# Patient Record
Sex: Female | Born: 1960 | Race: Black or African American | Hispanic: No | Marital: Single | Smoking: Current every day smoker
Health system: Southern US, Community
[De-identification: ages and names within clinical notes are randomized; demographics above are authoritative.]

## PROBLEM LIST (undated history)

## (undated) DIAGNOSIS — M069 Rheumatoid arthritis, unspecified: Secondary | ICD-10-CM

## (undated) DIAGNOSIS — M1990 Unspecified osteoarthritis, unspecified site: Secondary | ICD-10-CM

## (undated) DIAGNOSIS — K219 Gastro-esophageal reflux disease without esophagitis: Secondary | ICD-10-CM

## (undated) DIAGNOSIS — M549 Dorsalgia, unspecified: Secondary | ICD-10-CM

## (undated) DIAGNOSIS — J309 Allergic rhinitis, unspecified: Secondary | ICD-10-CM

## (undated) DIAGNOSIS — G8929 Other chronic pain: Secondary | ICD-10-CM

## (undated) HISTORY — PX: BACK SURGERY: SHX140

## (undated) HISTORY — PX: ABDOMINAL HYSTERECTOMY: SHX81

---

## 2016-02-02 ENCOUNTER — Encounter (HOSPITAL_BASED_OUTPATIENT_CLINIC_OR_DEPARTMENT_OTHER): Payer: Self-pay | Admitting: *Deleted

## 2016-02-02 ENCOUNTER — Emergency Department (HOSPITAL_BASED_OUTPATIENT_CLINIC_OR_DEPARTMENT_OTHER): Payer: Medicare Other

## 2016-02-02 ENCOUNTER — Emergency Department (HOSPITAL_BASED_OUTPATIENT_CLINIC_OR_DEPARTMENT_OTHER)
Admission: EM | Admit: 2016-02-02 | Discharge: 2016-02-02 | Disposition: A | Discharge status: Home / Self Care | Payer: Medicare Other | Attending: Emergency Medicine | Admitting: Emergency Medicine

## 2016-02-02 DIAGNOSIS — T402X5A Adverse effect of other opioids, initial encounter: Secondary | ICD-10-CM

## 2016-02-02 DIAGNOSIS — F17210 Nicotine dependence, cigarettes, uncomplicated: Secondary | ICD-10-CM | POA: Insufficient documentation

## 2016-02-02 DIAGNOSIS — K5909 Other constipation: Secondary | ICD-10-CM | POA: Insufficient documentation

## 2016-02-02 DIAGNOSIS — K5903 Drug induced constipation: Secondary | ICD-10-CM

## 2016-02-02 DIAGNOSIS — Z79899 Other long term (current) drug therapy: Secondary | ICD-10-CM | POA: Insufficient documentation

## 2016-02-02 HISTORY — DX: Dorsalgia, unspecified: M54.9

## 2016-02-02 HISTORY — DX: Unspecified osteoarthritis, unspecified site: M19.90

## 2016-02-02 HISTORY — DX: Other chronic pain: G89.29

## 2016-02-02 LAB — RAPID URINE DRUG SCREEN, HOSP PERFORMED
AMPHETAMINES: NOT DETECTED
Barbiturates: NOT DETECTED
Benzodiazepines: NOT DETECTED
Cocaine: NOT DETECTED
Opiates: POSITIVE — AB
TETRAHYDROCANNABINOL: NOT DETECTED

## 2016-02-02 LAB — COMPREHENSIVE METABOLIC PANEL
ALK PHOS: 107 U/L (ref 38–126)
ALT: 13 U/L — AB (ref 14–54)
AST: 20 U/L (ref 15–41)
Albumin: 4.2 g/dL (ref 3.5–5.0)
Anion gap: 8 (ref 5–15)
BUN: 9 mg/dL (ref 6–20)
CHLORIDE: 105 mmol/L (ref 101–111)
CO2: 24 mmol/L (ref 22–32)
CREATININE: 0.79 mg/dL (ref 0.44–1.00)
Calcium: 9.4 mg/dL (ref 8.9–10.3)
GFR calc Af Amer: >60 mL/min (ref >60)
Glucose, Bld: 136 mg/dL — ABNORMAL HIGH (ref 65–99)
Potassium: 3.8 mmol/L (ref 3.5–5.1)
Sodium: 137 mmol/L (ref 135–145)
Total Bilirubin: 0.4 mg/dL (ref 0.3–1.2)
Total Protein: 7.8 g/dL (ref 6.5–8.1)

## 2016-02-02 LAB — CBC WITH DIFFERENTIAL/PLATELET
BASOS ABS: 0 10*3/uL (ref 0.0–0.1)
Basophils Relative: 0 %
EOS PCT: 0 %
Eosinophils Absolute: 0 10*3/uL (ref 0.0–0.7)
HCT: 38.1 % (ref 36.0–46.0)
HEMOGLOBIN: 12.9 g/dL (ref 12.0–15.0)
LYMPHS PCT: 14 %
Lymphs Abs: 1.1 10*3/uL (ref 0.7–4.0)
MCH: 29.6 pg (ref 26.0–34.0)
MCHC: 33.9 g/dL (ref 30.0–36.0)
MCV: 87.4 fL (ref 78.0–100.0)
Monocytes Absolute: 0.3 10*3/uL (ref 0.1–1.0)
Monocytes Relative: 3 %
NEUTROS ABS: 6.8 10*3/uL (ref 1.7–7.7)
NEUTROS PCT: 83 %
PLATELETS: 244 10*3/uL (ref 150–400)
RBC: 4.36 MIL/uL (ref 3.87–5.11)
RDW: 13.3 % (ref 11.5–15.5)
WBC: 8.2 10*3/uL (ref 4.0–10.5)

## 2016-02-02 LAB — URINALYSIS, ROUTINE W REFLEX MICROSCOPIC
Bilirubin Urine: NEGATIVE
GLUCOSE, UA: NEGATIVE mg/dL
HGB URINE DIPSTICK: NEGATIVE
Ketones, ur: NEGATIVE mg/dL
Leukocytes, UA: NEGATIVE
Nitrite: NEGATIVE
Protein, ur: NEGATIVE mg/dL
SPECIFIC GRAVITY, URINE: 1.016 (ref 1.005–1.030)
pH: 6 (ref 5.0–8.0)

## 2016-02-02 LAB — LIPASE, BLOOD: LIPASE: 23 U/L (ref 11–51)

## 2016-02-02 MED ORDER — ONDANSETRON 8 MG PO TBDP: ORAL_TABLET | ORAL | 0 refills | Status: DC

## 2016-02-02 MED ORDER — DICYCLOMINE HCL 10 MG/ML IM SOLN
20.0000 mg | Freq: Once | INTRAMUSCULAR | Status: AC
  Administered 2016-02-02: 20 mg via INTRAMUSCULAR
  Filled 2016-02-02: qty 2

## 2016-02-02 MED ORDER — ONDANSETRON HCL 4 MG/2ML IJ SOLN
4.0000 mg | Freq: Once | INTRAMUSCULAR | Status: AC
  Administered 2016-02-02: 4 mg via INTRAVENOUS
  Filled 2016-02-02: qty 2

## 2016-02-02 MED ORDER — HALOPERIDOL LACTATE 5 MG/ML IJ SOLN
2.0000 mg | Freq: Once | INTRAMUSCULAR | Status: AC
  Administered 2016-02-02: 2 mg via INTRAVENOUS
  Filled 2016-02-02: qty 1

## 2016-02-02 NOTE — ED Triage Notes (Signed)
Pt not cooperating due to N/V in triage.  Reports abdominal pain x 8 hours.  Hx of constipation-states LBM today reports pain started after that.

## 2016-02-02 NOTE — ED Notes (Signed)
ED Provider at bedside. 

## 2016-02-02 NOTE — ED Notes (Addendum)
C/o abd pain x 8 hours,w n/v  States was constipated , daughter did disimpaction and that was when abd started hurting

## 2016-02-02 NOTE — ED Provider Notes (Signed)
[facility]   CSN: [id number] Arrival date & time: 02/02/16  0254     History   Chief Complaint Chief Complaint  Patient presents with  . Abdominal Pain    HPI Kristen Ramos is a 55 y.o. female.  The history is provided by a relative.  Abdominal Pain   This is a new problem. The current episode started 6 to 12 hours ago. The problem occurs constantly. The problem has not changed since onset.Associated with: constipation.  Daughter manually disempacted her. Associated symptoms include nausea, vomiting and constipation. Pertinent negatives include fever. Exacerbated by: post BM. Nothing relieves the symptoms. Past workup does not include surgery. Her past medical history does not include ulcerative colitis.    Past Medical History:  Diagnosis Date  . Arthritis   . Chronic back pain     There are no active problems to display for this patient.   Past Surgical History:  Procedure Laterality Date  . ABDOMINAL HYSTERECTOMY    . BACK SURGERY    . CESAREAN SECTION      OB History    No data available       Home Medications    Prior to Admission medications   Medication Sig Start Date End Date Taking? Authorizing Provider  esomeprazole (NEXIUM) 40 MG capsule Take 40 mg by mouth daily at 12 noon.   Yes Historical Provider, MD  oxyCODONE-acetaminophen (PERCOCET) 7.5-325 MG tablet Take 1 tablet by mouth every 6 (six) hours as needed for severe pain.   Yes Historical Provider, MD    Family History History reviewed. No pertinent family history.  Social History Social History  Substance Use Topics  . Smoking status: Current Every Day Smoker    Types: Cigarettes  . Smokeless tobacco: Never Used  . Alcohol use No     Allergies   Patient has no known allergies.   Review of Systems Review of Systems  Constitutional: Negative for fever.  Gastrointestinal: Positive for abdominal pain, constipation, nausea and vomiting. Negative for anal  bleeding.  All other systems reviewed and are negative.    Physical Exam Updated Vital Signs BP 142/69 (BP Location: Right Arm)   Pulse 74   Temp 97.6 F (36.4 C) (Oral)   Resp 20   Ht 5\' 6"  (1.676 m)   Wt 185 lb (83.9 kg)   SpO2 98%   BMI 29.86 kg/m   Physical Exam  Constitutional: She appears well-developed and well-nourished.  HENT:  Head: Normocephalic and atraumatic.  Eyes: Conjunctivae are normal.  Neck: Normal range of motion. Neck supple. No tracheal deviation present.  Cardiovascular: Normal rate, regular rhythm and intact distal pulses.   Pulmonary/Chest: Effort normal and breath sounds normal. She has no wheezes. She has no rales.  Abdominal: Soft. Bowel sounds are normal. She exhibits no mass. There is no hepatosplenomegaly. There is no tenderness. There is no rigidity, no rebound, no guarding, no tenderness at McBurney's point and negative Murphy's sign.  Musculoskeletal: Normal range of motion.  Neurological: She is alert. She displays normal reflexes.  Skin: Skin is warm and dry. Capillary refill takes less than 2 seconds.     ED Treatments / Results  Labs (all labs ordered are listed, but only abnormal results are displayed) Labs Reviewed  COMPREHENSIVE METABOLIC PANEL - Abnormal; Notable for the following:       Result Value   Glucose, Bld 136 (*)    ALT 13 (*)    All other components within  normal limits  RAPID URINE DRUG SCREEN, HOSP PERFORMED - Abnormal; Notable for the following:    Opiates POSITIVE (*)    All other components within normal limits  CBC WITH DIFFERENTIAL/PLATELET  LIPASE, BLOOD  URINALYSIS, ROUTINE W REFLEX MICROSCOPIC (NOT AT [facility])    EKG  EKG Interpretation None       Radiology Dg Abdomen Acute W/chest  Result Date: 02/02/2016 CLINICAL DATA:  Abdominal pain, nausea and vomiting. Pain for 8 hours. History of constipation. EXAM: DG ABDOMEN ACUTE W/ 1V CHEST COMPARISON:  None. FINDINGS: Normal heart size and pulmonary  vascularity. No focal airspace disease or consolidation in the lungs. No blunting of costophrenic angles. No pneumothorax. Mediastinal contours appear intact. Postoperative changes in the cervical spine. Gas and stool throughout the colon. No small or large bowel distention. No free intra-abdominal air. No abnormal air-fluid levels. Postoperative changes in the lumbar spine. No radiopaque stones. IMPRESSION: No evidence of active pulmonary disease. Nonobstructive bowel gas pattern. Electronically Signed   By: Burman Nieves M.D.   On: 02/02/2016 04:33    Procedures Procedures (including critical care time)  Medications Ordered in ED Medications  dicyclomine (BENTYL) injection 20 mg (not administered)  haloperidol lactate (HALDOL) injection 2 mg (not administered)  ondansetron (ZOFRAN) injection 4 mg (4 mg Intravenous Given 02/02/16 0330)     The pain started after the daughter manually dissempacted the long lying hard stool.  I suspect the majority of the pain is secondary to spasm in the rectum as the patient will not lay on her back.  She is now resting comfortably post medication. Final Clinical Impressions(s) / ED Diagnoses  Exam and xray are consistent with opioid induced constipation:  Miralax BID x 7 days then daily.  May use a glycerin suppository to lubricate bottom. Liquid diet only x 3 days.  Strict abdominal pain return precautions given.   All questions answered to patient's satisfaction. Based on history and exam patient has been appropriately medically screened and emergency conditions excluded. Patient is stable for discharge at this time. Follow up with your PMD for recheck in 2 days and strict return precautions given.     Cy Blamer, MD 02/02/16 [phone]

## 2016-11-22 ENCOUNTER — Emergency Department: Payer: Self-pay

## 2016-11-22 ENCOUNTER — Emergency Department
Admission: EM | Admit: 2016-11-22 | Discharge: 2016-11-23 | Disposition: A | Discharge status: Home / Self Care | Payer: Self-pay | Attending: Family Medicine | Admitting: Family Medicine

## 2016-11-22 DIAGNOSIS — R1084 Generalized abdominal pain: Secondary | ICD-10-CM | POA: Insufficient documentation

## 2016-11-22 DIAGNOSIS — R51 Headache: Secondary | ICD-10-CM | POA: Insufficient documentation

## 2016-11-22 HISTORY — DX: Unspecified osteoarthritis, unspecified site: M19.90

## 2016-11-22 LAB — CBC AND DIFFERENTIAL
Bands: 1 % (ref 0–10)
Basophils Absolute: 0 10*3/uL (ref 0.0–0.3)
Eosinophils Absolute: 0 10*3/uL (ref 0.0–0.8)
Hematocrit: 42.1 % (ref 36.0–48.0)
Hemoglobin: 14.2 gm/dL (ref 12.0–16.0)
Lymphocytes Absolute: 1.1 10*3/uL (ref 0.6–5.1)
Lymphocytes: 14 % — ABNORMAL LOW (ref 15.0–46.0)
MCH: 31 pg (ref 28–35)
MCHC: 34 gm/dL (ref 31–36)
MCV: 91 fL (ref 80–100)
MPV: 6.9 fL (ref 6.0–10.0)
Monocytes Absolute: 0.3 10*3/uL (ref 0.1–1.7)
Monocytes: 4 % (ref 3.0–15.0)
Neutrophils %: 81 % — ABNORMAL HIGH (ref 42.0–78.0)
Neutrophils Absolute: 6.7 10*3/uL (ref 1.7–8.6)
PLT CT: 304 10*3/uL (ref 130–440)
RBC: 4.64 10*6/uL (ref 3.80–5.00)
RDW: 12.1 % (ref 10.5–14.5)
WBC: 8.2 10*3/uL (ref 4.0–11.0)

## 2016-11-22 LAB — COMPREHENSIVE METABOLIC PANEL
ALT: 17 U/L (ref 0–55)
AST (SGOT): 14 U/L (ref 10–42)
Albumin/Globulin Ratio: 1.06 Ratio (ref 0.70–1.50)
Albumin: 4.2 gm/dL (ref 3.5–5.0)
Alkaline Phosphatase: 94 U/L (ref 40–145)
Anion Gap: 15.9 mMol/L (ref 7.0–18.0)
BUN / Creatinine Ratio: 11.3 Ratio (ref 10.0–30.0)
BUN: 9 mg/dL (ref 7–22)
Bilirubin, Total: 0.3 mg/dL (ref 0.1–1.2)
CO2: 23.9 mMol/L (ref 20.0–30.0)
Calcium: 9.6 mg/dL (ref 8.5–10.5)
Chloride: 104 mMol/L (ref 98–110)
Creatinine: 0.8 mg/dL (ref 0.60–1.20)
EGFR: 97 mL/min/{1.73_m2} (ref 60–150)
Globulin: 3.9 gm/dL (ref 2.0–4.0)
Glucose: 143 mg/dL — ABNORMAL HIGH (ref 71–99)
Osmolality Calc: 281 mOsm/kg (ref 275–300)
Potassium: 3.7 mMol/L (ref 3.5–5.3)
Protein, Total: 8.1 gm/dL (ref 6.0–8.3)
Sodium: 140 mMol/L (ref 136–147)

## 2016-11-22 LAB — TROPONIN I: Troponin I: <0.01 ng/mL (ref 0.00–0.02)

## 2016-11-22 LAB — LIPASE: Lipase: 19 U/L (ref 8–78)

## 2016-11-22 LAB — HCG, SERUM, QUALITATIVE: BHCG Qualitative: NEGATIVE

## 2016-11-22 MED ORDER — ONDANSETRON HCL 8 MG PO TABS
8.0000 mg | ORAL_TABLET | Freq: Four times a day (QID) | ORAL | 0 refills | Status: AC | PRN
Start: 2016-11-22 — End: ?

## 2016-11-22 MED ORDER — ONDANSETRON HCL 4 MG/2ML IJ SOLN
4.0000 mg | Freq: Once | INTRAMUSCULAR | Status: AC
Start: 2016-11-22 — End: 2016-11-22
  Administered 2016-11-22: 11:00:00 4 mg via INTRAVENOUS

## 2016-11-22 MED ORDER — ONDANSETRON HCL 4 MG/2ML IJ SOLN
4.0000 mg | Freq: Once | INTRAMUSCULAR | Status: AC
Start: 2016-11-22 — End: 2016-11-22
  Administered 2016-11-22: 13:00:00 4 mg via INTRAVENOUS

## 2016-11-22 MED ORDER — SODIUM CHLORIDE 0.9 % IV SOLN
INTRAVENOUS | Status: DC
Start: 2016-11-22 — End: 2016-11-23

## 2016-11-22 MED ORDER — IOHEXOL 350 MG/ML IV SOLN
100.0000 mL | Freq: Once | INTRAVENOUS | Status: DC | PRN
Start: 2016-11-22 — End: 2016-11-23

## 2016-11-22 MED ORDER — ONDANSETRON 4 MG PO TBDP
4.0000 mg | ORAL_TABLET | Freq: Once | ORAL | Status: AC
Start: 2016-11-22 — End: 2016-11-22
  Administered 2016-11-22: 14:00:00 4 mg via ORAL

## 2016-11-22 MED ORDER — ONDANSETRON 4 MG PO TBDP
ORAL_TABLET | ORAL | Status: AC
Start: 2016-11-22 — End: ?
  Filled 2016-11-22: qty 1

## 2016-11-22 MED ORDER — VH HYDROMORPHONE HCL 1 MG/ML (NARRATOR)
INTRAMUSCULAR | Status: AC
Start: 2016-11-22 — End: ?
  Filled 2016-11-22: qty 1

## 2016-11-22 MED ORDER — IBUPROFEN 600 MG PO TABS
600.0000 mg | ORAL_TABLET | Freq: Four times a day (QID) | ORAL | 0 refills | Status: AC | PRN
Start: 2016-11-22 — End: ?

## 2016-11-22 MED ORDER — ONDANSETRON HCL 4 MG/2ML IJ SOLN
INTRAMUSCULAR | Status: AC
Start: 2016-11-22 — End: ?
  Filled 2016-11-22: qty 2

## 2016-11-22 MED ORDER — VH HYDROMORPHONE HCL PF 1 MG/ML CARPUJECT
1.0000 mg | Freq: Once | INTRAMUSCULAR | Status: AC
Start: 2016-11-22 — End: 2016-11-22
  Administered 2016-11-22: 13:00:00 1 mg via INTRAVENOUS

## 2016-11-22 NOTE — ED Notes (Signed)
Unable to obtain CT of abdomen with contrast. IV not working/flushing. Dr. Allyson Sabal notified. Patient returned back to room. Attempting obtain new IV access. Will continue to monitor.

## 2016-11-22 NOTE — ED Notes (Signed)
Patient to CT scan at this time via bed and rad tech.

## 2016-11-22 NOTE — ED Notes (Signed)
Patient discharged and signed discharge paperwork. Allowing patient and granddaughter to stay in room until ride arrives, as rooms are available. Will continue to monitor.

## 2016-11-22 NOTE — ED Notes (Signed)
Patient does not provide details answers to questions, only yes or no at this time. Pt moving around bed, side to side, moving supine to prone on stretcher, sitting over end of stretcher at times. Instructed patient to stay on stretcher due to fall risk. Both side rails up at this time due to safety. Call bell within reach. Will continue to monitor.

## 2016-11-22 NOTE — ED Notes (Signed)
Bed: ED7-A  Expected date:   Expected time:   Means of arrival:   Comments:  EMS [facility]

## 2016-11-22 NOTE — ED Notes (Signed)
Warm blanket provided to patient and her granddaughter. Call bell within reach. Emesis bag at bedside. Will continue to monitor.
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
## 2016-11-22 NOTE — ED Notes (Signed)
Patient resting with eyes closed. Breathing even and unlabored. Will continue to monitor.
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
## 2016-11-22 NOTE — ED Notes (Signed)
Patient requesting one more dose of zofran before discharge. Orders placed. Will medicate.

## 2016-11-22 NOTE — ED Notes (Signed)
Patient to CT scan at this time via bed and rad tech.

## 2016-11-22 NOTE — ED Notes (Signed)
Patient's family to arrive in 2 hours.

## 2016-11-22 NOTE — ED Notes (Signed)
Patient refuses to dress in gown due to abdominal pain. Patient placed on cardiac monitor. Will continue to monitor.

## 2016-11-22 NOTE — ED Notes (Signed)
Patient and her granddaughter awaiting ride from [place] [employer]. Patient does not want to wait in waiting room, as she is still feeling nauseous. Will continue to monitor.

## 2016-11-22 NOTE — ED Notes (Signed)
IV attempted twice, by 2 different nurses, unsuccessful. PA at bedside at this time with ultrasound to obtain IV and labs.

## 2016-11-22 NOTE — ED Notes (Signed)
Patient's granddaughter at bedside. Awaiting her mother and aunt to come from [place] to retrieve her. Boxed lunch and blanket provided. Will continue to monitor.
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
## 2016-11-23 NOTE — ED Notes (Signed)
Family here for pt .

## 2016-11-24 LAB — ECG 12-LEAD
P Wave Axis: 59 deg
P-R Interval: 141 ms
Patient Age: 56 years
Q-T Interval(Corrected): 472 ms
Q-T Interval: 437 ms
QRS Axis: 44 deg
QRS Duration: 99 ms
T Axis: 37 years
Ventricular Rate: 70 //min

## 2018-02-11 ENCOUNTER — Emergency Department (HOSPITAL_COMMUNITY): Payer: Medicare Other

## 2018-02-11 ENCOUNTER — Emergency Department (HOSPITAL_COMMUNITY)
Admission: EM | Admit: 2018-02-11 | Discharge: 2018-02-12 | Disposition: A | Discharge status: Home / Self Care | Payer: Medicare Other | Attending: Internal Medicine | Admitting: Internal Medicine

## 2018-02-11 DIAGNOSIS — F17210 Nicotine dependence, cigarettes, uncomplicated: Secondary | ICD-10-CM | POA: Diagnosis not present

## 2018-02-11 DIAGNOSIS — M0520 Rheumatoid vasculitis with rheumatoid arthritis of unspecified site: Secondary | ICD-10-CM | POA: Diagnosis present

## 2018-02-11 DIAGNOSIS — R112 Nausea with vomiting, unspecified: Secondary | ICD-10-CM | POA: Diagnosis not present

## 2018-02-11 DIAGNOSIS — R1013 Epigastric pain: Secondary | ICD-10-CM | POA: Diagnosis not present

## 2018-02-11 DIAGNOSIS — Z79899 Other long term (current) drug therapy: Secondary | ICD-10-CM | POA: Diagnosis not present

## 2018-02-11 DIAGNOSIS — K2900 Acute gastritis without bleeding: Secondary | ICD-10-CM | POA: Diagnosis not present

## 2018-02-11 DIAGNOSIS — R079 Chest pain, unspecified: Secondary | ICD-10-CM | POA: Insufficient documentation

## 2018-02-11 DIAGNOSIS — M069 Rheumatoid arthritis, unspecified: Secondary | ICD-10-CM | POA: Diagnosis present

## 2018-02-11 DIAGNOSIS — K529 Noninfective gastroenteritis and colitis, unspecified: Secondary | ICD-10-CM | POA: Diagnosis present

## 2018-02-11 DIAGNOSIS — R109 Unspecified abdominal pain: Secondary | ICD-10-CM

## 2018-02-11 LAB — COMPREHENSIVE METABOLIC PANEL WITH GFR
ALT: 22 U/L (ref 0–44)
AST: 25 U/L (ref 15–41)
Albumin: 4.1 g/dL (ref 3.5–5.0)
Alkaline Phosphatase: 78 U/L (ref 38–126)
Anion gap: 11 (ref 5–15)
BUN: 9 mg/dL (ref 6–20)
CO2: 22 mmol/L (ref 22–32)
Calcium: 9.5 mg/dL (ref 8.9–10.3)
Chloride: 105 mmol/L (ref 98–111)
Creatinine, Ser: 0.82 mg/dL (ref 0.44–1.00)
GFR calc Af Amer: >60 mL/min
GFR calc non Af Amer: >60 mL/min
Glucose, Bld: 147 mg/dL — ABNORMAL HIGH (ref 70–99)
Potassium: 3.7 mmol/L (ref 3.5–5.1)
Sodium: 138 mmol/L (ref 135–145)
Total Bilirubin: 0.8 mg/dL (ref 0.3–1.2)
Total Protein: 7.8 g/dL (ref 6.5–8.1)

## 2018-02-11 LAB — I-STAT TROPONIN, ED: Troponin i, poc: 0 ng/mL (ref 0.00–0.08)

## 2018-02-11 LAB — CBC
HCT: 40.3 % (ref 36.0–46.0)
Hemoglobin: 13.4 g/dL (ref 12.0–15.0)
MCH: 29.6 pg (ref 26.0–34.0)
MCHC: 33.3 g/dL (ref 30.0–36.0)
MCV: 89 fL (ref 80.0–100.0)
Platelets: 283 10*3/uL (ref 150–400)
RBC: 4.53 MIL/uL (ref 3.87–5.11)
RDW: 12.6 % (ref 11.5–15.5)
WBC: 6.4 10*3/uL (ref 4.0–10.5)
nRBC: 0 % (ref 0.0–0.2)

## 2018-02-11 LAB — LIPASE, BLOOD: Lipase: 26 U/L (ref 11–51)

## 2018-02-11 LAB — I-STAT BETA HCG BLOOD, ED (MC, WL, AP ONLY)

## 2018-02-11 MED ORDER — ONDANSETRON HCL 4 MG PO TABS: 4.0000 mg | ORAL_TABLET | Freq: Four times a day (QID) | ORAL | 0 refills | Status: DC

## 2018-02-11 MED ORDER — FAMOTIDINE IN NACL 20-0.9 MG/50ML-% IV SOLN
20.0000 mg | Freq: Once | INTRAVENOUS | Status: AC
  Administered 2018-02-11: 20 mg via INTRAVENOUS
  Filled 2018-02-11: qty 50

## 2018-02-11 MED ORDER — SODIUM CHLORIDE 0.9 % IV SOLN
Freq: Once | INTRAVENOUS | Status: AC
  Administered 2018-02-12: 02:00:00 via INTRAVENOUS

## 2018-02-11 MED ORDER — SODIUM CHLORIDE 0.9 % IV BOLUS
1000.0000 mL | Freq: Once | INTRAVENOUS | Status: AC
  Administered 2018-02-11: 1000 mL via INTRAVENOUS

## 2018-02-11 MED ORDER — FENTANYL CITRATE (PF) 100 MCG/2ML IJ SOLN
50.0000 ug | Freq: Once | INTRAMUSCULAR | Status: AC
  Administered 2018-02-11: 50 ug via INTRAVENOUS
  Filled 2018-02-11: qty 2

## 2018-02-11 MED ORDER — ONDANSETRON HCL 4 MG/2ML IJ SOLN
4.0000 mg | Freq: Once | INTRAMUSCULAR | Status: AC
  Administered 2018-02-11: 4 mg via INTRAVENOUS
  Filled 2018-02-11: qty 2

## 2018-02-11 MED ORDER — FENTANYL CITRATE (PF) 100 MCG/2ML IJ SOLN
50.0000 ug | Freq: Once | INTRAMUSCULAR | Status: AC
  Administered 2018-02-12: 50 ug via INTRAVENOUS
  Filled 2018-02-11: qty 2

## 2018-02-11 MED ORDER — PROMETHAZINE HCL 25 MG/ML IJ SOLN
12.5000 mg | Freq: Once | INTRAMUSCULAR | Status: AC
  Administered 2018-02-12: 12.5 mg via INTRAVENOUS
  Filled 2018-02-11: qty 1

## 2018-02-11 MED ORDER — PANTOPRAZOLE SODIUM 40 MG PO TBEC: 40.0000 mg | DELAYED_RELEASE_TABLET | Freq: Every day | ORAL | 0 refills | Status: DC

## 2018-02-11 NOTE — ED Triage Notes (Addendum)
Per EMS, pt started having abdominal pain yesterday and centralized chest pain today.  Refused ASA and Nitro , took 4 zofran IV.  Pt not helpful w/ information in triage.

## 2018-02-11 NOTE — ED Notes (Signed)
Radiology will get films when patient sees an MD

## 2018-02-11 NOTE — Discharge Instructions (Addendum)

## 2018-02-11 NOTE — ED Notes (Addendum)
Per xray, not able to do tests d/t pt rolling around and would not stay still

## 2018-02-11 NOTE — ED Notes (Signed)
ED Provider at bedside. 

## 2018-02-12 ENCOUNTER — Emergency Department (HOSPITAL_COMMUNITY): Payer: Medicare Other

## 2018-02-12 DIAGNOSIS — M069 Rheumatoid arthritis, unspecified: Secondary | ICD-10-CM | POA: Diagnosis present

## 2018-02-12 DIAGNOSIS — R1013 Epigastric pain: Secondary | ICD-10-CM | POA: Diagnosis present

## 2018-02-12 DIAGNOSIS — M0520 Rheumatoid vasculitis with rheumatoid arthritis of unspecified site: Secondary | ICD-10-CM | POA: Diagnosis present

## 2018-02-12 DIAGNOSIS — K529 Noninfective gastroenteritis and colitis, unspecified: Secondary | ICD-10-CM | POA: Diagnosis present

## 2018-02-12 DIAGNOSIS — R112 Nausea with vomiting, unspecified: Secondary | ICD-10-CM | POA: Diagnosis present

## 2018-02-12 LAB — URINALYSIS, ROUTINE W REFLEX MICROSCOPIC
Bacteria, UA: NONE SEEN
Bilirubin Urine: NEGATIVE
GLUCOSE, UA: NEGATIVE mg/dL
KETONES UR: 20 mg/dL — AB
Nitrite: NEGATIVE
PH: 7 (ref 5.0–8.0)
Protein, ur: NEGATIVE mg/dL
Specific Gravity, Urine: 1.028 (ref 1.005–1.030)

## 2018-02-12 LAB — RAPID URINE DRUG SCREEN, HOSP PERFORMED
Amphetamines: NOT DETECTED
BARBITURATES: NOT DETECTED
Benzodiazepines: NOT DETECTED
Cocaine: NOT DETECTED
Opiates: NOT DETECTED
Tetrahydrocannabinol: NOT DETECTED

## 2018-02-12 MED ORDER — IOHEXOL 300 MG/ML  SOLN
100.0000 mL | Freq: Once | INTRAMUSCULAR | Status: AC | PRN
  Administered 2018-02-12: 100 mL via INTRAVENOUS

## 2018-02-12 MED ORDER — OXYCODONE HCL 5 MG PO TABS: 15.0000 mg | ORAL_TABLET | Freq: Four times a day (QID) | ORAL | Status: DC | PRN

## 2018-02-12 MED ORDER — ONDANSETRON 4 MG PO TBDP: ORAL_TABLET | ORAL | 0 refills | Status: DC

## 2018-02-12 MED ORDER — TOFACITINIB CITRATE ER 11 MG PO TB24: 11.0000 mg | ORAL_TABLET | Freq: Every day | ORAL | Status: DC

## 2018-02-12 MED ORDER — HALOPERIDOL LACTATE 5 MG/ML IJ SOLN
2.0000 mg | Freq: Once | INTRAMUSCULAR | Status: AC
  Administered 2018-02-12: 2 mg via INTRAVENOUS
  Filled 2018-02-12: qty 1

## 2018-02-12 MED ORDER — SODIUM CHLORIDE 0.9 % IV SOLN: INTRAVENOUS | Status: DC

## 2018-02-12 MED ORDER — ONDANSETRON HCL 4 MG/2ML IJ SOLN: 4.0000 mg | Freq: Four times a day (QID) | INTRAMUSCULAR | Status: DC | PRN

## 2018-02-12 MED ORDER — METOCLOPRAMIDE HCL 5 MG/ML IJ SOLN
5.0000 mg | Freq: Three times a day (TID) | INTRAMUSCULAR | Status: DC
  Administered 2018-02-12: 5 mg via INTRAVENOUS
  Filled 2018-02-12: qty 2

## 2018-02-12 NOTE — ED Notes (Signed)
Discharge instructions reviewed with pt. Pt called friend to come get her. Pt verbalized understanding of discharge paperwork. Pt rolled waiting room to wait for ride.

## 2018-02-12 NOTE — ED Notes (Signed)
Patient given PO fluids. Tolerating well at this time.

## 2018-02-12 NOTE — ED Notes (Signed)
Patient vomited clear fluid x 1.

## 2018-02-12 NOTE — ED Notes (Signed)
Patient transported to CT 

## 2018-02-12 NOTE — ED Notes (Signed)
Per Dr.Dhungel,if pt can tolerate soft diet then pt can be discharged.

## 2018-02-12 NOTE — Discharge Summary (Signed)
Physician Discharge Summary  Breawna Strohschein PYP:[id number] DOB: 04-06-1960 DOA: 02/11/2018  PCP: Patient, No Pcp Per  Admit date: 02/11/2018 Discharge date: 02/12/2018  Admitted From: Home Disposition: Home  Recommendations for Outpatient Follow-up:  1. Follow up with PCP and rheumatologist as outpatient.  Home Health: None Equipment/Devices: None  Discharge Condition: Fair CODE STATUS: Full code Diet recommendation: Regular    Discharge Diagnoses:  Principal Problem:   Acute viral gastroenteritis  Active Problems:   Rheumatoid arteritis (HCC)   Intractable nausea and vomiting   Epigastric abdominal pain  Brief narrative/HPI Please refer to admission H&P from earlier this morning for details, 57 year old female with rheumatoid arthritis, chronic back pain on narcotics presented with nausea, vomiting and periumbilical abdominal pain since 1 day duration.  Denies eating anything bad outside or any sick contact, recent travel.  Reports several episodes of nonbloody, nonbilious vomiting and unable to keep anything down.  She reported having subjective fevers and malaise to the admitting hospitalist but denied having any fevers to me this morning.  Denies any headache, chest pain, shortness of breath, diarrhea, dysuria, weakness or numbness of the extremities.  EMS gave her Zofran and brought to the ED.  The ED vitals were stable.  Blood work was unremarkable.  CT scan of the abdomen showed mild biliary ductal dilatation without choledocholithiasis.  Ultrasound of the abdomen again showed dilation of the CBD without any stone.  Received 3 L normal saline bolus, Phenergan, Zofran and fentanyl with minimal relief.  Patient placed on observation for persistent nausea and vomiting.  Hospital course Acute viral gastroenteritis Patient has been hydrated with IV fluids.  No further nausea or vomiting and abdominal pain has resolved.  Labs unremarkable.  Placed her on soft diet and tolerating  well. Stable to be discharged home.  Prescribed some Zofran for as needed use and instructed to follow-up with her PCP and rheumatologist.  Dilated CBD Chronic and seen on prior imaging from 2019.  No right upper quadrant tenderness or abnormal LFTs.  Rheumatoid arthritis Currently on tofacitenib and oxycodone.  Patient reports that she has not run out of her narcotics and has been taking them as prescribed at home.   Procedure: CT abdomen, ultrasound abdomen Family communication: None at bedside  Disposition: Home  Discharge Instructions   Allergies as of 02/12/2018      Reactions   Bee Venom Swelling      Medication List    TAKE these medications   ondansetron 4 MG disintegrating tablet Commonly known as:  ZOFRAN-ODT 8mg  ODT q8 hours prn nausea What changed:  medication strength   oxyCODONE 15 MG immediate release tablet Commonly known as:  ROXICODONE Take 15 mg by mouth 4 (four) times daily as needed.   Tofacitinib Citrate 11 MG Tb24 Take 11 mg by mouth daily.       Allergies  Allergen Reactions  . Bee Venom Swelling      Procedures/Studies: Ct Abdomen Pelvis W Contrast  Addendum Date: 02/12/2018   ADDENDUM REPORT: 02/12/2018 01:24 ADDENDUM: RIGHT upper quadrant ultrasound may be of added value. Electronically Signed   By: Awilda Metro M.D.   On: 02/12/2018 01:24   Result Date: 02/12/2018 CLINICAL DATA:  Upper abdominal pain and vomiting. History of hysterectomy and spine surgery. EXAM: CT ABDOMEN AND PELVIS WITH CONTRAST TECHNIQUE: Multidetector CT imaging of the abdomen and pelvis was performed using the standard protocol following bolus administration of intravenous contrast. CONTRAST:  OMNIPAQUE IOHEXOL 300 MG/ML  SOLN COMPARISON:  Abdominal series February 02, 2016 FINDINGS: LOWER CHEST: Lung bases are clear. Included heart size is normal. No pericardial effusion. HEPATOBILIARY: Mild intrahepatic biliary dilatation noted within the LEFT lobe of  the liver. Liver is otherwise unremarkable. Normal gallbladder. Dilated 14 mm common bile duct without choledocholithiasis, taper distally. PANCREAS: Normal. SPLEEN: Normal. ADRENALS/URINARY TRACT: Kidneys are orthotopic, demonstrating symmetric enhancement. No nephrolithiasis, hydronephrosis or solid renal masses. The unopacified ureters are normal in course and caliber. Delayed imaging through the kidneys demonstrates symmetric prompt contrast excretion within the proximal urinary collecting system. Distended urinary bladder to the level of the umbilicus, otherwise unremarkable. Thickened LEFT adrenal gland seen with hyperplasia. STOMACH/BOWEL: Small hiatal hernia. The small and large bowel are normal in course and caliber without inflammatory changes. Mild colonic diverticulosis. Normal appendix. VASCULAR/LYMPHATIC: Aortoiliac vessels are normal in course and caliber. Mild aortoiliac calcific atherosclerosis. No lymphadenopathy by CT size criteria. REPRODUCTIVE: Status post hysterectomy. OTHER: No intraperitoneal free fluid or free air. Small fat containing umbilical hernia. MUSCULOSKELETAL: Nonacute. L3-4 PLIF, L4-5 and L5-S1 disc prosthesis and posterior decompression. Mild L2-3 adjacent segment disease. IMPRESSION: 1. Mild biliary dilatation without choledocholithiasis. Obstructing ampullary mass less likely though, recommend non emergent MRCP and correlation with liver function tests. 2. Colonic diverticulosis without acute diverticulitis. 3. Distended urinary bladder, recommend correlation with voiding. Aortic Atherosclerosis (ICD10-I70.0). Electronically Signed: By: Awilda Metro M.D. On: 02/12/2018 01:18   Dg Chest Portable 1 View  Result Date: 02/11/2018 CLINICAL DATA:  57 year old female with chest pain. EXAM: PORTABLE CHEST 1 VIEW COMPARISON:  Chest radiograph dated 02/02/2016 FINDINGS: The lungs are clear. There is no pleural effusion or pneumothorax. Borderline cardiomegaly. No acute osseous  pathology. Partially visualized cervical fixation hardware. IMPRESSION: No active disease. Electronically Signed   By: Elgie Collard M.D.   On: 02/11/2018 22:38   US Abdomen Limited Ruq  Result Date: 02/12/2018 CLINICAL DATA:  57 year old female with abdominal pain. EXAM: ULTRASOUND ABDOMEN LIMITED RIGHT UPPER QUADRANT COMPARISON:  CT dated 02/12/2018 FINDINGS: Gallbladder: There is no gallstone, gallbladder wall thickening, or pericholecystic fluid. Negative sonographic Murphy's sign. There are areas of adenomyomatosis of the anterior gallbladder wall. Common bile duct: Diameter: 9 mm Liver: The liver is unremarkable as visualized. Portal vein is patent on color Doppler imaging with normal direction of blood flow towards the liver. IMPRESSION: 1. No gallstones or sonographic findings of acute cholecystitis. 2. Dilatation of the common bile duct as seen on the earlier CT. Electronically Signed   By: Elgie Collard M.D.   On: 02/12/2018 02:37       Subjective: Denies further nausea or vomiting.  Abdominal pain has resolved.  Wants to go home.  Discharge Exam: Vitals:   02/12/18 0357 02/12/18 0603  BP: 129/61 (!) 136/54  Pulse: 94 70  Resp: 15 15  Temp:    SpO2: 100% 100%   Vitals:   02/12/18 0130 02/12/18 0145 02/12/18 0357 02/12/18 0603  BP: 134/61 (!) 151/86 129/61 (!) 136/54  Pulse: 74 76 94 70  Resp: 16 18 15 15   Temp:      TempSrc:      SpO2: 99% 100% 100% 100%    General: Middle-aged female not in distress HEENT: Moist mucosa, supple neck Chest: Clear bilaterally CVs: Normal S1 and S2, no murmurs GI: Soft, nondistended, nontender, bowel sounds present Musculoskeletal: Warm, no edema    The results of significant diagnostics from this hospitalization (including imaging, microbiology, ancillary and laboratory) are listed below for reference.     Microbiology: No  results found for this or any previous visit (from the past 240 hour(s)).   Labs: BNP (last 3  results) No results for input(s): BNP in the last 8760 hours. Basic Metabolic Panel: Recent Labs  Lab 02/11/18 1936  NA 138  K 3.7  CL 105  CO2 22  GLUCOSE 147*  BUN 9  CREATININE 0.82  CALCIUM 9.5   Liver Function Tests: Recent Labs  Lab 02/11/18 1936  AST 25  ALT 22  ALKPHOS 78  BILITOT 0.8  PROT 7.8  ALBUMIN 4.1   Recent Labs  Lab 02/11/18 1936  LIPASE 26   No results for input(s): AMMONIA in the last 168 hours. CBC: Recent Labs  Lab 02/11/18 1936  WBC 6.4  HGB 13.4  HCT 40.3  MCV 89.0  PLT 283   Cardiac Enzymes: No results for input(s): CKTOTAL, CKMB, CKMBINDEX, TROPONINI in the last 168 hours. BNP: Invalid input(s): POCBNP CBG: No results for input(s): GLUCAP in the last 168 hours. D-Dimer No results for input(s): DDIMER in the last 72 hours. Hgb A1c No results for input(s): HGBA1C in the last 72 hours. Lipid Profile No results for input(s): CHOL, HDL, LDLCALC, TRIG, CHOLHDL, LDLDIRECT in the last 72 hours. Thyroid function studies No results for input(s): TSH, T4TOTAL, T3FREE, THYROIDAB in the last 72 hours.  Invalid input(s): FREET3 Anemia work up No results for input(s): VITAMINB12, FOLATE, FERRITIN, TIBC, IRON, RETICCTPCT in the last 72 hours. Urinalysis    Component Value Date/Time   COLORURINE YELLOW 02/12/2018 0538   APPEARANCEUR HAZY (A) 02/12/2018 0538   LABSPEC 1.028 02/12/2018 0538   PHURINE 7.0 02/12/2018 0538   GLUCOSEU NEGATIVE 02/12/2018 0538   HGBUR MODERATE (A) 02/12/2018 0538   BILIRUBINUR NEGATIVE 02/12/2018 0538   KETONESUR 20 (A) 02/12/2018 0538   PROTEINUR NEGATIVE 02/12/2018 0538   NITRITE NEGATIVE 02/12/2018 0538   LEUKOCYTESUR TRACE (A) 02/12/2018 0538   Sepsis Labs Invalid input(s): PROCALCITONIN,  WBC,  LACTICIDVEN Microbiology No results found for this or any previous visit (from the past 240 hour(s)).   Time coordinating discharge: <30 minutes  SIGNED:   Eddie North, MD  [facility] 02/12/2018, 8:19 AM Pager   If 7PM-7AM, please contact night-coverage [email or web address] Password [email or web address]

## 2018-02-12 NOTE — ED Provider Notes (Addendum)
[facility] EMERGENCY DEPARTMENT Provider Note   CSN: [id number] Arrival date & time: 02/11/18  1923     History   Chief Complaint Chief Complaint  Patient presents with  . Abdominal Pain  . Chest Pain    HPI Kristen Ramos is a 57 y.o. female.  Burning in esophagus, epigastric pain, supraumbilical abdominal pain since yesterday.  No history of cardiac disease.  Review of systems positive for copious vomiting.  No crushing substernal chest pain, diaphoresis, fever, sweats, chills, diarrhea.  Patient was given Zofran 4 mg IV via EMS.     Past Medical History:  Diagnosis Date  . Arthritis   . Chronic back pain     There are no active problems to display for this patient.   Past Surgical History:  Procedure Laterality Date  . ABDOMINAL HYSTERECTOMY    . BACK SURGERY    . CESAREAN SECTION       OB History   None      Home Medications    Prior to Admission medications   Medication Sig Start Date End Date Taking? Authorizing Provider  esomeprazole (NEXIUM) 40 MG capsule Take 40 mg by mouth daily at 12 noon.    [provider]  ondansetron (ZOFRAN ODT) 8 MG disintegrating tablet 8mg  ODT q8 hours prn nausea 02/02/16   Palumbo, April, MD  ondansetron (ZOFRAN) 4 MG tablet Take 1 tablet (4 mg total) by mouth every 6 (six) hours. 02/11/18   14/3/19, MD  oxyCODONE-acetaminophen (PERCOCET) 7.5-325 MG tablet Take 1 tablet by mouth every 6 (six) hours as needed for severe pain.    [provider]  pantoprazole (PROTONIX) 40 MG tablet Take 1 tablet (40 mg total) by mouth daily. 02/11/18   14/3/19, MD    Family History No family history on file.  Social History Social History   Tobacco Use  . Smoking status: Current Every Day Smoker    Types: Cigarettes  . Smokeless tobacco: Never Used  Substance Use Topics  . Alcohol use: No  . Drug use: No     Allergies   Patient has no known allergies.   Review of Systems Review  of Systems  All other systems reviewed and are negative.    Physical Exam Updated Vital Signs BP 131/78   Pulse 75   Temp 98.5 F (36.9 C) (Oral)   Resp 16   SpO2 99%   Physical Exam  Constitutional: She is oriented to person, place, and time.  Vomiting bilious material, restless  HENT:  Head: Normocephalic and atraumatic.  Eyes: Conjunctivae are normal.  Neck: Neck supple.  Cardiovascular: Normal rate and regular rhythm.  Pulmonary/Chest: Effort normal and breath sounds normal.  Abdominal: Soft. Bowel sounds are normal.  Musculoskeletal: Normal range of motion.  Neurological: She is alert and oriented to person, place, and time.  Skin: Skin is warm and dry.  Psychiatric: She has a normal mood and affect. Her behavior is normal.  Nursing note and vitals reviewed.    ED Treatments / Results  Labs (all labs ordered are listed, but only abnormal results are displayed) Labs Reviewed  COMPREHENSIVE METABOLIC PANEL - Abnormal; Notable for the following components:      Result Value   Glucose, Bld 147 (*)    All other components within normal limits  LIPASE, BLOOD  CBC  URINALYSIS, ROUTINE W REFLEX MICROSCOPIC  I-STAT BETA HCG BLOOD, ED ([facility], [facility], [facility] ONLY)  I-STAT TROPONIN, ED  I-STAT BETA  HCG BLOOD, ED ([facility], [facility], [facility] ONLY)    EKG EKG Interpretation  Date/Time:  Tuesday February 11 2018 19:44:45 EST Ventricular Rate:  68 PR Interval:  136 QRS Duration: 84 QT Interval:  430 QTC Calculation: 457 R Axis:   47 Text Interpretation:  Normal sinus rhythm Normal ECG Confirmed by Donnetta Hutching ([id number]) on 02/11/2018 9:48:56 PM   Radiology Dg Chest Portable 1 View  Result Date: 02/11/2018 CLINICAL DATA:  57 year old female with chest pain. EXAM: PORTABLE CHEST 1 VIEW COMPARISON:  Chest radiograph dated 02/02/2016 FINDINGS: The lungs are clear. There is no pleural effusion or pneumothorax. Borderline cardiomegaly. No acute osseous pathology. Partially visualized cervical fixation  hardware. IMPRESSION: No active disease. Electronically Signed   By: Elgie Collard M.D.   On: 02/11/2018 22:38    Procedures Procedures (including critical care time)  Medications Ordered in ED Medications  fentaNYL (SUBLIMAZE) injection 50 mcg (has no administration in time range)  promethazine (PHENERGAN) injection 12.5 mg (has no administration in time range)  0.9 %  sodium chloride infusion (has no administration in time range)  ondansetron (ZOFRAN) injection 4 mg (4 mg Intravenous Given 02/11/18 2213)  sodium chloride 0.9 % bolus 1,000 mL (1,000 mLs Intravenous New Bag/Given 02/11/18 2218)  famotidine (PEPCID) IVPB 20 mg premix (0 mg Intravenous Stopped 02/12/18 0005)  fentaNYL (SUBLIMAZE) injection 50 mcg (50 mcg Intravenous Given 02/11/18 2214)  sodium chloride 0.9 % bolus 1,000 mL (1,000 mLs Intravenous New Bag/Given 02/11/18 2212)  iohexol (OMNIPAQUE) 300 MG/ML solution 100 mL (100 mLs Intravenous Contrast Given 02/12/18 0007)     Initial Impression / Assessment and Plan / ED Course  I have reviewed the triage vital signs and the nursing notes.  Pertinent labs & imaging results that were available during my care of the patient were reviewed by me and considered in my medical decision making (see chart for details).     Patient presents with vomiting and upper abdominal pain.  Screening labs including lipase, troponin, liver functions all negative.  EKG normal.  Patient given IV fluids, IV Zofran, IV Pepcid, IV fentanyl IV Phenergan.  Her symptoms initially improved.   0000: Patient rechecked at midnight.  Supraumbilical pain is not improved.  Will order CT of abdomen and pelvis.  Discussed with Dr. Wilkie Aye. Final Clinical Impressions(s) / ED Diagnoses   Final diagnoses:  Intractable vomiting with nausea, unspecified vomiting type  Acute gastritis without hemorrhage, unspecified gastritis type    ED Discharge Orders         Ordered    ondansetron (ZOFRAN) 4 MG tablet   Every 6 hours     02/11/18 2350    pantoprazole (PROTONIX) 40 MG tablet  Daily     02/11/18 2350           Donnetta Hutching, MD 02/12/18 Lorin Picket    Donnetta Hutching, MD 02/14/18 [id number]

## 2018-02-12 NOTE — ED Notes (Signed)
Patient in fetal position c/o 10/10 abdominal pain.

## 2018-02-12 NOTE — ED Notes (Signed)
Patient transported to US 

## 2018-02-12 NOTE — H&P (Signed)
History and Physical    Kristen Ramos MVH:[id number] DOB: 10-Aug-1960 DOA: 02/11/2018  Referring MD/NP/PA: Ross Marcus, MD PCP: Patient, No Pcp Per  Patient coming from: Home via EMS  Chief Complaint: Nausea, vomiting, and abdominal pain  I have personally briefly reviewed patient's old medical records in [facility]   HPI: Kristen Ramos is a 57 y.o. female with medical history significant of rheumatoid arthritis and chronic back pain; who presents with nausea, vomiting, and periumbilical abdominal pain which started yesterday. Denies eating anything out of the norm or any recent sick contacts.  She reports having multiple episodes of nonbloody emesis with throbbing pain.  Unable to keep any food or liquids down.  Associated symptoms include subjective fever and malaise.  Denies currently having any diaphoresis, chest pain, shortness of breath, diarrhea, or previous symptoms like this in the past.  Review of records note that en route with EMS patient was given Zofran, but refused ASA and nitro for reported chest pain complaints..   ED Course: Upon admission into the emergency department patient was noted to be afebrile with vital signs relatively within normal limits.  Lab work was unremarkable including CBC CMP, lipase, and troponin.  CT scan of the abdomen showed mild biliary duct dilatation without choledocholithiasis.  Ultrasound of the abdomen was noted to show dilatation of common bile duct without signs of a stone present.  Patient was given a total of 3 L of IV fluids, Zofran, Phenergan, Haldol, and fentanyl for symptoms without relief.  TRH called to admit for persistent nausea and vomiting.  Review of Systems  Constitutional: Positive for fever.  HENT: Negative for congestion and sinus pain.   Eyes: Negative for photophobia and pain.  Respiratory: Negative for sputum production and shortness of breath.   Cardiovascular: Negative for chest pain and leg swelling.    Gastrointestinal: Positive for abdominal pain, nausea and vomiting.  Genitourinary: Negative for dysuria and hematuria.  Musculoskeletal: Negative for back pain and joint pain.  Skin: Negative for itching and rash.  Neurological: Negative for speech change and loss of consciousness.  Psychiatric/Behavioral: Negative for memory loss and substance abuse.    Past Medical History:  Diagnosis Date  . Arthritis   . Chronic back pain     Past Surgical History:  Procedure Laterality Date  . ABDOMINAL HYSTERECTOMY    . BACK SURGERY    . CESAREAN SECTION       reports that she has been smoking cigarettes. She has never used smokeless tobacco. She reports that she does not drink alcohol or use drugs.  Allergies  Allergen Reactions  . Bee Venom Swelling    No family history on file.  Prior to Admission medications   Medication Sig Start Date End Date Taking? Authorizing Provider  oxyCODONE (ROXICODONE) 15 MG immediate release tablet Take 15 mg by mouth 4 (four) times daily as needed. 02/03/18  Yes [provider]  Tofacitinib Citrate 11 MG TB24 Take 11 mg by mouth daily.   Yes [provider]  ondansetron (ZOFRAN ODT) 8 MG disintegrating tablet 8mg  ODT q8 hours prn nausea Patient not taking: Reported on 02/12/2018 02/02/16   Palumbo, April, MD    Physical Exam:  Constitutional: Middle-aged female who appears to be in some discomfort curled up in the fetal position on the hospital gurney. Vitals:   02/11/18 2330 02/12/18 0130 02/12/18 0145 02/12/18 0357  BP:  134/61 (!) 151/86 129/61  Pulse: 74 74 76 94  Resp: 17 16 18  15  Temp:      TempSrc:      SpO2: 99% 99% 100% 100%   Eyes: PERRL, lids and conjunctivae normal ENMT: Mucous membranes are moist. Posterior pharynx clear of any exudate or lesions.  Neck: normal, supple, no masses, no thyromegaly Respiratory: clear to auscultation bilaterally, no wheezing, no crackles. Normal respiratory effort. No accessory  muscle use.  Cardiovascular: Regular rate and rhythm, no murmurs / rubs / gallops. No extremity edema. 2+ pedal pulses. No carotid bruits.  Abdomen: Mild epigastric tenderness, no masses palpated. No hepatosplenomegaly. Bowel sounds positive.  No peritoneal signs appreciated. Musculoskeletal: no clubbing / cyanosis. No joint deformity upper and lower extremities. Good ROM, no contractures. Normal muscle tone.  Skin: no rashes, lesions, ulcers. No induration Neurologic: CN 2-12 grossly intact. Sensation intact, DTR normal. Strength 5/5 in all 4.  Psychiatric: Normal judgment and insight. Alert and oriented x 3. Normal mood.     Labs on Admission: I have personally reviewed following labs and imaging studies  CBC: Recent Labs  Lab 02/11/18 1936  WBC 6.4  HGB 13.4  HCT 40.3  MCV 89.0  PLT 283   Basic Metabolic Panel: Recent Labs  Lab 02/11/18 1936  NA 138  K 3.7  CL 105  CO2 22  GLUCOSE 147*  BUN 9  CREATININE 0.82  CALCIUM 9.5   GFR: CrCl cannot be calculated (Unknown ideal weight.). Liver Function Tests: Recent Labs  Lab 02/11/18 1936  AST 25  ALT 22  ALKPHOS 78  BILITOT 0.8  PROT 7.8  ALBUMIN 4.1   Recent Labs  Lab 02/11/18 1936  LIPASE 26   No results for input(s): AMMONIA in the last 168 hours. Coagulation Profile: No results for input(s): INR, PROTIME in the last 168 hours. Cardiac Enzymes: No results for input(s): CKTOTAL, CKMB, CKMBINDEX, TROPONINI in the last 168 hours. BNP (last 3 results) No results for input(s): PROBNP in the last 8760 hours. HbA1C: No results for input(s): HGBA1C in the last 72 hours. CBG: No results for input(s): GLUCAP in the last 168 hours. Lipid Profile: No results for input(s): CHOL, HDL, LDLCALC, TRIG, CHOLHDL, LDLDIRECT in the last 72 hours. Thyroid Function Tests: No results for input(s): TSH, T4TOTAL, FREET4, T3FREE, THYROIDAB in the last 72 hours. Anemia Panel: No results for input(s): VITAMINB12, FOLATE,  FERRITIN, TIBC, IRON, RETICCTPCT in the last 72 hours. Urine analysis:    Component Value Date/Time   COLORURINE YELLOW 02/02/2016 0445   APPEARANCEUR CLEAR 02/02/2016 0445   LABSPEC 1.016 02/02/2016 0445   PHURINE 6.0 02/02/2016 0445   GLUCOSEU NEGATIVE 02/02/2016 0445   HGBUR NEGATIVE 02/02/2016 0445   BILIRUBINUR NEGATIVE 02/02/2016 0445   KETONESUR NEGATIVE 02/02/2016 0445   PROTEINUR NEGATIVE 02/02/2016 0445   NITRITE NEGATIVE 02/02/2016 0445   LEUKOCYTESUR NEGATIVE 02/02/2016 0445   Sepsis Labs: No results found for this or any previous visit (from the past 240 hour(s)).   Radiological Exams on Admission: Ct Abdomen Pelvis W Contrast  Addendum Date: 02/12/2018   ADDENDUM REPORT: 02/12/2018 01:24 ADDENDUM: RIGHT upper quadrant ultrasound may be of added value. Electronically Signed   By: Awilda Metro M.D.   On: 02/12/2018 01:24   Result Date: 02/12/2018 CLINICAL DATA:  Upper abdominal pain and vomiting. History of hysterectomy and spine surgery. EXAM: CT ABDOMEN AND PELVIS WITH CONTRAST TECHNIQUE: Multidetector CT imaging of the abdomen and pelvis was performed using the standard protocol following bolus administration of intravenous contrast. CONTRAST:  OMNIPAQUE IOHEXOL 300 MG/ML  SOLN COMPARISON:  Abdominal series February 02, 2016 FINDINGS: LOWER CHEST: Lung bases are clear. Included heart size is normal. No pericardial effusion. HEPATOBILIARY: Mild intrahepatic biliary dilatation noted within the LEFT lobe of the liver. Liver is otherwise unremarkable. Normal gallbladder. Dilated 14 mm common bile duct without choledocholithiasis, taper distally. PANCREAS: Normal. SPLEEN: Normal. ADRENALS/URINARY TRACT: Kidneys are orthotopic, demonstrating symmetric enhancement. No nephrolithiasis, hydronephrosis or solid renal masses. The unopacified ureters are normal in course and caliber. Delayed imaging through the kidneys demonstrates symmetric prompt contrast excretion within the  proximal urinary collecting system. Distended urinary bladder to the level of the umbilicus, otherwise unremarkable. Thickened LEFT adrenal gland seen with hyperplasia. STOMACH/BOWEL: Small hiatal hernia. The small and large bowel are normal in course and caliber without inflammatory changes. Mild colonic diverticulosis. Normal appendix. VASCULAR/LYMPHATIC: Aortoiliac vessels are normal in course and caliber. Mild aortoiliac calcific atherosclerosis. No lymphadenopathy by CT size criteria. REPRODUCTIVE: Status post hysterectomy. OTHER: No intraperitoneal free fluid or free air. Small fat containing umbilical hernia. MUSCULOSKELETAL: Nonacute. L3-4 PLIF, L4-5 and L5-S1 disc prosthesis and posterior decompression. Mild L2-3 adjacent segment disease. IMPRESSION: 1. Mild biliary dilatation without choledocholithiasis. Obstructing ampullary mass less likely though, recommend non emergent MRCP and correlation with liver function tests. 2. Colonic diverticulosis without acute diverticulitis. 3. Distended urinary bladder, recommend correlation with voiding. Aortic Atherosclerosis (ICD10-I70.0). Electronically Signed: By: Awilda Metro M.D. On: 02/12/2018 01:18   Dg Chest Portable 1 View  Result Date: 02/11/2018 CLINICAL DATA:  57 year old female with chest pain. EXAM: PORTABLE CHEST 1 VIEW COMPARISON:  Chest radiograph dated 02/02/2016 FINDINGS: The lungs are clear. There is no pleural effusion or pneumothorax. Borderline cardiomegaly. No acute osseous pathology. Partially visualized cervical fixation hardware. IMPRESSION: No active disease. Electronically Signed   By: Elgie Collard M.D.   On: 02/11/2018 22:38   US Abdomen Limited Ruq  Result Date: 02/12/2018 CLINICAL DATA:  57 year old female with abdominal pain. EXAM: ULTRASOUND ABDOMEN LIMITED RIGHT UPPER QUADRANT COMPARISON:  CT dated 02/12/2018 FINDINGS: Gallbladder: There is no gallstone, gallbladder wall thickening, or pericholecystic fluid. Negative  sonographic Murphy's sign. There are areas of adenomyomatosis of the anterior gallbladder wall. Common bile duct: Diameter: 9 mm Liver: The liver is unremarkable as visualized. Portal vein is patent on color Doppler imaging with normal direction of blood flow towards the liver. IMPRESSION: 1. No gallstones or sonographic findings of acute cholecystitis. 2. Dilatation of the common bile duct as seen on the earlier CT. Electronically Signed   By: Elgie Collard M.D.   On: 02/12/2018 02:37    EKG: Independently reviewed.  Normal sinus rhythm at 68 bpm  Assessment/Plan Intractable nausea and vomiting, abdominal pain: Acute.  Patient presents with complaints of nausea, vomiting, and periumbilical abdominal pain.  CT scan showing some mild dilatation of common bile duct, but no other clear cause of symptoms.  Patient reports recently running out of her oxycodone medication.  Symptoms could be secondary to gastroenteritis versus opioid withdrawals versus other. - Admit to a MedSurg bed - Check urine drug screen. 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  Review of records shows that patient had Extrahepatic biliary tract dilatation with a common bile duct measuring 12 mm seen previously in 11/2016.  CT scan of the abdomen now showing 14 mm dilatation of the common bile duct without signs of stone. - May consider consulting GI for further  recommendations and/or obtaining MRCP  Rheumatoid arthritis: Patient diagnosed at the age of 35 currently on Tofacitnib and oxycodone.  Patient reports recently running out of her oxycodone pain medication. - Continue current home medications Tofacitnib and oxycodone.  DVT prophylaxis: Lovenox Code Status: Full Family Communication: Discussed plan of care with the patient family present at bedside Disposition Plan: Likely discharge home in 1 to 2 days. Consults  called: none  Admission status: Observation Clydie Braun MD [facility] Pager [phone]   If 7PM-7AM, please contact night-coverage [email or web address] Password [email or web address]  02/12/2018, 5:06 AM

## 2018-02-12 NOTE — ED Notes (Signed)
Pt tolerated PO intake without difficulty

## 2018-02-12 NOTE — ED Provider Notes (Signed)
Patient signed out pending CT scan.  T scan with biliary ductal dilation but no obvious stone.  Recommend right upper quadrant ultrasound.  Right upper quadrant ultrasound shows no evidence of cholecystitis.  There is dilation of the common biliary ducts.  Ultimate recommendation for nonemergent MRCP.  Liver function testing is negative.  2:52 AM Patient with persistent nausea and emesis following Zofran and Phenergan.  Discussed with her the results.  She was dosed Haldol.  Will reassess.  If she is unable to tolerate fluids, she may need admission.  4:47 AM Patient with reported continued nausea.  She has had an episode of emesis and on repeat evaluation is in fetal position on the stretcher.  Given that she is unable to tolerate fluids, will admit for hydration.  She may need GI evaluation for possible MRCP given dilation of the bile ducts.   Shon Baton, MD 02/12/18 [phone]

## 2018-04-15 ENCOUNTER — Emergency Department (HOSPITAL_COMMUNITY): Payer: Medicare Other

## 2018-04-15 ENCOUNTER — Encounter (HOSPITAL_COMMUNITY): Payer: Self-pay | Admitting: Emergency Medicine

## 2018-04-15 ENCOUNTER — Other Ambulatory Visit: Payer: Self-pay

## 2018-04-15 ENCOUNTER — Inpatient Hospital Stay (HOSPITAL_COMMUNITY)
Admission: EM | Admit: 2018-04-15 | Discharge: 2018-04-21 | DRG: 446 | Disposition: A | Discharge status: Home / Self Care | Payer: Medicare Other | Attending: Internal Medicine | Admitting: Internal Medicine

## 2018-04-15 DIAGNOSIS — M069 Rheumatoid arthritis, unspecified: Secondary | ICD-10-CM | POA: Diagnosis present

## 2018-04-15 DIAGNOSIS — R1084 Generalized abdominal pain: Secondary | ICD-10-CM

## 2018-04-15 DIAGNOSIS — K838 Other specified diseases of biliary tract: Secondary | ICD-10-CM | POA: Diagnosis not present

## 2018-04-15 DIAGNOSIS — Z79891 Long term (current) use of opiate analgesic: Secondary | ICD-10-CM

## 2018-04-15 DIAGNOSIS — M549 Dorsalgia, unspecified: Secondary | ICD-10-CM | POA: Diagnosis present

## 2018-04-15 DIAGNOSIS — M1990 Unspecified osteoarthritis, unspecified site: Secondary | ICD-10-CM | POA: Diagnosis present

## 2018-04-15 DIAGNOSIS — M0520 Rheumatoid vasculitis with rheumatoid arthritis of unspecified site: Secondary | ICD-10-CM | POA: Diagnosis not present

## 2018-04-15 DIAGNOSIS — R112 Nausea with vomiting, unspecified: Secondary | ICD-10-CM | POA: Diagnosis present

## 2018-04-15 DIAGNOSIS — F17210 Nicotine dependence, cigarettes, uncomplicated: Secondary | ICD-10-CM | POA: Diagnosis present

## 2018-04-15 DIAGNOSIS — G8929 Other chronic pain: Secondary | ICD-10-CM | POA: Diagnosis present

## 2018-04-15 DIAGNOSIS — Z91030 Bee allergy status: Secondary | ICD-10-CM

## 2018-04-15 DIAGNOSIS — Z79899 Other long term (current) drug therapy: Secondary | ICD-10-CM

## 2018-04-15 DIAGNOSIS — R109 Unspecified abdominal pain: Secondary | ICD-10-CM

## 2018-04-15 LAB — COMPREHENSIVE METABOLIC PANEL
ALT: 21 U/L (ref 0–44)
AST: 21 U/L (ref 15–41)
Albumin: 4.4 g/dL (ref 3.5–5.0)
Alkaline Phosphatase: 85 U/L (ref 38–126)
Anion gap: 10 (ref 5–15)
BILIRUBIN TOTAL: 0.5 mg/dL (ref 0.3–1.2)
BUN: 9 mg/dL (ref 6–20)
CO2: 21 mmol/L — ABNORMAL LOW (ref 22–32)
Calcium: 9.6 mg/dL (ref 8.9–10.3)
Chloride: 109 mmol/L (ref 98–111)
Creatinine, Ser: 0.7 mg/dL (ref 0.44–1.00)
GFR calc Af Amer: >60 mL/min (ref >60)
GFR calc non Af Amer: >60 mL/min (ref >60)
Glucose, Bld: 158 mg/dL — ABNORMAL HIGH (ref 70–99)
POTASSIUM: 3.6 mmol/L (ref 3.5–5.1)
Sodium: 140 mmol/L (ref 135–145)
Total Protein: 8.2 g/dL — ABNORMAL HIGH (ref 6.5–8.1)

## 2018-04-15 LAB — LIPASE, BLOOD: Lipase: 36 U/L (ref 11–51)

## 2018-04-15 LAB — CBC WITH DIFFERENTIAL/PLATELET
Abs Immature Granulocytes: 0.02 10*3/uL (ref 0.00–0.07)
Basophils Absolute: 0 10*3/uL (ref 0.0–0.1)
Basophils Relative: 1 %
EOS ABS: 0 10*3/uL (ref 0.0–0.5)
EOS PCT: 0 %
HCT: 42.6 % (ref 36.0–46.0)
Hemoglobin: 14.2 g/dL (ref 12.0–15.0)
Immature Granulocytes: 0 %
Lymphocytes Relative: 17 %
Lymphs Abs: 1 10*3/uL (ref 0.7–4.0)
MCH: 30 pg (ref 26.0–34.0)
MCHC: 33.3 g/dL (ref 30.0–36.0)
MCV: 89.9 fL (ref 80.0–100.0)
Monocytes Absolute: 0.1 10*3/uL (ref 0.1–1.0)
Monocytes Relative: 2 %
Neutro Abs: 4.9 10*3/uL (ref 1.7–7.7)
Neutrophils Relative %: 80 %
Platelets: 239 10*3/uL (ref 150–400)
RBC: 4.74 MIL/uL (ref 3.87–5.11)
RDW: 12.7 % (ref 11.5–15.5)
WBC: 6.1 10*3/uL (ref 4.0–10.5)
nRBC: 0 % (ref 0.0–0.2)

## 2018-04-15 MED ORDER — HALOPERIDOL LACTATE 5 MG/ML IJ SOLN
2.0000 mg | Freq: Once | INTRAMUSCULAR | Status: AC
  Administered 2018-04-15: 2 mg via INTRAVENOUS
  Filled 2018-04-15: qty 1

## 2018-04-15 MED ORDER — PROMETHAZINE HCL 25 MG/ML IJ SOLN
25.0000 mg | Freq: Once | INTRAMUSCULAR | Status: AC
  Administered 2018-04-15: 25 mg via INTRAVENOUS
  Filled 2018-04-15: qty 1

## 2018-04-15 MED ORDER — ONDANSETRON HCL 4 MG/2ML IJ SOLN
4.0000 mg | Freq: Once | INTRAMUSCULAR | Status: AC
  Administered 2018-04-15: 4 mg via INTRAVENOUS
  Filled 2018-04-15: qty 2

## 2018-04-15 MED ORDER — METOCLOPRAMIDE HCL 5 MG/ML IJ SOLN
10.0000 mg | Freq: Once | INTRAMUSCULAR | Status: AC
  Administered 2018-04-15: 10 mg via INTRAVENOUS
  Filled 2018-04-15: qty 2

## 2018-04-15 MED ORDER — SODIUM CHLORIDE 0.9 % IV BOLUS
1000.0000 mL | Freq: Once | INTRAVENOUS | Status: AC
  Administered 2018-04-15: 1000 mL via INTRAVENOUS

## 2018-04-15 MED ORDER — FENTANYL CITRATE (PF) 100 MCG/2ML IJ SOLN
50.0000 ug | Freq: Once | INTRAMUSCULAR | Status: AC
  Administered 2018-04-15: 50 ug via INTRAVENOUS
  Filled 2018-04-15: qty 2

## 2018-04-15 MED ORDER — IOPAMIDOL (ISOVUE-300) INJECTION 61%
100.0000 mL | Freq: Once | INTRAVENOUS | Status: AC | PRN
  Administered 2018-04-15: 100 mL via INTRAVENOUS

## 2018-04-15 NOTE — ED Provider Notes (Signed)
Emergency Department Provider Note   I have reviewed the triage vital signs and the nursing notes.   HISTORY  Chief Complaint Abdominal Pain   HPI Kristen Ramos is a 58 y.o. female with a history of hard to treat vomiting and gastroenteritis presents the emergency department today with bilateral lower abdomen abdominal pain is progressed to vomiting throughout the day today.  No fevers.  Patient is not very cooperative with history taking but sounds like no sick contacts.  Patient was resting quietly but when I initiated interview she had very exaggerated about her pain and writhing around and up on her hands and knees and it made her feel better.  On review of records she had a similar presentation last time and it of having a benign course. No other associated or modifying symptoms.    Past Medical History:  Diagnosis Date  . Arthritis   . Chronic back pain     Patient Active Problem List   Diagnosis Date Noted  . Abdominal pain 04/15/2018  . Nausea & vomiting 04/15/2018  . 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 Intractable nausea and vomiting 02/12/2018  . Epigastric abdominal pain 02/12/2018  . Acute gastroenteritis 02/12/2018    Past Surgical History:  Procedure Laterality Date  . ABDOMINAL HYSTERECTOMY    . BACK SURGERY    . CESAREAN SECTION      Current Outpatient Rx  . Order #: [id number] Class: Historical Med  . Order #: [id number] Class: Historical Med  . Order #: [id number] Class: Print    Allergies Bee venom  History reviewed. No pertinent family history.  Social History Social History   Tobacco Use  . Smoking status: Current Every Day Smoker    Types: Cigarettes  . Smokeless tobacco: Never Used  Substance Use Topics  . Alcohol use: No  . Drug use: No    Review of Systems  All other systems negative except as documented in the HPI. All pertinent positives and negatives as reviewed in the  HPI. ____________________________________________   PHYSICAL EXAM:  VITAL SIGNS: Vitals:   04/15/18 1715 04/15/18 2035 04/15/18 2334  BP: (!) 141/102 (!) 150/84 (!) 152/94  Pulse: 74 74 73  Resp: (!) 25 20 16   Temp: 97.9 F (36.6 C)    TempSrc: Oral    SpO2: 100% 100% 99%     Constitutional: Alert and oriented. Well appearing and in no acute distress. Eyes: Conjunctivae are normal. PERRL. EOMI. Head: Atraumatic. Nose: No congestion/rhinnorhea. Mouth/Throat: Mucous membranes are moist.  Oropharynx non-erythematous. Neck: No stridor.  No meningeal signs.   Cardiovascular: Normal rate, regular rhythm. Good peripheral circulation. Grossly normal heart sounds.   Respiratory: Normal respiratory effort.  No retractions. Lungs CTAB. Gastrointestinal: Soft and nontender. No distention.  Musculoskeletal: No lower extremity tenderness nor edema. No gross deformities of extremities. Neurologic:  Normal speech and language. No gross focal neurologic deficits are appreciated.  Skin:  Skin is warm, dry and intact. No rash noted.   ____________________________________________   LABS (all labs ordered are listed, but only abnormal results are displayed)  Labs Reviewed  COMPREHENSIVE METABOLIC PANEL - Abnormal; Notable for the following components:      Result Value   CO2 21 (*)    Glucose, Bld 158 (*)    Total Protein 8.2 (*)    All other components within normal limits  CBC WITH DIFFERENTIAL/PLATELET  LIPASE, BLOOD   ____________________________________________  EKG   EKG Interpretation  Date/Time:    Ventricular Rate:  PR Interval:    QRS Duration:   QT Interval:    QTC Calculation:   R Axis:     Text Interpretation:         ____________________________________________  RADIOLOGY  Ct Abdomen Pelvis W Contrast  Result Date: 04/15/2018 CLINICAL DATA:  58 year old female with bilious vomiting. Complaint of vertigo. EXAM: CT ABDOMEN AND PELVIS WITH CONTRAST  TECHNIQUE: Multidetector CT imaging of the abdomen and pelvis was performed using the standard protocol following bolus administration of intravenous contrast. CONTRAST:  ISOVUE-300 IOPAMIDOL (ISOVUE-300) INJECTION 61% COMPARISON:  CT Abdomen and Pelvis 02/12/2018. FINDINGS: Lower chest: Cardiac size at the upper limits of normal. Negative lung bases. No pericardial or pleural effusion. Hepatobiliary: Negative gallbladder, Phrygian cap (normal variant). Liver enhancement is within normal limits. There is prominence of the CBD measuring about 12 millimeters diameter (coronal image 35) which is unchanged from December. There is associated mild left intrahepatic biliary ductal dilatation (series 2, image 16). No obstructing etiology is identified. Pancreas: The pancreas appears normal, with no main pancreatic ductal dilatation. Spleen: Negative. Adrenals/Urinary Tract: Stable thickened left adrenal gland with relatively preserved adreniform shape. The right adrenal is normal. Bilateral renal enhancement and contrast excretion is symmetric and normal. Normal proximal ureters. Distended (685 milliliters) but otherwise unremarkable urinary bladder. Similar bladder distension in December. Stomach/Bowel: Negative rectum with mild retained stool. Negative sigmoid colon aside from redundancy. Decompressed descending and transverse colon. Largely decompressed right colon with moderate diverticulosis, but no active inflammation. Normal appendix (coronal image 33). Negative terminal ileum. No dilated or abnormal small bowel. There might be a small gastric hiatal hernia, but the stomach is fairly decompressed and otherwise normal. Negative duodenum. No mesenteric inflammation. No free air, free fluid. Vascular/Lymphatic: Aortoiliac calcified atherosclerosis. Major arterial structures in the abdomen and pelvis are patent. Portal venous system is patent. No lymphadenopathy. Reproductive: Negative. Other: No pelvic free fluid.  Musculoskeletal: Chronic lower lumbar decompression and fusion. Hardware appears stable. No acute osseous abnormality identified. IMPRESSION: 1. Persistent CBD enlargement, and left intrahepatic biliary ductal enlargement to a lesser extent. But this is unchanged since December and no obstructing etiology is identified. Query hyperbilirubinemia. ERCP or MRCP may be valuable. 2. No acute or inflammatory process identified in the abdomen or pelvis. 3. Distended (685 mL) but otherwise normal urinary bladder. 4. Aortic Atherosclerosis (ICD10-I70.0). Electronically Signed   By: Odessa Fleming M.D.   On: 04/15/2018 22:14    ____________________________________________   PROCEDURES  Procedure(s) performed:   Procedures   ____________________________________________   INITIAL IMPRESSION / ASSESSMENT AND PLAN / ED COURSE  .  Etiology for symptoms.  Will CT scan as she is either being very exaggerating with her symptoms or has an acute emergency in her abdomen.  We will treat her symptoms in the meantime.  Suspect patient can likely be discharged there is no evidence of severe dehydration and she is comfortable in between interviews.   Clinical Course as of Apr 16 120  Tue Apr 15, 2018  1906 No significant dehydration   Creatinine: 0.70 [JM]  1906 normal  WBC: 6.1 [JM]  1906 Unlikely pancreatitis  Lipase: 36 [JM]    Clinical Course User Index [JM] Evalyse Stroope, Barbara Cower, MD   CT ok. 4 doses of medications without improvement, will admit.   Pertinent labs & imaging results that were available during my care of the patient were reviewed by me and considered in my medical decision making (see chart for details).  ____________________________________________  FINAL CLINICAL IMPRESSION(S) /  ED DIAGNOSES  Final diagnoses:  Nausea and vomiting, intractability of vomiting not specified, unspecified vomiting type     MEDICATIONS GIVEN DURING THIS VISIT:  Medications  haloperidol lactate (HALDOL)  injection 2 mg (2 mg Intravenous Given 04/15/18 1737)  ondansetron (ZOFRAN) injection 4 mg (4 mg Intravenous Given 04/15/18 1737)  sodium chloride 0.9 % bolus 1,000 mL (0 mLs Intravenous Stopped 04/15/18 1853)  fentaNYL (SUBLIMAZE) injection 50 mcg (50 mcg Intravenous Given 04/15/18 1856)  promethazine (PHENERGAN) injection 25 mg (25 mg Intravenous Given 04/15/18 1855)  metoCLOPramide (REGLAN) injection 10 mg (10 mg Intravenous Given 04/15/18 2225)  iopamidol (ISOVUE-300) 61 % injection 100 mL (100 mLs Intravenous Contrast Given 04/15/18 2143)     NEW OUTPATIENT MEDICATIONS STARTED DURING THIS VISIT:  New Prescriptions   No medications on file    Note:  This note was prepared with assistance of Dragon voice recognition software. Occasional wrong-word or sound-a-like substitutions may have occurred due to the inherent limitations of voice recognition software.   Blayne Garlick, Barbara CowerJason, MD 04/16/18 [id number]

## 2018-04-15 NOTE — ED Triage Notes (Signed)
Per EMS-states history of vertigo-same symptoms happened about 5 months ago-spitting up bile-not responding to questions and simple tasks-BP 140/90, HR 90-patient exaggerating symptoms

## 2018-04-15 NOTE — ED Notes (Signed)
Pt moving around in the bed and will not sit still. Pt actively vomiting. Pt has emesis bag and vomited half in the bag and half on the floor. Pt unable to get a CT of her abdomen because she refused to lay on her back to obtain the image.

## 2018-04-15 NOTE — ED Notes (Signed)
Pt unwilling to allow staff in triage to get vital signs

## 2018-04-15 NOTE — ED Notes (Signed)
Pt is thrashing around in the bed and dry heaving. Bed in locked and lowest position. Pt in view of staff.

## 2018-04-15 NOTE — ED Notes (Signed)
Son, [place] [phone]

## 2018-04-15 NOTE — H&P (Signed)
H&P        History and Physical    Kristen Ramos WUX:[id number] DOB: 1960-07-07 DOA: 04/15/2018  PCP: Patient, No Pcp Per   Patient coming from: home  I have personally briefly reviewed patient's old medical records in [facility]  Chief Complaint: nausea, vomiting  HPI: Kristen Ramos is a 58 y.o. female with medical history significant of rheumatoid arthritis, chronic pain presents with nausea and vomiting.  Per documentation per EMS patient complained of nausea vomiting and some dizziness.  She had a similar episode about 5 months ago.  She did not respond to questions and was exaggerating her symptoms per EMS documentation.  Also concurred per ED physician.  Patient vitals were stable.  Patient received numerous doses of antiemetics in the ED before her symptoms were controlled.  Her blood work is unremarkable.  Due to sedating meds she received recently unable to get any further specifics from patient..  ED Course: Patient was unable to get her CT scan due to discomfort initially.  It has been reordered.  Patient has received Zofran, Reglan, ferritin, 1 L of IV fluids, 1 dose of fentanyl and 1 dose of IV Haldol and she is finally resting comfortably.  Patient's lab work unremarkable  Review of Systems: Positive for nausea vomiting abdominal pain otherwise unobtainable due to patient's current sedation   Past Medical History:  Diagnosis Date  . Arthritis, RA   . Chronic back pain     Past Surgical History:  Procedure Laterality Date  . ABDOMINAL HYSTERECTOMY    . BACK SURGERY    . CESAREAN SECTION       reports that she has been smoking cigarettes. She has never used smokeless tobacco. She reports that she does not drink alcohol or use drugs.  Allergies  Allergen Reactions  . Bee Venom Swelling    History reviewed. No pertinent family history.   Prior to Admission medications   Medication Sig Start Date End Date Taking? Authorizing Provider  oxyCODONE (ROXICODONE)  15 MG immediate release tablet Take 15 mg by mouth 4 (four) times daily as needed for pain.  02/03/18  Yes [provider]  tiZANidine (ZANAFLEX) 4 MG capsule Take 4 mg by mouth 2 (two) times daily as needed for muscle spasms. 03/25/18  Yes [provider]  ondansetron (ZOFRAN-ODT) 4 MG disintegrating tablet 8mg  ODT q8 hours prn nausea Patient not taking: Reported on 04/15/2018 02/12/18   Eddie North, MD    Physical Exam: Vitals:   04/15/18 1715 04/15/18 2035  BP: (!) 141/102 (!) 150/84  Pulse: 74 74  Resp: (!) 25 20  Temp: 97.9 F (36.6 C)   TempSrc: Oral   SpO2: 100% 100%    Constitutional: NAD, calm, comfortable, sleeping Vitals:   04/15/18 1715 04/15/18 2035  BP: (!) 141/102 (!) 150/84  Pulse: 74 74  Resp: (!) 25 20  Temp: 97.9 F (36.6 C)   TempSrc: Oral   SpO2: 100% 100%   Head : /AT  Respiratory: clear to auscultation bilaterally, no wheezing, no crackles. Normal respiratory effort. No accessory muscle use.  Cardiovascular: Regular rate and rhythm, no murmurs / rubs / gallops. No extremity edema. 2+ pedal pulses.  Abdomen: no tenderness, no masses palpated. Bowel sounds positive.  Skin: no rashes, lesions, ulcers. No induration Psychiatric: resting    Labs on Admission: I have personally reviewed following labs and imaging studies  CBC: Recent Labs  Lab 04/15/18 1719  WBC 6.1  NEUTROABS 4.9  HGB  14.2  HCT 42.6  MCV 89.9  PLT 239   Basic Metabolic Panel: Recent Labs  Lab 04/15/18 1719  NA 140  K 3.6  CL 109  CO2 21*  GLUCOSE 158*  BUN 9  CREATININE 0.70  CALCIUM 9.6   GFR: CrCl cannot be calculated (Unknown ideal weight.). Liver Function Tests: Recent Labs  Lab 04/15/18 1719  AST 21  ALT 21  ALKPHOS 85  BILITOT 0.5  PROT 8.2*  ALBUMIN 4.4   Recent Labs  Lab 04/15/18 1719  LIPASE 36   No results for input(s): AMMONIA in the last 168 hours. Coagulation Profile: No results for input(s): INR, PROTIME in the  last 168 hours. Cardiac Enzymes: No results for input(s): CKTOTAL, CKMB, CKMBINDEX, TROPONINI in the last 168 hours. BNP (last 3 results) No results for input(s): PROBNP in the last 8760 hours. HbA1C: No results for input(s): HGBA1C in the last 72 hours. CBG: No results for input(s): GLUCAP in the last 168 hours. Lipid Profile: No results for input(s): CHOL, HDL, LDLCALC, TRIG, CHOLHDL, LDLDIRECT in the last 72 hours. Thyroid Function Tests: No results for input(s): TSH, T4TOTAL, FREET4, T3FREE, THYROIDAB in the last 72 hours. Anemia Panel: No results for input(s): VITAMINB12, FOLATE, FERRITIN, TIBC, IRON, RETICCTPCT in the last 72 hours. Urine analysis:    Component Value Date/Time   COLORURINE YELLOW 02/12/2018 0538   APPEARANCEUR HAZY (A) 02/12/2018 0538   LABSPEC 1.028 02/12/2018 0538   PHURINE 7.0 02/12/2018 0538   GLUCOSEU NEGATIVE 02/12/2018 0538   HGBUR MODERATE (A) 02/12/2018 0538   BILIRUBINUR NEGATIVE 02/12/2018 0538   KETONESUR 20 (A) 02/12/2018 0538   PROTEINUR NEGATIVE 02/12/2018 0538   NITRITE NEGATIVE 02/12/2018 0538   LEUKOCYTESUR TRACE (A) 02/12/2018 0538    Radiological Exams on Admission: Ct Abdomen Pelvis W Contrast  Result Date: 04/15/2018 CLINICAL DATA:  58 year old female with bilious vomiting. Complaint of vertigo. EXAM: CT ABDOMEN AND PELVIS WITH CONTRAST TECHNIQUE: Multidetector CT imaging of the abdomen and pelvis was performed using the standard protocol following bolus administration of intravenous contrast. CONTRAST:  ISOVUE-300 IOPAMIDOL (ISOVUE-300) INJECTION 61% COMPARISON:  CT Abdomen and Pelvis 02/12/2018. FINDINGS: Lower chest: Cardiac size at the upper limits of normal. Negative lung bases. No pericardial or pleural effusion. Hepatobiliary: Negative gallbladder, Phrygian cap (normal variant). Liver enhancement is within normal limits. There is prominence of the CBD measuring about 12 millimeters diameter (coronal image 35) which is  unchanged from December. There is associated mild left intrahepatic biliary ductal dilatation (series 2, image 16). No obstructing etiology is identified. Pancreas: The pancreas appears normal, with no main pancreatic ductal dilatation. Spleen: Negative. Adrenals/Urinary Tract: Stable thickened left adrenal gland with relatively preserved adreniform shape. The right adrenal is normal. Bilateral renal enhancement and contrast excretion is symmetric and normal. Normal proximal ureters. Distended (685 milliliters) but otherwise unremarkable urinary bladder. Similar bladder distension in December. Stomach/Bowel: Negative rectum with mild retained stool. Negative sigmoid colon aside from redundancy. Decompressed descending and transverse colon. Largely decompressed right colon with moderate diverticulosis, but no active inflammation. Normal appendix (coronal image 33). Negative terminal ileum. No dilated or abnormal small bowel. There might be a small gastric hiatal hernia, but the stomach is fairly decompressed and otherwise normal. Negative duodenum. No mesenteric inflammation. No free air, free fluid. Vascular/Lymphatic: Aortoiliac calcified atherosclerosis. Major arterial structures in the abdomen and pelvis are patent. Portal venous system is patent. No lymphadenopathy. Reproductive: Negative. Other: No pelvic free fluid. Musculoskeletal: Chronic lower lumbar decompression and fusion.  Hardware appears stable. No acute osseous abnormality identified. IMPRESSION: 1. Persistent CBD enlargement, and left intrahepatic biliary ductal enlargement to a lesser extent. But this is unchanged since December and no obstructing etiology is identified. Query hyperbilirubinemia. ERCP or MRCP may be valuable. 2. No acute or inflammatory process identified in the abdomen or pelvis. 3. Distended (685 mL) but otherwise normal urinary bladder. 4. Aortic Atherosclerosis (ICD10-I70.0). Electronically Signed   By: Odessa FlemingH  Hall M.D.   On:  04/15/2018 22:14    EKG: Independently reviewed. pending  Assessment/Plan Principal Problem:   Intractable nausea and vomiting Active Problems:   Rheumatoid arteritis (HCC)   Abdominal pain    -Supportive care. Ct abd/pelvis pending.  Blood work unremarkable . Lipase neg. Afebrile, Not tachycardic -Continue home meds for chronic medical issues   DVT prophylaxis: SCDs Code Status: Full Disposition Plan: Home tomorrow  Admission status: Observation medical floor    Jyron Turman Johnson-Pitts MD [facility] Pager [phone]  If 7PM-7AM, please contact night-coverage [email or web address] Password [email or web address]  04/15/2018, 10:28 PM

## 2018-04-15 NOTE — ED Notes (Signed)
Pt not cooperating for CT scan. Would not follow commands and stayed balled up in fetal position; actively vomiting.

## 2018-04-16 ENCOUNTER — Observation Stay (HOSPITAL_COMMUNITY): Payer: Medicare Other

## 2018-04-16 ENCOUNTER — Observation Stay: Payer: Self-pay

## 2018-04-16 DIAGNOSIS — R112 Nausea with vomiting, unspecified: Secondary | ICD-10-CM | POA: Diagnosis not present

## 2018-04-16 LAB — HIV ANTIBODY (ROUTINE TESTING W REFLEX): HIV SCREEN 4TH GENERATION: NONREACTIVE

## 2018-04-16 MED ORDER — SODIUM CHLORIDE 0.9% FLUSH: 10.0000 mL | INTRAVENOUS | Status: DC | PRN

## 2018-04-16 MED ORDER — ONDANSETRON HCL 4 MG PO TABS
4.0000 mg | ORAL_TABLET | Freq: Four times a day (QID) | ORAL | Status: DC | PRN
  Administered 2018-04-18 – 2018-04-20 (×4): 4 mg via ORAL
  Filled 2018-04-16 (×5): qty 1

## 2018-04-16 MED ORDER — PANTOPRAZOLE SODIUM 40 MG IV SOLR
40.0000 mg | Freq: Two times a day (BID) | INTRAVENOUS | Status: AC
  Administered 2018-04-16 – 2018-04-17 (×3): 40 mg via INTRAVENOUS
  Filled 2018-04-16 (×3): qty 40

## 2018-04-16 MED ORDER — PANTOPRAZOLE SODIUM 40 MG IV SOLR
40.0000 mg | INTRAVENOUS | Status: DC
  Administered 2018-04-18 – 2018-04-21 (×4): 40 mg via INTRAVENOUS
  Filled 2018-04-16 (×4): qty 40

## 2018-04-16 MED ORDER — POLYETHYLENE GLYCOL 3350 17 G PO PACK: 17.0000 g | PACK | Freq: Every day | ORAL | Status: DC | PRN

## 2018-04-16 MED ORDER — POTASSIUM CHLORIDE IN NACL 20-0.9 MEQ/L-% IV SOLN
INTRAVENOUS | Status: AC
  Administered 2018-04-16 (×2): via INTRAVENOUS
  Filled 2018-04-16 (×2): qty 1000

## 2018-04-16 MED ORDER — OXYCODONE HCL 5 MG PO TABS
15.0000 mg | ORAL_TABLET | Freq: Four times a day (QID) | ORAL | Status: DC | PRN
  Administered 2018-04-16 – 2018-04-20 (×10): 15 mg via ORAL
  Filled 2018-04-16 (×10): qty 3

## 2018-04-16 MED ORDER — MORPHINE SULFATE (PF) 2 MG/ML IV SOLN
2.0000 mg | INTRAVENOUS | Status: DC | PRN
  Administered 2018-04-16 – 2018-04-19 (×7): 2 mg via INTRAVENOUS
  Filled 2018-04-16 (×7): qty 1

## 2018-04-16 MED ORDER — ACETAMINOPHEN 650 MG RE SUPP: 650.0000 mg | Freq: Four times a day (QID) | RECTAL | Status: DC | PRN

## 2018-04-16 MED ORDER — LORAZEPAM 2 MG/ML IJ SOLN
1.0000 mg | Freq: Once | INTRAMUSCULAR | Status: AC | PRN
  Administered 2018-04-16: 1 mg via INTRAVENOUS
  Filled 2018-04-16: qty 1

## 2018-04-16 MED ORDER — ONDANSETRON HCL 4 MG/2ML IJ SOLN
4.0000 mg | Freq: Four times a day (QID) | INTRAMUSCULAR | Status: DC | PRN
  Administered 2018-04-16 – 2018-04-19 (×10): 4 mg via INTRAVENOUS
  Filled 2018-04-16 (×11): qty 2

## 2018-04-16 MED ORDER — ACETAMINOPHEN 325 MG PO TABS: 650.0000 mg | ORAL_TABLET | Freq: Four times a day (QID) | ORAL | Status: DC | PRN

## 2018-04-16 MED ORDER — MORPHINE SULFATE (PF) 4 MG/ML IV SOLN
4.0000 mg | INTRAVENOUS | Status: DC | PRN
  Filled 2018-04-16: qty 1

## 2018-04-16 NOTE — ED Notes (Signed)
ED TO INPATIENT HANDOFF REPORT  Name/Age/Gender Kristen Ramos 58 y.o. female  Code Status   Home/SNF/Other Home  Chief Complaint Nausea/Emesis/Abd. Pain  Level of Care/Admitting Diagnosis ED Disposition    ED Disposition Condition Comment   Admit  [facility] [[id number]]  Level of Care: Med-Surg [16]  Diagnosis: Nausea & vomiting [277057]  Admitting Physician: Synetta FailJOHNSON-PITTS, ENDIA [[id number]]  Attending Physician: Synetta FailJOHNSON-PITTS, ENDIA [[id number]]  PT Class (Do Not Modify): Observation [104]  PT Acc Code (Do Not Modify): Observation [10022]       Medical History Past Medical History:  Diagnosis Date  . Arthritis   . Chronic back pain     Allergies Allergies  Allergen Reactions  . Bee Venom Swelling    IV Location/Drains/Wounds Patient Lines/Drains/Airways Status   Active Line/Drains/Airways    Name:   Placement date:   Placement time:   Site:   Days:   Peripheral IV 04/15/18 Right Hand   04/15/18    1708    Hand   1          Labs/Imaging Results for orders placed or performed during the hospital encounter of 04/15/18 (from the past 48 hour(s))  CBC with Differential     Status: None   Collection Time: 04/15/18  5:19 PM  Result Value Ref Range   WBC 6.1 4.0 - 10.5 K/uL   RBC 4.74 3.87 - 5.11 MIL/uL   Hemoglobin 14.2 12.0 - 15.0 g/dL   HCT 19.142.6 47.836.0 - 29.546.0 %   MCV 89.9 80.0 - 100.0 fL   MCH 30.0 26.0 - 34.0 pg   MCHC 33.3 30.0 - 36.0 g/dL   RDW 62.112.7 30.811.5 - 65.715.5 %   Platelets 239 150 - 400 K/uL   nRBC 0.0 0.0 - 0.2 %   Neutrophils Relative % 80 %   Neutro Abs 4.9 1.7 - 7.7 K/uL   Lymphocytes Relative 17 %   Lymphs Abs 1.0 0.7 - 4.0 K/uL   Monocytes Relative 2 %   Monocytes Absolute 0.1 0.1 - 1.0 K/uL   Eosinophils Relative 0 %   Eosinophils Absolute 0.0 0.0 - 0.5 K/uL   Basophils Relative 1 %   Basophils Absolute 0.0 0.0 - 0.1 K/uL   Immature Granulocytes 0 %   Abs Immature Granulocytes 0.02 0.00 - 0.07 K/uL   Comment: Performed at [facility], [address] [place], [place] [id number]  Comprehensive metabolic panel     Status: Abnormal   Collection Time: 04/15/18  5:19 PM  Result Value Ref Range   Sodium 140 135 - 145 mmol/L   Potassium 3.6 3.5 - 5.1 mmol/L   Chloride 109 98 - 111 mmol/L   CO2 21 (L) 22 - 32 mmol/L   Glucose, Bld 158 (H) 70 - 99 mg/dL   BUN 9 6 - 20 mg/dL   Creatinine, Ser 2.950.70 0.44 - 1.00 mg/dL   Calcium 9.6 8.9 - 28.410.3 mg/dL   Total Protein 8.2 (H) 6.5 - 8.1 g/dL   Albumin 4.4 3.5 - 5.0 g/dL   AST 21 15 - 41 U/L   ALT 21 0 - 44 U/L   Alkaline Phosphatase 85 38 - 126 U/L   Total Bilirubin 0.5 0.3 - 1.2 mg/dL   GFR calc non Af Amer >60 >60 mL/min   GFR calc Af Amer >60 >60 mL/min   Anion gap 10 5 - 15    Comment: Performed at [facility], [address]  [place], [place] [address]  Lipase, blood     Status: None   Collection Time: 04/15/18  5:19 PM  Result Value Ref Range   Lipase 36 11 - 51 U/L    Comment: Performed at [facility], [address] [place], [place] [address]   Ct Abdomen Pelvis W Contrast  Result Date: 04/15/2018 CLINICAL DATA:  58 year old female with bilious vomiting. Complaint of vertigo. EXAM: CT ABDOMEN AND PELVIS WITH CONTRAST TECHNIQUE: Multidetector CT imaging of the abdomen and pelvis was performed using the standard protocol following bolus administration of intravenous contrast. CONTRAST:  ISOVUE-300 IOPAMIDOL (ISOVUE-300) INJECTION 61% COMPARISON:  CT Abdomen and Pelvis 02/12/2018. FINDINGS: Lower chest: Cardiac size at the upper limits of normal. Negative lung bases. No pericardial or pleural effusion. Hepatobiliary: Negative gallbladder, Phrygian cap (normal variant). Liver enhancement is within normal limits. There is prominence of the CBD measuring about 12 millimeters diameter (coronal image 35) which is unchanged from December. There is associated mild left intrahepatic  biliary ductal dilatation (series 2, image 16). No obstructing etiology is identified. Pancreas: The pancreas appears normal, with no main pancreatic ductal dilatation. Spleen: Negative. Adrenals/Urinary Tract: Stable thickened left adrenal gland with relatively preserved adreniform shape. The right adrenal is normal. Bilateral renal enhancement and contrast excretion is symmetric and normal. Normal proximal ureters. Distended (685 milliliters) but otherwise unremarkable urinary bladder. Similar bladder distension in December. Stomach/Bowel: Negative rectum with mild retained stool. Negative sigmoid colon aside from redundancy. Decompressed descending and transverse colon. Largely decompressed right colon with moderate diverticulosis, but no active inflammation. Normal appendix (coronal image 33). Negative terminal ileum. No dilated or abnormal small bowel. There might be a small gastric hiatal hernia, but the stomach is fairly decompressed and otherwise normal. Negative duodenum. No mesenteric inflammation. No free air, free fluid. Vascular/Lymphatic: Aortoiliac calcified atherosclerosis. Major arterial structures in the abdomen and pelvis are patent. Portal venous system is patent. No lymphadenopathy. Reproductive: Negative. Other: No pelvic free fluid. Musculoskeletal: Chronic lower lumbar decompression and fusion. Hardware appears stable. No acute osseous abnormality identified. IMPRESSION: 1. Persistent CBD enlargement, and left intrahepatic biliary ductal enlargement to a lesser extent. But this is unchanged since December and no obstructing etiology is identified. Query hyperbilirubinemia. ERCP or MRCP may be valuable. 2. No acute or inflammatory process identified in the abdomen or pelvis. 3. Distended (685 mL) but otherwise normal urinary bladder. 4. Aortic Atherosclerosis (ICD10-I70.0). Electronically Signed   By: Odessa Fleming M.D.   On: 04/15/2018 22:14   None  Pending Labs Unresulted Labs (From  admission, onward)    Start     Ordered   Signed and Held  HIV antibody (Routine Testing)  Once,   R     Signed and Held          Vitals/Pain Today's Vitals   04/15/18 1715 04/15/18 2015 04/15/18 2035 04/15/18 2334  BP: (!) 141/102  (!) 150/84 (!) 152/94  Pulse: 74  74 73  Resp: (!) Temp: 97.9 F (36.6 C)     TempSrc: Oral     SpO2: 100%  100% 99%  PainSc: 10-Worst pain ever Asleep      Isolation Precautions No active isolations  Medications Medications  haloperidol lactate (HALDOL) injection 2 mg (2 mg Intravenous Given 04/15/18 1737)  ondansetron (ZOFRAN) injection 4 mg (4 mg Intravenous Given 04/15/18 1737)  sodium chloride 0.9 % bolus 1,000 mL (0 mLs Intravenous Stopped 04/15/18 1853)  fentaNYL (SUBLIMAZE) injection 50 mcg (  50 mcg Intravenous Given 04/15/18 1856)  promethazine (PHENERGAN) injection 25 mg (25 mg Intravenous Given 04/15/18 1855)  metoCLOPramide (REGLAN) injection 10 mg (10 mg Intravenous Given 04/15/18 2225)  iopamidol (ISOVUE-300) 61 % injection 100 mL (100 mLs Intravenous Contrast Given 04/15/18 2143)    Mobility walks

## 2018-04-16 NOTE — Progress Notes (Signed)
MRI took pt down at 2020. MRI reported that they attempted to get the imaging, but pt would not lay on her back and kept falling asleep, so she was unable to follow breathing instructions. They called and asked if she had been given any sedating medications in the last hour and I reported that morphine was given at 1811 and zofran at 1535.  MRI has returned pt to her room.

## 2018-04-16 NOTE — Care Management Obs Status (Signed)
MEDICARE OBSERVATION STATUS NOTIFICATION   Patient Details  Name: Antwanique Juncker MRN: [id number] Date of Birth: 25-Jun-1960   Medicare Observation Status Notification Given:  Yes    MahabirOlegario Messier, RN 04/16/2018, 12:28 PM

## 2018-04-16 NOTE — Progress Notes (Signed)
Attempted to place a PIV x 3,unsuccessful. Patient unable to relax, tensed up and somewhat fidgety. Bilateral arms assessed with an [place], no suitable vein to stick. RN present at bedside and will call MD for an alternative line.  Hulda Marin RN IV VAST TEAM

## 2018-04-16 NOTE — Progress Notes (Signed)
Unable to complete admission questions because pt is unwilling to answer questions.

## 2018-04-16 NOTE — Progress Notes (Signed)
IV nurse could not gain IV access and is recommending for pt to have a PICC placed.MD Notified.

## 2018-04-16 NOTE — Progress Notes (Signed)
[facility] PROGRESS NOTE  Silvana NewnessCharlene Kluver GNF:[id number] DOB: 10-22-1960 DOA: 04/15/2018  PCP: Patient, No Pcp Per  Brief History/Interval Summary: 58 y.o. female with medical history significant of rheumatoid arthritis, chronic pain presented with nausea and vomiting.  Per documentation by EMS patient complained of nausea vomiting and some dizziness.  She had a similar episode about 5 months ago.  Patient received numerous doses of antiemetics in the ED before her symptoms were controlled.  Her blood work is unremarkable.   Reason for Visit: Intractable nausea and vomiting  Consultants: None  Procedures: None  Antibiotics: None  Subjective/Interval History: Patient states that she vomited this morning.  Confirmed by the nurse.  This was apparently bilious.  No blood in the emesis.  Continues to have upper abdominal pain.  ROS: Denies any chest pain or shortness of breath.  Objective:  Vital Signs  Vitals:   04/15/18 2334 04/16/18 0226 04/16/18 0234 04/16/18 0540  BP: (!) 152/94  (!) 141/64 (!) 144/71  Pulse: 73  75 79  Resp: 16   18  Temp:   97.8 F (36.6 C) 99.6 F (37.6 C)  TempSrc:   Oral Oral  SpO2: 99%  100% 99%  Weight:  86.5 kg    Height:  5\' 5"  (1.651 m)      Intake/Output Summary (Last 24 hours) at 04/16/2018 1314 Last data filed at 04/16/2018 1000 Gross per 24 hour  Intake 735.97 ml  Output 600 ml  Net 135.97 ml   Filed Weights   04/16/18 0226  Weight: 86.5 kg    General appearance: alert, appears stated age, no distress and Uncooperative at times. Head: Normocephalic, without obvious abnormality, atraumatic Resp: clear to auscultation bilaterally Cardio: regular rate and rhythm, S1, S2 normal, no murmur, click, rub or gallop GI: Abdomen is soft.  Tenderness in the upper abdomen more so in the epigastric area without any rebound rigidity or guarding.  No masses or organomegaly.  Bowel sounds present. Extremities: extremities normal, atraumatic,  no cyanosis or edema Pulses: 2+ and symmetric Neurologic: No focal neurological deficits.  Lab Results:  Data Reviewed: I have personally reviewed following labs and imaging studies  CBC: Recent Labs  Lab 04/15/18 1719  WBC 6.1  NEUTROABS 4.9  HGB 14.2  HCT 42.6  MCV 89.9  PLT 239    Basic Metabolic Panel: Recent Labs  Lab 04/15/18 1719  NA 140  K 3.6  CL 109  CO2 21*  GLUCOSE 158*  BUN 9  CREATININE 0.70  CALCIUM 9.6    GFR: Estimated Creatinine Clearance: 85.3 mL/min (by C-G formula based on SCr of 0.7 mg/dL).  Liver Function Tests: Recent Labs  Lab 04/15/18 1719  AST 21  ALT 21  ALKPHOS 85  BILITOT 0.5  PROT 8.2*  ALBUMIN 4.4    Recent Labs  Lab 04/15/18 1719  LIPASE 36      Radiology Studies: Ct Abdomen Pelvis W Contrast  Result Date: 04/15/2018 CLINICAL DATA:  58 year old female with bilious vomiting. Complaint of vertigo. EXAM: CT ABDOMEN AND PELVIS WITH CONTRAST TECHNIQUE: Multidetector CT imaging of the abdomen and pelvis was performed using the standard protocol following bolus administration of intravenous contrast. CONTRAST:  100mL ISOVUE-300 IOPAMIDOL (ISOVUE-300) INJECTION 61% COMPARISON:  CT Abdomen and Pelvis 02/12/2018. FINDINGS: Lower chest: Cardiac size at the upper limits of normal. Negative lung bases. No pericardial or pleural effusion. Hepatobiliary: Negative gallbladder, Phrygian cap (normal variant). Liver enhancement is within normal limits. There is prominence of the  CBD measuring about 12 millimeters diameter (coronal image 35) which is unchanged from December. There is associated mild left intrahepatic biliary ductal dilatation (series 2, image 16). No obstructing etiology is identified. Pancreas: The pancreas appears normal, with no main pancreatic ductal dilatation. Spleen: Negative. Adrenals/Urinary Tract: Stable thickened left adrenal gland with relatively preserved adreniform shape. The right adrenal is normal. Bilateral  renal enhancement and contrast excretion is symmetric and normal. Normal proximal ureters. Distended (685 milliliters) but otherwise unremarkable urinary bladder. Similar bladder distension in December. Stomach/Bowel: Negative rectum with mild retained stool. Negative sigmoid colon aside from redundancy. Decompressed descending and transverse colon. Largely decompressed right colon with moderate diverticulosis, but no active inflammation. Normal appendix (coronal image 33). Negative terminal ileum. No dilated or abnormal small bowel. There might be a small gastric hiatal hernia, but the stomach is fairly decompressed and otherwise normal. Negative duodenum. No mesenteric inflammation. No free air, free fluid. Vascular/Lymphatic: Aortoiliac calcified atherosclerosis. Major arterial structures in the abdomen and pelvis are patent. Portal venous system is patent. No lymphadenopathy. Reproductive: Negative. Other: No pelvic free fluid. Musculoskeletal: Chronic lower lumbar decompression and fusion. Hardware appears stable. No acute osseous abnormality identified. IMPRESSION: 1. Persistent CBD enlargement, and left intrahepatic biliary ductal enlargement to a lesser extent. But this is unchanged since December and no obstructing etiology is identified. Query hyperbilirubinemia. ERCP or MRCP may be valuable. 2. No acute or inflammatory process identified in the abdomen or pelvis. 3. Distended (685 mL) but otherwise normal urinary bladder. 4. Aortic Atherosclerosis (ICD10-I70.0). Electronically Signed   By: Odessa FlemingH  Hall M.D.   On: 04/15/2018 22:14   [place] Ekg Site Rite  Result Date: 04/16/2018 If Site Rite image not attached, placement could not be confirmed due to current cardiac rhythm.    Medications:  Scheduled:  Continuous: . 0.9 % NaCl with KCl 20 mEq / L Stopped (04/16/18 0555)   ZOX:[id number]:acetaminophen **OR** acetaminophen, morphine injection, ondansetron **OR** ondansetron (ZOFRAN) IV, oxyCODONE, polyethylene  glycol    Assessment/Plan:    Intractable nausea and vomiting Etiology for her symptoms is not entirely clear.  She was hospitalized in December for similar complaints.  CT scan of the abdomen pelvis does show evidence for CBD enlargement.  This is similar to the findings noted on CT scan done in December.  Gallbladder was not noted to be abnormal in appearance.  However AST ALT were normal.  Alkaline phosphatase normal.  Bilirubin was also normal.  Differential diagnosis for her symptoms include acute gastritis.  Unlikely to be gastroenteritis as there is no evidence for diarrhea.  Patient denies drinking alcohol.  No use of NSAIDs.  We will place her on PPI for now.  Due to these abnormalities noted on CT scan she may benefit from undergoing MRCP which was recommended during the last hospitalization as well.  We will proceed with the same for now.  Lipase was normal.  Recheck labs tomorrow.  IV fluids.  Symptomatic treatment to continue as she continues to have nausea and vomiting and unable to take anything by mouth.  History of rheumatoid arthritis Not noted to be on any disease modifying agents at home.  Continue to monitor.    DVT Prophylaxis: SCD    Code Status: Full code Family Communication: Discussed with the patient Disposition Plan: Management as outlined above.    LOS: 0 days   Gerik Coberly [employer]Krishnan  [facility] Pager on [email or web address]  04/16/2018, 1:14 PM

## 2018-04-17 ENCOUNTER — Inpatient Hospital Stay (HOSPITAL_COMMUNITY): Payer: Medicare Other

## 2018-04-17 DIAGNOSIS — R112 Nausea with vomiting, unspecified: Secondary | ICD-10-CM | POA: Diagnosis present

## 2018-04-17 DIAGNOSIS — M1990 Unspecified osteoarthritis, unspecified site: Secondary | ICD-10-CM | POA: Diagnosis present

## 2018-04-17 DIAGNOSIS — G8929 Other chronic pain: Secondary | ICD-10-CM | POA: Diagnosis present

## 2018-04-17 DIAGNOSIS — M069 Rheumatoid arthritis, unspecified: Secondary | ICD-10-CM | POA: Diagnosis present

## 2018-04-17 DIAGNOSIS — R1013 Epigastric pain: Secondary | ICD-10-CM

## 2018-04-17 DIAGNOSIS — F17210 Nicotine dependence, cigarettes, uncomplicated: Secondary | ICD-10-CM | POA: Diagnosis present

## 2018-04-17 DIAGNOSIS — Z91030 Bee allergy status: Secondary | ICD-10-CM | POA: Diagnosis not present

## 2018-04-17 DIAGNOSIS — Z79899 Other long term (current) drug therapy: Secondary | ICD-10-CM | POA: Diagnosis not present

## 2018-04-17 DIAGNOSIS — K839 Disease of biliary tract, unspecified: Secondary | ICD-10-CM | POA: Diagnosis not present

## 2018-04-17 DIAGNOSIS — M0520 Rheumatoid vasculitis with rheumatoid arthritis of unspecified site: Secondary | ICD-10-CM | POA: Diagnosis not present

## 2018-04-17 DIAGNOSIS — K838 Other specified diseases of biliary tract: Secondary | ICD-10-CM | POA: Diagnosis present

## 2018-04-17 DIAGNOSIS — M549 Dorsalgia, unspecified: Secondary | ICD-10-CM | POA: Diagnosis present

## 2018-04-17 DIAGNOSIS — Z79891 Long term (current) use of opiate analgesic: Secondary | ICD-10-CM | POA: Diagnosis not present

## 2018-04-17 LAB — COMPREHENSIVE METABOLIC PANEL
ALT: 27 U/L (ref 0–44)
AST: 27 U/L (ref 15–41)
Albumin: 4.2 g/dL (ref 3.5–5.0)
Alkaline Phosphatase: 76 U/L (ref 38–126)
Anion gap: 9 (ref 5–15)
BUN: 14 mg/dL (ref 6–20)
CO2: 23 mmol/L (ref 22–32)
Calcium: 9.1 mg/dL (ref 8.9–10.3)
Chloride: 106 mmol/L (ref 98–111)
Creatinine, Ser: 0.69 mg/dL (ref 0.44–1.00)
GFR calc Af Amer: >60 mL/min (ref >60)
GFR calc non Af Amer: >60 mL/min (ref >60)
Glucose, Bld: 126 mg/dL — ABNORMAL HIGH (ref 70–99)
Potassium: 3.9 mmol/L (ref 3.5–5.1)
Sodium: 138 mmol/L (ref 135–145)
Total Bilirubin: 1.3 mg/dL — ABNORMAL HIGH (ref 0.3–1.2)
Total Protein: 8.1 g/dL (ref 6.5–8.1)

## 2018-04-17 LAB — CBC
HCT: 44.7 % (ref 36.0–46.0)
HEMOGLOBIN: 14.8 g/dL (ref 12.0–15.0)
MCH: 30.5 pg (ref 26.0–34.0)
MCHC: 33.1 g/dL (ref 30.0–36.0)
MCV: 92 fL (ref 80.0–100.0)
Platelets: 235 10*3/uL (ref 150–400)
RBC: 4.86 MIL/uL (ref 3.87–5.11)
RDW: 13.2 % (ref 11.5–15.5)
WBC: 8.6 10*3/uL (ref 4.0–10.5)
nRBC: 0 % (ref 0.0–0.2)

## 2018-04-17 MED ORDER — SODIUM CHLORIDE 0.45 % IV SOLN
INTRAVENOUS | Status: DC
  Administered 2018-04-17 – 2018-04-18 (×2): via INTRAVENOUS
  Administered 2018-04-19: 1000 mL via INTRAVENOUS
  Administered 2018-04-19: 18:00:00 via INTRAVENOUS

## 2018-04-17 MED ORDER — LORAZEPAM 2 MG/ML IJ SOLN
1.0000 mg | Freq: Once | INTRAMUSCULAR | Status: AC | PRN
  Administered 2018-04-17: 1 mg via INTRAVENOUS
  Filled 2018-04-17: qty 1

## 2018-04-17 NOTE — Progress Notes (Signed)
[facility] PROGRESS NOTE  Kristen Ramos UXN:[id number] DOB: 05/08/1960 DOA: 04/15/2018  PCP: Patient, No Pcp Per  Brief History/Interval Summary: 58 y.o. female with medical history significant of rheumatoid arthritis, chronic pain presented with nausea and vomiting.  Per documentation by EMS patient complained of nausea vomiting and some dizziness.  She had a similar episode about 5 months ago.  Patient received numerous doses of antiemetics in the ED before her symptoms were controlled.  Her blood work was unremarkable.   Reason for Visit: Intractable nausea and vomiting  Consultants: None  Procedures: None  Antibiotics: None  Subjective/Interval History: Patient continues to have nausea and vomiting.  It looks like she vomited at least 3 times in the last 24 hours.  This has been bilious.  Continues to have upper abdominal pain.    ROS: Denies any chest pain or shortness of breath.  Objective:  Vital Signs  Vitals:   04/16/18 0540 04/16/18 1353 04/16/18 2015 04/17/18 0622  BP: (!) 144/71 138/66 139/81 (!) 154/85  Pulse: 79 67 82 79  Resp: 18 16 18 18   Temp: 99.6 F (37.6 C) 98.9 F (37.2 C) 99.6 F (37.6 C) 98.6 F (37 C)  TempSrc: Oral Oral Oral Oral  SpO2: 99% 100% 97% 95%  Weight:      Height:        Intake/Output Summary (Last 24 hours) at 04/17/2018 1205 Last data filed at 04/17/2018 1100 Gross per 24 hour  Intake 764.48 ml  Output 1000 ml  Net -235.52 ml   Filed Weights   04/16/18 0226  Weight: 86.5 kg   General appearance: Awake alert.  In no distress Resp: Clear to auscultation bilaterally.  Normal effort Cardio: S1-S2 is normal regular.  No S3-S4.  No rubs murmurs or bruit GI: Abdomen soft.  Mild tenderness appreciated in the upper abdomen more so in the epigastric area.  No rebound rigidity or guarding.  No masses organomegaly.  Bowel sounds present normal.   Extremities: No edema.  Full range of motion of lower extremities. Neurologic:  Alert and oriented x3.  No focal neurological deficits.    Lab Results:  Data Reviewed: I have personally reviewed following labs and imaging studies  CBC: Recent Labs  Lab 04/15/18 1719 04/17/18 0451  WBC 6.1 8.6  NEUTROABS 4.9  --   HGB 14.2 14.8  HCT 42.6 44.7  MCV 89.9 92.0  PLT 239 235    Basic Metabolic Panel: Recent Labs  Lab 04/15/18 1719 04/17/18 0451  NA 140 138  K 3.6 3.9  CL 109 106  CO2 21* 23  GLUCOSE 158* 126*  BUN 9 14  CREATININE 0.70 0.69  CALCIUM 9.6 9.1    GFR: Estimated Creatinine Clearance: 85.3 mL/min (by C-G formula based on SCr of 0.69 mg/dL).  Liver Function Tests: Recent Labs  Lab 04/15/18 1719 04/17/18 0451  AST 21 27  ALT 21 27  ALKPHOS 85 76  BILITOT 0.5 1.3*  PROT 8.2* 8.1  ALBUMIN 4.4 4.2    Recent Labs  Lab 04/15/18 1719  LIPASE 36      Radiology Studies: Ct Abdomen Pelvis W Contrast  Result Date: 04/15/2018 CLINICAL DATA:  58 year old female with bilious vomiting. Complaint of vertigo. EXAM: CT ABDOMEN AND PELVIS WITH CONTRAST TECHNIQUE: Multidetector CT imaging of the abdomen and pelvis was performed using the standard protocol following bolus administration of intravenous contrast. CONTRAST:  ISOVUE-300 IOPAMIDOL (ISOVUE-300) INJECTION 61% COMPARISON:  CT Abdomen and Pelvis 02/12/2018. FINDINGS:  Lower chest: Cardiac size at the upper limits of normal. Negative lung bases. No pericardial or pleural effusion. Hepatobiliary: Negative gallbladder, Phrygian cap (normal variant). Liver enhancement is within normal limits. There is prominence of the CBD measuring about 12 millimeters diameter (coronal image 35) which is unchanged from December. There is associated mild left intrahepatic biliary ductal dilatation (series 2, image 16). No obstructing etiology is identified. Pancreas: The pancreas appears normal, with no main pancreatic ductal dilatation. Spleen: Negative. Adrenals/Urinary Tract: Stable thickened left  adrenal gland with relatively preserved adreniform shape. The right adrenal is normal. Bilateral renal enhancement and contrast excretion is symmetric and normal. Normal proximal ureters. Distended (685 milliliters) but otherwise unremarkable urinary bladder. Similar bladder distension in December. Stomach/Bowel: Negative rectum with mild retained stool. Negative sigmoid colon aside from redundancy. Decompressed descending and transverse colon. Largely decompressed right colon with moderate diverticulosis, but no active inflammation. Normal appendix (coronal image 33). Negative terminal ileum. No dilated or abnormal small bowel. There might be a small gastric hiatal hernia, but the stomach is fairly decompressed and otherwise normal. Negative duodenum. No mesenteric inflammation. No free air, free fluid. Vascular/Lymphatic: Aortoiliac calcified atherosclerosis. Major arterial structures in the abdomen and pelvis are patent. Portal venous system is patent. No lymphadenopathy. Reproductive: Negative. Other: No pelvic free fluid. Musculoskeletal: Chronic lower lumbar decompression and fusion. Hardware appears stable. No acute osseous abnormality identified. IMPRESSION: 1. Persistent CBD enlargement, and left intrahepatic biliary ductal enlargement to a lesser extent. But this is unchanged since December and no obstructing etiology is identified. Query hyperbilirubinemia. ERCP or MRCP may be valuable. 2. No acute or inflammatory process identified in the abdomen or pelvis. 3. Distended (685 mL) but otherwise normal urinary bladder. 4. Aortic Atherosclerosis (ICD10-I70.0). Electronically Signed   By: Odessa Fleming M.D.   On: 04/15/2018 22:14   [place] Ekg Site Rite  Result Date: 04/16/2018 If Site Rite image not attached, placement could not be confirmed due to current cardiac rhythm.    Medications:  Scheduled: . pantoprazole (PROTONIX) IV  40 mg Intravenous Q12H   Followed by  . [START ON 04/18/2018] pantoprazole  (PROTONIX) IV  40 mg Intravenous Q24H   Continuous:  SRP:[id number] **OR** acetaminophen, LORazepam, morphine injection, ondansetron **OR** ondansetron (ZOFRAN) IV, oxyCODONE, polyethylene glycol, sodium chloride flush     Assessment/Plan:  Intractable nausea and vomiting Etiology for her symptoms is not entirely clear.  She was hospitalized in December for similar complaints.  CT scan of the abdomen pelvis does show evidence for CBD enlargement.  This is similar to the findings noted on CT scan done in December.  Gallbladder was not noted to be abnormal in appearance.  However AST ALT were normal.  Alkaline phosphatase normal.  Bilirubin was normal.  Noted to be mildly elevated this morning.  EKG did not show any ischemic changes.  Differential diagnosis for her symptoms include acute gastritis, biliary process.  Unlikely to be gastroenteritis as there is no evidence for diarrhea.  Patient denies drinking alcohol.  No use of NSAIDs.  Continue with PPI.  MRCP was ordered however it appears that patient was experiencing nausea and could not lay still for the test last night.  Will need to be reattempted today.  Patient told that she will need to cooperate with this examination.    History of rheumatoid arthritis Not noted to be on any disease modifying agents at home.  Continue to monitor.  HIV nonreactive.    DVT Prophylaxis: SCD    Code Status:  Full code Family Communication: Discussed with the patient Disposition Plan: Await MRCP.    LOS: 0 days   Renelda Kilian [employer]  [facility] Pager on [email or web address]  04/17/2018, 12:05 PM

## 2018-04-18 DIAGNOSIS — K839 Disease of biliary tract, unspecified: Secondary | ICD-10-CM

## 2018-04-18 LAB — COMPREHENSIVE METABOLIC PANEL
ALK PHOS: 72 U/L (ref 38–126)
ALT: 51 U/L — ABNORMAL HIGH (ref 0–44)
AST: 45 U/L — ABNORMAL HIGH (ref 15–41)
Albumin: 3.8 g/dL (ref 3.5–5.0)
Anion gap: 8 (ref 5–15)
BUN: 13 mg/dL (ref 6–20)
CO2: 22 mmol/L (ref 22–32)
Calcium: 8.7 mg/dL — ABNORMAL LOW (ref 8.9–10.3)
Chloride: 103 mmol/L (ref 98–111)
Creatinine, Ser: 0.65 mg/dL (ref 0.44–1.00)
GFR calc Af Amer: >60 mL/min (ref >60)
GFR calc non Af Amer: >60 mL/min (ref >60)
Glucose, Bld: 109 mg/dL — ABNORMAL HIGH (ref 70–99)
Potassium: 3.7 mmol/L (ref 3.5–5.1)
SODIUM: 133 mmol/L — AB (ref 135–145)
Total Bilirubin: 2 mg/dL — ABNORMAL HIGH (ref 0.3–1.2)
Total Protein: 7.2 g/dL (ref 6.5–8.1)

## 2018-04-18 LAB — CBC
HCT: 42.7 % (ref 36.0–46.0)
Hemoglobin: 14.1 g/dL (ref 12.0–15.0)
MCH: 29.6 pg (ref 26.0–34.0)
MCHC: 33 g/dL (ref 30.0–36.0)
MCV: 89.5 fL (ref 80.0–100.0)
Platelets: 236 10*3/uL (ref 150–400)
RBC: 4.77 MIL/uL (ref 3.87–5.11)
RDW: 12.4 % (ref 11.5–15.5)
WBC: 6.6 10*3/uL (ref 4.0–10.5)
nRBC: 0 % (ref 0.0–0.2)

## 2018-04-18 NOTE — H&P (View-Only) (Signed)
Referring Provider: Dr. Rito EhrlichKrishnan Primary Care Physician:  Patient, No Pcp Per Primary Gastroenterologist:  Gentry FitzUnassigned  Reason for Consultation:  Nausea and vomiting; Abnormal CT of biliary system  HPI: Kristen NewnessCharlene Ramos is a 58 y.o. female with recurrent bouts of intractable N/V since December that will last 3-4 days at a time. Reports intermittent abdominal pain as well. Several times she turned away and retched and vomited into the trash can during my evaluation. On a CT (12/19 and 2/20) and MRCP have shown a dilated common bile duct up to 1.2 cm without a stricture or stone visualized. LFTs wnl on admit and TB 2.0, ALP 72, AST 45, ALT 51 today. Hgb 14.1. No history of melena or hematochezia.  Past Medical History:  Diagnosis Date  . Arthritis   . Chronic back pain     Past Surgical History:  Procedure Laterality Date  . ABDOMINAL HYSTERECTOMY    . BACK SURGERY    . CESAREAN SECTION      Prior to Admission medications   Medication Sig Start Date End Date Taking? Authorizing Provider  oxyCODONE (ROXICODONE) 15 MG immediate release tablet Take 15 mg by mouth 4 (four) times daily as needed for pain.  02/03/18  Yes [provider]  tiZANidine (ZANAFLEX) 4 MG capsule Take 4 mg by mouth 2 (two) times daily as needed for muscle spasms. 03/25/18  Yes [provider]  ondansetron (ZOFRAN-ODT) 4 MG disintegrating tablet 8mg  ODT q8 hours prn nausea Patient not taking: Reported on 04/15/2018 02/12/18   Dhungel, Theda BelfastNishant, MD    Scheduled Meds: . pantoprazole (PROTONIX) IV  40 mg Intravenous Q24H   Continuous Infusions: . sodium chloride 75 mL/hr at 04/18/18 0920   PRN Meds:.acetaminophen **OR** acetaminophen, morphine injection, ondansetron **OR** ondansetron (ZOFRAN) IV, oxyCODONE, polyethylene glycol, sodium chloride flush  Allergies as of 04/15/2018 - Review Complete 04/15/2018  Allergen Reaction Noted  . Bee venom Swelling 12/13/2017    History reviewed. No pertinent  family history.  Social History   Socioeconomic History  . Marital status: Single    Spouse name: Not on file  . Number of children: Not on file  . Years of education: Not on file  . Highest education level: Not on file  Occupational History  . Not on file  Social Needs  . Financial resource strain: Not on file  . Food insecurity:    Worry: Not on file    Inability: Not on file  . Transportation needs:    Medical: Not on file    Non-medical: Not on file  Tobacco Use  . Smoking status: Current Every Day Smoker    Types: Cigarettes  . Smokeless tobacco: Never Used  Substance and Sexual Activity  . Alcohol use: No  . Drug use: No  . Sexual activity: Not on file  Lifestyle  . Physical activity:    Days per week: Not on file    Minutes per session: Not on file  . Stress: Not on file  Relationships  . Social connections:    Talks on phone: Not on file    Gets together: Not on file    Attends religious service: Not on file    Active member of club or organization: Not on file    Attends meetings of clubs or organizations: Not on file    Relationship status: Not on file  . Intimate partner violence:    Fear of current or ex partner: Not on file    Emotionally abused: Not on  file    Physically abused: Not on file    Forced sexual activity: Not on file  Other Topics Concern  . Not on file  Social History Narrative  . Not on file    Review of Systems: All negative except as stated above in HPI.  Physical Exam: Vital signs: Vitals:   04/17/18 2127 04/18/18 0503  BP: (!) 158/84 (!) 158/95  Pulse: 74 82  Resp: 16 18  Temp: 99.3 F (37.4 C) 99.4 F (37.4 C)  SpO2: 99% 99%   Last BM Date: 04/15/18 General:   Uncomfortable, lethargic, obese Head: normocephalic, atraumatic Eyes: anicteric sclera ENT: oropharynx clear Neck: supple, nontender Lungs:  Clear throughout to auscultation.   No wheezes, crackles, or rhonchi. No acute distress. Heart:  Regular rate and  rhythm; no murmurs, clicks, rubs,  or gallops. Abdomen: epigastric tenderness with guarding, soft, nondistended, +BS  Rectal:  Deferred Ext: no edema  GI:  Lab Results: Recent Labs    04/15/18 1719 04/17/18 0451 04/18/18 0457  WBC 6.1 8.6 6.6  HGB 14.2 14.8 14.1  HCT 42.6 44.7 42.7  PLT 239 235 236   BMET Recent Labs    04/15/18 1719 04/17/18 0451 04/18/18 0457  NA 140 138 133*  K 3.6 3.9 3.7  CL 109 106 103  CO2 21* 23 22  GLUCOSE 158* 126* 109*  BUN 9 14 13   CREATININE 0.70 0.69 0.65  CALCIUM 9.6 9.1 8.7*   LFT Recent Labs    04/18/18 0457  PROT 7.2  ALBUMIN 3.8  AST 45*  ALT 51*  ALKPHOS 72  BILITOT 2.0*   PT/INR No results for input(s): LABPROT, INR in the last 72 hours.   Studies/Results: Mr Abdomen Mrcp Wo Contrast  Result Date: 04/17/2018 CLINICAL DATA:  Dilated common bile duct. EXAM: MRI ABDOMEN WITHOUT CONTRAST  (INCLUDING MRCP) TECHNIQUE: Multiplanar multisequence MR imaging of the abdomen was performed. Heavily T2-weighted images of the biliary and pancreatic ducts were obtained, and three-dimensional MRCP images were rendered by post processing. Patient reported to be uncooperative. Patient refused contrast material. COMPARISON:  04/15/2018 FINDINGS: Lower chest: No acute findings. Hepatobiliary: Diminished exam detail due to lack of IV contrast material. There is motion artifact identified on multiple sequences which diminishes exam detail. No focal liver abnormality the gallbladder appears unremarkable. Within the limitations described above there is no gallstone, gallbladder wall thickening or pericholecystic fluid identified. Mild intrahepatic bile duct dilatation. There is fusiform dilatation of the common bile duct which measures 1.6 cm in maximum diameter. On CT from 02/12/2018 this measured 1.3 cm. Beak like narrowing at the distal CBD is identified, image 23/6. No choledocholithiasis identified. Pancreas: Within the limitations of unenhanced  technique no focal mass lesion identified within the head of pancreas. No main duct dilatation or inflammation identified. Spleen:  Within normal limits in size and appearance. Adrenals/Urinary Tract: Normal right adrenal gland. Nodule in the left adrenal gland is again identified there is loss of signal within this nodule on the out of phase sequences compatible with benign adenoma. No hydronephrosis identified bilaterally. Stomach/Bowel: Visualized portions within the abdomen are unremarkable. Vascular/Lymphatic: Aortic atherosclerosis. No aneurysm. No abdominal adenopathy. Other:  No ascites or fluid collections identified. Musculoskeletal: No suspicious bone lesions identified. IMPRESSION: 1. Again seen is persistent common bile duct dilatation and mild intrahepatic duct dilatation. No obstructing stone or discrete mass identified. Note: Exam detail diminished by motion artifact and lack of IV contrast material. 2.  Aortic Atherosclerosis (ICD10-I70.0). Electronically Signed  By: Signa Kellaylor  Stroud M.D.   On: 04/17/2018 17:38    Impression/Plan: 58 yo with recurrent nausea/vomiting of unclear etiology who also has a dilated CBD with minimally elevated LFTs. EGD tomorrow by Dr. Marca AnconaKarki to further evaluate the nausea/vomiting. May need an outpt EUS to further evaluate the dilated CBD if the EGD is unrevealing. Ice chips. NPO p MN.     LOS: 1 day   Shirley FriarVincent C Ramell Wacha  04/18/2018, 1:04 PM  Questions please call [phone]

## 2018-04-18 NOTE — Anesthesia Preprocedure Evaluation (Signed)
Anesthesia Evaluation  Patient identified by MRN, date of birth, ID band Patient awake    Reviewed: Allergy & Precautions, H&P , NPO status , Patient's Chart, lab work & pertinent test results, reviewed documented beta blocker date and time   Airway Mallampati: II  TM Distance: >3 FB Neck ROM: full    Dental no notable dental hx.    Pulmonary neg pulmonary ROS, Current Smoker,    Pulmonary exam normal breath sounds clear to auscultation       Cardiovascular Exercise Tolerance: Good negative cardio ROS   Rhythm:regular Rate:Normal     Neuro/Psych negative neurological ROS  negative psych ROS   GI/Hepatic negative GI ROS, Neg liver ROS,   Endo/Other  negative endocrine ROS  Renal/GU negative Renal ROS  negative genitourinary   Musculoskeletal  (+) Arthritis , Rheumatoid disorders,    Abdominal   Peds negative pediatric ROS (+)  Hematology negative hematology ROS (+)   Anesthesia Other Findings   Reproductive/Obstetrics negative OB ROS                             Anesthesia Physical Anesthesia Plan  ASA: II  Anesthesia Plan: MAC   Post-op Pain Management:    Induction: Intravenous  PONV Risk Score and Plan: 2 and Treatment may vary due to age or medical condition  Airway Management Planned: Mask, Natural Airway and Nasal Cannula  Additional Equipment:   Intra-op Plan:   Post-operative Plan:   Informed Consent: I have reviewed the patients History and Physical, chart, labs and discussed the procedure including the risks, benefits and alternatives for the proposed anesthesia with the patient or authorized representative who has indicated his/her understanding and acceptance.     Dental Advisory Given  Plan Discussed with: CRNA, Anesthesiologist and Surgeon  Anesthesia Plan Comments:         Anesthesia Quick Evaluation

## 2018-04-18 NOTE — Consult Note (Signed)
Referring Provider: Dr. Krishnan Primary Care Physician:  Patient, No Pcp Per Primary Gastroenterologist:  Unassigned  Reason for Consultation:  Nausea and vomiting; Abnormal CT of biliary system  HPI: Kristen Ramos is a 58 y.o. female with recurrent bouts of intractable N/V since December that will last 3-4 days at a time. Reports intermittent abdominal pain as well. Several times she turned away and retched and vomited into the trash can during my evaluation. On a CT (12/19 and 2/20) and MRCP have shown a dilated common bile duct up to 1.2 cm without a stricture or stone visualized. LFTs wnl on admit and TB 2.0, ALP 72, AST 45, ALT 51 today. Hgb 14.1. No history of melena or hematochezia.  Past Medical History:  Diagnosis Date  . Arthritis   . Chronic back pain     Past Surgical History:  Procedure Laterality Date  . ABDOMINAL HYSTERECTOMY    . BACK SURGERY    . CESAREAN SECTION      Prior to Admission medications   Medication Sig Start Date End Date Taking? Authorizing Provider  oxyCODONE (ROXICODONE) 15 MG immediate release tablet Take 15 mg by mouth 4 (four) times daily as needed for pain.  02/03/18  Yes [provider]  tiZANidine (ZANAFLEX) 4 MG capsule Take 4 mg by mouth 2 (two) times daily as needed for muscle spasms. 03/25/18  Yes [provider]  ondansetron (ZOFRAN-ODT) 4 MG disintegrating tablet 8mg ODT q8 hours prn nausea Patient not taking: Reported on 04/15/2018 02/12/18   Dhungel, Nishant, MD    Scheduled Meds: . pantoprazole (PROTONIX) IV  40 mg Intravenous Q24H   Continuous Infusions: . sodium chloride 75 mL/hr at 04/18/18 0920   PRN Meds:.acetaminophen **OR** acetaminophen, morphine injection, ondansetron **OR** ondansetron (ZOFRAN) IV, oxyCODONE, polyethylene glycol, sodium chloride flush  Allergies as of 04/15/2018 - Review Complete 04/15/2018  Allergen Reaction Noted  . Bee venom Swelling 12/13/2017    History reviewed. No pertinent  family history.  Social History   Socioeconomic History  . Marital status: Single    Spouse name: Not on file  . Number of children: Not on file  . Years of education: Not on file  . Highest education level: Not on file  Occupational History  . Not on file  Social Needs  . Financial resource strain: Not on file  . Food insecurity:    Worry: Not on file    Inability: Not on file  . Transportation needs:    Medical: Not on file    Non-medical: Not on file  Tobacco Use  . Smoking status: Current Every Day Smoker    Types: Cigarettes  . Smokeless tobacco: Never Used  Substance and Sexual Activity  . Alcohol use: No  . Drug use: No  . Sexual activity: Not on file  Lifestyle  . Physical activity:    Days per week: Not on file    Minutes per session: Not on file  . Stress: Not on file  Relationships  . Social connections:    Talks on phone: Not on file    Gets together: Not on file    Attends religious service: Not on file    Active member of club or organization: Not on file    Attends meetings of clubs or organizations: Not on file    Relationship status: Not on file  . Intimate partner violence:    Fear of current or ex partner: Not on file    Emotionally abused: Not on   file    Physically abused: Not on file    Forced sexual activity: Not on file  Other Topics Concern  . Not on file  Social History Narrative  . Not on file    Review of Systems: All negative except as stated above in HPI.  Physical Exam: Vital signs: Vitals:   04/17/18 2127 04/18/18 0503  BP: (!) 158/84 (!) 158/95  Pulse: 74 82  Resp: 16 18  Temp: 99.3 F (37.4 C) 99.4 F (37.4 C)  SpO2: 99% 99%   Last BM Date: 04/15/18 General:   Uncomfortable, lethargic, obese Head: normocephalic, atraumatic Eyes: anicteric sclera ENT: oropharynx clear Neck: supple, nontender Lungs:  Clear throughout to auscultation.   No wheezes, crackles, or rhonchi. No acute distress. Heart:  Regular rate and  rhythm; no murmurs, clicks, rubs,  or gallops. Abdomen: epigastric tenderness with guarding, soft, nondistended, +BS  Rectal:  Deferred Ext: no edema  GI:  Lab Results: Recent Labs    04/15/18 1719 04/17/18 0451 04/18/18 0457  WBC 6.1 8.6 6.6  HGB 14.2 14.8 14.1  HCT 42.6 44.7 42.7  PLT 239 235 236   BMET Recent Labs    04/15/18 1719 04/17/18 0451 04/18/18 0457  NA 140 138 133*  K 3.6 3.9 3.7  CL 109 106 103  CO2 21* 23 22  GLUCOSE 158* 126* 109*  BUN 9 14 13   CREATININE 0.70 0.69 0.65  CALCIUM 9.6 9.1 8.7*   LFT Recent Labs    04/18/18 0457  PROT 7.2  ALBUMIN 3.8  AST 45*  ALT 51*  ALKPHOS 72  BILITOT 2.0*   PT/INR No results for input(s): LABPROT, INR in the last 72 hours.   Studies/Results: Mr Abdomen Mrcp Wo Contrast  Result Date: 04/17/2018 CLINICAL DATA:  Dilated common bile duct. EXAM: MRI ABDOMEN WITHOUT CONTRAST  (INCLUDING MRCP) TECHNIQUE: Multiplanar multisequence MR imaging of the abdomen was performed. Heavily T2-weighted images of the biliary and pancreatic ducts were obtained, and three-dimensional MRCP images were rendered by post processing. Patient reported to be uncooperative. Patient refused contrast material. COMPARISON:  04/15/2018 FINDINGS: Lower chest: No acute findings. Hepatobiliary: Diminished exam detail due to lack of IV contrast material. There is motion artifact identified on multiple sequences which diminishes exam detail. No focal liver abnormality the gallbladder appears unremarkable. Within the limitations described above there is no gallstone, gallbladder wall thickening or pericholecystic fluid identified. Mild intrahepatic bile duct dilatation. There is fusiform dilatation of the common bile duct which measures 1.6 cm in maximum diameter. On CT from 02/12/2018 this measured 1.3 cm. Beak like narrowing at the distal CBD is identified, image 23/6. No choledocholithiasis identified. Pancreas: Within the limitations of unenhanced  technique no focal mass lesion identified within the head of pancreas. No main duct dilatation or inflammation identified. Spleen:  Within normal limits in size and appearance. Adrenals/Urinary Tract: Normal right adrenal gland. Nodule in the left adrenal gland is again identified there is loss of signal within this nodule on the out of phase sequences compatible with benign adenoma. No hydronephrosis identified bilaterally. Stomach/Bowel: Visualized portions within the abdomen are unremarkable. Vascular/Lymphatic: Aortic atherosclerosis. No aneurysm. No abdominal adenopathy. Other:  No ascites or fluid collections identified. Musculoskeletal: No suspicious bone lesions identified. IMPRESSION: 1. Again seen is persistent common bile duct dilatation and mild intrahepatic duct dilatation. No obstructing stone or discrete mass identified. Note: Exam detail diminished by motion artifact and lack of IV contrast material. 2.  Aortic Atherosclerosis (ICD10-I70.0). Electronically Signed  By: Signa Kellaylor  Stroud M.D.   On: 04/17/2018 17:38    Impression/Plan: 58 yo with recurrent nausea/vomiting of unclear etiology who also has a dilated CBD with minimally elevated LFTs. EGD tomorrow by Dr. Marca AnconaKarki to further evaluate the nausea/vomiting. May need an outpt EUS to further evaluate the dilated CBD if the EGD is unrevealing. Ice chips. NPO p MN.     LOS: 1 day   Kristen Ramos  04/18/2018, 1:04 PM  Questions please call [phone]

## 2018-04-18 NOTE — Progress Notes (Signed)
[facility] PROGRESS NOTE  Kristen Ramos KXF:[id number] DOB: 1960-11-09 DOA: 04/15/2018  PCP: Patient, No Pcp Per  Brief History/Interval Summary: 58 y.o. female with medical history significant of rheumatoid arthritis, chronic pain presented with nausea and vomiting.  Per documentation by EMS patient complained of nausea vomiting and some dizziness.  She had a similar episode about 5 months ago.  Patient received numerous doses of antiemetics in the ED before her symptoms were controlled.  Her blood work was unremarkable.   Reason for Visit: Intractable nausea and vomiting  Consultants: Gastroenterology  Procedures: None  Antibiotics: None  Subjective/Interval History: Patient reports persistent symptoms of nausea and vomiting.  She vomited about 4 times yesterday.  This was green in color.  No blood in the emesis.  Continues to have upper abdominal discomfort.     ROS: Denies any shortness of breath  Objective:  Vital Signs  Vitals:   04/17/18 0622 04/17/18 1342 04/17/18 2127 04/18/18 0503  BP: (!) 154/85 (!) 158/81 (!) 158/84 (!) 158/95  Pulse: 79 72 74 82  Resp: 18 18 16 18   Temp: 98.6 F (37 C) 98.3 F (36.8 C) 99.3 F (37.4 C) 99.4 F (37.4 C)  TempSrc: Oral Oral Oral Oral  SpO2: 95% 99% 99% 99%  Weight:      Height:        Intake/Output Summary (Last 24 hours) at 04/18/2018 1112 Last data filed at 04/18/2018 0953 Gross per 24 hour  Intake 787.42 ml  Output 500 ml  Net 287.42 ml   Filed Weights   04/16/18 0226  Weight: 86.5 kg   General appearance: Awake alert.  In no distress Resp: Clear to auscultation bilaterally.  Normal effort Cardio: S1-S2 is normal regular.  No S3-S4.  No rubs murmurs or bruit GI: Abdomen is soft.  However epigastric tenderness is appreciated.  No rebound rigidity or guarding.  No masses organomegaly.  Bowel sounds present normal.   Extremities: No edema.  Full range of motion of lower extremities. Neurologic: Alert and  oriented x3.  No focal neurological deficits.    Lab Results:  Data Reviewed: I have personally reviewed following labs and imaging studies  CBC: Recent Labs  Lab 04/15/18 1719 04/17/18 0451 04/18/18 0457  WBC 6.1 8.6 6.6  NEUTROABS 4.9  --   --   HGB 14.2 14.8 14.1  HCT 42.6 44.7 42.7  MCV 89.9 92.0 89.5  PLT 239 235 236    Basic Metabolic Panel: Recent Labs  Lab 04/15/18 1719 04/17/18 0451 04/18/18 0457  NA 140 138 133*  K 3.6 3.9 3.7  CL 109 106 103  CO2 21* 23 22  GLUCOSE 158* 126* 109*  BUN 9 14 13   CREATININE 0.70 0.69 0.65  CALCIUM 9.6 9.1 8.7*    GFR: Estimated Creatinine Clearance: 85.3 mL/min (by C-G formula based on SCr of 0.65 mg/dL).  Liver Function Tests: Recent Labs  Lab 04/15/18 1719 04/17/18 0451 04/18/18 0457  AST 21 27 45*  ALT 21 27 51*  ALKPHOS 85 76 72  BILITOT 0.5 1.3* 2.0*  PROT 8.2* 8.1 7.2  ALBUMIN 4.4 4.2 3.8    Recent Labs  Lab 04/15/18 1719  LIPASE 36      Radiology Studies: Mr Abdomen Mrcp Wo Contrast  Result Date: 04/17/2018 CLINICAL DATA:  Dilated common bile duct. EXAM: MRI ABDOMEN WITHOUT CONTRAST  (INCLUDING MRCP) TECHNIQUE: Multiplanar multisequence MR imaging of the abdomen was performed. Heavily T2-weighted images of the biliary and pancreatic ducts  were obtained, and three-dimensional MRCP images were rendered by post processing. Patient reported to be uncooperative. Patient refused contrast material. COMPARISON:  04/15/2018 FINDINGS: Lower chest: No acute findings. Hepatobiliary: Diminished exam detail due to lack of IV contrast material. There is motion artifact identified on multiple sequences which diminishes exam detail. No focal liver abnormality the gallbladder appears unremarkable. Within the limitations described above there is no gallstone, gallbladder wall thickening or pericholecystic fluid identified. Mild intrahepatic bile duct dilatation. There is fusiform dilatation of the common bile duct which  measures 1.6 cm in maximum diameter. On CT from 02/12/2018 this measured 1.3 cm. Beak like narrowing at the distal CBD is identified, image 23/6. No choledocholithiasis identified. Pancreas: Within the limitations of unenhanced technique no focal mass lesion identified within the head of pancreas. No main duct dilatation or inflammation identified. Spleen:  Within normal limits in size and appearance. Adrenals/Urinary Tract: Normal right adrenal gland. Nodule in the left adrenal gland is again identified there is loss of signal within this nodule on the out of phase sequences compatible with benign adenoma. No hydronephrosis identified bilaterally. Stomach/Bowel: Visualized portions within the abdomen are unremarkable. Vascular/Lymphatic: Aortic atherosclerosis. No aneurysm. No abdominal adenopathy. Other:  No ascites or fluid collections identified. Musculoskeletal: No suspicious bone lesions identified. IMPRESSION: 1. Again seen is persistent common bile duct dilatation and mild intrahepatic duct dilatation. No obstructing stone or discrete mass identified. Note: Exam detail diminished by motion artifact and lack of IV contrast material. 2.  Aortic Atherosclerosis (ICD10-I70.0). Electronically Signed   By: Signa Kell M.D.   On: 04/17/2018 17:38     Medications:  Scheduled: . pantoprazole (PROTONIX) IV  40 mg Intravenous Q24H   Continuous: . sodium chloride 75 mL/hr at 04/18/18 0920   [id number] **OR** acetaminophen, morphine injection, ondansetron **OR** ondansetron (ZOFRAN) IV, oxyCODONE, polyethylene glycol, sodium chloride flush     Assessment/Plan:  Intractable nausea and vomiting/biliary ductal dilatation/mildly abnormal LFTs Etiology for her symptoms is not entirely clear.  She was hospitalized in December for similar complaints.  CT scan of the abdomen pelvis does show evidence for CBD enlargement.  This is similar to the findings noted on CT scan done in December.  Gallbladder  was not noted to be abnormal in appearance.  However AST ALT were normal.  Alkaline phosphatase normal.  Bilirubin was normal.  Patient did not improve with symptomatic treatment.  MRCP was done which shows persistent dilatation in the bile ducts.  No masses noted.  Due to persistent symptoms case was discussed with gastroenterology who will evaluate patient.  She may need to undergo at the very least an EGD.  May need EUS as well.  Continue PPI for now.    History of rheumatoid arthritis Not noted to be on any disease modifying agents at home.  Continue to monitor.  HIV nonreactive.    DVT Prophylaxis: SCD    Code Status: Full code Family Communication: Discussed with the patient Disposition Plan: Management as outlined above.  Await GI input.  EGD tentatively being planned for tomorrow.    LOS: 1 day   Tisha Cline [employer] on [email or web address]  04/18/2018, 11:12 AM

## 2018-04-19 ENCOUNTER — Inpatient Hospital Stay (HOSPITAL_COMMUNITY): Payer: Medicare Other | Admitting: Anesthesiology

## 2018-04-19 ENCOUNTER — Encounter (HOSPITAL_COMMUNITY): Payer: Self-pay

## 2018-04-19 ENCOUNTER — Encounter (HOSPITAL_COMMUNITY)
Admission: EM | Disposition: A | Discharge status: Home / Self Care | Payer: Self-pay | Source: Home / Self Care | Attending: Internal Medicine

## 2018-04-19 HISTORY — PX: ESOPHAGOGASTRODUODENOSCOPY (EGD) WITH PROPOFOL: SHX5813

## 2018-04-19 HISTORY — PX: BIOPSY: SHX5522

## 2018-04-19 LAB — CBC
HCT: 39.5 % (ref 36.0–46.0)
Hemoglobin: 13.1 g/dL (ref 12.0–15.0)
MCH: 30.5 pg (ref 26.0–34.0)
MCHC: 33.2 g/dL (ref 30.0–36.0)
MCV: 91.9 fL (ref 80.0–100.0)
Platelets: 203 10*3/uL (ref 150–400)
RBC: 4.3 MIL/uL (ref 3.87–5.11)
RDW: 12.3 % (ref 11.5–15.5)
WBC: 5.5 10*3/uL (ref 4.0–10.5)
nRBC: 0 % (ref 0.0–0.2)

## 2018-04-19 LAB — COMPREHENSIVE METABOLIC PANEL
ALT: 56 U/L — ABNORMAL HIGH (ref 0–44)
AST: 47 U/L — ABNORMAL HIGH (ref 15–41)
Albumin: 3.8 g/dL (ref 3.5–5.0)
Alkaline Phosphatase: 71 U/L (ref 38–126)
Anion gap: 8 (ref 5–15)
BILIRUBIN TOTAL: 2.2 mg/dL — AB (ref 0.3–1.2)
BUN: 15 mg/dL (ref 6–20)
CO2: 23 mmol/L (ref 22–32)
Calcium: 8.6 mg/dL — ABNORMAL LOW (ref 8.9–10.3)
Chloride: 104 mmol/L (ref 98–111)
Creatinine, Ser: 0.69 mg/dL (ref 0.44–1.00)
GFR calc non Af Amer: >60 mL/min (ref >60)
Glucose, Bld: 86 mg/dL (ref 70–99)
Potassium: 3.5 mmol/L (ref 3.5–5.1)
Sodium: 135 mmol/L (ref 135–145)
Total Protein: 7 g/dL (ref 6.5–8.1)

## 2018-04-19 SURGERY — ESOPHAGOGASTRODUODENOSCOPY (EGD) WITH PROPOFOL
Anesthesia: Monitor Anesthesia Care

## 2018-04-19 MED ORDER — PROPOFOL 10 MG/ML IV BOLUS
INTRAVENOUS | Status: AC
  Filled 2018-04-19: qty 40

## 2018-04-19 MED ORDER — PROPOFOL 500 MG/50ML IV EMUL
INTRAVENOUS | Status: DC | PRN
  Administered 2018-04-19: 125 ug/kg/min via INTRAVENOUS

## 2018-04-19 MED ORDER — LACTATED RINGERS IV SOLN
INTRAVENOUS | Status: DC | PRN
  Administered 2018-04-19: 10:00:00 via INTRAVENOUS

## 2018-04-19 MED ORDER — PROPOFOL 10 MG/ML IV BOLUS
INTRAVENOUS | Status: DC | PRN
  Administered 2018-04-19 (×5): 20 mg via INTRAVENOUS

## 2018-04-19 MED ORDER — SODIUM CHLORIDE 0.9 % IV SOLN: INTRAVENOUS | Status: DC

## 2018-04-19 SURGICAL SUPPLY — 15 items

## 2018-04-19 NOTE — Progress Notes (Signed)
Transported to ENDO via bed accompanied by ENDO RN and OR tech.

## 2018-04-19 NOTE — Transfer of Care (Signed)
Immediate Anesthesia Transfer of Care Note  Patient: Kristen Ramos  Procedure(s) Performed: ESOPHAGOGASTRODUODENOSCOPY (EGD) WITH PROPOFOL (N/A ) BIOPSY  Patient Location: PACU  Anesthesia Type:MAC  Level of Consciousness: awake, alert  and oriented  Airway & Oxygen Therapy: Patient Spontanous Breathing and Patient connected to nasal cannula oxygen  Post-op Assessment: Report given to RN and Post -op Vital signs reviewed and stable  Post vital signs: Reviewed and stable  Last Vitals:  Vitals Value Taken Time  BP    Temp    Pulse    Resp    SpO2      Last Pain:  Vitals:   04/19/18 1012  TempSrc:   PainSc: 7       Patients Stated Pain Goal: 3 (17/49/44 9675)  Complications: No apparent anesthesia complications

## 2018-04-19 NOTE — Op Note (Signed)
EGD was performed for nausea and vomiting.  Findings: Normal-appearing esophagus, regular Z line at 38 cm from incisors. Mild antral erythema, biopsies taken for H. Pylori. Normal-appearing cardia and fundus on retroflexion. Normal-appearing duodenum, biopsies taken for celiac disease. Normal-appearing ampulla.   Recommendations: Clear liquid diet today, advance to full liquid diet tomorrow if tolerated. Antiemetics as needed. Possibility of nausea and vomiting due to or worsened with use of narcotics. CBD dilatation without obvious stricture or stone could also be related to chronic narcotic use.  May need outpatient EUS.  Kerin Salen, MD

## 2018-04-19 NOTE — Brief Op Note (Signed)
04/15/2018 - 04/19/2018  10:42 AM  PATIENT:  Cleon Gustin  58 y.o. female  PRE-OPERATIVE DIAGNOSIS:  Nausea and vomiting  POST-OPERATIVE DIAGNOSIS:  gastritis; gastric tissue biopsy r/o h. pylori. duodenal biopsy r/o celiac disease  PROCEDURE:  Procedure(s): ESOPHAGOGASTRODUODENOSCOPY (EGD) WITH PROPOFOL (N/A) BIOPSY  SURGEON:  Surgeon(s) and Role:    Ronnette Juniper, MD - Primary  PHYSICIAN ASSISTANT:   ASSISTANTS: Zenon Mayo, Tech, Charolette Child, Tech  ANESTHESIA:   MAC  EBL:  Minimal  BLOOD ADMINISTERED:none  DRAINS: none   LOCAL MEDICATIONS USED:  NONE  SPECIMEN:  Biopsy / Limited Resection  DISPOSITION OF SPECIMEN:  PATHOLOGY  COUNTS:  YES  TOURNIQUET:  * No tourniquets in log *  DICTATION: .Dragon Dictation  PLAN OF CARE: Admit to inpatient   PATIENT DISPOSITION:  PACU - hemodynamically stable.   Delay start of Pharmacological VTE agent (>24hrs) due to surgical blood loss or risk of bleeding: no

## 2018-04-19 NOTE — Anesthesia Postprocedure Evaluation (Signed)
Anesthesia Post Note  Patient: Kristen Ramos  Procedure(s) Performed: ESOPHAGOGASTRODUODENOSCOPY (EGD) WITH PROPOFOL (N/A ) BIOPSY     Anesthesia Type: MAC    Last Vitals:  Vitals:   04/19/18 1136 04/19/18 1256  BP: (!) 113/56 116/75  Pulse: 64 70  Resp: 18 16  Temp: 36.8 C 36.5 C  SpO2: 100% 100%    Last Pain:  Vitals:   04/19/18 1256  TempSrc: Oral  PainSc:                  [name]

## 2018-04-19 NOTE — Op Note (Signed)
[facility] Patient Name: Kristen Ramos Procedure Date: 04/19/2018 MRN: [id number] Attending MD: Kerin Salen , MD Date of Birth: [birth year] CSN: [id number] Age: 58 Admit Type: Inpatient Procedure:                Upper GI endoscopy Indications:              Nausea with vomiting Providers:                Kerin Salen, MD, Jacquiline Doe, RN, Verita Schneiders,                            Technician, Albertina Senegal. Alday CRNA, CRNA Referring MD:              Medicines:                Monitored Anesthesia Care Complications:            No immediate complications. Estimated blood loss:                            None. Estimated Blood Loss:     Estimated blood loss: none. Procedure:                Pre-Anesthesia Assessment:                           - Prior to the procedure, a History and Physical                            was performed, and patient medications and                            allergies were reviewed. The patient's tolerance of                            previous anesthesia was also reviewed. The risks                            and benefits of the procedure and the sedation                            options and risks were discussed with the patient.                            All questions were answered, and informed consent                            was obtained. Prior Anticoagulants: The patient has                            taken no previous anticoagulant or antiplatelet                            agents. ASA Grade Assessment: II - A patient with  mild systemic disease. After reviewing the risks                            and benefits, the patient was deemed in                            satisfactory condition to undergo the procedure.                           After obtaining informed consent, the endoscope was                            passed under direct vision. Throughout the                            procedure, the patient's blood  pressure, pulse, and                            oxygen saturations were monitored continuously. The                            GIF-H190 ([id number] Olympus gastroscope was                            introduced through the mouth, and advanced to the                            second part of duodenum. The upper GI endoscopy was                            accomplished without difficulty. The patient                            tolerated the procedure well. Scope In: Scope Out: Findings:      The examined esophagus was normal.      The Z-line was regular and was found 38 cm from the incisors.      Localized mildly erythematous mucosa without bleeding was found in the       gastric antrum. Biopsies were taken with a cold forceps for Helicobacter       pylori testing.      The cardia and gastric fundus were normal on retroflexion.      The examined duodenum was normal. Ampulla appeared normal. Biopsies for       histology were taken with a cold forceps for evaluation of celiac       disease. Impression:               - Normal esophagus.                           - Z-line regular, 38 cm from the incisors.                           - Erythematous mucosa in the antrum. Biopsied.                           -  Normal examined duodenum.Normal appearing                            ampulla. Biopsied. Moderate Sedation:      Patient did not receive moderate sedation for this procedure, but       instead received monitored anesthesia care. Recommendation:           - Clear liquid diet.                           - Continue present medications.                           - Await pathology results.                           - Advance to full liquid diet in am if tolerated. Procedure Code(s):        --- Professional ---                           [phone], Esophagogastroduodenoscopy, flexible,                            transoral; with biopsy, single or multiple Diagnosis Code(s):        --- Professional ---                            K31.89, Other diseases of stomach and duodenum                           R11.2, Nausea with vomiting, unspecified CPT copyright 2018 American Medical Association. All rights reserved. The codes documented in this report are preliminary and upon coder review may  be revised to meet current compliance requirements. Kerin Salen, MD 04/19/2018 10:42:18 AM This report has been signed electronically. Number of Addenda: 0

## 2018-04-19 NOTE — Progress Notes (Signed)
[facility] PROGRESS NOTE  Robyne Frasca [id number]:[id number] DOB: 04/10/1960 DOA: 04/15/2018  PCP: Patient, No Pcp Per  Brief History/Interval Summary: 58 y.o. female with medical history significant of rheumatoid arthritis, chronic pain presented with nausea and vomiting.  Per documentation by EMS patient complained of nausea vomiting and some dizziness.  She had a similar episode about 5 months ago.  Patient received numerous doses of antiemetics in the ED before her symptoms were controlled.  Her blood work was unremarkable.   Reason for Visit: Intractable nausea and vomiting  Consultants: Gastroenterology  Procedures:   EGD Impression:                - Normal esophagus. - Z-line regular, 38 cm from the incisors. - Erythematous mucosa in the antrum. Biopsied. - Normal examined duodenum. Normal appearing ampulla.   Antibiotics: None  Subjective/Interval History: Patient had 3 episodes of nausea vomiting yesterday.  Denies any blood in the emesis.  Continues to have upper abdominal discomfort.  Denies any shortness of breath.     ROS: Denies any chest pain  Objective:  Vital Signs  Vitals:   04/19/18 1012 04/19/18 1043 04/19/18 1050 04/19/18 1136  BP:  (!) 115/48 (!) 105/51 (!) 113/56  Pulse:  69 63 64  Resp:  (!) 21 18 18   Temp:  97.8 F (36.6 C)  98.3 F (36.8 C)  TempSrc:  Oral  Oral  SpO2:  100% 100% 100%  Weight: 86.5 kg     Height: 5\' 5"  (1.651 m)       Intake/Output Summary (Last 24 hours) at 04/19/2018 1252 Last data filed at 04/19/2018 1100 Gross per 24 hour  Intake 1863.47 ml  Output 400 ml  Net 1463.47 ml   Filed Weights   04/16/18 0226 04/19/18 1012  Weight: 86.5 kg 86.5 kg   General appearance: Awake alert.  In no distress Resp: Clear to auscultation bilaterally.  Normal effort Cardio: S1-S2 is normal regular.  No S3-S4.  No rubs murmurs or bruit GI: Abdomen is soft.  Mildly tender in the epigastric area without any rebound or guarding.  No  masses organomegaly.  Bowel sounds are present normal.   Extremities: No edema.  Full range of motion of lower extremities. Neurologic: Alert and oriented x3.  No focal neurological deficits.   Lab Results:  Data Reviewed: I have personally reviewed following labs and imaging studies  CBC: Recent Labs  Lab 04/15/18 1719 04/17/18 0451 04/18/18 0457 04/19/18 0426  WBC 6.1 8.6 6.6 5.5  NEUTROABS 4.9  --   --   --   HGB 14.2 14.8 14.1 13.1  HCT 42.6 44.7 42.7 39.5  MCV 89.9 92.0 89.5 91.9  PLT 239 235 236 203    Basic Metabolic Panel: Recent Labs  Lab 04/15/18 1719 04/17/18 0451 04/18/18 0457 04/19/18 0426  NA 140 138 133* 135  K 3.6 3.9 3.7 3.5  CL 109 106 103 104  CO2 21* 23 22 23   GLUCOSE 158* 126* 109* 86  BUN 9 14 13 15   CREATININE 0.70 0.69 0.65 0.69  CALCIUM 9.6 9.1 8.7* 8.6*    GFR: Estimated Creatinine Clearance: 85.3 mL/min (by C-G formula based on SCr of 0.69 mg/dL).  Liver Function Tests: Recent Labs  Lab 04/15/18 1719 04/17/18 0451 04/18/18 0457 04/19/18 0426  AST 21 27 45* 47*  ALT 21 27 51* 56*  ALKPHOS 85 76 72 71  BILITOT 0.5 1.3* 2.0* 2.2*  PROT 8.2* 8.1 7.2 7.0  ALBUMIN 4.4 4.2 3.8 3.8    Recent Labs  Lab 04/15/18 1719  LIPASE 36      Radiology Studies: No results found.   Medications:  Scheduled: . pantoprazole (PROTONIX) IV  40 mg Intravenous Q24H   Continuous: . sodium chloride Stopped (04/19/18 0958)   DUK:GURKYHCWCBJSE **OR** acetaminophen, morphine injection, ondansetron **OR** ondansetron (ZOFRAN) IV, oxyCODONE, polyethylene glycol, sodium chloride flush     Assessment/Plan:  Intractable nausea and vomiting/biliary ductal dilatation/mildly abnormal LFTs Etiology for her symptoms remains unclear.  She was hospitalized in December for similar complaints but did get better after treatment in the hospital.  CT scan of the abdomen pelvis does show evidence for CBD enlargement.  This is similar to the findings noted  on CT scan done in December.  Gallbladder was not noted to be abnormal in appearance.  However AST ALT were normal.  Alkaline phosphatase normal.  Bilirubin was normal.  Patient did not improve with symptomatic treatment.  MRCP was done which shows persistent dilatation in the bile ducts.  No masses noted.  Due to her persistent symptoms gastroenterology was consulted.  Patient underwent EGD this morning which did not reveal any concerning findings.  Outpatient EUS may be necessary.  Opioids thought to be the culprit.  Patient will need to be mobilized.  Continue PPI for now.   LFTs stable.  History of rheumatoid arthritis Not noted to be on any disease modifying agents at home.  Continue to monitor.  HIV nonreactive.  DVT Prophylaxis: SCD    Code Status: Full code Family Communication: Discussed with the patient Disposition Plan: Management as outlined above.  Mobilize.      LOS: 2 days   Bienvenido Proehl Rito Ehrlich  [facility] Pager on [email or web address]  04/19/2018, 12:52 PM

## 2018-04-19 NOTE — Interval H&P Note (Signed)
History and Physical Interval Note: 58/female with nausea, vomiting for an EGD today.  04/19/2018 10:23 AM  Kristen Ramos  has presented today for EGD, with the diagnosis of Nausea and vomiting  The various methods of treatment have been discussed with the patient and family. After consideration of risks, benefits and other options for treatment, the patient has consented to  Procedure(s): ESOPHAGOGASTRODUODENOSCOPY (EGD) WITH PROPOFOL (N/A) as a surgical intervention .  The patient's history has been reviewed, patient examined, no change in status, stable for surgery.  I have reviewed the patient's chart and labs.  Questions were answered to the patient's satisfaction.     Kerin Salen

## 2018-04-19 NOTE — Progress Notes (Signed)
Patient has been NPO since midnight for EGD today.

## 2018-04-20 LAB — COMPREHENSIVE METABOLIC PANEL
ALT: 63 U/L — ABNORMAL HIGH (ref 0–44)
AST: 34 U/L (ref 15–41)
Albumin: 3.4 g/dL — ABNORMAL LOW (ref 3.5–5.0)
Alkaline Phosphatase: 66 U/L (ref 38–126)
Anion gap: 5 (ref 5–15)
BUN: 10 mg/dL (ref 6–20)
CO2: 24 mmol/L (ref 22–32)
Calcium: 8.5 mg/dL — ABNORMAL LOW (ref 8.9–10.3)
Chloride: 107 mmol/L (ref 98–111)
Creatinine, Ser: 0.71 mg/dL (ref 0.44–1.00)
GFR calc Af Amer: >60 mL/min (ref >60)
Glucose, Bld: 88 mg/dL (ref 70–99)
Potassium: 3.7 mmol/L (ref 3.5–5.1)
Sodium: 136 mmol/L (ref 135–145)
Total Bilirubin: 1.2 mg/dL (ref 0.3–1.2)
Total Protein: 6.1 g/dL — ABNORMAL LOW (ref 6.5–8.1)

## 2018-04-20 LAB — CBC
HCT: 36.5 % (ref 36.0–46.0)
Hemoglobin: 11.9 g/dL — ABNORMAL LOW (ref 12.0–15.0)
MCH: 30.3 pg (ref 26.0–34.0)
MCHC: 32.6 g/dL (ref 30.0–36.0)
MCV: 92.9 fL (ref 80.0–100.0)
Platelets: 179 10*3/uL (ref 150–400)
RBC: 3.93 MIL/uL (ref 3.87–5.11)
RDW: 12.2 % (ref 11.5–15.5)
WBC: 5.9 10*3/uL (ref 4.0–10.5)
nRBC: 0 % (ref 0.0–0.2)

## 2018-04-20 NOTE — Progress Notes (Signed)
Pt recently left unit briefly with friend. Stated "We just went to the stairwell for a minute." Pt instructed of hospital policy to not leave the unit. Explained to the pt it was for safety purposes with verbalized understanding. Pt stated "i'll stay right here." Reassured. AC and MD made aware.

## 2018-04-20 NOTE — Progress Notes (Signed)
[facility] PROGRESS NOTE  Quanetta Schlau LGX:[id number] DOB: 03/13/1960 DOA: 04/15/2018  PCP: Patient, No Pcp Per  Brief History/Interval Summary: 58 y.o. female with medical history significant of rheumatoid arthritis, chronic pain presented with nausea and vomiting.  Per documentation by EMS patient complained of nausea vomiting and some dizziness.  She had a similar episode about 5 months ago.  Patient received numerous doses of antiemetics in the ED before her symptoms were controlled.  Her blood work was unremarkable.   Reason for Visit: Intractable nausea and vomiting  Consultants: Gastroenterology  Procedures:   EGD Impression:                - Normal esophagus. - Z-line regular, 38 cm from the incisors. - Erythematous mucosa in the antrum. Biopsied. - Normal examined duodenum. Normal appearing ampulla.   Antibiotics: None  Subjective/Interval History: Patient states that she did not have any episodes of vomiting yesterday.  Wants to have some food to eat this morning.  Denies any abdominal discomfort.     ROS: Denies any headaches  Objective:  Vital Signs  Vitals:   04/19/18 1256 04/19/18 2045 04/19/18 2111 04/20/18 0615  BP: 116/75  107/61 105/66  Pulse: 70 68 66 69  Resp: 16 20 18 18   Temp: 97.7 F (36.5 C)  99.1 F (37.3 C) 98 F (36.7 C)  TempSrc: Oral  Oral Oral  SpO2: 100%  100% 99%  Weight:      Height:        Intake/Output Summary (Last 24 hours) at 04/20/2018 0953 Last data filed at 04/20/2018 0929 Gross per 24 hour  Intake 4354.71 ml  Output 2100 ml  Net 2254.71 ml   Filed Weights   04/16/18 0226 04/19/18 1012  Weight: 86.5 kg 86.5 kg   General appearance: Awake alert.  In no distress Resp: Clear to auscultation bilaterally.  Normal effort Cardio: S1-S2 is normal regular.  No S3-S4.  No rubs murmurs or bruit GI: Abdomen is soft.  Nontender nondistended.  Bowel sounds are present normal.  No masses organomegaly Extremities: No  edema.  Full range of motion of lower extremities. Neurologic: Alert and oriented x3.  No focal neurological deficits.    Lab Results:  Data Reviewed: I have personally reviewed following labs and imaging studies  CBC: Recent Labs  Lab 04/15/18 1719 04/17/18 0451 04/18/18 0457 04/19/18 0426 04/20/18 0503  WBC 6.1 8.6 6.6 5.5 5.9  NEUTROABS 4.9  --   --   --   --   HGB 14.2 14.8 14.1 13.1 11.9*  HCT 42.6 44.7 42.7 39.5 36.5  MCV 89.9 92.0 89.5 91.9 92.9  PLT 239 235 236 203 179    Basic Metabolic Panel: Recent Labs  Lab 04/15/18 1719 04/17/18 0451 04/18/18 0457 04/19/18 0426 04/20/18 0503  NA 140 138 133* 135 136  K 3.6 3.9 3.7 3.5 3.7  CL 109 106 103 104 107  CO2 21* 23 22 23 24   GLUCOSE 158* 126* 109* 86 88  BUN 9 14 13 15 10   CREATININE 0.70 0.69 0.65 0.69 0.71  CALCIUM 9.6 9.1 8.7* 8.6* 8.5*    GFR: Estimated Creatinine Clearance: 85.3 mL/min (by C-G formula based on SCr of 0.71 mg/dL).  Liver Function Tests: Recent Labs  Lab 04/15/18 1719 04/17/18 0451 04/18/18 0457 04/19/18 0426 04/20/18 0503  AST 21 27 45* 47* 34  ALT 21 27 51* 56* 63*  ALKPHOS 85 76 72 71 66  BILITOT 0.5 1.3* 2.0*  2.2* 1.2  PROT 8.2* 8.1 7.2 7.0 6.1*  ALBUMIN 4.4 4.2 3.8 3.8 3.4*    Recent Labs  Lab 04/15/18 1719  LIPASE 36      Radiology Studies: No results found.   Medications:  Scheduled: . pantoprazole (PROTONIX) IV  40 mg Intravenous Q24H   Continuous: . sodium chloride 75 mL/hr at 04/19/18 1808   NGE:[id number]:acetaminophen **OR** acetaminophen, morphine injection, ondansetron **OR** ondansetron (ZOFRAN) IV, oxyCODONE, polyethylene glycol, sodium chloride flush     Assessment/Plan:  Intractable nausea and vomiting/biliary ductal dilatation/mildly abnormal LFTs Etiology for her symptoms remains unclear.  She was hospitalized in December for similar complaints but did get better after treatment in the hospital.  CT scan of the abdomen pelvis does show evidence  for CBD enlargement.  This is similar to the findings noted on CT scan done in December.  Gallbladder was not noted to be abnormal in appearance.  However AST ALT were normal.  Alkaline phosphatase normal.  Bilirubin was normal.  Patient did not improve with symptomatic treatment.  MRCP was done which shows persistent dilatation in the bile ducts.  No masses noted.  Due to her persistent symptoms gastroenterology was consulted.  Patient underwent EGD which did not reveal any concerning findings.  Outpatient EUS may be necessary.  Opioids thought to be the culprit.  Patient starting to feel better.  We will advance her to soft diet today.  Continue PPI.  LFTs stable to improved.  Mobilize.    History of rheumatoid arthritis Not noted to be on any disease modifying agents at home.  Continue to monitor.  HIV nonreactive.  DVT Prophylaxis: SCD    Code Status: Full code Family Communication: Discussed with the patient Disposition Plan: Management as outlined above.  Mobilize.  Advance diet today.  If she continues to do well she could go home tomorrow.    LOS: 3 days   Nadina Fomby Foot LockerKrishnan  [facility] Pager on [email or web address]  04/20/2018, 9:53 AM

## 2018-04-20 NOTE — Progress Notes (Signed)
Pt found outside by security, and brought back to her room. Pt states she felt claustrophobic and needed to get some air. I removed the recliner from the room to make more open space in her room. AC notified and came to talk with pt. On-call MD notified. Pt verbalized understanding that she must stay on the unit for her safety. Will continue to monitor.  Pt was walking in the hall at the beginning of my shift and I reminded her to stay on the unit, after receiving report that she had left the unit twice already. Pt verbalized understanding and said she would not leave the unit at that time, too.

## 2018-04-20 NOTE — Plan of Care (Signed)
Plan of care reviewed and discussed with the patient. 

## 2018-04-21 ENCOUNTER — Encounter (HOSPITAL_COMMUNITY): Payer: Self-pay | Admitting: Gastroenterology

## 2018-04-21 LAB — CBC
HCT: 35.9 % — ABNORMAL LOW (ref 36.0–46.0)
Hemoglobin: 12.2 g/dL (ref 12.0–15.0)
MCH: 30.2 pg (ref 26.0–34.0)
MCHC: 34 g/dL (ref 30.0–36.0)
MCV: 88.9 fL (ref 80.0–100.0)
Platelets: 174 10*3/uL (ref 150–400)
RBC: 4.04 MIL/uL (ref 3.87–5.11)
RDW: 12.1 % (ref 11.5–15.5)
WBC: 5.9 10*3/uL (ref 4.0–10.5)
nRBC: 0 % (ref 0.0–0.2)

## 2018-04-21 LAB — COMPREHENSIVE METABOLIC PANEL
ALBUMIN: 3.3 g/dL — AB (ref 3.5–5.0)
ALT: 46 U/L — ABNORMAL HIGH (ref 0–44)
AST: 19 U/L (ref 15–41)
Alkaline Phosphatase: 65 U/L (ref 38–126)
Anion gap: 7 (ref 5–15)
BILIRUBIN TOTAL: 0.6 mg/dL (ref 0.3–1.2)
BUN: 6 mg/dL (ref 6–20)
CO2: 24 mmol/L (ref 22–32)
Calcium: 8.4 mg/dL — ABNORMAL LOW (ref 8.9–10.3)
Chloride: 107 mmol/L (ref 98–111)
Creatinine, Ser: 0.68 mg/dL (ref 0.44–1.00)
GFR calc Af Amer: >60 mL/min (ref >60)
GFR calc non Af Amer: >60 mL/min (ref >60)
Glucose, Bld: 91 mg/dL (ref 70–99)
Potassium: 3.7 mmol/L (ref 3.5–5.1)
Sodium: 138 mmol/L (ref 135–145)
TOTAL PROTEIN: 6.4 g/dL — AB (ref 6.5–8.1)

## 2018-04-21 MED ORDER — PANTOPRAZOLE SODIUM 40 MG PO TBEC: 40.0000 mg | DELAYED_RELEASE_TABLET | Freq: Every day | ORAL | 0 refills | Status: DC

## 2018-04-21 MED ORDER — POLYETHYLENE GLYCOL 3350 17 G PO PACK: 17.0000 g | PACK | Freq: Every day | ORAL | 0 refills | Status: DC | PRN

## 2018-04-21 MED ORDER — ONDANSETRON 4 MG PO TBDP: 4.0000 mg | ORAL_TABLET | Freq: Three times a day (TID) | ORAL | 0 refills | Status: DC | PRN

## 2018-04-21 NOTE — Discharge Instructions (Signed)
Nausea and Vomiting, Adult  Nausea is feeling sick to your stomach or feeling that you are about to throw up (vomit). Vomiting is when food in your stomach is thrown up and out of the mouth. Throwing up can make you feel weak. It can also make you lose too much water in your body (get dehydrated). If you lose too much water in your body, you may:  · Feel tired.  · Feel thirsty.  · Have a dry mouth.  · Have cracked lips.  · Go pee (urinate) less often.  Older adults and people with other diseases or a weak body defense system (immune system) are at higher risk for losing too much water in the body. If you feel sick to your stomach and you throw up, it is important to follow instructions from your doctor about how to take care of yourself.  Follow these instructions at home:  Watch your symptoms for any changes. Tell your doctor about them. Follow these instructions to care for yourself at home.  Eating and drinking         · Take an ORS (oral rehydration solution). This is a drink that is sold at pharmacies and stores.  · Drink clear fluids in small amounts as you are able, such as:  ? Water.  ? Ice chips.  ? Fruit juice that has water added (diluted fruit juice).  ? Low-calorie sports drinks.  · Eat bland, easy-to-digest foods in small amounts as you are able, such as:  ? Bananas.  ? Applesauce.  ? Rice.  ? Low-fat (lean) meats.  ? Toast.  ? Crackers.  · Avoid drinking fluids that have a lot of sugar or caffeine in them. This includes energy drinks, sports drinks, and soda.  · Avoid alcohol.  · Avoid spicy or fatty foods.  General instructions  · Take over-the-counter and prescription medicines only as told by your doctor.  · Drink enough fluid to keep your pee (urine) pale yellow.  · Wash your hands often with soap and water. If you cannot use soap and water, use hand sanitizer.  · Make sure that all people in your home wash their hands well and often.  · Rest at home while you get better.  · Watch your condition  for any changes.  · Take slow and deep breaths when you feel sick to your stomach.  · Keep all follow-up visits as told by your doctor. This is important.  Contact a doctor if:  · Your symptoms get worse.  · You have new symptoms.  · You have a fever.  · You cannot drink fluids without throwing up.  · You feel sick to your stomach for more than 2 days.  · You feel light-headed or dizzy.  · You have a headache.  · You have muscle cramps.  · You have a rash.  · You have pain while peeing.  Get help right away if:  · You have pain in your chest, neck, arm, or jaw.  · You feel very weak or you pass out (faint).  · You throw up again and again.  · You have throw up that is bright red or looks like black coffee grounds.  · You have bloody or black poop (stools) or poop that looks like tar.  · You have a very bad headache, a stiff neck, or both.  · You have very bad pain, cramping, or bloating in your belly (abdomen).  · You have trouble   breathing.  · You are breathing very quickly.  · Your heart is beating very quickly.  · Your skin feels cold and clammy.  · You feel confused.  · You have signs of losing too much water in your body, such as:  ? Dark pee, very little pee, or no pee.  ? Cracked lips.  ? Dry mouth.  ? Sunken eyes.  ? Sleepiness.  ? Weakness.  These symptoms may be an emergency. Do not wait to see if the symptoms will go away. Get medical help right away. Call your local emergency services (911 in the [place] Do not drive yourself to the hospital.  Summary  · Nausea is feeling sick to your stomach or feeling that you are about to throw up (vomit). Vomiting is when food in your stomach is thrown up and out of the mouth.  · Follow instructions from your doctor about eating and drinking to keep from losing too much water in your body.  · Take over-the-counter and prescription medicines only as told by your doctor.  · Contact your doctor if your symptoms get worse or you have new symptoms.  · Keep all follow-up  visits as told by your doctor. This is important.  This information is not intended to replace advice given to you by your health care provider. Make sure you discuss any questions you have with your health care provider.  Document Released: 08/15/2007 Document Revised: 08/06/2017 Document Reviewed: 08/06/2017  Elsevier Interactive Patient Education © 2019 [employer]

## 2018-04-21 NOTE — Care Management Important Message (Signed)
Important Message  Patient Details  Name: Kristen Ramos MRN: [id number] Date of Birth: [birth year]   Medicare Important Message Given:  Yes    [name], [name] 04/21/2018, 10:40 AMImportant Message  Patient Details  Name: Kristen Ramos MRN: [id number] Date of Birth: [birth year]   Medicare Important Message Given:  Yes    [name], [name] 04/21/2018, 10:40 AM

## 2018-04-21 NOTE — Discharge Summary (Signed)
[facility]  Physician Discharge Summary   Patient ID: Kristen NewnessCharlene Ramos MRN: [id number] DOB/AGE: [birth year] 58 y.o.  Admit date: 04/15/2018 Discharge date: 04/21/2018  PCP: patiently recently changed to a new primary care provider.  DISCHARGE DIAGNOSES:  Intractable nausea and vomiting, resolved Biliary ductal dilatation, unclear etiology History of rheumatoid arthritis  RECOMMENDATIONS FOR OUTPATIENT FOLLOW UP: 1. Patient to follow-up with gastroenterology in a few weeks for further evaluation of biliary ductal dilatation.  Home Health: None Equipment/Devices: None  CODE STATUS: Full code  DISCHARGE CONDITION: fair  Diet recommendation: As before  INITIAL HISTORY: 58 y.o.femalewith medical history significant ofrheumatoid arthritis, chronic pain presented with nausea and vomiting. Per documentation by EMS patient complained of nausea vomiting and some dizziness. She had a similar episode about 5 months ago. Patient received numerous doses of antiemetics in the ED before her symptoms were controlled. Her blood work was unremarkable.   [facility]  Procedures:   EGD Impression:  - Normal esophagus. - Z-line regular, 38 cm from the incisors. - Erythematous mucosa in the antrum. Biopsied. - Normal examined duodenum. Normal appearing ampulla.    HOSPITAL COURSE:   Intractable nausea and vomiting/biliary ductal dilatation/mildly abnormal LFTs Patient was admitted to the hospital.  Given symptomatic treatment.  Reason for her symptoms was not entirely clear.  She was subsequently seen by gastroenterology and underwent EGD which was unremarkable.  It was felt that perhaps opioids could be contributing.  CT scan had shown biliary ductal dilatation.  She underwent MRCP which showed the same findings without any clear etiology.  In the meantime patient symptoms improved.  She is tolerating solid foods.  She will be discharged  on PPI.  Biliary ductal dilatation CT scan of the abdomen pelvis does show evidence for CBD enlargement.  This is similar to the findings noted on CT scan done in December.  Gallbladder was not noted to be abnormal in appearance.  Patient did not improve with symptomatic treatment.  MRCP was done which shows persistent dilatation in the bile ducts.  No masses noted.  She did have a mild bump in her AST ALT and bilirubin which are now normal.   Gastroenterology feels that an outpatient EUS may be necessary to further evaluate the biliary abnormalities.  Outpatient follow-up recommended.    History of rheumatoid arthritis Not noted to be on any disease modifying agents at home.  HIV nonreactive.  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  Overall stable.  Patient feels better.  She has been ambulating without any difficulties.  Okay for discharge home today.     PERTINENT LABS:  The results of significant diagnostics from this hospitalization (including imaging, microbiology, ancillary and laboratory) are listed below for reference.     Labs: Basic Metabolic Panel: Recent Labs  Lab 04/17/18 0451 04/18/18 0457 04/19/18 0426 04/20/18 0503 04/21/18 0439  NA 138 133* 135 136 138  K 3.9 3.7 3.5 3.7 3.7  CL 106 103 104 107 107  CO2 23 22 23 24 24   GLUCOSE 126* 109* 86 88 91  BUN 14 13 15 10 6   CREATININE 0.69 0.65 0.69 0.71 0.68  CALCIUM 9.1 8.7* 8.6* 8.5* 8.4*   Liver Function Tests: Recent Labs  Lab 04/17/18 0451 04/18/18 0457 04/19/18 0426 04/20/18 0503 04/21/18 0439  AST 27 45* 47* 34 19  ALT 27 51* 56* 63* 46*  ALKPHOS 76 72 71 66 65  BILITOT 1.3* 2.0* 2.2* 1.2 0.6  PROT 8.1 7.2 7.0 6.1* 6.4*  ALBUMIN 4.2 3.8 3.8 3.4* 3.3*   Recent Labs  Lab 04/15/18 1719  LIPASE 36   CBC: Recent Labs  Lab 04/15/18 1719 04/17/18 0451 04/18/18 0457 04/19/18 0426 04/20/18 0503 04/21/18 0439  WBC 6.1 8.6 6.6 5.5 5.9 5.9  NEUTROABS 4.9  --   --   --   --   --   HGB 14.2 14.8  14.1 13.1 11.9* 12.2  HCT 42.6 44.7 42.7 39.5 36.5 35.9*  MCV 89.9 92.0 89.5 91.9 92.9 88.9  PLT 239 235 236 203 179 174    IMAGING STUDIES Ct Abdomen Pelvis W Contrast  Result Date: 04/15/2018 CLINICAL DATA:  58 year old female with bilious vomiting. Complaint of vertigo. EXAM: CT ABDOMEN AND PELVIS WITH CONTRAST TECHNIQUE: Multidetector CT imaging of the abdomen and pelvis was performed using the standard protocol following bolus administration of intravenous contrast. CONTRAST:  ISOVUE-300 IOPAMIDOL (ISOVUE-300) INJECTION 61% COMPARISON:  CT Abdomen and Pelvis 02/12/2018. FINDINGS: Lower chest: Cardiac size at the upper limits of normal. Negative lung bases. No pericardial or pleural effusion. Hepatobiliary: Negative gallbladder, Phrygian cap (normal variant). Liver enhancement is within normal limits. There is prominence of the CBD measuring about 12 millimeters diameter (coronal image 35) which is unchanged from December. There is associated mild left intrahepatic biliary ductal dilatation (series 2, image 16). No obstructing etiology is identified. Pancreas: The pancreas appears normal, with no main pancreatic ductal dilatation. Spleen: Negative. Adrenals/Urinary Tract: Stable thickened left adrenal gland with relatively preserved adreniform shape. The right adrenal is normal. Bilateral renal enhancement and contrast excretion is symmetric and normal. Normal proximal ureters. Distended (685 milliliters) but otherwise unremarkable urinary bladder. Similar bladder distension in December. Stomach/Bowel: Negative rectum with mild retained stool. Negative sigmoid colon aside from redundancy. Decompressed descending and transverse colon. Largely decompressed right colon with moderate diverticulosis, but no active inflammation. Normal appendix (coronal image 33). Negative terminal ileum. No dilated or abnormal small bowel. There might be a small gastric hiatal hernia, but the stomach is fairly  decompressed and otherwise normal. Negative duodenum. No mesenteric inflammation. No free air, free fluid. Vascular/Lymphatic: Aortoiliac calcified atherosclerosis. Major arterial structures in the abdomen and pelvis are patent. Portal venous system is patent. No lymphadenopathy. Reproductive: Negative. Other: No pelvic free fluid. Musculoskeletal: Chronic lower lumbar decompression and fusion. Hardware appears stable. No acute osseous abnormality identified. IMPRESSION: 1. Persistent CBD enlargement, and left intrahepatic biliary ductal enlargement to a lesser extent. But this is unchanged since December and no obstructing etiology is identified. Query hyperbilirubinemia. ERCP or MRCP may be valuable. 2. No acute or inflammatory process identified in the abdomen or pelvis. 3. Distended (685 mL) but otherwise normal urinary bladder. 4. Aortic Atherosclerosis (ICD10-I70.0). Electronically Signed   By: Odessa Fleming M.D.   On: 04/15/2018 22:14   Mr Abdomen Mrcp Wo Contrast  Result Date: 04/17/2018 CLINICAL DATA:  Dilated common bile duct. EXAM: MRI ABDOMEN WITHOUT CONTRAST  (INCLUDING MRCP) TECHNIQUE: Multiplanar multisequence MR imaging of the abdomen was performed. Heavily T2-weighted images of the biliary and pancreatic ducts were obtained, and three-dimensional MRCP images were rendered by post processing. Patient reported to be uncooperative. Patient refused contrast material. COMPARISON:  04/15/2018 FINDINGS: Lower chest: No acute findings. Hepatobiliary: Diminished exam detail due to lack of IV contrast material. There is motion artifact identified on multiple sequences which diminishes exam detail. No focal liver abnormality the gallbladder appears unremarkable. Within the limitations described above there is no gallstone, gallbladder wall thickening or pericholecystic fluid identified. Mild intrahepatic bile  duct dilatation. There is fusiform dilatation of the common bile duct which measures 1.6 cm in maximum  diameter. On CT from 02/12/2018 this measured 1.3 cm. Beak like narrowing at the distal CBD is identified, image 23/6. No choledocholithiasis identified. Pancreas: Within the limitations of unenhanced technique no focal mass lesion identified within the head of pancreas. No main duct dilatation or inflammation identified. Spleen:  Within normal limits in size and appearance. Adrenals/Urinary Tract: Normal right adrenal gland. Nodule in the left adrenal gland is again identified there is loss of signal within this nodule on the out of phase sequences compatible with benign adenoma. No hydronephrosis identified bilaterally. Stomach/Bowel: Visualized portions within the abdomen are unremarkable. Vascular/Lymphatic: Aortic atherosclerosis. No aneurysm. No abdominal adenopathy. Other:  No ascites or fluid collections identified. Musculoskeletal: No suspicious bone lesions identified. IMPRESSION: 1. Again seen is persistent common bile duct dilatation and mild intrahepatic duct dilatation. No obstructing stone or discrete mass identified. Note: Exam detail diminished by motion artifact and lack of IV contrast material. 2.  Aortic Atherosclerosis (ICD10-I70.0). Electronically Signed   By: Signa Kellaylor  Stroud M.D.   On: 04/17/2018 17:38   [place] Ekg Site Rite  Result Date: 04/16/2018 If Site Rite image not attached, placement could not be confirmed due to current cardiac rhythm.   DISCHARGE EXAMINATION: Vitals:   04/20/18 0615 04/20/18 1359 04/20/18 2108 04/21/18 0657  BP: 105/66 97/62 (!) 101/56 117/71  Pulse: 69 65 64 68  Resp: 18 18 18 18   Temp: 98 F (36.7 C) 98.1 F (36.7 C) 98.2 F (36.8 C) 98.6 F (37 C)  TempSrc: Oral Oral Oral Oral  SpO2: 99%  100% 95%  Weight:      Height:       General appearance: alert, cooperative, appears stated age and no distress Resp: clear to auscultation bilaterally Cardio: regular rate and rhythm, S1, S2 normal, no murmur, click, rub or gallop GI: soft, non-tender; bowel  sounds normal; no masses,  no organomegaly  DISPOSITION: Home  Discharge Instructions    Call MD for:  difficulty breathing, headache or visual disturbances   Complete by:  As directed    Call MD for:  extreme fatigue   Complete by:  As directed    Call MD for:  persistant dizziness or light-headedness   Complete by:  As directed    Call MD for:  persistant nausea and vomiting   Complete by:  As directed    Call MD for:  severe uncontrolled pain   Complete by:  As directed    Call MD for:  temperature >100.4   Complete by:  As directed    Discharge instructions   Complete by:  As directed    Please follow-up with your primary care provider within 7 to 10 days.  Please also call the gastroenterologist office for follow-up and for further evaluation of the abnormal bile ducts.  Eat a soft bland diet for the next 3 to 4 days.  You were cared for by a hospitalist during your hospital stay. If you have any questions about your discharge medications or the care you received while you were in the hospital after you are discharged, you can call the unit and asked to speak with the hospitalist on call if the hospitalist that took care of you is not available. Once you are discharged, your primary care physician will handle any further medical issues. Please note that NO REFILLS for any discharge medications will be authorized once you are discharged,  as it is imperative that you return to your primary care physician (or establish a relationship with a primary care physician if you do not have one) for your aftercare needs so that they can reassess your need for medications and monitor your lab values. If you do not have a primary care physician, you can call [phone] for a physician referral.   Increase activity slowly   Complete by:  As directed         Allergies as of 04/21/2018      Reactions   Bee Venom Swelling      Medication List    TAKE these medications   ondansetron 4 MG  disintegrating tablet Commonly known as:  ZOFRAN-ODT Take 1 tablet (4 mg total) by mouth every 8 (eight) hours as needed for nausea or vomiting. What changed:    how much to take  how to take this  when to take this  reasons to take this  additional instructions   oxyCODONE 15 MG immediate release tablet Commonly known as:  ROXICODONE Take 15 mg by mouth 4 (four) times daily as needed for pain.   pantoprazole 40 MG tablet Commonly known as:  PROTONIX Take 1 tablet (40 mg total) by mouth daily.   polyethylene glycol packet Commonly known as:  MIRALAX / GLYCOLAX Take 17 g by mouth daily as needed for mild constipation.   tiZANidine 4 MG capsule Commonly known as:  ZANAFLEX Take 4 mg by mouth 2 (two) times daily as needed for muscle spasms.        Follow-up Information    Charlott Rakes, MD. Schedule an appointment as soon as possible for a visit in 2 week(s).   Specialty:  Gastroenterology Why:  for abnormal bile ducts Contact information: [address] [place] [address] [phone]           TOTAL DISCHARGE TIME: 35 minutes  Leiani Enright Rito Ehrlich  [facility] Pager on [email or web address]  04/21/2018, 9:56 AM

## 2018-04-21 NOTE — Progress Notes (Signed)
Discharge instructions given to pt and all questions were answered.  

## 2018-07-15 ENCOUNTER — Emergency Department (HOSPITAL_COMMUNITY): Payer: Medicare Other

## 2018-07-15 ENCOUNTER — Encounter (HOSPITAL_COMMUNITY): Payer: Self-pay | Admitting: Student

## 2018-07-15 ENCOUNTER — Other Ambulatory Visit: Payer: Self-pay

## 2018-07-15 ENCOUNTER — Inpatient Hospital Stay (HOSPITAL_COMMUNITY)
Admission: EM | Admit: 2018-07-15 | Discharge: 2018-07-21 | DRG: 394 | Disposition: A | Discharge status: Home / Self Care | Payer: Medicare Other | Attending: Internal Medicine | Admitting: Internal Medicine

## 2018-07-15 DIAGNOSIS — E872 Acidosis: Secondary | ICD-10-CM | POA: Diagnosis present

## 2018-07-15 DIAGNOSIS — K2950 Unspecified chronic gastritis without bleeding: Secondary | ICD-10-CM | POA: Diagnosis present

## 2018-07-15 DIAGNOSIS — Z90710 Acquired absence of both cervix and uterus: Secondary | ICD-10-CM

## 2018-07-15 DIAGNOSIS — Z1159 Encounter for screening for other viral diseases: Secondary | ICD-10-CM

## 2018-07-15 DIAGNOSIS — M069 Rheumatoid arthritis, unspecified: Secondary | ICD-10-CM | POA: Diagnosis present

## 2018-07-15 DIAGNOSIS — R197 Diarrhea, unspecified: Secondary | ICD-10-CM | POA: Diagnosis present

## 2018-07-15 DIAGNOSIS — R112 Nausea with vomiting, unspecified: Secondary | ICD-10-CM | POA: Diagnosis present

## 2018-07-15 DIAGNOSIS — I10 Essential (primary) hypertension: Secondary | ICD-10-CM | POA: Diagnosis present

## 2018-07-15 DIAGNOSIS — R040 Epistaxis: Secondary | ICD-10-CM | POA: Diagnosis not present

## 2018-07-15 DIAGNOSIS — E876 Hypokalemia: Secondary | ICD-10-CM | POA: Diagnosis not present

## 2018-07-15 DIAGNOSIS — G8929 Other chronic pain: Secondary | ICD-10-CM | POA: Diagnosis present

## 2018-07-15 DIAGNOSIS — Z79891 Long term (current) use of opiate analgesic: Secondary | ICD-10-CM

## 2018-07-15 DIAGNOSIS — F17210 Nicotine dependence, cigarettes, uncomplicated: Secondary | ICD-10-CM | POA: Diagnosis present

## 2018-07-15 DIAGNOSIS — Z79899 Other long term (current) drug therapy: Secondary | ICD-10-CM

## 2018-07-15 DIAGNOSIS — Z91030 Bee allergy status: Secondary | ICD-10-CM

## 2018-07-15 DIAGNOSIS — K219 Gastro-esophageal reflux disease without esophagitis: Secondary | ICD-10-CM | POA: Diagnosis present

## 2018-07-15 DIAGNOSIS — E785 Hyperlipidemia, unspecified: Secondary | ICD-10-CM | POA: Diagnosis present

## 2018-07-15 DIAGNOSIS — R1115 Cyclical vomiting syndrome unrelated to migraine: Principal | ICD-10-CM | POA: Diagnosis present

## 2018-07-15 DIAGNOSIS — R9431 Abnormal electrocardiogram [ECG] [EKG]: Secondary | ICD-10-CM | POA: Diagnosis present

## 2018-07-15 DIAGNOSIS — M545 Low back pain: Secondary | ICD-10-CM | POA: Diagnosis present

## 2018-07-15 DIAGNOSIS — I700 Atherosclerosis of aorta: Secondary | ICD-10-CM | POA: Diagnosis present

## 2018-07-15 LAB — COMPREHENSIVE METABOLIC PANEL
ALT: 15 U/L (ref 0–44)
AST: 20 U/L (ref 15–41)
Albumin: 4.1 g/dL (ref 3.5–5.0)
Alkaline Phosphatase: 90 U/L (ref 38–126)
Anion gap: 14 (ref 5–15)
BUN: 8 mg/dL (ref 6–20)
CO2: 17 mmol/L — ABNORMAL LOW (ref 22–32)
Calcium: 9.6 mg/dL (ref 8.9–10.3)
Chloride: 105 mmol/L (ref 98–111)
Creatinine, Ser: 0.91 mg/dL (ref 0.44–1.00)
GFR calc Af Amer: >60 mL/min (ref >60)
GFR calc non Af Amer: >60 mL/min (ref >60)
Glucose, Bld: 128 mg/dL — ABNORMAL HIGH (ref 70–99)
Potassium: 3.7 mmol/L (ref 3.5–5.1)
Sodium: 136 mmol/L (ref 135–145)
Total Bilirubin: 0.5 mg/dL (ref 0.3–1.2)
Total Protein: 7.7 g/dL (ref 6.5–8.1)

## 2018-07-15 LAB — CBC WITH DIFFERENTIAL/PLATELET
Abs Immature Granulocytes: 0.01 10*3/uL (ref 0.00–0.07)
Basophils Absolute: 0.1 10*3/uL (ref 0.0–0.1)
Basophils Relative: 1 %
Eosinophils Absolute: 0.3 10*3/uL (ref 0.0–0.5)
Eosinophils Relative: 4 %
HCT: 41.4 % (ref 36.0–46.0)
Hemoglobin: 14.4 g/dL (ref 12.0–15.0)
Immature Granulocytes: 0 %
Lymphocytes Relative: 19 %
Lymphs Abs: 1.2 10*3/uL (ref 0.7–4.0)
MCH: 30.1 pg (ref 26.0–34.0)
MCHC: 34.8 g/dL (ref 30.0–36.0)
MCV: 86.6 fL (ref 80.0–100.0)
Monocytes Absolute: 0.2 10*3/uL (ref 0.1–1.0)
Monocytes Relative: 4 %
Neutro Abs: 4.8 10*3/uL (ref 1.7–7.7)
Neutrophils Relative %: 72 %
Platelets: 325 10*3/uL (ref 150–400)
RBC: 4.78 MIL/uL (ref 3.87–5.11)
RDW: 12.7 % (ref 11.5–15.5)
WBC: 6.6 10*3/uL (ref 4.0–10.5)
nRBC: 0 % (ref 0.0–0.2)

## 2018-07-15 LAB — URINALYSIS, ROUTINE W REFLEX MICROSCOPIC
Bilirubin Urine: NEGATIVE
Glucose, UA: NEGATIVE mg/dL
Hgb urine dipstick: NEGATIVE
Ketones, ur: NEGATIVE mg/dL
Leukocytes,Ua: NEGATIVE
Nitrite: NEGATIVE
Protein, ur: NEGATIVE mg/dL
Specific Gravity, Urine: 1.016 (ref 1.005–1.030)
pH: 8 (ref 5.0–8.0)

## 2018-07-15 LAB — SARS CORONAVIRUS 2 BY RT PCR (HOSPITAL ORDER, PERFORMED IN ~~LOC~~ HOSPITAL LAB): SARS Coronavirus 2: NEGATIVE

## 2018-07-15 LAB — POC URINE PREG, ED: Preg Test, Ur: NEGATIVE

## 2018-07-15 LAB — LIPASE, BLOOD: Lipase: 31 U/L (ref 11–51)

## 2018-07-15 MED ORDER — MORPHINE SULFATE (PF) 4 MG/ML IV SOLN
4.0000 mg | Freq: Once | INTRAVENOUS | Status: AC
  Administered 2018-07-15: 16:00:00 4 mg via INTRAVENOUS
  Filled 2018-07-15: qty 1

## 2018-07-15 MED ORDER — PROMETHAZINE HCL 25 MG PO TABS
12.5000 mg | ORAL_TABLET | Freq: Four times a day (QID) | ORAL | Status: DC | PRN
  Filled 2018-07-15: qty 1

## 2018-07-15 MED ORDER — ENOXAPARIN SODIUM 40 MG/0.4ML ~~LOC~~ SOLN
40.0000 mg | SUBCUTANEOUS | Status: DC
  Administered 2018-07-16 – 2018-07-20 (×6): 40 mg via SUBCUTANEOUS
  Filled 2018-07-15 (×6): qty 0.4

## 2018-07-15 MED ORDER — ACETAMINOPHEN 650 MG RE SUPP: 650.0000 mg | Freq: Four times a day (QID) | RECTAL | Status: DC | PRN

## 2018-07-15 MED ORDER — METOCLOPRAMIDE HCL 5 MG/ML IJ SOLN
10.0000 mg | Freq: Once | INTRAMUSCULAR | Status: AC
  Administered 2018-07-15: 10 mg via INTRAVENOUS
  Filled 2018-07-15: qty 2

## 2018-07-15 MED ORDER — LACTATED RINGERS IV SOLN
INTRAVENOUS | Status: DC
  Administered 2018-07-16: 01:00:00 via INTRAVENOUS

## 2018-07-15 MED ORDER — HALOPERIDOL LACTATE 5 MG/ML IJ SOLN
5.0000 mg | Freq: Once | INTRAMUSCULAR | Status: AC
  Administered 2018-07-15: 5 mg via INTRAVENOUS
  Filled 2018-07-15: qty 1

## 2018-07-15 MED ORDER — SODIUM CHLORIDE 0.9 % IV BOLUS
1000.0000 mL | Freq: Once | INTRAVENOUS | Status: AC
  Administered 2018-07-15: 1000 mL via INTRAVENOUS

## 2018-07-15 MED ORDER — DICYCLOMINE HCL 10 MG/ML IM SOLN
20.0000 mg | Freq: Once | INTRAMUSCULAR | Status: AC
  Administered 2018-07-15: 20 mg via INTRAMUSCULAR
  Filled 2018-07-15: qty 2

## 2018-07-15 MED ORDER — FENTANYL CITRATE (PF) 100 MCG/2ML IJ SOLN
25.0000 ug | Freq: Once | INTRAMUSCULAR | Status: AC
  Administered 2018-07-15: 20:00:00 25 ug via INTRAVENOUS
  Filled 2018-07-15: qty 2

## 2018-07-15 MED ORDER — DIPHENHYDRAMINE HCL 50 MG/ML IJ SOLN
12.5000 mg | Freq: Once | INTRAMUSCULAR | Status: AC
  Administered 2018-07-15: 16:00:00 12.5 mg via INTRAVENOUS
  Filled 2018-07-15: qty 1

## 2018-07-15 MED ORDER — SENNOSIDES-DOCUSATE SODIUM 8.6-50 MG PO TABS: 1.0000 | ORAL_TABLET | Freq: Every evening | ORAL | Status: DC | PRN

## 2018-07-15 MED ORDER — FAMOTIDINE IN NACL 20-0.9 MG/50ML-% IV SOLN
20.0000 mg | Freq: Once | INTRAVENOUS | Status: AC
  Administered 2018-07-15: 20 mg via INTRAVENOUS
  Filled 2018-07-15: qty 50

## 2018-07-15 MED ORDER — PANTOPRAZOLE SODIUM 40 MG PO TBEC
40.0000 mg | DELAYED_RELEASE_TABLET | Freq: Every day | ORAL | Status: DC
  Administered 2018-07-16: 40 mg via ORAL
  Filled 2018-07-15: qty 1

## 2018-07-15 MED ORDER — ACETAMINOPHEN 325 MG PO TABS
650.0000 mg | ORAL_TABLET | Freq: Four times a day (QID) | ORAL | Status: DC | PRN
  Administered 2018-07-19 – 2018-07-21 (×4): 650 mg via ORAL
  Filled 2018-07-15 (×4): qty 2

## 2018-07-15 NOTE — ED Provider Notes (Signed)
1900: Pt under initial care of ED PA Petrucelli now in progressive bed pending labs and control of n/v/abdominal pain. See note for full details.  Briefly, pt here for n/v/epigastric abdominal pain not tolerating PO with home zofran/oxycodone.  Recent admission Feb 2020 for similar with reassuring work up including CTAP, EGD, MRCP showing chronic unchanged CBD dilation 12 mm.    1945: re-evaluated patient. She was asleep. I discussed labs and KUB, recommended PO challenge with anticipation to discharge home. She began dry heaving but no frank emesis.   Physical Exam  BP (!) 148/73 (BP Location: Left Arm)   Pulse 66   Temp 98.7 F (37.1 C)   Resp 14   Ht 5\' 6"  (1.676 m)   Wt 79.4 kg   SpO2 98%   BMI 28.25 kg/m   Physical Exam Vitals signs and nursing note reviewed.  Constitutional:      General: She is not in acute distress.    Appearance: She is well-developed.     Comments: NAD.  HENT:     Head: Normocephalic and atraumatic.     Right Ear: External ear normal.     Left Ear: External ear normal.     Nose: Nose normal.  Eyes:     General: No scleral icterus.    Conjunctiva/sclera: Conjunctivae normal.  Neck:     Musculoskeletal: Normal range of motion and neck supple.  Cardiovascular:     Rate and Rhythm: Normal rate.     Heart sounds: Normal heart sounds.  Pulmonary:     Effort: Pulmonary effort is normal.  Abdominal:     Tenderness: There is abdominal tenderness.     Comments: Mild tenderness to epigastrium.  Negative Murphy's. Negative McBurney's. Soft. No G/R/R.   Musculoskeletal: Normal range of motion.        General: No deformity.  Skin:    General: Skin is warm and dry.     Capillary Refill: Capillary refill takes less than 2 seconds.  Neurological:     Mental Status: She is alert and oriented to person, place, and time.  Psychiatric:        Behavior: Behavior normal.        Thought Content: Thought content normal.        Judgment: Judgment normal.     ED  Course/Procedures     .Critical Care Performed by: Liberty Handy, PA-C Authorized by: Liberty Handy, PA-C   Critical care provider statement:    Critical care time (minutes):  45   Critical care was necessary to treat or prevent imminent or life-threatening deterioration of the following conditions: multiple reassessments and antiemetics, intractable n/v requiring admission    Critical care was time spent personally by me on the following activities:  Discussions with consultants, evaluation of patient's response to treatment, examination of patient, ordering and performing treatments and interventions, ordering and review of laboratory studies, ordering and review of radiographic studies, pulse oximetry, re-evaluation of patient's condition, obtaining history from patient or surrogate and review of old charts   I assumed direction of critical care for this patient from another provider in my specialty: no      MDM   2015: No fever, changes in BM, melena, hematochezia, urinary symptoms.  No distal paresthesias, Cp, SOB, pulse deficits. Labs unremarkable. Doubt acute intraabdominal etiology such as cholecystitis, appendicitis, SBO, perforated viscus, diverticulitis or dissection based on symptoms and exam, chronicity and hospitalization Feb 2020 with normal work up.  Apparently at that  time it was felt opioid use may be contributing to n/v/abd pain. QTC 488, will give a last dose of antiemetics and attempt PO challenge.   2236: refractory nausea/vomiting despite pepcid, reglan x 2, benadryl, haldol, bentyl, fentanyl.  QTC close to 500.  Repeat abdomen exam without peritonitis, mostly epigastric tenderness.  KUB w/o obstructive pattern calculi, free air.  UA pending.  Will request admission for intractable n/v.   2115: Admitted by IM Dr Delma Officer requesing GI consult, pending.     Liberty Handy, PA-C 07/15/18 2114    Gwyneth Sprout, MD 07/21/18 0800

## 2018-07-15 NOTE — ED Notes (Signed)
Pt was vomiting on arrival to room from Xray, appears uncomfortable in bed and is rolling from side to side.

## 2018-07-15 NOTE — ED Notes (Signed)
Pt was advised of need for urine sample, stated she could not go at this time.

## 2018-07-15 NOTE — ED Provider Notes (Addendum)
[facility] EMERGENCY DEPARTMENT Provider Note   CSN: [id number] Arrival date & time: 07/15/18  1519  History   Chief Complaint Chief Complaint  Patient presents with  . Abdominal Pain    HPI Kristen Ramos is a 58 y.o. female with a hx of tobacco abuse, rheumatoid arthritis, abdominal hysterectomy, & prior intractable nausea & vomiting who arrives to the ED via EMS with complaints of N/V & abdominal pain that began at 0200 this AM. Patient states that yesterday was a normal day for her, she went to bed feeling at baseline, and then woke up at 0200 AM with nausea, vomiting, and subsequent abdominal pain. She states she has had approximately 12 episodes of non bloody emesis, a lot of dry heaving, and has started to have generalized abdominal pain which she rates a 10/10 in severity. She has tried taking her prescribed oxycodone & zofran and has subsequently vomited each of these. Unable to keep anything down. Last BM this AM after onset of sxs and normal. Denies fever, chills, hematemesis, diarrhea, constipation, melena, hematochezia, dysuria, vaginal bleeding, or vaginal discharge. Denies recent travel, abx, or suspicious food intake. as hx of similar requiring hospitalization, she does not remember what made it better.   HPI  Past Medical History:  Diagnosis Date  . Arthritis   . Chronic back pain     Patient Active Problem List   Diagnosis Date Noted  . Abdominal pain 04/15/2018  . Nausea & vomiting 04/15/2018  . 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 Intractable nausea and vomiting 02/12/2018  . Epigastric abdominal pain 02/12/2018  . Acute gastroenteritis 02/12/2018    Past Surgical History:  Procedure Laterality Date  . ABDOMINAL HYSTERECTOMY    . BACK SURGERY    . BIOPSY  04/19/2018   Procedure: BIOPSY;  Surgeon: Kerin Salen, MD;  Location: [facility];  Service: Gastroenterology;;  . CESAREAN SECTION    . ESOPHAGOGASTRODUODENOSCOPY (EGD) WITH PROPOFOL  N/A 04/19/2018   Procedure: ESOPHAGOGASTRODUODENOSCOPY (EGD) WITH PROPOFOL;  Surgeon: Kerin Salen, MD;  Location: [facility];  Service: Gastroenterology;  Laterality: N/A;     OB History   No obstetric history on file.      Home Medications    Prior to Admission medications   Medication Sig Start Date End Date Taking? Authorizing Provider  ondansetron (ZOFRAN-ODT) 4 MG disintegrating tablet Take 1 tablet (4 mg total) by mouth every 8 (eight) hours as needed for nausea or vomiting. 04/21/18   Osvaldo Shipper, MD  oxyCODONE (ROXICODONE) 15 MG immediate release tablet Take 15 mg by mouth 4 (four) times daily as needed for pain.  02/03/18   [provider]  pantoprazole (PROTONIX) 40 MG tablet Take 1 tablet (40 mg total) by mouth daily. 04/21/18   Osvaldo Shipper, MD  polyethylene glycol [facility] / Ethelene Hal) packet Take 17 g by mouth daily as needed for mild constipation. 04/21/18   Osvaldo Shipper, MD  tiZANidine (ZANAFLEX) 4 MG capsule Take 4 mg by mouth 2 (two) times daily as needed for muscle spasms. 03/25/18   [provider]    Family History No family history on file.  Social History Social History   Tobacco Use  . Smoking status: Current Every Day Smoker    Types: Cigarettes  . Smokeless tobacco: Never Used  Substance Use Topics  . Alcohol use: No  . Drug use: No     Allergies   Bee venom   Review of Systems Review of Systems  Constitutional: Negative for  chills and fever.  Respiratory: Negative for shortness of breath.   Cardiovascular: Negative for chest pain.  Gastrointestinal: Positive for abdominal pain, nausea and vomiting. Negative for blood in stool, constipation and diarrhea.  Genitourinary: Negative for dysuria, vaginal bleeding and vaginal discharge.  Neurological: Negative for syncope.  All other systems reviewed and are negative.    Physical Exam Updated Vital Signs BP (!) 141/82   Pulse 79   Temp 98.7 F (37.1 C)   Resp 16    Ht 5\' 6"  (1.676 m)   Wt 79.4 kg   SpO2 100%   BMI 28.25 kg/m   Physical Exam Vitals signs and nursing note reviewed.  Constitutional:      Appearance: She is well-developed. She is not toxic-appearing.     Comments: Patient is moving her extremities and rocking back and forth on the stretcher.   HENT:     Head: Normocephalic and atraumatic.  Eyes:     General:        Right eye: No discharge.        Left eye: No discharge.     Conjunctiva/sclera: Conjunctivae normal.  Neck:     Musculoskeletal: Neck supple.  Cardiovascular:     Rate and Rhythm: Normal rate and regular rhythm.  Pulmonary:     Effort: Pulmonary effort is normal. No respiratory distress.     Breath sounds: Normal breath sounds. No wheezing, rhonchi or rales.  Abdominal:     General: There is no distension.     Palpations: Abdomen is soft.     Tenderness: There is abdominal tenderness (mild) in the epigastric area. There is no guarding or rebound.  Skin:    General: Skin is warm and dry.     Findings: No rash.  Neurological:     Mental Status: She is alert.     Comments: Clear speech.   Psychiatric:        Behavior: Behavior normal.    ED Treatments / Results  Labs (all labs ordered are listed, but only abnormal results are displayed) Labs Reviewed - No data to display  EKG EKG Interpretation  Date/Time:  Tuesday Jul 15 2018 15:28:28 EDT Ventricular Rate:  77 PR Interval:    QRS Duration: 115 QT Interval:  431 QTC Calculation: 488 R Axis:   48 Text Interpretation:  Normal sinus rhythm Nonspecific intraventricular conduction delay Minimal ST elevation, anterolateral leads Baseline wander in lead(s) II Artifact No significant change since last tracing Confirmed by Gwyneth SproutPlunkett, Whitney ([id number]) on 07/15/2018 4:20:28 PM   Radiology No results found.  Procedures Procedures (including critical care time)  Medications Ordered in ED Medications - No data to display   Initial Impression / Assessment and  Plan / ED Course  I have reviewed the triage vital signs and the nursing notes.  Pertinent labs & imaging results that were available during my care of the patient were reviewed by me and considered in my medical decision making (see chart for details).    Patient presents to the ED via EMS for N/V w/ abdominal pain. Patient rocking on stretcher, however nontoxic. Vitals WNL with the exception of her elevated BP, low suspicion for HTN emergency. Exam with mild generalized abdominal tenderness, more so in the epigastric area, generalized abdominal tenderness is however distractible. No peritoneal signs. DDX: gastritis, GERD, cholelithiasis, cholecystitis, appendicitis, perf/obstruction, diverticulitis, medication/substance related, chronic pain.   Chart reviewed: Patient has recent hospitalization 04/15/18-04/21/18 for intractable nausea & vomiting, she was seen by GI, underwent  EGD which was unremarkable, CT scan showed biliary ductal dilatation, underwent MRCP which showed same findings without clear cause. Overall unclear etiology to sxs, it was queried that perhaps opioids could be contributing.   Plan: Labs, fluids, analgesics, anti-emetics. Will also obtain EKG to check QTc as foresee possibility of multiple anti-emetics being necessary.   CBC: no leukocytosis/anemia.  Remaining labs: Pending  17:00: Patient sleeping per nursing staff 17:30: Patient awake, now w/ return of nausea & vomiting. Pain is an 8/10 in severity.  Haldol & pepcid ordered.  Shortly after re-eval informed by nursing staff that patient was complaining of a nose bleed, no active epistaxis on re-assessment.   18:00: Patient care transitioned to Sharen Heck PA-C pending remains labs, re-eval, and disposition.   Findings and plan of care discussed with supervising physician Dr. Anitra Lauth who is in agreement.   21:20: Patient ultimately required admission to hospitalist by IM teaching service who has requested  consultation to GI. Change of shift- I have placed consult to GI  21:43: CONSULT: Discussed w/ gastroenterologist Dr. Thompson Grayer - agrees to consult.   Final Clinical Impressions(s) / ED Diagnoses   Final diagnoses:  None    ED Discharge Orders    None       Cherly Anderson, PA-C 07/15/18 2151    Gwyneth Sprout, MD 07/21/18 0759  Addendum for EKG interpretation.    Cherly Anderson, PA-C 07/29/18 0018    Gwyneth Sprout, MD 07/29/18 2001

## 2018-07-15 NOTE — ED Notes (Signed)
Attempted to call report x 1  

## 2018-07-15 NOTE — H&P (Addendum)
Date: 07/15/2018               Patient Name:  Kristen Ramos MRN: [id number]  DOB: 07-08-1960 Age / Sex: 58 y.o., female   PCP: Patient, No Pcp Per         Medical Service: Internal Medicine Teaching Service         Attending Physician: Dr. Rogelia BogaButcher, Austin MilesElizabeth A, MD    First Contact: Dr. Cleaster CorinSeawell Pager: [phone]  Second Contact: Dr. Evelene CroonSantos Pager: [phone]       After Hours (After 5p/  First Contact Pager: [phone]  weekends / holidays): Second Contact Pager: [phone]   Chief Complaint: Nausea, vomiting, abdominal pain  History of Present Illness: Ms. Kristen Ramos is a 58 year old female with rheumatoid arthritis, chronic back pain on opioids, with prior admissions for intractable nausea and vomiting who presents with nausea, vomiting, and abdominal pain.  She states that she was in her usual state of health till around 2 AM when she began to have nausea, vomiting, and diffuse abdominal pain.  She reports up to 14 nonbloody nonbilious vomiting episodes.  She states that she took her opioids and an unknown nausea medication but threw both of these up.  She also reports associated dizziness in route to the hospital but denies lightheadedness and weakness.  She denies fevers, night sweats, chills, chest pain, palpitations, changes in bowel movements, and urinary symptoms.  No sick contacts or recent travel.  She has been admitted several times in the past year most recently in February 2020 for intractable nausea and vomiting. She was seen by gastroenterology and underwent an EGD which was unremarkable.  CT scan showed biliary duct dilatation without obstructing stone.  She then underwent an MRCP which showed the same findings without any clear etiology.  It was felt that perhaps opioids could be contributing to her nausea/vomiting.  She takes oxycodone 4 times daily for back pain. Symptoms improved and she was discharged on a PPI.  She has been out of her PPI for the last month.  She was recommended to  follow-up with GI for potential EUS but this did not occur because of the COVID pandemic.    In the ED she was found to be slightly hypertensive with systolics into the 140s with otherwise unremarkable vital signs.  UA, CBC, and CMP unremarkable.  She was given Reglan x2, Benadryl, Pepcid, Haldol, Bentyl, and fentanyl.  Unfortunately her nausea and vomiting did not improve and IMTS was consulted for admission.  Meds:  No current facility-administered medications on file prior to encounter.    Current Outpatient Medications on File Prior to Encounter  Medication Sig  . ondansetron (ZOFRAN-ODT) 4 MG disintegrating tablet Take 1 tablet (4 mg total) by mouth every 8 (eight) hours as needed for nausea or vomiting.  Marland Kitchen. oxyCODONE (ROXICODONE) 15 MG immediate release tablet Take 15 mg by mouth 4 (four) times daily as needed for pain.   . pantoprazole (PROTONIX) 40 MG tablet Take 1 tablet (40 mg total) by mouth daily.  . polyethylene glycol (MIRALAX / GLYCOLAX) packet Take 17 g by mouth daily as needed for mild constipation.  Marland Kitchen. tiZANidine (ZANAFLEX) 4 MG capsule Take 4 mg by mouth 2 (two) times daily as needed for muscle spasms.    Allergies: Allergies as of 07/15/2018 - Review Complete 07/15/2018  Allergen Reaction Noted  . Bee venom Swelling 12/13/2017   Past Medical History:  Diagnosis Date  . Arthritis   . Chronic back pain  Family History:  No significant family history.   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  She smokes 6 cigarettes a day and has been smoking tobacco for 20 years.  She denies alcohol and illicit drug use.  Review of Systems: A complete ROS was negative except as per HPI.   Physical Exam: Blood pressure (!) 148/73, pulse 66, temperature 98.7 F (37.1 C), resp. rate 14, height 5\' 6"  (1.676 m), weight 79.4 kg, SpO2 98 %. General: Lying in bed with eyes closed.  She begins to dry heave and have vomiting at the end of our exam.  HEENT: Scleral icterus, EOMI, PERRLA Cardiovascular: Normal rate, regular rhythm Respiratory: Clear to auscultation bilaterally, normal work of breathing, normal oxygen saturation on room air Abdominal: Tenderness to palpation of the left lower quadrant >>> right upper quadrant.  Soft.  No masses appreciated. MSK: No lower extremity edema, erythema, or warmth noted bilaterally. Skin: Warm and dry Psych: Normal mood, behavior, affect  EKG: personally reviewed my interpretation is sinus rhythm with prolonged QTC of 488.  Abdominal/chest x-ray: personally reviewed my interpretation is clear lungs, normal heart size, no bony abnormalities.  No evidence of dilated bowel loops or free intraperitoneal air.  Assessment & Plan by Problem: Active Problems:   Intractable nausea and vomiting  Kristen Ramos is a 58 year old female with rheumatoid arthritis, chronic back pain on opioids, with prior admissions for intractable nausea and vomiting who presents with intractable nausea, vomiting, and abdominal pain.  Differential includes viral gastroenteritis, cyclic vomiting syndrome, opioid induced, gastroparesis secondary to chronic opioids.  She does not take any other chronic medications nor uses drugs such as marijuana which could induce nausea vomiting.  She was found to have a biliary duct dilatation without stones during her last admission which was thought to be secondary to chronic opioid use.  I do not believe that this is related to her intermittent nausea and vomiting episodes.  Intractable nausea and vomiting: - IV fluids - Phenergan 12.5 mg every 6 hours PRN - Pantoprazole 40 mg daily - GI consult for possible EUS as suggested during last admission. May need to repeat MRCP as prior was motion degraded and w/o enough IV contrast.  - N.p.o.--advance diet as tolerated.  Prolonged QTc: 488 on EKG - Avoid QT prolonging medications - Continuous cardiac monitoring  Chronic back pain on opioids: -  Hold home oxycodone 15 mg daily   Rheumatoid arthritis: Previously on Xeljanz.  Stopped taking in March 2019 because she could not afford the medication.  She had a follow-up with her rheumatologist today.  Aortic atherosclerosis: Noted on previous imaging.  She was supposed to get a lipid panel and likely start a statin but this did not happen. - Lipid panel  Dispo: Admit patient to Observation with expected length of stay less than 2 midnights.  Signed: Synetta Shadow, MD 07/15/2018, 8:48 PM  Pager: [phone]

## 2018-07-15 NOTE — ED Notes (Signed)
ED TO INPATIENT HANDOFF REPORT  ED Nurse Name and Phone #: Darrelyn HillockShawnette [phone]  S Name/Age/Gender Kristen Ramos 58 y.o. female Room/Bed: 036C/036C  Code Status   Code Status: Full Code  Home/SNF/Other Home Patient oriented to: self, place, time and situation Is this baseline? Yes   Triage Complete: Triage complete  Chief Complaint vomiting  Triage Note Pt BIB [facility] for abdominal pain. Per patient the pain started at 0200 this morning and has gotten worse since then. Per patient this happened a few months ago. EMS reports VSS. Pt unable to sit still in bed upon arrival to ED complaining of 10/10 generalized abdominal pain. Pt reports 12 emesis episodes since 0200.    Allergies Allergies  Allergen Reactions  . Bee Venom Swelling    Level of Care/Admitting Diagnosis ED Disposition    ED Disposition Condition Comment   Admit  [facility] [[id number]]  Level of Care: Telemetry Medical [104]  Covid Evaluation: N/A  Diagnosis: Intractable nausea and vomiting [720114]  Admitting Physician: Erenest BlankBUTCHER, ELIZABETH A [[id number]]  Attending Physician: Blanch MediaBUTCHER, ELIZABETH A [[id number]]  PT Class (Do Not Modify): Observation [104]  PT Acc Code (Do Not Modify): Observation [10022]       B Medical/Surgery History Past Medical History:  Diagnosis Date  . Arthritis   . Chronic back pain    Past Surgical History:  Procedure Laterality Date  . ABDOMINAL HYSTERECTOMY    . BACK SURGERY    . BIOPSY  04/19/2018   Procedure: BIOPSY;  Surgeon: Kerin SalenKarki, Arya, MD;  Location: [facility];  Service: Gastroenterology;;  . CESAREAN SECTION    . ESOPHAGOGASTRODUODENOSCOPY (EGD) WITH PROPOFOL N/A 04/19/2018   Procedure: ESOPHAGOGASTRODUODENOSCOPY (EGD) WITH PROPOFOL;  Surgeon: Kerin SalenKarki, Arya, MD;  Location: [facility];  Service: Gastroenterology;  Laterality: N/A;     A IV Location/Drains/Wounds Patient Lines/Drains/Airways Status   Active Line/Drains/Airways    Name:    Placement date:   Placement time:   Site:   Days:   Peripheral IV 07/15/18 Left Hand   07/15/18    1550    Hand   less than 1          Intake/Output Last 24 hours  Intake/Output Summary (Last 24 hours) at 07/15/2018 2207 Last data filed at 07/15/2018 1922 Gross per 24 hour  Intake 1050 ml  Output -  Net 1050 ml    Labs/Imaging Results for orders placed or performed during the hospital encounter of 07/15/18 (from the past 48 hour(s))  CBC with Differential     Status: None   Collection Time: 07/15/18  3:43 PM  Result Value Ref Range   WBC 6.6 4.0 - 10.5 K/uL   RBC 4.78 3.87 - 5.11 MIL/uL   Hemoglobin 14.4 12.0 - 15.0 g/dL   HCT 95.641.4 21.336.0 - 08.646.0 %   MCV 86.6 80.0 - 100.0 fL   MCH 30.1 26.0 - 34.0 pg   MCHC 34.8 30.0 - 36.0 g/dL   RDW 57.812.7 46.911.5 - 62.915.5 %   Platelets 325 150 - 400 K/uL   nRBC 0.0 0.0 - 0.2 %   Neutrophils Relative % 72 %   Neutro Abs 4.8 1.7 - 7.7 K/uL   Lymphocytes Relative 19 %   Lymphs Abs 1.2 0.7 - 4.0 K/uL   Monocytes Relative 4 %   Monocytes Absolute 0.2 0.1 - 1.0 K/uL   Eosinophils Relative 4 %   Eosinophils Absolute 0.3 0.0 - 0.5 K/uL   Basophils Relative 1 %  Basophils Absolute 0.1 0.0 - 0.1 K/uL   Immature Granulocytes 0 %   Abs Immature Granulocytes 0.01 0.00 - 0.07 K/uL    Comment: Performed at [facility], [address] [place], [place] [address]  Comprehensive metabolic panel     Status: Abnormal   Collection Time: 07/15/18  3:43 PM  Result Value Ref Range   Sodium 136 135 - 145 mmol/L   Potassium 3.7 3.5 - 5.1 mmol/L   Chloride 105 98 - 111 mmol/L   CO2 17 (L) 22 - 32 mmol/L   Glucose, Bld 128 (H) 70 - 99 mg/dL   BUN 8 6 - 20 mg/dL   Creatinine, Ser 6.80 0.44 - 1.00 mg/dL   Calcium 9.6 8.9 - 88.1 mg/dL   Total Protein 7.7 6.5 - 8.1 g/dL   Albumin 4.1 3.5 - 5.0 g/dL   AST 20 15 - 41 U/L   ALT 15 0 - 44 U/L   Alkaline Phosphatase 90 38 - 126 U/L   Total Bilirubin 0.5 0.3 - 1.2 mg/dL   GFR calc non Af Amer >60 >60 mL/min    GFR calc Af Amer >60 >60 mL/min   Anion gap 14 5 - 15    Comment: Performed at [facility], [address], [place], [place] [address]  Lipase, blood     Status: None   Collection Time: 07/15/18  3:43 PM  Result Value Ref Range   Lipase 31 11 - 51 U/L    Comment: Performed at [facility], [address] [place], [place] [address]  Urinalysis, Routine w reflex microscopic     Status: None   Collection Time: 07/15/18  8:20 PM  Result Value Ref Range   Color, Urine YELLOW YELLOW   APPearance CLEAR CLEAR   Specific Gravity, Urine 1.016 1.005 - 1.030   pH 8.0 5.0 - 8.0   Glucose, UA NEGATIVE NEGATIVE mg/dL   Hgb urine dipstick NEGATIVE NEGATIVE   Bilirubin Urine NEGATIVE NEGATIVE   Ketones, ur NEGATIVE NEGATIVE mg/dL   Protein, ur NEGATIVE NEGATIVE mg/dL   Nitrite NEGATIVE NEGATIVE   Leukocytes,Ua NEGATIVE NEGATIVE    Comment: Performed at [facility], [address] [place], [place] [address]  POC Urine Pregnancy, ED (not at [facility])     Status: None   Collection Time: 07/15/18  8:41 PM  Result Value Ref Range   Preg Test, Ur NEGATIVE NEGATIVE    Comment:        THE SENSITIVITY OF THIS METHODOLOGY IS >24 mIU/mL    Dg Abd Acute W/chest  Result Date: 07/15/2018 CLINICAL DATA:  Abdominal pain. EXAM: DG ABDOMEN ACUTE W/ 1V CHEST COMPARISON:  None. FINDINGS: There is no evidence of dilated bowel loops or free intraperitoneal air. No radiopaque calculi or other significant radiographic abnormality is seen. Heart size and mediastinal contours are within normal limits. Both lungs are clear. Orthopedic hardware is noted in the lower lumbar segments. IMPRESSION: Negative abdominal radiographs.  No acute cardiopulmonary disease. Electronically Signed   By: Katherine Mantle M.D.   On: 07/15/2018 18:24    Pending Labs Unresulted Labs (From admission, onward)    Start     Ordered   07/16/18 0500  Hepatic function panel  Tomorrow morning,   R     07/15/18 2206   07/16/18  0500  CBC  Tomorrow morning,   R     07/15/18 2206   07/15/18 2113  SARS Coronavirus 2 (  CEPHEID - Performed in [facility] hospital lab), Hosp Order  (Asymptomatic Patients Labs)  Once,   R    Question:  Rule Out  Answer:  Yes   07/15/18 2112   07/15/18 2059  Rapid urine drug screen (hospital performed)  Add-on,   R     07/15/18 2058          Vitals/Pain Today's Vitals   07/15/18 1900 07/15/18 2000 07/15/18 2045 07/15/18 2115  BP: (!) 145/63 (!) 155/71 (!) 160/78 (!) 142/66  Pulse: 70 78 73 70  Resp:      Temp:      SpO2: 99% 97% 99% 100%  Weight:      Height:      PainSc:        Isolation Precautions No active isolations  Medications Medications  enoxaparin (LOVENOX) injection 40 mg (has no administration in time range)  lactated ringers infusion (has no administration in time range)  acetaminophen (TYLENOL) tablet 650 mg (has no administration in time range)    Or  acetaminophen (TYLENOL) suppository 650 mg (has no administration in time range)  senna-docusate (Senokot-S) tablet 1 tablet (has no administration in time range)  promethazine (PHENERGAN) tablet 12.5 mg (has no administration in time range)  pantoprazole (PROTONIX) EC tablet 40 mg (has no administration in time range)  morphine 4 MG/ML injection 4 mg (4 mg Intravenous Given 07/15/18 1552)  metoCLOPramide (REGLAN) injection 10 mg (10 mg Intravenous Given 07/15/18 1552)  diphenhydrAMINE (BENADRYL) injection 12.5 mg (12.5 mg Intravenous Given 07/15/18 1556)  sodium chloride 0.9 % bolus 1,000 mL (0 mLs Intravenous Stopped 07/15/18 1714)  famotidine (PEPCID) IVPB 20 mg premix (0 mg Intravenous Stopped 07/15/18 1922)  haloperidol lactate (HALDOL) injection 5 mg (5 mg Intravenous Given 07/15/18 1845)  dicyclomine (BENTYL) injection 20 mg (20 mg Intramuscular Given 07/15/18 1956)  fentaNYL (SUBLIMAZE) injection 25 mcg (25 mcg Intravenous Given 07/15/18 2004)  metoCLOPramide (REGLAN) injection 10 mg (10 mg Intravenous Given 07/15/18  2001)    Mobility walks Low fall risk   Focused Assessments Nausea/Vomiting   R Recommendations: See Admitting Provider Note  Report given to:   Additional Notes:

## 2018-07-15 NOTE — ED Triage Notes (Signed)
Pt BIB [facility] for abdominal pain. Per patient the pain started at 0200 this morning and has gotten worse since then. Per patient this happened a few months ago. EMS reports VSS. Pt unable to sit still in bed upon arrival to ED complaining of 10/10 generalized abdominal pain. Pt reports 12 emesis episodes since 0200.

## 2018-07-15 NOTE — ED Notes (Signed)
Pt called and stated that her nose is bleeding and she is spiting up blood I gave her two wet and two dry rags

## 2018-07-16 DIAGNOSIS — I700 Atherosclerosis of aorta: Secondary | ICD-10-CM | POA: Diagnosis present

## 2018-07-16 DIAGNOSIS — R197 Diarrhea, unspecified: Secondary | ICD-10-CM | POA: Diagnosis present

## 2018-07-16 DIAGNOSIS — G8929 Other chronic pain: Secondary | ICD-10-CM

## 2018-07-16 DIAGNOSIS — Z90710 Acquired absence of both cervix and uterus: Secondary | ICD-10-CM | POA: Diagnosis not present

## 2018-07-16 DIAGNOSIS — R112 Nausea with vomiting, unspecified: Secondary | ICD-10-CM

## 2018-07-16 DIAGNOSIS — R9431 Abnormal electrocardiogram [ECG] [EKG]: Secondary | ICD-10-CM

## 2018-07-16 DIAGNOSIS — Z91030 Bee allergy status: Secondary | ICD-10-CM | POA: Diagnosis not present

## 2018-07-16 DIAGNOSIS — Z79891 Long term (current) use of opiate analgesic: Secondary | ICD-10-CM | POA: Diagnosis not present

## 2018-07-16 DIAGNOSIS — Z1159 Encounter for screening for other viral diseases: Secondary | ICD-10-CM | POA: Diagnosis not present

## 2018-07-16 DIAGNOSIS — R1115 Cyclical vomiting syndrome unrelated to migraine: Secondary | ICD-10-CM | POA: Diagnosis present

## 2018-07-16 DIAGNOSIS — Z981 Arthrodesis status: Secondary | ICD-10-CM

## 2018-07-16 DIAGNOSIS — R040 Epistaxis: Secondary | ICD-10-CM | POA: Diagnosis not present

## 2018-07-16 DIAGNOSIS — E785 Hyperlipidemia, unspecified: Secondary | ICD-10-CM | POA: Diagnosis present

## 2018-07-16 DIAGNOSIS — Z79899 Other long term (current) drug therapy: Secondary | ICD-10-CM

## 2018-07-16 DIAGNOSIS — M549 Dorsalgia, unspecified: Secondary | ICD-10-CM

## 2018-07-16 DIAGNOSIS — F17210 Nicotine dependence, cigarettes, uncomplicated: Secondary | ICD-10-CM | POA: Diagnosis present

## 2018-07-16 DIAGNOSIS — K2950 Unspecified chronic gastritis without bleeding: Secondary | ICD-10-CM | POA: Diagnosis present

## 2018-07-16 DIAGNOSIS — K219 Gastro-esophageal reflux disease without esophagitis: Secondary | ICD-10-CM | POA: Diagnosis present

## 2018-07-16 DIAGNOSIS — I10 Essential (primary) hypertension: Secondary | ICD-10-CM | POA: Diagnosis present

## 2018-07-16 DIAGNOSIS — E872 Acidosis: Secondary | ICD-10-CM | POA: Diagnosis present

## 2018-07-16 DIAGNOSIS — M069 Rheumatoid arthritis, unspecified: Secondary | ICD-10-CM | POA: Diagnosis present

## 2018-07-16 DIAGNOSIS — Z720 Tobacco use: Secondary | ICD-10-CM

## 2018-07-16 DIAGNOSIS — E876 Hypokalemia: Secondary | ICD-10-CM | POA: Diagnosis not present

## 2018-07-16 DIAGNOSIS — M545 Low back pain: Secondary | ICD-10-CM | POA: Diagnosis present

## 2018-07-16 LAB — LIPID PANEL
Cholesterol: 259 mg/dL — ABNORMAL HIGH (ref 0–200)
HDL: 60 mg/dL (ref >40)
LDL Cholesterol: 185 mg/dL — ABNORMAL HIGH (ref 0–99)
Total CHOL/HDL Ratio: 4.3 RATIO
Triglycerides: 70 mg/dL (ref <150)
VLDL: 14 mg/dL (ref 0–40)

## 2018-07-16 LAB — CBC
HCT: 42.9 % (ref 36.0–46.0)
Hemoglobin: 15.1 g/dL — ABNORMAL HIGH (ref 12.0–15.0)
MCH: 30.4 pg (ref 26.0–34.0)
MCHC: 35.2 g/dL (ref 30.0–36.0)
MCV: 86.5 fL (ref 80.0–100.0)
Platelets: 236 10*3/uL (ref 150–400)
RBC: 4.96 MIL/uL (ref 3.87–5.11)
RDW: 13.1 % (ref 11.5–15.5)
WBC: 7.8 10*3/uL (ref 4.0–10.5)
nRBC: 0 % (ref 0.0–0.2)

## 2018-07-16 LAB — HEPATIC FUNCTION PANEL
ALT: 15 U/L (ref 0–44)
AST: 18 U/L (ref 15–41)
Albumin: 4.1 g/dL (ref 3.5–5.0)
Alkaline Phosphatase: 89 U/L (ref 38–126)
Bilirubin, Direct: 0.2 mg/dL (ref 0.0–0.2)
Indirect Bilirubin: 0.3 mg/dL (ref 0.3–0.9)
Total Bilirubin: 0.5 mg/dL (ref 0.3–1.2)
Total Protein: 8.6 g/dL — ABNORMAL HIGH (ref 6.5–8.1)

## 2018-07-16 LAB — RAPID URINE DRUG SCREEN, HOSP PERFORMED
Amphetamines: NOT DETECTED
Barbiturates: NOT DETECTED
Benzodiazepines: NOT DETECTED
Cocaine: NOT DETECTED
Opiates: POSITIVE — AB
Tetrahydrocannabinol: NOT DETECTED

## 2018-07-16 LAB — TROPONIN I: Troponin I: <0.03 ng/mL (ref <0.03)

## 2018-07-16 MED ORDER — METOCLOPRAMIDE HCL 5 MG/ML IJ SOLN
10.0000 mg | Freq: Three times a day (TID) | INTRAMUSCULAR | Status: DC | PRN
  Administered 2018-07-16: 10 mg via INTRAVENOUS
  Filled 2018-07-16: qty 2

## 2018-07-16 MED ORDER — SODIUM CHLORIDE 0.9% FLUSH
10.0000 mL | Freq: Two times a day (BID) | INTRAVENOUS | Status: DC
  Administered 2018-07-16 – 2018-07-21 (×9): 10 mL

## 2018-07-16 MED ORDER — LACTATED RINGERS IV SOLN
INTRAVENOUS | Status: AC
  Administered 2018-07-16 (×2): via INTRAVENOUS

## 2018-07-16 MED ORDER — PANTOPRAZOLE SODIUM 40 MG IV SOLR
40.0000 mg | Freq: Every day | INTRAVENOUS | Status: DC
  Administered 2018-07-16 – 2018-07-17 (×2): 40 mg via INTRAVENOUS
  Filled 2018-07-16 (×2): qty 40

## 2018-07-16 MED ORDER — ATORVASTATIN CALCIUM 40 MG PO TABS
40.0000 mg | ORAL_TABLET | Freq: Every day | ORAL | Status: DC
  Filled 2018-07-16 (×2): qty 1

## 2018-07-16 MED ORDER — PROMETHAZINE HCL 25 MG/ML IJ SOLN
12.5000 mg | Freq: Four times a day (QID) | INTRAMUSCULAR | Status: DC | PRN
  Administered 2018-07-16 – 2018-07-17 (×4): 12.5 mg via INTRAVENOUS
  Filled 2018-07-16 (×4): qty 1

## 2018-07-16 MED ORDER — SODIUM CHLORIDE 0.9% FLUSH: 10.0000 mL | INTRAVENOUS | Status: DC | PRN

## 2018-07-16 NOTE — Progress Notes (Signed)
Ms Kristen Ramos was admitted to 5W rm 06. Pt was oriented to unit and fall education completed. Pt verbalized understanding associated with falls. Pt with old surgical scar to lower back otherwise  skin intact.

## 2018-07-16 NOTE — Progress Notes (Signed)
   Subjective: Not feeling good this AM. Continues to feel nauseous. Denies abdominal pain, only nausea. Last emesis episode last night. Unable to tolerate PO intake. Denies chest pain and shortness of breath.   States she gets flares of nausea/emesis every couple of months. They occur spontaneously and resolve after she is admitted to the hospital and treated with anti-emetics for about 5 days. She reports no nausea in between episodes.   Objective:  Vital signs in last 24 hours: Vitals:   07/15/18 2115 07/15/18 2211 07/15/18 2309 07/16/18 0500  BP: (!) 142/66 (!) 142/66 (!) 154/77 125/69  Pulse: 70 70 74 78  Resp:  14 16 16   Temp:   99.8 F (37.7 C) 98.1 F (36.7 C)  TempSrc:   Oral Oral  SpO2: 100% 100% 99%   Weight:    88.2 kg  Height:       Constitution: acute disterss, supine in bed Cardio: RRR, no m/r/g  Abdominal: normal BS, soft, TTP epigastric region and LLQ, non-distended MSK: moving all extremities, no LE edema Neuro: oriented, drowsy during exam  Skin: c/d/i   Assessment/Plan:  Active Problems:   Intractable nausea and vomiting   Intractable Nausea and Vomiting 2/20 EGD showed 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 She continues to have nausea this morning. Did not vomit since 12am but appears to have had bilious vomiting when seen by GI this am. Not yet tolerating po fluids and thus far medications have not helped much. Appears likely to be cyclical vomiting syndrome as workup this admission and in the past has been benign and she is asymptomatic in between bouts every few months. Was supposed to follow-up with GI in the outpt setting but was unable due to hospital admission.   - NPO - GI consulted, appreciate recommendations - gastric emptying scan when nausea resolves - reglan 10 mg tid prn - protonix 40 mg IV qhs started - previously on this but she had run out - LR 100 cc/hr 24 hours - am CMP, Mg  Prolonged QTc Resolved  on am EKG. Will continue to monitor QT prolonging medications   Chronic Back Pain on Opioids RA Takes 15mg  oxycodone TID for back pain. Has RA symptoms in hands and back.   - currently holding opioid medications    VTE: lovenox  IVF: LR Diet: NPO Code: full   Dispo: Anticipated discharge pending clinical improvement and toleration of po medications   Seawell, Jaimie A, DO 07/16/2018, 7:32 AM Pager: [phone]

## 2018-07-16 NOTE — Progress Notes (Signed)
Nutrition Brief Note  Patient identified on the Malnutrition Screening Tool (MST) Report  Wt Readings from Last 15 Encounters:  07/16/18 88.2 kg  04/19/18 86.5 kg  02/02/16 83.9 kg   Ms. Kristen Ramos is a 58 year old female with rheumatoid arthritis, chronic back pain on opioids, with prior admissions for intractable nausea and vomiting who presents with nausea, vomiting, and abdominal pain.  Pt admitted with intractable nausea and vomiting. She is awaiting GI consult.   Pt in with MD at time of visit. Unable to obtain further nutrition-related history or completion nutrition-focused physical exam at this time.   Noted wt has been stable over the past 2.5 years.  Per MST report, py has not been eating poorly due to a decreased appetite and is unsure if she has lost weight.   Body mass index is 31.38 kg/m. Patient meets criteria for obesity, class I based on current BMI.   Current diet order is NPO, patient is consuming approximately n/a% of meals at this time. Labs and medications reviewed.   No nutrition interventions warranted at this time. If nutrition issues arise, please consult RD.   Kristen Vidaurri A. Mayford Ramos, RD, LDN, CDCES Registered Dietitian II Certified Diabetes Care and Education Specialist Pager: [phone] After hours Pager: [phone]

## 2018-07-16 NOTE — Consult Note (Signed)
[facility] Gastroenterology Consult  Referring Provider: Internal Medicine Teaching Service Primary Care Physician:  Patient, No Pcp Per Primary Gastroenterologist: Unassigned/was previously seen by [facility] in 04/2018 when hospitalized, has not been seen in the office  Reason for Consultation: Nausea and vomiting  HPI: Kristen Ramos is a 58 y.o. female was in her usual state of health until yesterday morning when she woke up at 2 AM with nausea and intractable vomiting described as at least 14 nonbloody, nonbilious vomiting.  This was associated with abdominal pain around the umbilicus. Patient had an EGD in 04/2018 for intractable nausea and vomiting and had an unremarkable biopsy from antrum for H. pylori and small bowel for celiac disease.    She was scheduled for an office visit today with me. Patient has had ultrasounds, CAT scan and MRCP which have shown nonspecific biliary dilatation without obstructing lesion or choledocholithiasis, unremarkable LFTs and was being considered for an outpatient EUS for biliary dilatation.  Patient denies use of marijuana, takes oxycodone 15 mg 4 times a day for chronic back pain. She denies unintentional weight loss, early satiety, chnage in bowel habits, blood in stool or black stool.  For the last several years, patient gets cyclical episodes of intractable nausea and vomiting which improves after a few days of IV fluid resuscitation and antiemetics.  She takes Zofran as needed every 8 hours at home.  Patient denies fevers, sick contacts or recent travel. Patient received Pepcid, Reglan, Benadryl, Haldol, Bentyl and fentanyl in the ER. U tox was positive for opioids, urine pregnancy test was negative. Abdominal x-ray showed no evidence of dilated bowel loops or free intraperitoneal air.   Past Medical History:  Diagnosis Date  . Arthritis   . Chronic back pain     Past Surgical History:  Procedure Laterality Date  . ABDOMINAL HYSTERECTOMY    .  BACK SURGERY    . BIOPSY  04/19/2018   Procedure: BIOPSY;  Surgeon: Kerin Salen, MD;  Location: [facility];  Service: Gastroenterology;;  . CESAREAN SECTION    . ESOPHAGOGASTRODUODENOSCOPY (EGD) WITH PROPOFOL N/A 04/19/2018   Procedure: ESOPHAGOGASTRODUODENOSCOPY (EGD) WITH PROPOFOL;  Surgeon: Kerin Salen, MD;  Location: [facility];  Service: Gastroenterology;  Laterality: N/A;    Prior to Admission medications   Medication Sig Start Date End Date Taking? Authorizing Provider  ondansetron (ZOFRAN-ODT) 4 MG disintegrating tablet Take 1 tablet (4 mg total) by mouth every 8 (eight) hours as needed for nausea or vomiting. 04/21/18   Osvaldo Shipper, MD  oxyCODONE (ROXICODONE) 15 MG immediate release tablet Take 15 mg by mouth 4 (four) times daily as needed for pain.  02/03/18   [provider]  pantoprazole (PROTONIX) 40 MG tablet Take 1 tablet (40 mg total) by mouth daily. 04/21/18   Osvaldo Shipper, MD  polyethylene glycol [facility] Ethelene Hal) packet Take 17 g by mouth daily as needed for mild constipation. 04/21/18   Osvaldo Shipper, MD  tiZANidine (ZANAFLEX) 4 MG capsule Take 4 mg by mouth 2 (two) times daily as needed for muscle spasms. 03/25/18   [provider]    Current Facility-Administered Medications  Medication Dose Route Frequency Provider Last Rate Last Dose  . acetaminophen (TYLENOL) tablet 650 mg  650 mg Oral Q6H PRN Chundi, Vahini, MD       Or  . acetaminophen (TYLENOL) suppository 650 mg  650 mg Rectal Q6H PRN Chundi, Vahini, MD      . atorvastatin (LIPITOR) tablet 40 mg  40 mg Oral q1800  Chundi, Vahini, MD      . enoxaparin (LOVENOX) injection 40 mg  40 mg Subcutaneous Q24H Chundi, Vahini, MD   40 mg at 07/16/18 0035  . lactated ringers infusion   Intravenous Continuous Santos-Sanchez, Idalys, MD      . pantoprazole (PROTONIX) EC tablet 40 mg  40 mg Oral Daily Chundi, Vahini, MD   40 mg at 07/16/18 0035  . promethazine (PHENERGAN) injection 12.5 mg  12.5 mg  Intravenous Q6H PRN Synetta Shadow, MD   12.5 mg at 07/16/18 0645  . senna-docusate (Senokot-S) tablet 1 tablet  1 tablet Oral QHS PRN Chundi, Vahini, MD      . sodium chloride flush (NS) 0.9 % injection 10-40 mL  10-40 mL Intracatheter Q12H Burns Spain, MD      . sodium chloride flush (NS) 0.9 % injection 10-40 mL  10-40 mL Intracatheter PRN Burns Spain, MD        Allergies as of 07/15/2018 - Review Complete 07/15/2018  Allergen Reaction Noted  . Bee venom Swelling 12/13/2017    History reviewed. No pertinent family history.  Social History   Socioeconomic History  . Marital status: Single    Spouse name: Not on file  . Number of children: Not on file  . Years of education: Not on file  . Highest education level: Not on file  Occupational History  . Not on file  Social Needs  . Financial resource strain: Not on file  . Food insecurity:    Worry: Not on file    Inability: Not on file  . Transportation needs:    Medical: Not on file    Non-medical: Not on file  Tobacco Use  . Smoking status: Current Every Day Smoker    Types: Cigarettes  . Smokeless tobacco: Never Used  Substance and Sexual Activity  . Alcohol use: No  . Drug use: No  . Sexual activity: Not on file  Lifestyle  . Physical activity:    Days per week: Not on file    Minutes per session: Not on file  . Stress: Not on file  Relationships  . Social connections:    Talks on phone: Not on file    Gets together: Not on file    Attends religious service: Not on file    Active member of club or organization: Not on file    Attends meetings of clubs or organizations: Not on file    Relationship status: Not on file  . Intimate partner violence:    Fear of current or ex partner: Not on file    Emotionally abused: Not on file    Physically abused: Not on file    Forced sexual activity: Not on file  Other Topics Concern  . Not on file  Social History Narrative  . Not on file    Review  of Systems:  GI: Described in detail in HPI.    Gen: Denies any fever, chills, rigors, night sweats, anorexia, fatigue, weakness, malaise, involuntary weight loss, and sleep disorder CV: Denies chest pain, angina, palpitations, syncope, orthopnea, PND, peripheral edema, and claudication. Resp: Denies dyspnea, cough, sputum, wheezing, coughing up blood. GU : Denies urinary burning, blood in urine, urinary frequency, urinary hesitancy, nocturnal urination, and urinary incontinence. MS: Back pain, denies joint pain or swelling.  Denies muscle weakness, cramps, atrophy.  Derm: Denies rash, itching, oral ulcerations, hives, unhealing ulcers.  Psych: Denies depression, anxiety, memory loss, suicidal ideation, hallucinations,  and confusion. Heme:  Denies bruising, bleeding, and enlarged lymph nodes. Neuro:  Denies any headaches, dizziness, paresthesias. Endo:  Denies any problems with DM, thyroid, adrenal function.  Physical Exam: Vital signs in last 24 hours: Temp:  [98.1 F (36.7 C)-99.8 F (37.7 C)] 98.1 F (36.7 C) (05/06 0500) Pulse Rate:  [66-79] 78 (05/06 0500) Resp:  [14-16] 16 (05/06 0500) BP: (125-160)/(63-84) 125/69 (05/06 0500) SpO2:  [97 %-100 %] 99 % (05/05 2309) Weight:  [79.4 kg-88.2 kg] 88.2 kg (05/06 0500)    General:   Alert,  Well-developed, well-nourished, pleasant and cooperative in NAD  Had an episode of gagging and bilious vomiting during interview/physical exam  Head:  Normocephalic and atraumatic. Eyes:  Sclera clear, no icterus.   Conjunctiva pink. Ears:  Normal auditory acuity. Nose:  No deformity, discharge,  or lesions. Mouth:  No deformity or lesions.  Oropharynx pink & moist. Neck:  Supple; no masses or thyromegaly. Lungs:  Clear throughout to auscultation.   No wheezes, crackles, or rhonchi. No acute distress. Heart:  Regular rate and rhythm; no murmurs, clicks, rubs,  or gallops. Extremities:  Without clubbing or edema. Neurologic:  Alert and   oriented x4;  grossly normal neurologically. Skin:  Intact without significant lesions or rashes. Psych:  Alert and cooperative. Normal mood and affect. Abdomen:  Soft, nontender and nondistended. No masses, hepatosplenomegaly or hernias noted.  Hyperactive bowel sounds, without guarding, and without rebound.         Lab Results: Recent Labs    07/15/18 1543 07/16/18 0234  WBC 6.6 7.8  HGB 14.4 15.1*  HCT 41.4 42.9  PLT 325 236   BMET Recent Labs    07/15/18 1543  NA 136  K 3.7  CL 105  CO2 17*  GLUCOSE 128*  BUN 8  CREATININE 0.91  CALCIUM 9.6   LFT Recent Labs    07/16/18 0234  PROT 8.6*  ALBUMIN 4.1  AST 18  ALT 15  ALKPHOS 89  BILITOT 0.5  BILIDIR 0.2  IBILI 0.3   PT/INR No results for input(s): LABPROT, INR in the last 72 hours.  Studies/Results: Dg Abd Acute W/chest  Result Date: 07/15/2018 CLINICAL DATA:  Abdominal pain. EXAM: DG ABDOMEN ACUTE W/ 1V CHEST COMPARISON:  None. FINDINGS: There is no evidence of dilated bowel loops or free intraperitoneal air. No radiopaque calculi or other significant radiographic abnormality is seen. Heart size and mediastinal contours are within normal limits. Both lungs are clear. Orthopedic hardware is noted in the lower lumbar segments. IMPRESSION: Negative abdominal radiographs.  No acute cardiopulmonary disease. Electronically Signed   By: Katherine Mantle M.D.   On: 07/15/2018 18:24    Impression: Intractable nausea and vomiting which appears to be cyclical, usually once every several months, last of such episode was in 04/2018. Unclear etiology, possibly related to opioid use, possibility of gastroparesis worsened by opioid use.  Nonspecific biliary dilatation noted on prior imaging with unremarkable LFTs, T bili 0.5/AST 20/ALT 15/ALP 90  Mild acidosis without renal impairment  Plan: Keep patient n.p.o. for now Continue IV fluids, currently receiving lactated Ringer's at 100 cc/h, monitor electrolytes and  supplement potassium and magnesium as needed. We will keep patient on Reglan 10 mg IV every 8 hours as needed, while continuing Phenergan 12.5 mg every 6 hours as needed. Patient will benefit from avoidance of narcotics, has received fentanyl 25 mcg IV once and Reglan 4 mg IV once. Patient will benefit from a gastric emptying scan, can only be done when nausea  vomiting is better controlled. Will continue pantoprazole which will need to be switched to IV from p.o.     LOS: 0 days   Kerin SalenArya Drake Wuertz, MD  07/16/2018, 10:58 AM  Pager [phone] If no answer or after 5 PM call [phone]

## 2018-07-17 LAB — COMPREHENSIVE METABOLIC PANEL
ALT: 26 U/L (ref 0–44)
AST: 30 U/L (ref 15–41)
Albumin: 3.6 g/dL (ref 3.5–5.0)
Alkaline Phosphatase: 82 U/L (ref 38–126)
Anion gap: 14 (ref 5–15)
BUN: 11 mg/dL (ref 6–20)
CO2: 22 mmol/L (ref 22–32)
Calcium: 8.8 mg/dL — ABNORMAL LOW (ref 8.9–10.3)
Chloride: 102 mmol/L (ref 98–111)
Creatinine, Ser: 0.69 mg/dL (ref 0.44–1.00)
GFR calc Af Amer: >60 mL/min (ref >60)
GFR calc non Af Amer: >60 mL/min (ref >60)
Glucose, Bld: 114 mg/dL — ABNORMAL HIGH (ref 70–99)
Potassium: 3.6 mmol/L (ref 3.5–5.1)
Sodium: 138 mmol/L (ref 135–145)
Total Bilirubin: 1.2 mg/dL (ref 0.3–1.2)
Total Protein: 7.3 g/dL (ref 6.5–8.1)

## 2018-07-17 LAB — MAGNESIUM: Magnesium: 2.2 mg/dL (ref 1.7–2.4)

## 2018-07-17 MED ORDER — LACTATED RINGERS IV SOLN
INTRAVENOUS | Status: DC
  Administered 2018-07-17: 15:00:00 via INTRAVENOUS

## 2018-07-17 MED ORDER — PROMETHAZINE HCL 25 MG/ML IJ SOLN
12.5000 mg | Freq: Four times a day (QID) | INTRAMUSCULAR | Status: DC | PRN
  Administered 2018-07-17 – 2018-07-19 (×3): 12.5 mg via INTRAVENOUS
  Filled 2018-07-17 (×3): qty 1

## 2018-07-17 NOTE — TOC Initial Note (Addendum)
Transition of Care [facility]) - Initial/Assessment Note    Patient Details  Name: Kristen Ramos MRN: [id number] Date of Birth: Nov 02, 1960  Transition of [facility]) CM/SW Contact:    Epifanio Lesches, RN Phone Number: 07/17/2018, 10:24 AM  Clinical Narrative:        Presents with intractable N/V, hx of rheumatoid arthritis and chronic back pain on chronic opioid therapy. Pt states uses rheumatologist as PCP, Dr. Deanne Coffer.  From home with son. PTA independent with ADL's, no DME usage. Pt states without transportation or medication needs.  Alphonsus Sias (Daughter)     [phone]      NCM will continue to monitor for TOC needs....  Expected Discharge Plan: Home/Self Care Barries to discharge: Continued Medical Work up   Patient Goals and CMS Choice Patient states their goals for this hospitalization and ongoing recovery are:: to feel better CMS Medicare.gov Compare Post Acute Care list provided to:: Patient Choice offered to / list presented to : NA  Expected Discharge Plan and Services Expected Discharge Plan: Home/Self Care In-house Referral: NA Discharge Planning Services: CM Consult Post Acute Care Choice: NA Living arrangements for the past 2 months: Single Family Home                 DME Arranged: N/A DME Agency: NA                  Prior Living Arrangements/Services Living arrangements for the past 2 months: Single Family Home Lives with:: Adult Children(Resides with son) Patient language and need for interpreter reviewed:: Yes Do you feel safe going back to the place where you live?: Yes      Need for Family Participation in Patient Care: No (Comment) Care giver support system in place?: No (comment)   Criminal Activity/Legal Involvement Pertinent to Current Situation/Hospitalization: No - Comment as needed  Activities of Daily Living Home Assistive Devices/Equipment: None ADL Screening (condition at time of admission) Patient's cognitive ability adequate  to safely complete daily activities?: Yes Is the patient deaf or have difficulty hearing?: No Does the patient have difficulty seeing, even when wearing glasses/contacts?: No Does the patient have difficulty concentrating, remembering, or making decisions?: No Patient able to express need for assistance with ADLs?: Yes Does the patient have difficulty dressing or bathing?: Yes( minimal assistance 2/2 acute illness  otherwise independent) Independently performs ADLs?: Yes (appropriate for developmental age) Does the patient have difficulty walking or climbing stairs?: No Weakness of Legs: None Weakness of Arms/Hands: None  Permission Sought/Granted Permission sought to share information with : Case Manager, Family Supports Permission granted to share information with : Yes, Verbal Permission Granted  Share Information with NAME: Sports administrator (Daughter)           Emotional Assessment Appearance:: Appears stated age Attitude/Demeanor/Rapport: Engaged Affect (typically observed): Accepting Orientation: : Oriented to Self, Oriented to  Time, Oriented to Place, Oriented to Situation Alcohol / Substance Use: Tobacco Use Psych Involvement: No (comment)  Admission diagnosis:  Intractable vomiting with nausea, unspecified vomiting type [R11.2] Patient Active Problem List   Diagnosis Date Noted  . Abdominal pain 04/15/2018  . Nausea & vomiting 04/15/2018  . 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 Intractable nausea and vomiting 02/12/2018  . Epigastric abdominal pain 02/12/2018  . Acute gastroenteritis 02/12/2018   PCP:  Casimer Lanius, MD Pharmacy:   [facility] [phone] - [place], [place] - [address] AT [facility] [address] [place]  [address] Phone: [phone] Fax: [phone]     Social Determinants of Health (SDOH) Interventions    Readmission Risk Interventions No flowsheet data found.

## 2018-07-17 NOTE — Progress Notes (Signed)
   Subjective:  States her nausea is somewhat improved today although she later had retching prior to leaving room. Prior to this she states she vomited once overnight. She denies abdominal pain. She had a bowel movement yesterday and states she has normal bowel movements at home.  She states she does not have nausea in between episodes that are occurring every few months but at night she has phlegm that comes up. She does not think it is from coughing but more like reflux.   Objective:  Vital signs in last 24 hours: Vitals:   07/16/18 0500 07/16/18 1351 07/16/18 2135 07/17/18 0546  BP: 125/69 (!) 156/82 121/80 (!) 143/71  Pulse: 78 72 71 71  Resp: 16 20 16 16   Temp: 98.1 F (36.7 C) 99 F (37.2 C) 98.2 F (36.8 C) 98.8 F (37.1 C)  TempSrc: Oral Oral Oral Oral  SpO2:  100% 100% 100%  Weight: 88.2 kg     Height:       Constitution: supine in bed, mild distress HENT: [place]/AT Eyes: eom intact Cardio: rrr/ no m/r/g  Abdominal: diffusely TTP, decreased from yesterday, soft, non-distended, normal bowel sounds  MSK: no edmea, moving all extremties  Skin: c/d/i    Assessment/Plan:  Active Problems:   Intractable nausea and vomiting  58yo female with PMH RA, chronic back pain on opioid therapy, and intermittent intractable nausea and vomiting occurring every several months, asymptomatic between episodes. Admitted for intractable n/v, continuing to have symptoms. GI consulted.    Intractable Nausea and Vomiting Appears likely cyclical vomiting syndrome as episodes occur every few months and she is asymptomatic between. EGD on previous admission showed mild gastritis. She has normal daily bowel movements, no history of marijuana use. Prev. MRCP did show biliary ductal dilation although pain is diffuse, labs have been normal and no specific etiology was found during MRCP. Continues to have symptoms, albeit somewhat improved today, despite medications. She was able to tolerate small amount  fluid po but had more vomiting afterward. Medications somewhat limited by previous QTc on admission although this has resolved. GI consulted, appreciate recommendations.   - consider intranasal sumatriptan as potential abortive therapy at discharge - GI plans for gastric emptying study once symptoms improve - ordered for tomorrow - continue phenergan 12.5 mg q6h prn, reglan 10 mg tid prn  - continue protonix 40 mg IV qd - continue LR 100 cc/hr while NPO, ordered for another 24 hours - am CBC, CMP, Mg - d/c telemetry  RA Chronic Back Pain Currently holding opioid medications. She has not endorsed back pain. Will monitor.   VTE: lovenox IVF: LR 100 cc/hr Diet: NPO  Code: full   Dispo: Anticipated discharge pending improvement in symptoms and po intake.   Versie Starks, DO 07/17/2018, 6:49 AM Pager: [phone]

## 2018-07-17 NOTE — Progress Notes (Signed)
Subjective: The patient continues to have bilious, nonbloody vomiting, the frequency has decreased to about 3-4 in the last 24 hours.  She continues to have mid abdominal soreness, likely from intermittent retching.  Objective: Vital signs in last 24 hours: Temp:  [98.2 F (36.8 C)-99 F (37.2 C)] 98.8 F (37.1 C) (05/07 0546) Pulse Rate:  [71-72] 71 (05/07 0546) Resp:  [16-20] 16 (05/07 0546) BP: (121-156)/(71-82) 143/71 (05/07 0546) SpO2:  [100 %] 100 % (05/07 0546) Weight:  [87.7 kg] 87.7 kg (05/07 0546) Weight change: 8.32 kg    PE: Lying on bed GENERAL: Emesis bag at bedside with small amount of bilious vomiting, not in distress ABDOMEN: Soft, mild mid abdominal tenderness EXTREMITIES: No deformity  Lab Results: Results for orders placed or performed during the hospital encounter of 07/15/18 (from the past 48 hour(s))  CBC with Differential     Status: None   Collection Time: 07/15/18  3:43 PM  Result Value Ref Range   WBC 6.6 4.0 - 10.5 K/uL   RBC 4.78 3.87 - 5.11 MIL/uL   Hemoglobin 14.4 12.0 - 15.0 g/dL   HCT 16.141.4 09.636.0 - 04.546.0 %   MCV 86.6 80.0 - 100.0 fL   MCH 30.1 26.0 - 34.0 pg   MCHC 34.8 30.0 - 36.0 g/dL   RDW 40.912.7 81.111.5 - 91.415.5 %   Platelets 325 150 - 400 K/uL   nRBC 0.0 0.0 - 0.2 %   Neutrophils Relative % 72 %   Neutro Abs 4.8 1.7 - 7.7 K/uL   Lymphocytes Relative 19 %   Lymphs Abs 1.2 0.7 - 4.0 K/uL   Monocytes Relative 4 %   Monocytes Absolute 0.2 0.1 - 1.0 K/uL   Eosinophils Relative 4 %   Eosinophils Absolute 0.3 0.0 - 0.5 K/uL   Basophils Relative 1 %   Basophils Absolute 0.1 0.0 - 0.1 K/uL   Immature Granulocytes 0 %   Abs Immature Granulocytes 0.01 0.00 - 0.07 K/uL    Comment: Performed at [facility], [address] [place], [place] [address]  Comprehensive metabolic panel     Status: Abnormal   Collection Time: 07/15/18  3:43 PM  Result Value Ref Range   Sodium 136 135 - 145 mmol/L   Potassium 3.7 3.5 - 5.1 mmol/L   Chloride 105 98  - 111 mmol/L   CO2 17 (L) 22 - 32 mmol/L   Glucose, Bld 128 (H) 70 - 99 mg/dL   BUN 8 6 - 20 mg/dL   Creatinine, Ser 6.210.91 0.44 - 1.00 mg/dL   Calcium 9.6 8.9 - 30.810.3 mg/dL   Total Protein 7.7 6.5 - 8.1 g/dL   Albumin 4.1 3.5 - 5.0 g/dL   AST 20 15 - 41 U/L   ALT 15 0 - 44 U/L   Alkaline Phosphatase 90 38 - 126 U/L   Total Bilirubin 0.5 0.3 - 1.2 mg/dL   GFR calc non Af Amer >60 >60 mL/min   GFR calc Af Amer >60 >60 mL/min   Anion gap 14 5 - 15    Comment: Performed at [facility], [address] [place], [place] [address]  Lipase, blood     Status: None   Collection Time: 07/15/18  3:43 PM  Result Value Ref Range   Lipase 31 11 - 51 U/L    Comment: Performed at [facility], [address] [place], [place] [address]  Urinalysis, Routine w reflex microscopic  Status: None   Collection Time: 07/15/18  8:20 PM  Result Value Ref Range   Color, Urine YELLOW YELLOW   APPearance CLEAR CLEAR   Specific Gravity, Urine 1.016 1.005 - 1.030   pH 8.0 5.0 - 8.0   Glucose, UA NEGATIVE NEGATIVE mg/dL   Hgb urine dipstick NEGATIVE NEGATIVE   Bilirubin Urine NEGATIVE NEGATIVE   Ketones, ur NEGATIVE NEGATIVE mg/dL   Protein, ur NEGATIVE NEGATIVE mg/dL   Nitrite NEGATIVE NEGATIVE   Leukocytes,Ua NEGATIVE NEGATIVE    Comment: Performed at [facility], [address], [place], [place] [address]  POC Urine Pregnancy, ED (not at [facility])     Status: None   Collection Time: 07/15/18  8:41 PM  Result Value Ref Range   Preg Test, Ur NEGATIVE NEGATIVE    Comment:        THE SENSITIVITY OF THIS METHODOLOGY IS >24 mIU/mL   Rapid urine drug screen (hospital performed)     Status: Abnormal   Collection Time: 07/15/18  8:59 PM  Result Value Ref Range   Opiates POSITIVE (A) NONE DETECTED   Cocaine NONE DETECTED NONE DETECTED   Benzodiazepines NONE DETECTED NONE DETECTED   Amphetamines NONE DETECTED NONE DETECTED   Tetrahydrocannabinol NONE DETECTED NONE DETECTED   Barbiturates  NONE DETECTED NONE DETECTED    Comment: (NOTE) DRUG SCREEN FOR MEDICAL PURPOSES ONLY.  IF CONFIRMATION IS NEEDED FOR ANY PURPOSE, NOTIFY LAB WITHIN 5 DAYS. LOWEST DETECTABLE LIMITS FOR URINE DRUG SCREEN Drug Class                     Cutoff (ng/mL) Amphetamine and metabolites    1000 Barbiturate and metabolites    200 Benzodiazepine                 200 Tricyclics and metabolites     300 Opiates and metabolites        300 Cocaine and metabolites        300 THC                            50 Performed at [facility], [address], [place], [place] [address]   SARS Coronavirus 2 (CEPHEID - Performed in [facility] hospital lab), Hosp Order     Status: None   Collection Time: 07/15/18  9:53 PM  Result Value Ref Range   SARS Coronavirus 2 NEGATIVE NEGATIVE    Comment: (NOTE) If result is NEGATIVE SARS-CoV-2 target nucleic acids are NOT DETECTED. The SARS-CoV-2 RNA is generally detectable in upper and lower  respiratory specimens during the acute phase of infection. The lowest  concentration of SARS-CoV-2 viral copies this assay can detect is 250  copies / mL. A negative result does not preclude SARS-CoV-2 infection  and should not be used as the sole basis for treatment or other  patient management decisions.  A negative result may occur with  improper specimen collection / handling, submission of specimen other  than nasopharyngeal swab, presence of viral mutation(s) within the  areas targeted by this assay, and inadequate number of viral copies  (<250 copies / mL). A negative result must be combined with clinical  observations, patient history, and epidemiological information. If result is POSITIVE SARS-CoV-2 target nucleic acids are DETECTED. The SARS-CoV-2 RNA is generally detectable in upper and lower  respiratory specimens dur ing the acute phase of infection.  Positive  results are indicative  of active infection with SARS-CoV-2.  Clinical  correlation with patient  history and other diagnostic information is  necessary to determine patient infection status.  Positive results do  not rule out bacterial infection or co-infection with other viruses. If result is PRESUMPTIVE POSTIVE SARS-CoV-2 nucleic acids MAY BE PRESENT.   A presumptive positive result was obtained on the submitted specimen  and confirmed on repeat testing.  While 2019 novel coronavirus  (SARS-CoV-2) nucleic acids may be present in the submitted sample  additional confirmatory testing may be necessary for epidemiological  and / or clinical management purposes  to differentiate between  SARS-CoV-2 and other Sarbecovirus currently known to infect humans.  If clinically indicated additional testing with an alternate test  methodology [phone]) is advised. The SARS-CoV-2 RNA is generally  detectable in upper and lower respiratory sp ecimens during the acute  phase of infection. The expected result is Negative. Fact Sheet for Patients:  BoilerBrush.com.cy Fact Sheet for Healthcare Providers: https://pope.com/ This test is not yet approved or cleared by the [place] FDA and has been authorized for detection and/or diagnosis of SARS-CoV-2 by FDA under an Emergency Use Authorization (EUA).  This EUA will remain in effect (meaning this test can be used) for the duration of the COVID-19 declaration under Section 564(b)(1) of the Act, 21 U.S.C. section 360bbb-3(b)(1), unless the authorization is terminated or revoked sooner. Performed at [facility], [address], [place], [place] [address]   Hepatic function panel     Status: Abnormal   Collection Time: 07/16/18  2:34 AM  Result Value Ref Range   Total Protein 8.6 (H) 6.5 - 8.1 g/dL   Albumin 4.1 3.5 - 5.0 g/dL   AST 18 15 - 41 U/L   ALT 15 0 - 44 U/L   Alkaline Phosphatase 89 38 - 126 U/L   Total Bilirubin 0.5 0.3 - 1.2 mg/dL   Bilirubin, Direct 0.2 0.0 - 0.2 mg/dL    Indirect Bilirubin 0.3 0.3 - 0.9 mg/dL    Comment: Performed at [facility], [address], [place], [place] [address]  CBC     Status: Abnormal   Collection Time: 07/16/18  2:34 AM  Result Value Ref Range   WBC 7.8 4.0 - 10.5 K/uL   RBC 4.96 3.87 - 5.11 MIL/uL   Hemoglobin 15.1 (H) 12.0 - 15.0 g/dL   HCT 47.8 29.5 - 62.1 %   MCV 86.5 80.0 - 100.0 fL   MCH 30.4 26.0 - 34.0 pg   MCHC 35.2 30.0 - 36.0 g/dL   RDW 30.8 65.7 - 84.6 %   Platelets 236 150 - 400 K/uL   nRBC 0.0 0.0 - 0.2 %    Comment: Performed at [facility], [address], [place], [place] [address]  Lipid panel     Status: Abnormal   Collection Time: 07/16/18  2:34 AM  Result Value Ref Range   Cholesterol 259 (H) 0 - 200 mg/dL   Triglycerides 70 <284 mg/dL   HDL 60 >13 mg/dL   Total CHOL/HDL Ratio 4.3 RATIO   VLDL 14 0 - 40 mg/dL   LDL Cholesterol 244 (H) 0 - 99 mg/dL    Comment:        Total Cholesterol/HDL:CHD Risk Coronary Heart Disease Risk Table                     Men   Women  1/2 Average Risk   3.4  3.3  Average Risk       5.0   4.4  2 X Average Risk   9.6   7.1  3 X Average Risk  23.4   11.0        Use the calculated Patient Ratio above and the CHD Risk Table to determine the patient's CHD Risk.        ATP III CLASSIFICATION (LDL):  <100     mg/dL   Optimal  [id number]  mg/dL   Near or Above                    Optimal  130-159  mg/dL   Borderline  [id number]  mg/dL   High  >811>190     mg/dL   Very High Performed at [facility], [address] [place], [place] [address]   Troponin I - Once     Status: None   Collection Time: 07/16/18 10:27 AM  Result Value Ref Range   Troponin I <0.03 <0.03 ng/mL    Comment: Performed at [facility], [address] [place], [place] [address]  Comprehensive metabolic panel     Status: Abnormal   Collection Time: 07/17/18  4:07 AM  Result Value Ref Range   Sodium 138 135 - 145 mmol/L   Potassium 3.6 3.5 - 5.1 mmol/L   Chloride 102 98 -  111 mmol/L   CO2 22 22 - 32 mmol/L   Glucose, Bld 114 (H) 70 - 99 mg/dL   BUN 11 6 - 20 mg/dL   Creatinine, Ser 1.300.69 0.44 - 1.00 mg/dL   Calcium 8.8 (L) 8.9 - 10.3 mg/dL   Total Protein 7.3 6.5 - 8.1 g/dL   Albumin 3.6 3.5 - 5.0 g/dL   AST 30 15 - 41 U/L   ALT 26 0 - 44 U/L   Alkaline Phosphatase 82 38 - 126 U/L   Total Bilirubin 1.2 0.3 - 1.2 mg/dL   GFR calc non Af Amer >60 >60 mL/min   GFR calc Af Amer >60 >60 mL/min   Anion gap 14 5 - 15    Comment: Performed at [facility], [address] [place], [place] [address]  Magnesium     Status: None   Collection Time: 07/17/18  4:07 AM  Result Value Ref Range   Magnesium 2.2 1.7 - 2.4 mg/dL    Comment: Performed at [facility], [address] [place], [place] [address]    Studies/Results: Dg Abd Acute W/chest  Result Date: 07/15/2018 CLINICAL DATA:  Abdominal pain. EXAM: DG ABDOMEN ACUTE W/ 1V CHEST COMPARISON:  None. FINDINGS: There is no evidence of dilated bowel loops or free intraperitoneal air. No radiopaque calculi or other significant radiographic abnormality is seen. Heart size and mediastinal contours are within normal limits. Both lungs are clear. Orthopedic hardware is noted in the lower lumbar segments. IMPRESSION: Negative abdominal radiographs.  No acute cardiopulmonary disease. Electronically Signed   By: Katherine Mantlehristopher  Green M.D.   On: 07/15/2018 18:24    Medications: I have reviewed the patient's current medications.  Assessment: Intractable nausea and vomiting, cyclical vomiting syndrome Appears to be improving but has not resolved  Plan: Patient unable to tolerate fluids such as water, keep patient n.p.o. for now, continue IV fluids with monitoring of electrolytes and supplementation as needed. Has not been on narcotics since yesterday. Continue Reglan 10 mg 3 times a day as needed along with Phenergan 12.5 mg every  6 hours as needed for now. If there are no further episodes of vomiting, plan  gastric emptying scan tomorrow.    Kerin Salen 07/17/2018, 10:08 AM   Pager [phone] If no answer or after 5 PM call [phone]

## 2018-07-17 NOTE — Progress Notes (Signed)
  Date: 07/17/2018  Patient name: Kristen Ramos  Medical record number: [id number]  Date of birth: 15-Sep-1960   I have seen and evaluated this patient and I have discussed the plan of care with the house staff. Please see their note for complete details. I concur with their findings with the following additions/corrections: [name] was seen this morning on team rounds.  She appears to be in less distress today than she was yesterday but continues to have vomiting of liquids.  We again clarified that she is asymptomatic between hospitalizations.  The only symptom she endorsed between hospitalizations is nocturnal phlegm.  We will clarify with GI timing of gastric emptying study as she is not able to tolerate water today.  Additionally, Reglan was started which would have to be held prior to the gastric emptying study.  Burns Spain, MD 07/17/2018, 11:30 AM

## 2018-07-18 LAB — CBC
HCT: 40.6 % (ref 36.0–46.0)
Hemoglobin: 14.2 g/dL (ref 12.0–15.0)
MCH: 30.3 pg (ref 26.0–34.0)
MCHC: 35 g/dL (ref 30.0–36.0)
MCV: 86.6 fL (ref 80.0–100.0)
Platelets: 277 10*3/uL (ref 150–400)
RBC: 4.69 MIL/uL (ref 3.87–5.11)
RDW: 12.8 % (ref 11.5–15.5)
WBC: 7.4 10*3/uL (ref 4.0–10.5)
nRBC: 0 % (ref 0.0–0.2)

## 2018-07-18 LAB — COMPREHENSIVE METABOLIC PANEL
ALT: 45 U/L — ABNORMAL HIGH (ref 0–44)
AST: 38 U/L (ref 15–41)
Albumin: 3.5 g/dL (ref 3.5–5.0)
Alkaline Phosphatase: 74 U/L (ref 38–126)
Anion gap: 11 (ref 5–15)
BUN: 11 mg/dL (ref 6–20)
CO2: 21 mmol/L — ABNORMAL LOW (ref 22–32)
Calcium: 8.6 mg/dL — ABNORMAL LOW (ref 8.9–10.3)
Chloride: 104 mmol/L (ref 98–111)
Creatinine, Ser: 0.7 mg/dL (ref 0.44–1.00)
GFR calc Af Amer: >60 mL/min (ref >60)
GFR calc non Af Amer: >60 mL/min (ref >60)
Glucose, Bld: 113 mg/dL — ABNORMAL HIGH (ref 70–99)
Potassium: 3.3 mmol/L — ABNORMAL LOW (ref 3.5–5.1)
Sodium: 136 mmol/L (ref 135–145)
Total Bilirubin: 2 mg/dL — ABNORMAL HIGH (ref 0.3–1.2)
Total Protein: 6.8 g/dL (ref 6.5–8.1)

## 2018-07-18 LAB — MAGNESIUM: Magnesium: 2.1 mg/dL (ref 1.7–2.4)

## 2018-07-18 LAB — PHOSPHORUS: Phosphorus: 3 mg/dL (ref 2.5–4.6)

## 2018-07-18 MED ORDER — DEXTROSE-NACL 5-0.45 % IV SOLN
INTRAVENOUS | Status: AC
  Administered 2018-07-18 (×2): via INTRAVENOUS

## 2018-07-18 MED ORDER — SODIUM CHLORIDE 0.9 % IV SOLN
8.0000 mg | Freq: Four times a day (QID) | INTRAVENOUS | Status: DC | PRN
  Administered 2018-07-19 – 2018-07-20 (×3): 8 mg via INTRAVENOUS
  Filled 2018-07-18 (×4): qty 4

## 2018-07-18 MED ORDER — PANTOPRAZOLE SODIUM 40 MG IV SOLR
40.0000 mg | Freq: Two times a day (BID) | INTRAVENOUS | Status: DC
  Administered 2018-07-18 – 2018-07-21 (×5): 40 mg via INTRAVENOUS
  Filled 2018-07-18 (×5): qty 40

## 2018-07-18 MED ORDER — POTASSIUM CHLORIDE 10 MEQ/100ML IV SOLN
10.0000 meq | INTRAVENOUS | Status: AC
  Administered 2018-07-18 (×4): 10 meq via INTRAVENOUS
  Filled 2018-07-18 (×4): qty 100

## 2018-07-18 NOTE — Progress Notes (Signed)
   Subjective:  Having significant nausea this morning. States she vomited last night around 2am. She has been drinking more water overnight. Denies abdominal pain, constipation, or changes in urination.   Objective:  Vital signs in last 24 hours: Vitals:   07/17/18 1344 07/17/18 2227 07/18/18 0513 07/18/18 0516  BP: (!) 145/71 (!) 160/87  (!) 141/79  Pulse: 74 71  73  Resp:      Temp: 98.6 F (37 C) 99 F (37.2 C)  98.9 F (37.2 C)  TempSrc: Oral Oral  Oral  SpO2: 100% 99%  100%  Weight:   87.6 kg   Height:       Constitution: appears uncomfortable, supine in bed Respiratory: non-labored breathing, on room air Abdominal: voluntary guarding, soft, NTTP, non-distended MSK: no edema, moving all extremities Skin: c/d/i   Assessment/Plan:  Active Problems:   Intractable nausea and vomiting  58yo female with PMH RA, chronic back pain on opioid therapy, and intermittent intractable nausea and vomiting occurring every several months, asymptomatic between episodes. Admitted for intractable n/v, continuing to have symptoms. GI consulted.   Intractable Nausea and Vomiting Continues to have nausea and vomiting. Differential includes CVS, gastroparesis, chronic opioid therapy. Previous workup benign including [place], CT scans, and MRCP with nonspecific biliary dilatation without clear etiology. Nausea is significantly worsened by even gentle abdominal exam. I do not think she will be able to complete gastric study this am. Will continue to work on helping her tolerate po with prn meds.   - wiill cancel gastric emptying study as she is still quite ill and not tolerating po fluids - cont. Phenergan prn, restart reglan - continue protonix 40 mg IV qd - will switch to po when able to tolerate - consider intranasal sumatriptan as abortive therapy at discharge  - will switch to d5 1/2 ns 100cc/hr - mildly hypokalemic- replete electrolytes  VTE: lovenox IVF: 100cc/hr d51/2ns Diet: NPO Code:  full   Dispo: Anticipated discharge when able to tolerate po.   Versie Starks, DO 07/18/2018, 6:34 AM Pager: [phone]

## 2018-07-18 NOTE — Progress Notes (Signed)
Subjective: The patient was seen and examined at bedside. Complains of nosebleeds, continued nausea, which wakes her up from sleep, 2 episodes of bilious vomiting with small streaks of blood today morning. Loose yellow stools reported today morning. Continues to have generalized mild abdominal pain.  Objective: Vital signs in last 24 hours: Temp:  [98.6 F (37 C)-99 F (37.2 C)] 98.9 F (37.2 C) (05/08 0516) Pulse Rate:  [71-74] 73 (05/08 0516) BP: (141-160)/(71-87) 141/79 (05/08 0516) SpO2:  [99 %-100 %] 100 % (05/08 0516) Weight:  [87.6 kg] 87.6 kg (05/08 0513) Weight change: -0.1 kg    PE: In mild distress from continued nausea GENERAL: No pallor ABDOMEN: Soft, mild generalized tenderness, more noted in left lower quadrant today, no active bowel sounds, nondistended EXTREMITIES: No deformity  Lab Results: Results for orders placed or performed during the hospital encounter of 07/15/18 (from the past 48 hour(s))  Comprehensive metabolic panel     Status: Abnormal   Collection Time: 07/17/18  4:07 AM  Result Value Ref Range   Sodium 138 135 - 145 mmol/L   Potassium 3.6 3.5 - 5.1 mmol/L   Chloride 102 98 - 111 mmol/L   CO2 22 22 - 32 mmol/L   Glucose, Bld 114 (H) 70 - 99 mg/dL   BUN 11 6 - 20 mg/dL   Creatinine, Ser 1.610.69 0.44 - 1.00 mg/dL   Calcium 8.8 (L) 8.9 - 10.3 mg/dL   Total Protein 7.3 6.5 - 8.1 g/dL   Albumin 3.6 3.5 - 5.0 g/dL   AST 30 15 - 41 U/L   ALT 26 0 - 44 U/L   Alkaline Phosphatase 82 38 - 126 U/L   Total Bilirubin 1.2 0.3 - 1.2 mg/dL   GFR calc non Af Amer >60 >60 mL/min   GFR calc Af Amer >60 >60 mL/min   Anion gap 14 5 - 15    Comment: Performed at [facility], [address] [place], [place] [address]  Magnesium     Status: None   Collection Time: 07/17/18  4:07 AM  Result Value Ref Range   Magnesium 2.2 1.7 - 2.4 mg/dL    Comment: Performed at [facility], [address] [place], [place] [address]  Comprehensive metabolic panel      Status: Abnormal   Collection Time: 07/18/18  4:36 AM  Result Value Ref Range   Sodium 136 135 - 145 mmol/L   Potassium 3.3 (L) 3.5 - 5.1 mmol/L   Chloride 104 98 - 111 mmol/L   CO2 21 (L) 22 - 32 mmol/L   Glucose, Bld 113 (H) 70 - 99 mg/dL   BUN 11 6 - 20 mg/dL   Creatinine, Ser 1.190.70 0.44 - 1.00 mg/dL   Calcium 8.6 (L) 8.9 - 10.3 mg/dL   Total Protein 6.8 6.5 - 8.1 g/dL   Albumin 3.5 3.5 - 5.0 g/dL   AST 38 15 - 41 U/L   ALT 45 (H) 0 - 44 U/L   Alkaline Phosphatase 74 38 - 126 U/L   Total Bilirubin 2.0 (H) 0.3 - 1.2 mg/dL   GFR calc non Af Amer >60 >60 mL/min   GFR calc Af Amer >60 >60 mL/min   Anion gap 11 5 - 15    Comment: Performed at [facility], [address] [place], [place] [address]  Magnesium     Status: None   Collection Time: 07/18/18  4:36 AM  Result Value Ref Range  Magnesium 2.1 1.7 - 2.4 mg/dL    Comment: Performed at [facility], [address], [place], [place] [address]  Phosphorus     Status: None   Collection Time: 07/18/18  4:36 AM  Result Value Ref Range   Phosphorus 3.0 2.5 - 4.6 mg/dL    Comment: Performed at [facility], [address], [place], [place] [address]  CBC     Status: None   Collection Time: 07/18/18  4:36 AM  Result Value Ref Range   WBC 7.4 4.0 - 10.5 K/uL   RBC 4.69 3.87 - 5.11 MIL/uL   Hemoglobin 14.2 12.0 - 15.0 g/dL   HCT 46.2 70.3 - 50.0 %   MCV 86.6 80.0 - 100.0 fL   MCH 30.3 26.0 - 34.0 pg   MCHC 35.0 30.0 - 36.0 g/dL   RDW 93.8 18.2 - 99.3 %   Platelets 277 150 - 400 K/uL   nRBC 0.0 0.0 - 0.2 %    Comment: Performed at [facility], [address], [place], [place] [address]    Studies/Results: No results found.  Medications: I have reviewed the patient's current medications.  Assessment: Intractable nausea, improvement in frequency of vomiting, unable to undergo gastric emptying scan today Hypokalemia Epistaxis-could be related to intermittent but frequent  retching  Plan: Patient wants to try clear liquid diet such as ginger ale Continue IV fluids, has been started on D5 half-normal saline at 100 cc an hour Replace potassium and magnesium as needed Has been taken off Reglan, continued on Phenergan 12.5 mg every 6 hours as needed, will start Zofran 8 mg IV every 8 hours as needed. Gastric emptying scan to be performed when patient clinically able to take eggs/pudding with radionucleotide needed for the study.   Kerin Salen 07/18/2018, 11:06 AM   Pager [phone] If no answer or after 5 PM call [phone]

## 2018-07-18 NOTE — Progress Notes (Signed)
  Date: 07/18/2018  Patient name: Kristen Ramos  Medical record number: [id number]  Date of birth: 05/14/1960   I have seen and evaluated this patient and I have discussed the plan of care with the house staff. Please see their note for complete details. I concur with their findings with the following additions/corrections: Ms. Goelz was seen this morning on team rounds.  Her last episode of vomiting was around 2 AM.  She has only been able to drink water.  Dr. Cleaster Corin discussed the gastric emptying study with the patient and we decided to cancel the test as we are concerned eating eggs will set back the progress she has made in her nausea and vomiting severity and frequency.  Continue symptomatic treatment with potassium supplementation.  Burns Spain, MD 07/18/2018, 9:22 AM

## 2018-07-19 LAB — BASIC METABOLIC PANEL
Anion gap: 12 (ref 5–15)
BUN: 10 mg/dL (ref 6–20)
CO2: 23 mmol/L (ref 22–32)
Calcium: 8.6 mg/dL — ABNORMAL LOW (ref 8.9–10.3)
Chloride: 104 mmol/L (ref 98–111)
Creatinine, Ser: 0.66 mg/dL (ref 0.44–1.00)
GFR calc Af Amer: >60 mL/min (ref >60)
GFR calc non Af Amer: >60 mL/min (ref >60)
Glucose, Bld: 110 mg/dL — ABNORMAL HIGH (ref 70–99)
Potassium: 3.3 mmol/L — ABNORMAL LOW (ref 3.5–5.1)
Sodium: 139 mmol/L (ref 135–145)

## 2018-07-19 MED ORDER — KETOROLAC TROMETHAMINE 15 MG/ML IJ SOLN
15.0000 mg | Freq: Once | INTRAMUSCULAR | Status: AC
  Administered 2018-07-19: 15 mg via INTRAVENOUS
  Filled 2018-07-19: qty 1

## 2018-07-19 MED ORDER — POTASSIUM CHLORIDE 10 MEQ/100ML IV SOLN: 10.0000 meq | INTRAVENOUS | Status: DC

## 2018-07-19 MED ORDER — POTASSIUM CHLORIDE 10 MEQ/100ML IV SOLN
10.0000 meq | INTRAVENOUS | Status: AC
  Administered 2018-07-19 (×2): 10 meq via INTRAVENOUS
  Filled 2018-07-19 (×2): qty 100

## 2018-07-19 MED ORDER — KCL IN DEXTROSE-NACL 40-5-0.45 MEQ/L-%-% IV SOLN
INTRAVENOUS | Status: DC
  Administered 2018-07-19 – 2018-07-20 (×2): via INTRAVENOUS
  Filled 2018-07-19 (×4): qty 1000

## 2018-07-19 NOTE — Plan of Care (Signed)
  Problem: Education: Goal: Knowledge of General Education information will improve Description: Including pain rating scale, medication(s)/side effects and non-pharmacologic comfort measures Outcome: Progressing   Problem: Health Behavior/Discharge Planning: Goal: Ability to manage health-related needs will improve Outcome: Progressing   Problem: Clinical Measurements: Goal: Ability to maintain clinical measurements within normal limits will improve Outcome: Progressing Goal: Will remain free from infection Outcome: Progressing Goal: Diagnostic test results will improve Outcome: Progressing Goal: Respiratory complications will improve Outcome: Progressing Goal: Cardiovascular complication will be avoided Outcome: Progressing   Problem: Activity: Goal: Risk for activity intolerance will decrease Outcome: Progressing   Problem: Nutrition: Goal: Adequate nutrition will be maintained Outcome: Progressing   Problem: Pain Managment: Goal: General experience of comfort will improve Outcome: Progressing   Problem: Safety: Goal: Ability to remain free from injury will improve Outcome: Progressing   

## 2018-07-19 NOTE — Progress Notes (Signed)
   Subjective:  She continues to feel nauseous, last vomited around 4am. She continues to have some abdominal pain. She is not hungry but would like to try and eat. She states she had three bowel movements in the last 24 hours that have been loose.   Objective:  Vital signs in last 24 hours: Vitals:   07/18/18 0513 07/18/18 0516 07/18/18 1429 07/18/18 2316  BP:  (!) 141/79 (!) 176/89 132/76  Pulse:  73 70 70  Resp:    17  Temp:  98.9 F (37.2 C) 99.1 F (37.3 C) 99.6 F (37.6 C)  TempSrc:  Oral Oral Oral  SpO2:  100% 99% 98%  Weight: 87.6 kg     Height:       Constitution: appears uncomfortable, supine in bed Cardio: RRR, no m/r/g  Respiratory: non-labored breathing, on RA Abdominal: +BS, diffusely TTP, voluntary guarding MSK: no le edema, moving all extremities    Assessment/Plan:  Active Problems:   Intractable nausea and vomiting  58yo female with PMH RA, chronic back pain on opioid therapy, and intermittent intractable nausea and vomiting occurring every several months, asymptomatic between episodes. Admitted for intractable n/v, continuing to have symptoms. GI consulted.  Intractable Nausea and Vomiting Continues to have symptoms of nausea although she is wanting to try CLD to see if she can tolerate this.   - cont. CLD - ordered yesterday but she has not yet tried anything besides water, discussed with nursing - advance diet as tolerated  - cont. Phenergan prn, zofran prn  - continue protonix 40 mg IV qd - BMP pending today, will f/u  - continue D5 1/2 NS 100 cc/hr - replete electrolytes as needed  - GES when able or in the outpatient setting pending patient improvement  VTE: lovenox  IVF: D5 1/2 NS 100 cc/hr  Diet: CLD  Code: full   Dispo: Anticipated discharge pending improvement in n/v and able to tolerate po.   Djeneba Barsch A, DO 07/19/2018, 6:00 AM Pager: [phone]

## 2018-07-19 NOTE — Progress Notes (Signed)
Subjective: Patient states that her last episode of vomiting was yesterday morning, is only spitting up some sputum and mucus reports being less nauseous. She has had 3 loose yellow stools in the last 24 hours. She reports improvement in abdominal pain and is requesting for ginger ale or liquids.  Objective: Vital signs in last 24 hours: Temp:  [98.9 F (37.2 C)-99.6 F (37.6 C)] 98.9 F (37.2 C) (05/09 0643) Pulse Rate:  [69-70] 69 (05/09 0643) Resp:  [17] 17 (05/09 0643) BP: (99-176)/(50-89) 99/50 (05/09 0643) SpO2:  [98 %-99 %] 98 % (05/09 0643) Weight:  [89.5 kg] 89.5 kg (05/09 0643) Weight change: 1.9 kg    PE: No pallor, no icterus GENERAL: Moist oral mucosa ABDOMEN: Soft, nondistended EXTREMITIES: No deformity  Lab Results: Results for orders placed or performed during the hospital encounter of 07/15/18 (from the past 48 hour(s))  Comprehensive metabolic panel     Status: Abnormal   Collection Time: 07/18/18  4:36 AM  Result Value Ref Range   Sodium 136 135 - 145 mmol/L   Potassium 3.3 (L) 3.5 - 5.1 mmol/L   Chloride 104 98 - 111 mmol/L   CO2 21 (L) 22 - 32 mmol/L   Glucose, Bld 113 (H) 70 - 99 mg/dL   BUN 11 6 - 20 mg/dL   Creatinine, Ser 4.68 0.44 - 1.00 mg/dL   Calcium 8.6 (L) 8.9 - 10.3 mg/dL   Total Protein 6.8 6.5 - 8.1 g/dL   Albumin 3.5 3.5 - 5.0 g/dL   AST 38 15 - 41 U/L   ALT 45 (H) 0 - 44 U/L   Alkaline Phosphatase 74 38 - 126 U/L   Total Bilirubin 2.0 (H) 0.3 - 1.2 mg/dL   GFR calc non Af Amer >60 >60 mL/min   GFR calc Af Amer >60 >60 mL/min   Anion gap 11 5 - 15    Comment: Performed at [facility], [address] [place], [place] [address]  Magnesium     Status: None   Collection Time: 07/18/18  4:36 AM  Result Value Ref Range   Magnesium 2.1 1.7 - 2.4 mg/dL    Comment: Performed at [facility], [address], [place], [place] [address]  Phosphorus     Status: None   Collection Time: 07/18/18  4:36 AM  Result Value Ref Range    Phosphorus 3.0 2.5 - 4.6 mg/dL    Comment: Performed at [facility], [address] [place], [place] [address]  CBC     Status: None   Collection Time: 07/18/18  4:36 AM  Result Value Ref Range   WBC 7.4 4.0 - 10.5 K/uL   RBC 4.69 3.87 - 5.11 MIL/uL   Hemoglobin 14.2 12.0 - 15.0 g/dL   HCT 48.8 89.1 - 69.4 %   MCV 86.6 80.0 - 100.0 fL   MCH 30.3 26.0 - 34.0 pg   MCHC 35.0 30.0 - 36.0 g/dL   RDW 50.3 88.8 - 28.0 %   Platelets 277 150 - 400 K/uL   nRBC 0.0 0.0 - 0.2 %    Comment: Performed at [facility], [address] [place], [place] [address]    Studies/Results: No results found.  Medications: I have reviewed the patient's current medications.  Assessment: Cyclical episodes of intractable nausea and vomiting of unclear etiology, possible gastroparesis, possibly worsened by chronic opioid use Seems to be improving Mild hypokalemia  Plan: Clear liquid diet, patient requesting  for ginger ale Patient on Zofran 8 mg every 6 hours as needed nausea/vomiting and Phenergan 12.5 mg IV every 6 hours as needed nausea vomiting, seems to be doing better. If she is able to advance clear liquid diet without further episodes of vomiting, plan to perform a gastric emptying scan thereafter.   Kerin Salen 07/19/2018, 10:21 AM   Pager [phone] If no answer or after 5 PM call [phone]

## 2018-07-20 LAB — BASIC METABOLIC PANEL
Anion gap: 12 (ref 5–15)
BUN: 7 mg/dL (ref 6–20)
CO2: 22 mmol/L (ref 22–32)
Calcium: 8.6 mg/dL — ABNORMAL LOW (ref 8.9–10.3)
Chloride: 105 mmol/L (ref 98–111)
Creatinine, Ser: 0.81 mg/dL (ref 0.44–1.00)
GFR calc Af Amer: >60 mL/min (ref >60)
GFR calc non Af Amer: >60 mL/min (ref >60)
Glucose, Bld: 103 mg/dL — ABNORMAL HIGH (ref 70–99)
Potassium: 3.7 mmol/L (ref 3.5–5.1)
Sodium: 139 mmol/L (ref 135–145)

## 2018-07-20 LAB — C DIFFICILE QUICK SCREEN W PCR REFLEX
C Diff antigen: NEGATIVE
C Diff interpretation: NOT DETECTED
C Diff toxin: NEGATIVE

## 2018-07-20 MED ORDER — HYDROCORTISONE (PERIANAL) 2.5 % EX CREA
TOPICAL_CREAM | Freq: Two times a day (BID) | CUTANEOUS | Status: DC
  Filled 2018-07-20: qty 28.35

## 2018-07-20 MED ORDER — ZINC OXIDE 40 % EX OINT
TOPICAL_OINTMENT | Freq: Four times a day (QID) | CUTANEOUS | Status: DC
  Administered 2018-07-20 – 2018-07-21 (×4): via TOPICAL
  Filled 2018-07-20: qty 57

## 2018-07-20 MED ORDER — LOPERAMIDE HCL 2 MG PO CAPS
4.0000 mg | ORAL_CAPSULE | Freq: Once | ORAL | Status: AC
  Administered 2018-07-20: 4 mg via ORAL
  Filled 2018-07-20: qty 2

## 2018-07-20 MED ORDER — ONDANSETRON HCL 4 MG/2ML IJ SOLN: 4.0000 mg | Freq: Three times a day (TID) | INTRAMUSCULAR | Status: DC | PRN

## 2018-07-20 MED ORDER — RAMELTEON 8 MG PO TABS
8.0000 mg | ORAL_TABLET | Freq: Every day | ORAL | Status: DC
  Administered 2018-07-20 (×2): 8 mg via ORAL
  Filled 2018-07-20 (×3): qty 1

## 2018-07-20 MED ORDER — KETOROLAC TROMETHAMINE 15 MG/ML IJ SOLN
15.0000 mg | Freq: Once | INTRAMUSCULAR | Status: AC
  Administered 2018-07-20: 15 mg via INTRAVENOUS
  Filled 2018-07-20: qty 1

## 2018-07-20 NOTE — Progress Notes (Signed)
Subjective: Patient was seen and examined at bedside. Complains of multiple episodes of loose, watery, yellow stools, almost every hour since yesterday evening, 2 episodes overnight and at least 5 of such episodes today morning. She complains of pain and skin tear in perianal area from multiple bowel movements and excessive wiping. Her nausea seems to be better controlled, was able to tolerate clear fluids such as ginger ale Jell-O and water.  Objective: Vital signs in last 24 hours: Temp:  [98.3 F (36.8 C)-99 F (37.2 C)] 98.3 F (36.8 C) (05/10 0639) Pulse Rate:  [64-91] 91 (05/10 0639) Resp:  [16] 16 (05/10 0639) BP: (122-132)/(73-78) 122/76 (05/10 0639) SpO2:  [97 %-100 %] 100 % (05/10 0639) Weight:  [85 kg] 85 kg (05/10 8850) Weight change: -4.5 kg Last BM Date: 07/18/18  PE: Moist oral mucosa GENERAL: Pallor, no icterus ABDOMEN: Nondistended EXTREMITIES:no  Deformity  Lab Results: Results for orders placed or performed during the hospital encounter of 07/15/18 (from the past 48 hour(s))  Basic metabolic panel     Status: Abnormal   Collection Time: 07/19/18  9:43 AM  Result Value Ref Range   Sodium 139 135 - 145 mmol/L   Potassium 3.3 (L) 3.5 - 5.1 mmol/L   Chloride 104 98 - 111 mmol/L   CO2 23 22 - 32 mmol/L   Glucose, Bld 110 (H) 70 - 99 mg/dL   BUN 10 6 - 20 mg/dL   Creatinine, Ser 2.77 0.44 - 1.00 mg/dL   Calcium 8.6 (L) 8.9 - 10.3 mg/dL   GFR calc non Af Amer >60 >60 mL/min   GFR calc Af Amer >60 >60 mL/min   Anion gap 12 5 - 15    Comment: Performed at [facility], [address], [place], [place] [address]  Basic metabolic panel     Status: Abnormal   Collection Time: 07/20/18  6:41 AM  Result Value Ref Range   Sodium 139 135 - 145 mmol/L   Potassium 3.7 3.5 - 5.1 mmol/L   Chloride 105 98 - 111 mmol/L   CO2 22 22 - 32 mmol/L   Glucose, Bld 103 (H) 70 - 99 mg/dL   BUN 7 6 - 20 mg/dL   Creatinine, Ser 8.67 0.44 - 1.00 mg/dL   Calcium 8.6 (L) 8.9  - 10.3 mg/dL   GFR calc non Af Amer >60 >60 mL/min   GFR calc Af Amer >60 >60 mL/min   Anion gap 12 5 - 15    Comment: Performed at [facility], [address] [place], [place] [address]    Studies/Results: No results found.  Medications: I have reviewed the patient's current medications.  Assessment: Cyclical episodes of intractable nausea and vomiting-seems to be resolving New onset of diarrhea, r/o infectious etiology, particularly C diff Perianal tenderness excessive wiping and diarrhea  Plan: Advance diet to regular, change Zofran to 4 mg IV every 8 hours as needed. We will send stool study for C diff and GI pathogen panel.Enteric precautions to be maintained meanwhile. DC hydrocortisone cream Recommended zinc oxide 40% ointment to be applied to perianal area up to 4 times a day Gastric emptying scan ordered for tomorrow a.m.   Kerin Salen 07/20/2018, 11:23 AM   Pager [phone] If no answer or after 5 PM call [phone]

## 2018-07-20 NOTE — Plan of Care (Signed)
  Problem: Education: Goal: Knowledge of General Education information will improve Description Including pain rating scale, medication(s)/side effects and non-pharmacologic comfort measures Outcome: Not Met (add Reason)   Problem: Health Behavior/Discharge Planning: Goal: Ability to manage health-related needs will improve Outcome: Not Met (add Reason)   Problem: Clinical Measurements: Goal: Ability to maintain clinical measurements within normal limits will improve Outcome: Not Met (add Reason) Goal: Will remain free from infection Outcome: Not Met (add Reason) Goal: Diagnostic test results will improve Outcome: Not Met (add Reason) Goal: Respiratory complications will improve Outcome: Not Met (add Reason) Goal: Cardiovascular complication will be avoided Outcome: Not Met (add Reason)   Problem: Activity: Goal: Risk for activity intolerance will decrease Outcome: Not Met (add Reason)   Problem: Nutrition: Goal: Adequate nutrition will be maintained Outcome: Not Met (add Reason)   Problem: Pain Managment: Goal: General experience of comfort will improve Outcome: Not Met (add Reason)   Problem: Safety: Goal: Ability to remain free from injury will improve Outcome: Not Met (add Reason)

## 2018-07-20 NOTE — Progress Notes (Signed)
   Subjective: No acute events overnight. Remains nauseous and had one episode of NBNB emesis overnight. Able to tolerate CL diet though unable to finish whole meal. Continues to report diarrhea, had 3 episodes yesterday.   Objective:  Vital signs in last 24 hours: Vitals:   07/19/18 1319 07/19/18 2232 07/20/18 0632 07/20/18 0639  BP: 132/73 124/78  122/76  Pulse: 64 73  91  Resp: 16 16  16   Temp: 98.4 F (36.9 C) 99 F (37.2 C)  98.3 F (36.8 C)  TempSrc: Oral Oral  Oral  SpO2: 98% 97%  100%  Weight:   85 kg   Height:       General: well-developed, well-nourished female, in bed in no acute distress CV: RRR, nl S1/S2, no mrg  Pulm: comfortable on RA Abd: soft, LLQ tenderness normoactive bowel sounds Ext: warm and well perfues w/o edema    Assessment/Plan:  Active Problems:   Intractable nausea and vomiting  # Intractable nausea and vomiting: secondary to cyclical vomiting syndrome vs opioid-induced vs gastroparesis. Slowly improving. Now tolerating some PO intake.  - GI following, appreciate recommendations - Plan for GES once able to tolerate PO intake, hopefully tomorrow  - Holding home opiates  - Continue anti-emetics: Phenergan and Zofran - Continue PPI  - D5 1/2 NS @ 75cc/hr  - Monitoring lytes   # Diarrhea: Having several episodes per day. No blood. C. Diff risk factors include current admission and PPI use. No recent antibiotic use.  - Immodium 4mg  x1 - Low suspicion for C diff ,consider testing if no improvement   # HLD:  - D/c home atorvastatin. Patient declined last night and stated she does not take it at home    Dispo: Anticipated discharge in approximately 1-2 day(s) after resolution of N/V and completion of GES.   Burna Cash, MD 07/20/2018, 7:09 AM Pager: [phone]

## 2018-07-21 ENCOUNTER — Inpatient Hospital Stay (HOSPITAL_COMMUNITY): Payer: Medicare Other

## 2018-07-21 LAB — GASTROINTESTINAL PANEL BY PCR, STOOL (REPLACES STOOL CULTURE)

## 2018-07-21 LAB — BASIC METABOLIC PANEL
Anion gap: 9 (ref 5–15)
BUN: <5 mg/dL — ABNORMAL LOW (ref 6–20)
CO2: 25 mmol/L (ref 22–32)
Calcium: 8.4 mg/dL — ABNORMAL LOW (ref 8.9–10.3)
Chloride: 107 mmol/L (ref 98–111)
Creatinine, Ser: 0.69 mg/dL (ref 0.44–1.00)
GFR calc Af Amer: >60 mL/min (ref >60)
GFR calc non Af Amer: >60 mL/min (ref >60)
Glucose, Bld: 90 mg/dL (ref 70–99)
Potassium: 3.9 mmol/L (ref 3.5–5.1)
Sodium: 141 mmol/L (ref 135–145)

## 2018-07-21 LAB — MAGNESIUM: Magnesium: 2 mg/dL (ref 1.7–2.4)

## 2018-07-21 MED ORDER — ONDANSETRON 4 MG PO TBDP: 4.0000 mg | ORAL_TABLET | Freq: Three times a day (TID) | ORAL | 0 refills | Status: DC | PRN

## 2018-07-21 MED ORDER — TECHNETIUM TC 99M SULFUR COLLOID
2.0000 | Freq: Once | INTRAVENOUS | Status: AC | PRN
  Administered 2018-07-21: 2 via ORAL

## 2018-07-21 MED ORDER — PANTOPRAZOLE SODIUM 40 MG PO TBEC: 40.0000 mg | DELAYED_RELEASE_TABLET | Freq: Every day | ORAL | 2 refills | Status: DC

## 2018-07-21 NOTE — TOC Transition Note (Addendum)
Transition of Care [facility]) - CM/SW Discharge Note   Patient Details  Name: Kristen Ramos MRN: [id number] Date of Birth: [birth year]  Transition [facility]) CM/SW Contact:  Epifanio Lesches, RN Phone Number: 07/21/2018, 1:21 PM   Clinical Narrative:    Pt to transition to home with self care today. Pt states will f/u with PCP once d/c and obtain f/u appointment.  Pt states has transportation to home and can afford Rx meds.  Final next level of care: Home/Self Care Barriers to Discharge: Barriers Resolved   Patient Goals and CMS Choice Patient states their goals for this hospitalization and ongoing recovery are:: to feel better CMS Medicare.gov Compare Post Acute Care list provided to:: Patient Choice offered to / list presented to : NA  Discharge Placement   Discharge Plan and Services In-house Referral: NA Discharge Planning Services: CM Consult Post Acute Care Choice: NA          DME Arranged: N/A DME Agency: NA                  Social Determinants of Health (SDOH) Interventions     Readmission Risk Interventions No flowsheet data found.

## 2018-07-21 NOTE — Progress Notes (Signed)
  Date: 07/21/2018  Patient name: Kristen Ramos  Medical record number: [id number]  Date of birth: [birth year]   I have seen and evaluated this patient and I have discussed the plan of care with the house staff. Please see their note for complete details. I concur with their findings with the following additions/corrections: [name] was seen this morning on team rounds.  She has completed her gastric emptying study which showed near complete emptying from the stomach after 1 hour.  She has no more vomiting, nausea, or diarrhea.  She is stable to be discharged home and follow-up with her PCP.  Burns Spain, MD 07/21/2018, 12:19 PM

## 2018-07-21 NOTE — Discharge Summary (Signed)
Name: Kristen Ramos MRN: [id number] DOB: [birth year] 58 y.o. PCP: Casimer Lanius, MD  Date of Admission: 07/15/2018  3:19 PM Date of Discharge: 07/21/2018 Attending Physician: Burns Spain, MD  Discharge Diagnosis: 1. Intractable Nausea and Vomiting  2. Rheumatoid Arthritis   Discharge Medications: Allergies as of 07/21/2018      Reactions   Bee Venom Swelling      Medication List    STOP taking these medications   polyethylene glycol 17 g packet Commonly known as:  MIRALAX / GLYCOLAX     TAKE these medications   ondansetron 4 MG disintegrating tablet Commonly known as:  ZOFRAN-ODT Take 1 tablet (4 mg total) by mouth every 8 (eight) hours as needed for nausea or vomiting.   oxyCODONE 15 MG immediate release tablet Commonly known as:  ROXICODONE Take 15 mg by mouth 4 (four) times daily as needed for pain.   pantoprazole 40 MG tablet Commonly known as:  PROTONIX Take 1 tablet (40 mg total) by mouth daily.   tiZANidine 4 MG capsule Commonly known as:  ZANAFLEX Take 4 mg by mouth 2 (two) times daily as needed for muscle spasms.       Disposition and follow-up:   [name] was discharged from [facility] in Good condition.  At the hospital follow up visit please address:  1.  Intractable Nausea and Vomiting: resolved with symptomatic treatment over several days, similar to previous admissions. Previous workup unremarkable and recurrence of symptoms are consistent with cyclical vomiting syndrome as she is asymptomatic for several months with sudden onset of symptoms and resolution after symptomatic treatment. Had been off PPI. Missed GI appointment due to admission but seen by GI inpatient. Gastric emptying study unremarkable. Protonix 40 mg qd continued at discharge and f/u scheduled with GI.  2. Rheumatoid Arthritis: currently untreated as she was unable to afford biologics. Currently on opioid medication for her chronic RA pain and back  pain with previous lumbar surgery. Discharged with recommended follow-up with her PCP.   2.  Labs / imaging needed at time of follow-up: none  3.  Pending labs/ test needing follow-up: Gastrointestinal panel   Follow-up Appointments: Follow-up Information    [facility].   Specialty:  Rheumatology Contact information: [address] [place] [address] [phone]           Hospital Course by problem list: 1. Intractable Nausea and Vomiting Kristen Ramos is a 58yo female with PMH rheumatoid arthritis, chronic back pain on opioids, with prior admissions for intractable nausea and vomiting who presented with intractable nausea, vomiting, and abdominal pain. Abdominal radiographs were done which showed no acute findings, labs and vitals were within normal limits. Symptoms did not improve in the ED with symptomatic treatment, and she was admitted to the IMTS service. She is following in the outpatient setting with gastroenterology for recurrence of vomiting episodes every few months, asymptomatic in between episodes. GI was consulted in the ED. She additionally had prolonged QT which resolved on day 2 after decreasing nausea medications.  She was treated symptomatically with zofran, reglan, and metoclopramide and was fluid resuscitated with D5 1/2NS. GI recommended gastric emptying study once nausea and vomiting resolved. She progressed slowly to a clear liquid diet over several days and then a regular diet. Gastric emptying study was done and was unremarkable. She has had multiple episodes over the past several years with acute onset of nausea and vomiting with resolution of symptoms with symptomatic treatment.  Previous MRCP showed dilatation of the common bile duct and intrahepatic duct dilatation without identifiable etiology. Overall her symptoms are consistent with cyclical vomiting syndrome. She had previously been prescribed protonix 40 mg qd which she  had run out of prior to admission. This was continued at discharge along with zofran prn. Symptoms not associated with migraine. She has scheduled follow-up with GI in the outpatient setting for further work-up and recommended follow-up with PCP and may benefit from abortive therapy with sumatriptan.   Discharge Vitals:   BP (!) 102/56 (BP Location: Right Arm)   Pulse 66   Temp 97.6 F (36.4 C) (Oral)   Resp 18   Ht 5\' 6"  (1.676 m)   Wt 89.6 kg   SpO2 98%   BMI 31.88 kg/m   Pertinent Labs, Studies, and Procedures:   Gastric Emptying Study 07/21/2018 IMPRESSION: Near complete emptying from the stomach after 1 hour. There is no evidence suggesting gastric paresis. The clinical significance of this rather rapid emptying of food material from the stomach is uncertain. Electronically Signed   By: Bretta Bang III M.D.   On: 07/21/2018 10:53  07/15/2018 IMPRESSION: Negative abdominal radiographs.  No acute cardiopulmonary disease. Electronically Signed   By: Katherine Mantle M.D.   On: 07/15/2018 18:24  Discharge Instructions: Discharge Instructions    Diet - low sodium heart healthy   Complete by:  As directed    Discharge instructions   Complete by:  As directed    You were hospitalized for nausea and vomiting. Thank you for allowing [place] to be part of your care.   Please call to schedule a follow-up appointment with your gastroenterologist:  Dr. Kerin Salen, MD [facility] [phone] [address]  [place], [place] [address]  Please follow-up with your rheumatologist at your earlier convenience.   Please note these changes made to your medications:   Please continue taking pantoprazole (PROTONIX) 40 MG tablet once daily in the morning  Please take ondansetron (ZOFRAN) 4 MG disintegrating tablet every 8 hours as needed for nausea and vomiting.   Increase activity slowly   Complete by:  As directed       Signed: Guinevere Scarlet A, DO  07/21/2018, 12:19 PM   Pager: [phone]

## 2018-07-21 NOTE — Progress Notes (Signed)
   Subjective:  Kristen Ramos was seen after completed her gastric emptying study. She states she is feeling much better. She denies nausea, vomiting. She does have some mild left lower abdominal pain but states this is only when she pushes. Her diarrhea has also resolved now that she has had a regular diet.   Objective:  Vital signs in last 24 hours: Vitals:   07/20/18 1601 07/20/18 2205 07/21/18 0601 07/21/18 0602  BP: 102/69 (!) 109/57  (!) 102/56  Pulse: 79 78  66  Resp: 14 17  18   Temp: 98.6 F (37 C) 98.7 F (37.1 C)  97.6 F (36.4 C)  TempSrc: Oral Oral  Oral  SpO2: 99% 97%  98%  Weight:   89.6 kg   Height:       Constitution: NAD, supine in bed Cardio: rrr, no m/r/g  Respiratory: non-labored breathing, on RA Abdominal: mildly TTP LLQ, non-distended, soft, +BS MSK: no edema, moving all extremities   Assessment/Plan:  Active Problems:   Intractable nausea and vomiting  58yo female with PMH RA, chronic back pain on opioid therapy, and intermittent intractable nausea and vomiting occurring every several months, asymptomatic between episodes. Admitted for intractable n/v, continuing to have symptoms. GI consulted.  Intractable Nausea and Vomiting Advanced to regular diet yesterday and is tolerating this well. Completed gastric emptying study this morning and is stable for discharge with close follow-up with gastroenterology.   - GI following, appreciate recommendations - Gastric emptying study today, f/u results  - continue PPI  - Zofran prn  - fluids stopped   Diarrhea Diarrhea has resolved with immodium and resumption of regular diet. C. Diff negative. Gastrointestinal panel is pending.   - f/u GI panel   VTE: lovenox  IVF: none Diet: regular  Code: full   Dispo: Anticipated discharge today.   Versie Starks, DO 07/21/2018, 6:56 AM Pager: [phone]

## 2018-07-21 NOTE — Progress Notes (Signed)
Patient discharge instruction given and time provided for questions. Patient demonstrated understanding using teach back. Patient skin intact and IV site clean and intact. Catheter intact. Patient have all belongings.

## 2018-07-21 NOTE — Progress Notes (Signed)
Patient's hospital chart reviewed and case discussed with my partner Dr. Marca Ancona and case discussed with her nurse because she is currently in nuclear medicine having her gastric emptying study and patient has had no diarrhea or vomiting or nausea or pain today and will await gastric emptying study results but if okay no more diarrhea and probably go home and follow-up with Dr. Marca Ancona in the office in a few weeks

## 2018-07-21 NOTE — Discharge Instructions (Signed)
Nausea, Adult  Nausea is the feeling that you have an upset stomach or that you are about to vomit. Nausea on its own is not usually a serious concern, but it may be an early sign of a more serious medical problem. As nausea gets worse, it can lead to vomiting. If vomiting develops, or if you are not able to drink enough fluids, you are at risk of becoming dehydrated. Dehydration can make you tired and thirsty, cause you to have a dry mouth, and decrease how often you urinate. Older adults and people with other diseases or a weak disease-fighting system (immune system) are at higher risk for dehydration. The main goals of treating your nausea are:   To relieve your nausea.   To limit repeated nausea episodes.   To prevent vomiting and dehydration.  Follow these instructions at home:  Watch your symptoms for any changes. Tell your health care provider about them. Follow these instructions as told by your health care provider.  Eating and drinking          Take an oral rehydration solution (ORS). This is a drink that is sold at pharmacies and retail stores.   Drink clear fluids slowly and in small amounts as you are able. Clear fluids include water, ice chips, low-calorie sports drinks, and fruit juice that has water added (diluted fruit juice).   Eat bland, easy-to-digest foods in small amounts as you are able. These foods include bananas, applesauce, rice, lean meats, toast, and crackers.   Avoid drinking fluids that contain a lot of sugar or caffeine, such as energy drinks, sports drinks, and soda.   Avoid alcohol.   Avoid spicy or fatty foods.  General instructions   Take over-the-counter and prescription medicines only as told by your health care provider.   Rest at home while you recover.   Drink enough fluid to keep your urine pale yellow.   Breathe slowly and deeply when you feel nauseous.   Avoid smelling things that have strong odors.   Wash your hands often using soap and water. If soap and  water are not available, use hand sanitizer.   Make sure that all people in your household wash their hands well and often.   Keep all follow-up visits as told by your health care provider. This is important.  Contact a health care provider if:   Your nausea gets worse.   Your nausea does not go away after two days.   You vomit.   You cannot drink fluids without vomiting.   You have any of the following:  ? New symptoms.  ? A fever.  ? A headache.  ? Muscle cramps.  ? A rash.  ? Pain while urinating.   You feel light-headed or dizzy.  Get help right away if:   You have pain in your chest, neck, arm, or jaw.   You feel extremely weak or you faint.   You have vomit that is bright red or looks like coffee grounds.   You have bloody or black stools or stools that look like tar.   You have a severe headache, a stiff neck, or both.   You have severe pain, cramping, or bloating in your abdomen.   You have difficulty breathing or are breathing very quickly.   Your heart is beating very quickly.   Your skin feels cold and clammy.   You feel confused.   You have signs of dehydration, such as:  ? Dark urine, very   little urine, or no urine.  ? Cracked lips.  ? Dry mouth.  ? Sunken eyes.  ? Sleepiness.  ? Weakness.  These symptoms may represent a serious problem that is an emergency. Do not wait to see if the symptoms will go away. Get medical help right away. Call your local emergency services (911 in the [place] Do not drive yourself to the hospital.  Summary   Nausea is the feeling that you have an upset stomach or that you are about to vomit. Nausea on its own is not usually a serious concern, but it may be an early sign of a more serious medical problem.   If vomiting develops, or if you are not able to drink enough fluids, you are at risk of becoming dehydrated.   Follow recommendations for eating and drinking and take over-the-counter and prescription medicines only as told by your health care  provider.   Contact a health care provider right away if your symptoms worsen or you have new symptoms.   Keep all follow-up visits as told by your health care provider. This is important.  This information is not intended to replace advice given to you by your health care provider. Make sure you discuss any questions you have with your health care provider.  Document Released: 04/05/2004 Document Revised: 08/06/2017 Document Reviewed: 08/06/2017  Elsevier Interactive Patient Education  2019 [employer]

## 2018-10-03 ENCOUNTER — Emergency Department (HOSPITAL_COMMUNITY): Payer: Medicare Other

## 2018-10-03 ENCOUNTER — Encounter (HOSPITAL_COMMUNITY): Payer: Self-pay | Admitting: *Deleted

## 2018-10-03 ENCOUNTER — Inpatient Hospital Stay (HOSPITAL_COMMUNITY)
Admission: EM | Admit: 2018-10-03 | Discharge: 2018-10-07 | DRG: 392 | Disposition: A | Discharge status: Home / Self Care | Payer: Medicare Other | Attending: Nephrology | Admitting: Nephrology

## 2018-10-03 DIAGNOSIS — F17210 Nicotine dependence, cigarettes, uncomplicated: Secondary | ICD-10-CM | POA: Diagnosis present

## 2018-10-03 DIAGNOSIS — G8929 Other chronic pain: Secondary | ICD-10-CM | POA: Diagnosis present

## 2018-10-03 DIAGNOSIS — E876 Hypokalemia: Secondary | ICD-10-CM | POA: Diagnosis present

## 2018-10-03 DIAGNOSIS — M549 Dorsalgia, unspecified: Secondary | ICD-10-CM | POA: Diagnosis present

## 2018-10-03 DIAGNOSIS — M1990 Unspecified osteoarthritis, unspecified site: Secondary | ICD-10-CM | POA: Diagnosis present

## 2018-10-03 DIAGNOSIS — R112 Nausea with vomiting, unspecified: Secondary | ICD-10-CM | POA: Diagnosis not present

## 2018-10-03 DIAGNOSIS — Z79899 Other long term (current) drug therapy: Secondary | ICD-10-CM

## 2018-10-03 DIAGNOSIS — K838 Other specified diseases of biliary tract: Secondary | ICD-10-CM | POA: Diagnosis present

## 2018-10-03 DIAGNOSIS — R7301 Impaired fasting glucose: Secondary | ICD-10-CM | POA: Diagnosis present

## 2018-10-03 DIAGNOSIS — Z20828 Contact with and (suspected) exposure to other viral communicable diseases: Secondary | ICD-10-CM | POA: Diagnosis present

## 2018-10-03 DIAGNOSIS — R109 Unspecified abdominal pain: Secondary | ICD-10-CM | POA: Diagnosis present

## 2018-10-03 DIAGNOSIS — M069 Rheumatoid arthritis, unspecified: Secondary | ICD-10-CM | POA: Diagnosis present

## 2018-10-03 LAB — COMPREHENSIVE METABOLIC PANEL
ALT: 16 U/L (ref 0–44)
AST: 23 U/L (ref 15–41)
Albumin: 4 g/dL (ref 3.5–5.0)
Alkaline Phosphatase: 90 U/L (ref 38–126)
Anion gap: 16 — ABNORMAL HIGH (ref 5–15)
BUN: 10 mg/dL (ref 6–20)
CO2: 16 mmol/L — ABNORMAL LOW (ref 22–32)
Calcium: 9.3 mg/dL (ref 8.9–10.3)
Chloride: 107 mmol/L (ref 98–111)
Creatinine, Ser: 0.76 mg/dL (ref 0.44–1.00)
GFR calc Af Amer: >60 mL/min (ref >60)
GFR calc non Af Amer: >60 mL/min (ref >60)
Glucose, Bld: 132 mg/dL — ABNORMAL HIGH (ref 70–99)
Potassium: 3.6 mmol/L (ref 3.5–5.1)
Sodium: 139 mmol/L (ref 135–145)
Total Bilirubin: 0.6 mg/dL (ref 0.3–1.2)
Total Protein: 8.2 g/dL — ABNORMAL HIGH (ref 6.5–8.1)

## 2018-10-03 LAB — LIPASE, BLOOD: Lipase: 32 U/L (ref 11–51)

## 2018-10-03 LAB — CBC
HCT: 40.1 % (ref 36.0–46.0)
Hemoglobin: 13.8 g/dL (ref 12.0–15.0)
MCH: 30.1 pg (ref 26.0–34.0)
MCHC: 34.4 g/dL (ref 30.0–36.0)
MCV: 87.4 fL (ref 80.0–100.0)
Platelets: 354 10*3/uL (ref 150–400)
RBC: 4.59 MIL/uL (ref 3.87–5.11)
RDW: 13.2 % (ref 11.5–15.5)
WBC: 7.6 10*3/uL (ref 4.0–10.5)
nRBC: 0 % (ref 0.0–0.2)

## 2018-10-03 LAB — RAPID URINE DRUG SCREEN, HOSP PERFORMED
Amphetamines: NOT DETECTED
Barbiturates: NOT DETECTED
Benzodiazepines: NOT DETECTED
Cocaine: NOT DETECTED
Opiates: POSITIVE — AB
Tetrahydrocannabinol: NOT DETECTED

## 2018-10-03 MED ORDER — METOCLOPRAMIDE HCL 10 MG PO TABS
10.0000 mg | ORAL_TABLET | Freq: Once | ORAL | Status: DC
  Filled 2018-10-03: qty 1

## 2018-10-03 MED ORDER — SODIUM CHLORIDE (PF) 0.9 % IJ SOLN
INTRAMUSCULAR | Status: AC
  Filled 2018-10-03: qty 50

## 2018-10-03 MED ORDER — METOCLOPRAMIDE HCL 5 MG/ML IJ SOLN
10.0000 mg | Freq: Once | INTRAMUSCULAR | Status: DC
  Filled 2018-10-03: qty 2

## 2018-10-03 MED ORDER — ACETAMINOPHEN 325 MG PO TABS: 650.0000 mg | ORAL_TABLET | Freq: Once | ORAL | Status: DC

## 2018-10-03 MED ORDER — ALUM & MAG HYDROXIDE-SIMETH 200-200-20 MG/5ML PO SUSP
30.0000 mL | Freq: Once | ORAL | Status: DC
  Filled 2018-10-03: qty 30

## 2018-10-03 MED ORDER — MORPHINE SULFATE (PF) 4 MG/ML IV SOLN
4.0000 mg | Freq: Once | INTRAVENOUS | Status: AC
  Administered 2018-10-03: 4 mg via INTRAMUSCULAR
  Filled 2018-10-03: qty 1

## 2018-10-03 MED ORDER — IOHEXOL 300 MG/ML  SOLN
100.0000 mL | Freq: Once | INTRAMUSCULAR | Status: AC | PRN
  Administered 2018-10-03: 100 mL via INTRAVENOUS

## 2018-10-03 MED ORDER — ONDANSETRON 4 MG PO TBDP
4.0000 mg | ORAL_TABLET | Freq: Once | ORAL | Status: AC
  Administered 2018-10-03: 4 mg via ORAL
  Filled 2018-10-03: qty 1

## 2018-10-03 MED ORDER — SODIUM CHLORIDE 0.9% FLUSH
3.0000 mL | Freq: Once | INTRAVENOUS | Status: AC
  Administered 2018-10-03: 3 mL via INTRAVENOUS

## 2018-10-03 MED ORDER — FAMOTIDINE IN NACL 20-0.9 MG/50ML-% IV SOLN
20.0000 mg | Freq: Once | INTRAVENOUS | Status: DC
  Filled 2018-10-03: qty 50

## 2018-10-03 MED ORDER — SODIUM CHLORIDE 0.9 % IV BOLUS
1000.0000 mL | Freq: Once | INTRAVENOUS | Status: AC
  Administered 2018-10-03: 1000 mL via INTRAVENOUS

## 2018-10-03 MED ORDER — SUCRALFATE 1 G PO TABS
1.0000 g | ORAL_TABLET | Freq: Once | ORAL | Status: DC
  Filled 2018-10-03: qty 1

## 2018-10-03 MED ORDER — DICYCLOMINE HCL 10 MG/ML IM SOLN
20.0000 mg | Freq: Once | INTRAMUSCULAR | Status: AC
  Administered 2018-10-03: 20 mg via INTRAMUSCULAR
  Filled 2018-10-03: qty 2

## 2018-10-03 MED ORDER — HALOPERIDOL LACTATE 5 MG/ML IJ SOLN
2.0000 mg | Freq: Once | INTRAMUSCULAR | Status: AC
  Administered 2018-10-03: 2 mg via INTRAVENOUS
  Filled 2018-10-03: qty 1

## 2018-10-03 MED ORDER — PROMETHAZINE HCL 25 MG RE SUPP
25.0000 mg | Freq: Four times a day (QID) | RECTAL | Status: DC | PRN
  Administered 2018-10-03: 25 mg via RECTAL
  Filled 2018-10-03 (×3): qty 1

## 2018-10-03 MED ORDER — FAMOTIDINE 20 MG PO TABS
20.0000 mg | ORAL_TABLET | Freq: Once | ORAL | Status: DC
  Filled 2018-10-03: qty 1

## 2018-10-03 MED ORDER — CAPSAICIN 0.025 % EX CREA
TOPICAL_CREAM | Freq: Once | CUTANEOUS | Status: AC
  Administered 2018-10-03: 20:00:00 via TOPICAL
  Filled 2018-10-03: qty 60

## 2018-10-03 NOTE — ED Triage Notes (Signed)
Per EMS, pt from home complains of n/v/abdominal pain. Pt has chronic abdominal pain that flares up a couple of times a month. 4mg  zofran and 500cc bolus given en route.  BP 144/108 HR 86 RR 26 O2 100% CBG 158 Temp 97.9

## 2018-10-03 NOTE — ED Notes (Signed)
Patient removed IV

## 2018-10-03 NOTE — ED Notes (Signed)
Patient would not remain still enough to give care. When RN asked the patient multiple times to put her mask on she did not. RN told the patient she would be back to give care when patient put her mask back on.

## 2018-10-03 NOTE — ED Notes (Signed)
Pt unable to urinate at this time will try again later

## 2018-10-03 NOTE — ED Provider Notes (Signed)
[facility]-EMERGENCY DEPT Provider Note   CSN: [id number] Arrival date & time: 10/03/18  1509    History   Chief Complaint Chief Complaint  Patient presents with   Abdominal Pain    HPI Kristen Ramos is a 58 y.o. female.     HPI   Patient is a 58 year old female with a history of arthritis, chronic back pain, who presents to the emergency department today for evaluation of nausea, vomiting and abdominal pain.  Patient states she began to have diffuse abdominal pain starting around 6 AM this morning.  It is constant.  She is had associated nausea and vomiting.  Has had no diarrhea.  Has had no fevers.  No chest pain or shortness of breath.  No other symptoms.  She is tried some nausea medication at home.  Denies drug or EtOH use.  Reviewed records, patient has been seen many times for chronic abdominal pain and intractable nausea and vomiting.  She has had thorough work-up from GI including CT scan of the abdomen/pelvis, MRCP and a gastric emptying study.  Findings showed dilated common bile duct with no obstructing stone.  Gastric emptying study was normal.  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   Intractable nausea and vomiting 02/12/2018   Epigastric abdominal pain 02/12/2018   Acute gastroenteritis 02/12/2018    Past Surgical History:  Procedure Laterality Date   ABDOMINAL HYSTERECTOMY     BACK SURGERY     BIOPSY  04/19/2018   Procedure: BIOPSY;  Surgeon: Kerin Salen, MD;  Location: [facility];  Service: Gastroenterology;;   CESAREAN SECTION     ESOPHAGOGASTRODUODENOSCOPY (EGD) WITH PROPOFOL N/A 04/19/2018   Procedure: ESOPHAGOGASTRODUODENOSCOPY (EGD) WITH PROPOFOL;  Surgeon: Kerin Salen, MD;  Location: [facility];  Service: Gastroenterology;   Laterality: N/A;     OB History   No obstetric history on file.      Home Medications    Prior to Admission medications   Medication Sig Start Date End Date Taking? Authorizing Provider  ondansetron (ZOFRAN-ODT) 4 MG disintegrating tablet Take 1 tablet (4 mg total) by mouth every 8 (eight) hours as needed for nausea or vomiting. 07/21/18  Yes Seawell, Jaimie A, DO  oxyCODONE (ROXICODONE) 15 MG immediate release tablet Take 15 mg by mouth 4 (four) times daily as needed for pain.  02/03/18  Yes [provider]  pantoprazole (PROTONIX) 40 MG tablet Take 1 tablet (40 mg total) by mouth daily. 07/21/18  Yes Seawell, Jaimie A, DO  tiZANidine (ZANAFLEX) 4 MG tablet Take 4 mg by mouth 2 (two) times daily as needed for muscle spasms. 09/29/18  Yes [provider]    Family History No family history on file.  Social History Social History   Tobacco Use   Smoking status: Current Every Day Smoker    Types: Cigarettes   Smokeless tobacco: Never Used  Substance Use Topics   Alcohol use: No   Drug use: No     Allergies   Bee venom   Review of Systems Review of Systems  Constitutional: Negative for fever.  HENT: Negative for ear pain and sore throat.   Eyes: Negative for visual disturbance.  Respiratory: Negative for cough and shortness of breath.   Cardiovascular: Negative for chest pain.  Gastrointestinal:  Positive for abdominal pain, nausea and vomiting. Negative for constipation and diarrhea.  Genitourinary: Negative for dysuria and hematuria.  Musculoskeletal: Negative for back pain.  Skin: Negative for rash.  Neurological: Negative for headaches.  All other systems reviewed and are negative.    Physical Exam Updated Vital Signs BP (!) 147/86    Pulse 78    Temp (!) 100.5 F (38.1 C) (Rectal)    Resp 12    SpO2 100%   Physical Exam Vitals signs and nursing note reviewed.  Constitutional:      General: She is not in acute distress.    Appearance:  She is well-developed.     Comments: Patient laying comfortably in bed initially, however when I enter the room she immediately starts moving around on the bed and on her hands and knees stating that she cannot get comfortable and hyperventilating.  She makes several trips to the trash can to spit up mucus but does not have any vomiting or dry heaving.  She is somewhat uncooperative with the exam.  HENT:     Head: Normocephalic and atraumatic.  Eyes:     Conjunctiva/sclera: Conjunctivae normal.  Neck:     Musculoskeletal: Neck supple.  Cardiovascular:     Rate and Rhythm: Normal rate and regular rhythm.     Heart sounds: Normal heart sounds. No murmur.  Pulmonary:     Effort: Pulmonary effort is normal. No respiratory distress.     Breath sounds: Normal breath sounds. No wheezing, rhonchi or rales.  Abdominal:     General: Bowel sounds are normal.     Palpations: Abdomen is soft.     Tenderness: There is no abdominal tenderness. There is no right CVA tenderness, left CVA tenderness, guarding or rebound.  Skin:    General: Skin is warm and dry.  Neurological:     Mental Status: She is alert.  Psychiatric:     Comments: Anxious      ED Treatments / Results  Labs (all labs ordered are listed, but only abnormal results are displayed) Labs Reviewed  COMPREHENSIVE METABOLIC PANEL - Abnormal; Notable for the following components:      Result Value   CO2 16 (*)    Glucose, Bld 132 (*)    Total Protein 8.2 (*)    Anion gap 16 (*)    All other components within normal limits  RAPID URINE DRUG SCREEN, HOSP PERFORMED - Abnormal; Notable for the following components:   Opiates POSITIVE (*)    All other components within normal limits  SARS CORONAVIRUS 2 (HOSPITAL ORDER, PERFORMED IN [facility])  LIPASE, BLOOD  CBC  URINALYSIS, ROUTINE W REFLEX MICROSCOPIC  LACTIC ACID, PLASMA  LACTIC ACID, PLASMA    EKG EKG Interpretation  Date/Time:  Friday October 03 2018 23:07:55  EDT Ventricular Rate:  80 PR Interval:    QRS Duration: 89 QT Interval:  406 QTC Calculation: 469 R Axis:   48 Text Interpretation:  Sinus rhythm Probable left atrial enlargement Left ventricular hypertrophy Baseline wander in lead(s) II III aVR aVF No significant change since last tracing Confirmed by Drema Pryardama, Pedro [phone]([id number]) on 10/04/2018 12:01:17 AM   Radiology Ct Abdomen Pelvis W Contrast  Result Date: 10/03/2018 CLINICAL DATA:  Diffuse abdominal pain and fever. EXAM: CT ABDOMEN AND PELVIS WITH CONTRAST TECHNIQUE: Multidetector CT imaging of the abdomen and pelvis was performed using the standard protocol following bolus administration of intravenous contrast. CONTRAST:  100mL OMNIPAQUE IOHEXOL 300 MG/ML  SOLN  COMPARISON:  April 15, 2018 FINDINGS: Lower chest: The lung bases are clear. The heart size is normal. Hepatobiliary: The liver is normal. Normal gallbladder.Again identified is common bile duct dilatation, similar to prior studies. Pancreas: Normal contours without ductal dilatation. No peripancreatic fluid collection. Spleen: No splenic laceration or hematoma. Adrenals/Urinary Tract: --Adrenal glands: The right adrenal gland is unremarkable. There is stable nodularity of the left adrenal gland. --Right kidney/ureter: No hydronephrosis or perinephric hematoma. --Left kidney/ureter: No hydronephrosis or perinephric hematoma. --Urinary bladder: Unremarkable. Stomach/Bowel: --Stomach/Duodenum: No hiatal hernia or other gastric abnormality. Normal duodenal course and caliber. --Small bowel: No dilatation or inflammation. --Colon: There are scattered colonic diverticula without CT evidence of diverticulitis. --Appendix: Normal. Vascular/Lymphatic: Atherosclerotic calcification is present within the non-aneurysmal abdominal aorta, without hemodynamically significant stenosis. --No retroperitoneal lymphadenopathy. --No mesenteric lymphadenopathy. --No pelvic or inguinal lymphadenopathy. Reproductive:  Unremarkable Other: No ascites or free air. The abdominal wall is normal. Musculoskeletal. There are stable postsurgical changes of the lumbar spine. There is no acute displaced fracture. IMPRESSION: 1. No acute intra-abdominal abnormality detected. 2. Chronic changes as above, not significantly changed from 04/15/2018 CT and pelvis. Electronically Signed   By: Constance Holster M.D.   On: 10/03/2018 23:43    Procedures Procedures (including critical care time)  Medications Ordered in ED Medications  sucralfate (CARAFATE) tablet 1 g (0 g Oral Hold 10/03/18 1949)  alum & mag hydroxide-simeth (MAALOX/MYLANTA) 200-200-20 MG/5ML suspension 30 mL (0 mLs Oral Hold 10/03/18 1949)  famotidine (PEPCID) tablet 20 mg (0 mg Oral Hold 10/03/18 1948)  metoCLOPramide (REGLAN) tablet 10 mg (0 mg Oral Hold 10/03/18 1948)  promethazine (PHENERGAN) suppository 25 mg (25 mg Rectal Given 10/03/18 2056)  acetaminophen (TYLENOL) tablet 650 mg (650 mg Oral Not Given 10/03/18 2304)  sodium chloride (PF) 0.9 % injection (has no administration in time range)  sodium chloride flush (NS) 0.9 % injection 3 mL (3 mLs Intravenous Given by Other 10/03/18 2319)  sodium chloride 0.9 % bolus 1,000 mL (1,000 mLs Intravenous New Bag/Given 10/03/18 2203)  capsaicin (ZOSTRIX) 0.025 % cream ( Topical Given 10/03/18 1947)  dicyclomine (BENTYL) injection 20 mg (20 mg Intramuscular Given 10/03/18 1951)  ondansetron (ZOFRAN-ODT) disintegrating tablet 4 mg (4 mg Oral Given 10/03/18 1947)  morphine 4 MG/ML injection 4 mg (4 mg Intramuscular Given 10/03/18 2053)  iohexol (OMNIPAQUE) 300 MG/ML solution 100 mL (100 mLs Intravenous Contrast Given 10/03/18 2320)  haloperidol lactate (HALDOL) injection 2 mg (2 mg Intravenous Given 10/03/18 2336)     Initial Impression / Assessment and Plan / ED Course  I have reviewed the triage vital signs and the nursing notes.  Pertinent labs & imaging results that were available during my care of the patient were  reviewed by me and considered in my medical decision making (see chart for details).    Final Clinical Impressions(s) / ED Diagnoses   Final diagnoses:  Intractable vomiting with nausea, unspecified vomiting type   58 year old female presenting for evaluation of diffuse abdominal pain, nausea and vomiting starting at 6 AM this morning.  Has had many similar presentations in the past for similar but work-up has thus far been unrevealing.  Initial vital signs are reassuring.  Patient nontoxic, nonseptic appearing.  CBC is without leukocytosis.  Also without anemia. CMP with low bicarb, suspect secondary to hyperventilation.  Also with elevated anion gap at 16.  Her glucose is normal. Lipase is normal. UA pending at time of admission UDS pending at the time of admission  CT abd/pelvis  with no acute intra-abdominal abnormality detected. 2. Chronic changes as above, not significantly changed from 04/15/2018 CT and pelvis.  Informed by RN that pt pulled her IV out. Will reorder meds orally.   8:18 PM Pt still vomiting. Will order additional antiemetics and pain meds. IV team has been consulted.   Pt still vomiting will give haldol.   Patient still complaining of pain and vomiting.  Will plan for admission to hospitalist service for intractable nausea and vomiting.  12:07 PM CONSULT with Dr. Allena Katz with hospitalist service who accepts the patient for admission.   ED Discharge Orders    None       Rayne Du 10/04/18 0008    Pricilla Loveless, MD 10/04/18 1736

## 2018-10-04 ENCOUNTER — Encounter (HOSPITAL_COMMUNITY): Payer: Self-pay

## 2018-10-04 ENCOUNTER — Other Ambulatory Visit: Payer: Self-pay

## 2018-10-04 DIAGNOSIS — R7301 Impaired fasting glucose: Secondary | ICD-10-CM | POA: Diagnosis present

## 2018-10-04 DIAGNOSIS — G8929 Other chronic pain: Secondary | ICD-10-CM | POA: Diagnosis present

## 2018-10-04 DIAGNOSIS — M549 Dorsalgia, unspecified: Secondary | ICD-10-CM | POA: Diagnosis present

## 2018-10-04 DIAGNOSIS — R109 Unspecified abdominal pain: Secondary | ICD-10-CM | POA: Diagnosis present

## 2018-10-04 DIAGNOSIS — F17210 Nicotine dependence, cigarettes, uncomplicated: Secondary | ICD-10-CM | POA: Diagnosis present

## 2018-10-04 DIAGNOSIS — Z79899 Other long term (current) drug therapy: Secondary | ICD-10-CM | POA: Diagnosis not present

## 2018-10-04 DIAGNOSIS — M1990 Unspecified osteoarthritis, unspecified site: Secondary | ICD-10-CM | POA: Diagnosis present

## 2018-10-04 DIAGNOSIS — Z20828 Contact with and (suspected) exposure to other viral communicable diseases: Secondary | ICD-10-CM | POA: Diagnosis present

## 2018-10-04 DIAGNOSIS — M069 Rheumatoid arthritis, unspecified: Secondary | ICD-10-CM | POA: Diagnosis present

## 2018-10-04 DIAGNOSIS — R112 Nausea with vomiting, unspecified: Secondary | ICD-10-CM | POA: Diagnosis present

## 2018-10-04 DIAGNOSIS — E876 Hypokalemia: Secondary | ICD-10-CM | POA: Diagnosis present

## 2018-10-04 DIAGNOSIS — R1013 Epigastric pain: Secondary | ICD-10-CM | POA: Diagnosis not present

## 2018-10-04 DIAGNOSIS — K838 Other specified diseases of biliary tract: Secondary | ICD-10-CM | POA: Diagnosis present

## 2018-10-04 LAB — URINALYSIS, ROUTINE W REFLEX MICROSCOPIC
Bilirubin Urine: NEGATIVE
Glucose, UA: NEGATIVE mg/dL
Hgb urine dipstick: NEGATIVE
Ketones, ur: NEGATIVE mg/dL
Leukocytes,Ua: NEGATIVE
Nitrite: NEGATIVE
Protein, ur: NEGATIVE mg/dL
Specific Gravity, Urine: 1.015 (ref 1.005–1.030)
pH: 8 (ref 5.0–8.0)

## 2018-10-04 LAB — BASIC METABOLIC PANEL
Anion gap: 11 (ref 5–15)
BUN: 9 mg/dL (ref 6–20)
CO2: 21 mmol/L — ABNORMAL LOW (ref 22–32)
Calcium: 8.9 mg/dL (ref 8.9–10.3)
Chloride: 104 mmol/L (ref 98–111)
Creatinine, Ser: 0.59 mg/dL (ref 0.44–1.00)
GFR calc Af Amer: >60 mL/min (ref >60)
GFR calc non Af Amer: >60 mL/min (ref >60)
Glucose, Bld: 137 mg/dL — ABNORMAL HIGH (ref 70–99)
Potassium: 3.3 mmol/L — ABNORMAL LOW (ref 3.5–5.1)
Sodium: 136 mmol/L (ref 135–145)

## 2018-10-04 LAB — CBC
HCT: 40.3 % (ref 36.0–46.0)
Hemoglobin: 13.1 g/dL (ref 12.0–15.0)
MCH: 28.9 pg (ref 26.0–34.0)
MCHC: 32.5 g/dL (ref 30.0–36.0)
MCV: 89 fL (ref 80.0–100.0)
Platelets: 333 10*3/uL (ref 150–400)
RBC: 4.53 MIL/uL (ref 3.87–5.11)
RDW: 13.3 % (ref 11.5–15.5)
WBC: 9.7 10*3/uL (ref 4.0–10.5)
nRBC: 0 % (ref 0.0–0.2)

## 2018-10-04 LAB — LACTIC ACID, PLASMA
Lactic Acid, Venous: 1 mmol/L (ref 0.5–1.9)
Lactic Acid, Venous: 1.2 mmol/L (ref 0.5–1.9)

## 2018-10-04 LAB — SARS CORONAVIRUS 2 BY RT PCR (HOSPITAL ORDER, PERFORMED IN ~~LOC~~ HOSPITAL LAB): SARS Coronavirus 2: NEGATIVE

## 2018-10-04 LAB — ETHANOL: Alcohol, Ethyl (B): <10 mg/dL (ref <10)

## 2018-10-04 MED ORDER — OXYCODONE HCL 5 MG PO TABS
15.0000 mg | ORAL_TABLET | Freq: Four times a day (QID) | ORAL | Status: DC | PRN
  Administered 2018-10-05 – 2018-10-07 (×9): 15 mg via ORAL
  Filled 2018-10-04 (×9): qty 3

## 2018-10-04 MED ORDER — ENOXAPARIN SODIUM 40 MG/0.4ML ~~LOC~~ SOLN
40.0000 mg | Freq: Every day | SUBCUTANEOUS | Status: DC
  Administered 2018-10-04 – 2018-10-07 (×4): 40 mg via SUBCUTANEOUS
  Filled 2018-10-04 (×5): qty 0.4

## 2018-10-04 MED ORDER — LACTATED RINGERS IV SOLN
INTRAVENOUS | Status: DC
  Administered 2018-10-04 – 2018-10-07 (×5): via INTRAVENOUS

## 2018-10-04 MED ORDER — PANTOPRAZOLE SODIUM 40 MG IV SOLR
40.0000 mg | Freq: Two times a day (BID) | INTRAVENOUS | Status: DC
  Administered 2018-10-04 – 2018-10-07 (×6): 40 mg via INTRAVENOUS
  Filled 2018-10-04 (×6): qty 40

## 2018-10-04 MED ORDER — PANTOPRAZOLE SODIUM 40 MG PO TBEC
40.0000 mg | DELAYED_RELEASE_TABLET | Freq: Every day | ORAL | Status: DC
  Administered 2018-10-04: 40 mg via ORAL
  Filled 2018-10-04: qty 1

## 2018-10-04 MED ORDER — ONDANSETRON HCL 4 MG/2ML IJ SOLN
4.0000 mg | Freq: Four times a day (QID) | INTRAMUSCULAR | Status: DC | PRN
  Administered 2018-10-04: 4 mg via INTRAVENOUS
  Filled 2018-10-04: qty 2

## 2018-10-04 MED ORDER — ACETAMINOPHEN 650 MG RE SUPP: 650.0000 mg | Freq: Four times a day (QID) | RECTAL | Status: DC | PRN

## 2018-10-04 MED ORDER — METOCLOPRAMIDE HCL 5 MG/ML IJ SOLN
5.0000 mg | Freq: Three times a day (TID) | INTRAMUSCULAR | Status: DC
  Administered 2018-10-04 – 2018-10-07 (×10): 5 mg via INTRAVENOUS
  Filled 2018-10-04 (×10): qty 2

## 2018-10-04 MED ORDER — POTASSIUM CHLORIDE 10 MEQ/100ML IV SOLN
10.0000 meq | INTRAVENOUS | Status: AC
  Administered 2018-10-04 (×4): 10 meq via INTRAVENOUS
  Filled 2018-10-04 (×4): qty 100

## 2018-10-04 MED ORDER — SODIUM CHLORIDE 0.9 % IV SOLN
INTRAVENOUS | Status: AC
  Administered 2018-10-04 (×2): via INTRAVENOUS

## 2018-10-04 MED ORDER — PROMETHAZINE HCL 25 MG/ML IJ SOLN
12.5000 mg | Freq: Four times a day (QID) | INTRAMUSCULAR | Status: DC | PRN
  Administered 2018-10-04: 12.5 mg via INTRAVENOUS
  Filled 2018-10-04: qty 1

## 2018-10-04 MED ORDER — ENOXAPARIN SODIUM 40 MG/0.4ML ~~LOC~~ SOLN
40.0000 mg | SUBCUTANEOUS | Status: DC
  Filled 2018-10-04: qty 0.4

## 2018-10-04 MED ORDER — ACETAMINOPHEN 325 MG PO TABS
650.0000 mg | ORAL_TABLET | Freq: Four times a day (QID) | ORAL | Status: DC | PRN
  Administered 2018-10-07: 650 mg via ORAL
  Filled 2018-10-04: qty 2

## 2018-10-04 NOTE — ED Notes (Signed)
ED TO INPATIENT HANDOFF REPORT  Name/Age/Gender Kristen Ramos 57 y.o. female  Code Status    Code Status Orders  (From admission, onward)         Start     Ordered   10/04/18 0043  Full code  Continuous     10/04/18 0044        Code Status History    Date Active Date Inactive Code Status Order ID Comments User Context   07/15/2018 2117 07/21/2018 2015 Full Code [id number]  Lorenso Courier, MD ED   04/16/2018 0209 04/21/2018 1400 Full Code [id number]  Johnson-Pitts, Leavy Cella, MD Inpatient   Advance Care Planning Activity      Home/SNF/Other Home  Chief Complaint Nausea; Vomiting  Level of Care/Admitting Diagnosis ED Disposition    ED Disposition Condition Comment   Admit  Hospital Area: [facility] [[id number]]  Level of Care: Med-Surg [16]  Covid Evaluation: Asymptomatic Screening Protocol (No Symptoms)  Diagnosis: Intractable nausea and vomiting [720114]  Admitting Physician: Charlsie Quest [[id number]]  Attending Physician: Charlsie Quest [[id number]]  PT Class (Do Not Modify): Observation [104]  PT Acc Code (Do Not Modify): Observation [10022]       Medical History Past Medical History:  Diagnosis Date  . Arthritis   . Chronic back pain     Allergies Allergies  Allergen Reactions  . Bee Venom Swelling    IV Location/Drains/Wounds Patient Lines/Drains/Airways Status   Active Line/Drains/Airways    Name:   Placement date:   Placement time:   Site:   Days:   Peripheral IV 10/03/18 Left Hand   10/03/18    2158    Hand   1          Labs/Imaging Results for orders placed or performed during the hospital encounter of 10/03/18 (from the past 48 hour(s))  Lipase, blood     Status: None   Collection Time: 10/03/18  3:42 PM  Result Value Ref Range   Lipase 32 11 - 51 U/L    Comment: Performed at [facility], [address], [place], [place] [address]  Comprehensive metabolic panel     Status: Abnormal   Collection Time:  10/03/18  3:42 PM  Result Value Ref Range   Sodium 139 135 - 145 mmol/L   Potassium 3.6 3.5 - 5.1 mmol/L   Chloride 107 98 - 111 mmol/L   CO2 16 (L) 22 - 32 mmol/L   Glucose, Bld 132 (H) 70 - 99 mg/dL   BUN 10 6 - 20 mg/dL   Creatinine, Ser 7.41 0.44 - 1.00 mg/dL   Calcium 9.3 8.9 - 28.7 mg/dL   Total Protein 8.2 (H) 6.5 - 8.1 g/dL   Albumin 4.0 3.5 - 5.0 g/dL   AST 23 15 - 41 U/L   ALT 16 0 - 44 U/L   Alkaline Phosphatase 90 38 - 126 U/L   Total Bilirubin 0.6 0.3 - 1.2 mg/dL   GFR calc non Af Amer >60 >60 mL/min   GFR calc Af Amer >60 >60 mL/min   Anion gap 16 (H) 5 - 15    Comment: Performed at [facility], [address] [place], [place] [address]  CBC     Status: None   Collection Time: 10/03/18  3:42 PM  Result Value Ref Range   WBC 7.6 4.0 - 10.5 K/uL   RBC 4.59 3.87 - 5.11 MIL/uL   Hemoglobin 13.8 12.0 - 15.0 g/dL   HCT  40.1 36.0 - 46.0 %   MCV 87.4 80.0 - 100.0 fL   MCH 30.1 26.0 - 34.0 pg   MCHC 34.4 30.0 - 36.0 g/dL   RDW 10.6 26.9 - 48.5 %   Platelets 354 150 - 400 K/uL   nRBC 0.0 0.0 - 0.2 %    Comment: Performed at [facility], [address], [place], [place] [address]  Urinalysis, Routine w reflex microscopic     Status: None   Collection Time: 10/03/18 11:06 PM  Result Value Ref Range   Color, Urine YELLOW YELLOW   APPearance CLEAR CLEAR   Specific Gravity, Urine 1.015 1.005 - 1.030   pH 8.0 5.0 - 8.0   Glucose, UA NEGATIVE NEGATIVE mg/dL   Hgb urine dipstick NEGATIVE NEGATIVE   Bilirubin Urine NEGATIVE NEGATIVE   Ketones, ur NEGATIVE NEGATIVE mg/dL   Protein, ur NEGATIVE NEGATIVE mg/dL   Nitrite NEGATIVE NEGATIVE   Leukocytes,Ua NEGATIVE NEGATIVE    Comment: Performed at [facility], [address] [place], [place] [address]  Rapid urine drug screen (hospital performed)     Status: Abnormal   Collection Time: 10/03/18 11:06 PM  Result Value Ref Range   Opiates POSITIVE (A) NONE DETECTED    Cocaine NONE DETECTED NONE DETECTED   Benzodiazepines NONE DETECTED NONE DETECTED   Amphetamines NONE DETECTED NONE DETECTED   Tetrahydrocannabinol NONE DETECTED NONE DETECTED   Barbiturates NONE DETECTED NONE DETECTED    Comment: (NOTE) DRUG SCREEN FOR MEDICAL PURPOSES ONLY.  IF CONFIRMATION IS NEEDED FOR ANY PURPOSE, NOTIFY LAB WITHIN 5 DAYS. LOWEST DETECTABLE LIMITS FOR URINE DRUG SCREEN Drug Class                     Cutoff (ng/mL) Amphetamine and metabolites    1000 Barbiturate and metabolites    200 Benzodiazepine                 200 Tricyclics and metabolites     300 Opiates and metabolites        300 Cocaine and metabolites        300 THC                            50 Performed at [facility], [address], [place], [place] [address]    Ct Abdomen Pelvis W Contrast  Result Date: 10/03/2018 CLINICAL DATA:  Diffuse abdominal pain and fever. EXAM: CT ABDOMEN AND PELVIS WITH CONTRAST TECHNIQUE: Multidetector CT imaging of the abdomen and pelvis was performed using the standard protocol following bolus administration of intravenous contrast. CONTRAST:  OMNIPAQUE IOHEXOL 300 MG/ML  SOLN COMPARISON:  April 15, 2018 FINDINGS: Lower chest: The lung bases are clear. The heart size is normal. Hepatobiliary: The liver is normal. Normal gallbladder.Again identified is common bile duct dilatation, similar to prior studies. Pancreas: Normal contours without ductal dilatation. No peripancreatic fluid collection. Spleen: No splenic laceration or hematoma. Adrenals/Urinary Tract: --Adrenal glands: The right adrenal gland is unremarkable. There is stable nodularity of the left adrenal gland. --Right kidney/ureter: No hydronephrosis or perinephric hematoma. --Left kidney/ureter: No hydronephrosis or perinephric hematoma. --Urinary bladder: Unremarkable. Stomach/Bowel: --Stomach/Duodenum: No hiatal hernia or other gastric abnormality. Normal duodenal course and caliber.  --Small bowel: No dilatation or inflammation. --Colon: There are scattered colonic diverticula without CT evidence of diverticulitis. --Appendix: Normal. Vascular/Lymphatic: Atherosclerotic calcification is present within the non-aneurysmal abdominal aorta, without hemodynamically significant  stenosis. --No retroperitoneal lymphadenopathy. --No mesenteric lymphadenopathy. --No pelvic or inguinal lymphadenopathy. Reproductive: Unremarkable Other: No ascites or free air. The abdominal wall is normal. Musculoskeletal. There are stable postsurgical changes of the lumbar spine. There is no acute displaced fracture. IMPRESSION: 1. No acute intra-abdominal abnormality detected. 2. Chronic changes as above, not significantly changed from 04/15/2018 CT and pelvis. Electronically Signed   By: Constance Holster M.D.   On: 10/03/2018 23:43    Pending Labs Unresulted Labs (From admission, onward)    Start     Ordered   10/04/18 4854  Basic metabolic panel  Tomorrow morning,   R     10/04/18 0044   10/04/18 0500  CBC  Tomorrow morning,   R     10/04/18 0044   10/04/18 0047  Ethanol  Add-on,   AD     10/04/18 0046   10/03/18 2240  Lactic acid, plasma  Now then every 2 hours,   STAT     10/03/18 2239   10/03/18 2232  SARS Coronavirus 2 (CEPHEID - Performed in [place] hospital lab), Hosp Order  (Asymptomatic Patients Labs)  Once,   STAT    Question:  Rule Out  Answer:  Yes   10/03/18 2232          Vitals/Pain Today's Vitals   10/03/18 2200 10/03/18 2219 10/03/18 2300 10/04/18 0118  BP:  (!) 147/86 (!) 147/91 (!) 149/61  Pulse:  78 85 92  Resp:  12 18 17   Temp:      TempSrc:      SpO2:  100% 100% 100%  PainSc: 7        Isolation Precautions No active isolations  Medications Medications  sodium chloride (PF) 0.9 % injection (has no administration in time range)  0.9 %  sodium chloride infusion (has no administration in time range)  acetaminophen (TYLENOL) tablet 650 mg (has no  administration in time range)    Or  acetaminophen (TYLENOL) suppository 650 mg (has no administration in time range)  promethazine (PHENERGAN) injection 12.5 mg (has no administration in time range)  oxyCODONE (Oxy IR/ROXICODONE) immediate release tablet 15 mg (has no administration in time range)  pantoprazole (PROTONIX) EC tablet 40 mg (has no administration in time range)  enoxaparin (LOVENOX) injection 40 mg (has no administration in time range)  sodium chloride flush (NS) 0.9 % injection 3 mL (3 mLs Intravenous Given by Other 10/03/18 2319)  sodium chloride 0.9 % bolus 1,000 mL (0 mLs Intravenous Stopped 10/04/18 0127)  capsaicin (ZOSTRIX) 0.025 % cream ( Topical Given 10/03/18 1947)  dicyclomine (BENTYL) injection 20 mg (20 mg Intramuscular Given 10/03/18 1951)  ondansetron (ZOFRAN-ODT) disintegrating tablet 4 mg (4 mg Oral Given 10/03/18 1947)  morphine 4 MG/ML injection 4 mg (4 mg Intramuscular Given 10/03/18 2053)  iohexol (OMNIPAQUE) 300 MG/ML solution 100 mL (100 mLs Intravenous Contrast Given 10/03/18 2320)  haloperidol lactate (HALDOL) injection 2 mg (2 mg Intravenous Given 10/03/18 2336)    Mobility walks

## 2018-10-04 NOTE — ED Notes (Signed)
Multiple unsuccessful attempts made by multiple nurses to draw blood for ordered lactic acid. Making request to IV team for blood draw.

## 2018-10-04 NOTE — ED Notes (Signed)
Request made to lab for blood draw

## 2018-10-04 NOTE — Plan of Care (Signed)

## 2018-10-04 NOTE — H&P (Signed)
History and Physical    Kristen Ramos ZOX:[id number] DOB: 1960-07-31 DOA: 10/03/2018  PCP: Casimer LaniusAryal, Govinda, MD  Patient coming from: Home  I have personally briefly reviewed patient's old medical records in [facility]  Chief Complaint: Nausea and vomiting  HPI: Kristen Ramos is a 58 y.o. female with medical history significant for rheumatoid arthritis, chronic back pain, and suspected cyclical vomiting syndrome who presents to the ED for further evaluation of nausea and vomiting.  Patient states she was in her usual state of health until the morning prior to day of admission when she developed sudden onset of nausea and vomiting around 6 AM.  She reports persistent symptoms and inability to maintain adequate oral intake.  She reports clear emesis without hematemesis or coffee-ground appearance.  She has had associated generalized abdominal discomfort.  She denies any diarrhea, constipation, dysuria, subjective fevers, chills, diaphoresis.  She denies any recent changes in her medications.  She denies any chest pain or dyspnea.  Prior GI work-up has been unrevealing.  MRCP 04/17/2018 showed persistent CBD dilatation 1.6 cm in maximum diameter and mild intrahepatic duct dilatation without obstructing stone or discrete mass.  Upper endoscopy 04/19/2018 showed normal esophagus, regular Z line, erythematous mucosa in the antrum, normal appearing duodenum and.  Gastric emptying study 07/21/2018 showed near complete emptying from the stomach after 1 hour without evidence to suggest gastroparesis.   ED Course:  Initial vitals showed BP 134/112, pulse 99, RR 15, temp 99.0 Fahrenheit, SPO2 100% on room air.  Rectal temperature was 100.5 Fahrenheit.  Labs notable for WBC 7.6, hemoglobin 13.8, platelets 354,000, sodium 139, potassium 3.6, bicarb 16, BUN 10, creatinine 0.76, lipase 32, urinalysis negative for UTI.  UDS is positive for opiates (expected as patient is prescribed oxycodone).  SARS-CoV-2 test  is obtained and pending.  CT abdomen/pelvis was negative for acute intra-abdominal abnormality.  Patient was given 1 L normal saline, IV Haldol 2 mg once, IV morphine 4 mg once, Maalox, Bentyl, Pepcid, Reglan, Zofran, and sucralfate.  Patient has persistent nausea and vomiting therefore the hospitalist service was consulted to admit for further evaluation and management.   Review of Systems: All systems reviewed and are negative except as documented in history of present illness above.   Past Medical History:  Diagnosis Date  . Arthritis   . Chronic back pain     Past Surgical History:  Procedure Laterality Date  . ABDOMINAL HYSTERECTOMY    . BACK SURGERY    . BIOPSY  04/19/2018   Procedure: BIOPSY;  Surgeon: Kerin SalenKarki, Arya, MD;  Location: [facility];  Service: Gastroenterology;;  . CESAREAN SECTION    . ESOPHAGOGASTRODUODENOSCOPY (EGD) WITH PROPOFOL N/A 04/19/2018   Procedure: ESOPHAGOGASTRODUODENOSCOPY (EGD) WITH PROPOFOL;  Surgeon: Kerin SalenKarki, Arya, MD;  Location: [facility];  Service: Gastroenterology;  Laterality: N/A;    Social History:  reports that she has been smoking cigarettes. She has never used smokeless tobacco. She reports that she does not drink alcohol or use drugs.  Allergies  Allergen Reactions  . Bee Venom Swelling    No family history on file.   Prior to Admission medications   Medication Sig Start Date End Date Taking? Authorizing Provider  ondansetron (ZOFRAN-ODT) 4 MG disintegrating tablet Take 1 tablet (4 mg total) by mouth every 8 (eight) hours as needed for nausea or vomiting. 07/21/18  Yes Seawell, Jaimie A, DO  oxyCODONE (ROXICODONE) 15 MG immediate release tablet Take 15 mg by mouth 4 (four) times daily as needed for pain.  02/03/18  Yes [provider]  pantoprazole (PROTONIX) 40 MG tablet Take 1 tablet (40 mg total) by mouth daily. 07/21/18  Yes Seawell, Jaimie A, DO  tiZANidine (ZANAFLEX) 4 MG tablet Take 4 mg by mouth 2 (two) times daily as  needed for muscle spasms. 09/29/18  Yes [provider]    Physical Exam: Vitals:   10/03/18 2027 10/03/18 2030 10/03/18 2100 10/03/18 2219  BP: 118/65 (!) 162/68  (!) 147/86  Pulse: 80 66 77 78  Resp: 14   12  Temp:  (!) 100.5 F (38.1 C)    TempSrc:  Rectal    SpO2: 97% 100% 99% 100%    Constitutional: Resting in bed in the right lateral decubitus position, patient is somnolent but in NAD, awakens intermittently to voice Eyes: PERRL, lids and conjunctivae normal ENMT: Mucous membranes are dry. Posterior pharynx clear of any exudate or lesions.Normal dentition.  Neck: normal, supple, no masses. Respiratory: clear to auscultation bilaterally, no wheezing, no crackles. Normal respiratory effort. No accessory muscle use.  Cardiovascular: Regular rate and rhythm, no murmurs / rubs / gallops. No extremity edema. 2+ pedal pulses. Abdomen: Mild periumbilical tenderness, no masses palpated. No hepatosplenomegaly. Bowel sounds positive.  Musculoskeletal: no clubbing / cyanosis. No joint deformity upper and lower extremities. Good ROM, no contractures. Normal muscle tone.  Skin: no rashes, lesions, ulcers. No induration Neurologic: CN 2-12 grossly intact. Sensation intact, Strength 5/5 in all 4.  Psychiatric: Very somnolent but arouses intermittently to voice  Labs on Admission: I have personally reviewed following labs and imaging studies  CBC: Recent Labs  Lab 10/03/18 1542  WBC 7.6  HGB 13.8  HCT 40.1  MCV 87.4  PLT 299   Basic Metabolic Panel: Recent Labs  Lab 10/03/18 1542  NA 139  K 3.6  CL 107  CO2 16*  GLUCOSE 132*  BUN 10  CREATININE 0.76  CALCIUM 9.3   GFR: CrCl cannot be calculated (Unknown ideal weight.). Liver Function Tests: Recent Labs  Lab 10/03/18 1542  AST 23  ALT 16  ALKPHOS 90  BILITOT 0.6  PROT 8.2*  ALBUMIN 4.0   Recent Labs  Lab 10/03/18 1542  LIPASE 32   No results for input(s): AMMONIA in the last 168 hours. Coagulation  Profile: No results for input(s): INR, PROTIME in the last 168 hours. Cardiac Enzymes: No results for input(s): CKTOTAL, CKMB, CKMBINDEX, TROPONINI in the last 168 hours. BNP (last 3 results) No results for input(s): PROBNP in the last 8760 hours. HbA1C: No results for input(s): HGBA1C in the last 72 hours. CBG: No results for input(s): GLUCAP in the last 168 hours. Lipid Profile: No results for input(s): CHOL, HDL, LDLCALC, TRIG, CHOLHDL, LDLDIRECT in the last 72 hours. Thyroid Function Tests: No results for input(s): TSH, T4TOTAL, FREET4, T3FREE, THYROIDAB in the last 72 hours. Anemia Panel: No results for input(s): VITAMINB12, FOLATE, FERRITIN, TIBC, IRON, RETICCTPCT in the last 72 hours. Urine analysis:    Component Value Date/Time   COLORURINE YELLOW 10/03/2018 2306   APPEARANCEUR CLEAR 10/03/2018 2306   LABSPEC 1.015 10/03/2018 2306   PHURINE 8.0 10/03/2018 2306   GLUCOSEU NEGATIVE 10/03/2018 2306   HGBUR NEGATIVE 10/03/2018 2306   BILIRUBINUR NEGATIVE 10/03/2018 2306   KETONESUR NEGATIVE 10/03/2018 2306   PROTEINUR NEGATIVE 10/03/2018 2306   NITRITE NEGATIVE 10/03/2018 2306   LEUKOCYTESUR NEGATIVE 10/03/2018 2306    Radiological Exams on Admission: Ct Abdomen Pelvis W Contrast  Result Date: 10/03/2018 CLINICAL DATA:  Diffuse abdominal pain  and fever. EXAM: CT ABDOMEN AND PELVIS WITH CONTRAST TECHNIQUE: Multidetector CT imaging of the abdomen and pelvis was performed using the standard protocol following bolus administration of intravenous contrast. CONTRAST:  OMNIPAQUE IOHEXOL 300 MG/ML  SOLN COMPARISON:  April 15, 2018 FINDINGS: Lower chest: The lung bases are clear. The heart size is normal. Hepatobiliary: The liver is normal. Normal gallbladder.Again identified is common bile duct dilatation, similar to prior studies. Pancreas: Normal contours without ductal dilatation. No peripancreatic fluid collection. Spleen: No splenic laceration or hematoma. Adrenals/Urinary  Tract: --Adrenal glands: The right adrenal gland is unremarkable. There is stable nodularity of the left adrenal gland. --Right kidney/ureter: No hydronephrosis or perinephric hematoma. --Left kidney/ureter: No hydronephrosis or perinephric hematoma. --Urinary bladder: Unremarkable. Stomach/Bowel: --Stomach/Duodenum: No hiatal hernia or other gastric abnormality. Normal duodenal course and caliber. --Small bowel: No dilatation or inflammation. --Colon: There are scattered colonic diverticula without CT evidence of diverticulitis. --Appendix: Normal. Vascular/Lymphatic: Atherosclerotic calcification is present within the non-aneurysmal abdominal aorta, without hemodynamically significant stenosis. --No retroperitoneal lymphadenopathy. --No mesenteric lymphadenopathy. --No pelvic or inguinal lymphadenopathy. Reproductive: Unremarkable Other: No ascites or free air. The abdominal wall is normal. Musculoskeletal. There are stable postsurgical changes of the lumbar spine. There is no acute displaced fracture. IMPRESSION: 1. No acute intra-abdominal abnormality detected. 2. Chronic changes as above, not significantly changed from 04/15/2018 CT and pelvis. Electronically Signed   By: Katherine Mantle M.D.   On: 10/03/2018 23:43    EKG: Independently reviewed. Sinus rhythm with LVH, wandering leads.  Increased voltage new compared to prior.  Assessment/Plan Principal Problem:   Intractable nausea and vomiting Active Problems:   Chronic back pain  Shayann Garbutt is a 58 y.o. female with medical history significant for rheumatoid arthritis, chronic back pain, and suspected cyclical vomiting syndrome who is admitted with intractable nausea vomiting.   Intractable nausea and vomiting: Patient recurrent episodes and nonrevealing prior work-up.  Suspect due to cyclic vomiting syndrome.  CT abdomen/pelvis with contrast negative for acute abnormality.  Will continue supportive care. -Continue maintenance IV fluids  overnight -Continue IV Phenergan as needed -Advance diet as tolerated  Chronic back pain: Continue home OxyIR 15 mg every 6 hours only as needed and tolerated by mouth.  [place] controlled substance database is reviewed and appears appropriate with last prescription written and filled on 09/19/2018.  Rheumatoid arthritis: Patient with history of RF positive rheumatoid arthritis.  Appears to be untreated as she was unable to afford Biologics in the past.  Will need to follow-up with rheumatology as an outpatient.  DVT prophylaxis: Lovenox Code Status: Full code Family Communication: None present on admission Disposition Plan: Pending clinical progress Consults called: None Admission status: Observation   Darreld Mclean MD [facility]  If 7PM-7AM, please contact night-coverage [email or web address]  10/04/2018, 12:09 AM

## 2018-10-04 NOTE — Progress Notes (Signed)
PROGRESS NOTE  Kristen Ramos PZW:[id number] DOB: [birth year] DOA: 10/03/2018 PCP: Casimer Lanius, MD  HPI/Recap of past 24 hours:  Still dry heaving, not able to tolerate clears Denies ab pain Reports chronic back pain  Assessment/Plan: Principal Problem:   Intractable nausea and vomiting Active Problems:   Chronic back pain   Intractable nausea and vomiting: Patient recurrent episodes and nonrevealing prior work-up.  Suspect due to cyclic vomiting syndrome.  CT abdomen/pelvis with contrast negative for acute abnormality.   -Per chart review, patient underwent EGD in 04/2018 due to n/v which showed Localized mildly erythematous mucosa without bleeding was found in the gastric antrum, otherwise unremarkable" -not able to tolerate clears,will  Keep npo, get gastric emptying study - Continue maintenance IV fluids, schedule reglan -change oral protonix to iv  Consider GI consult if symptom persists  Hypokalemia: replace k, check mag  Impaired fasting blood glucose, will check a1c  Chronic back pain, continue home meds  Rheumatoid arthritis: Patient with history of RF positive rheumatoid arthritis.  Appears to be untreated as she was unable to afford Biologics in the past.  Will need to follow-up with rheumatology as an outpatient.  Code Status: full  Family Communication: patient   Disposition Plan: not ready to discharge, remain npo, not able to tolerate oral intake   Consultants:  none  Procedures:  none  Antibiotics:  none   Objective: BP (!) 158/69 (BP Location: Right Arm)    Pulse 79    Temp 99.6 F (37.6 C) (Oral)    Resp 20    Ht 5\' 6"  (1.676 m)    Wt 86.2 kg    SpO2 98%    BMI 30.67 kg/m   Intake/Output Summary (Last 24 hours) at 10/04/2018 1420 Last data filed at 10/04/2018 0600 Gross per 24 hour  Intake 355.7 ml  Output 0 ml  Net 355.7 ml   Filed Weights   10/04/18 0210  Weight: 86.2 kg    Exam: Patient is examined daily including today on  10/04/2018, exams remain the same as of yesterday except that has changed    General:  She does not look comfortable   Cardiovascular: RRR  Respiratory: CTABL  Abdomen: Soft/ND/NT, positive BS  Musculoskeletal: No Edema  Neuro: alert, oriented   Data Reviewed: Basic Metabolic Panel: Recent Labs  Lab 10/03/18 1542 10/04/18 0535  NA 139 136  K 3.6 3.3*  CL 107 104  CO2 16* 21*  GLUCOSE 132* 137*  BUN 10 9  CREATININE 0.76 0.59  CALCIUM 9.3 8.9   Liver Function Tests: Recent Labs  Lab 10/03/18 1542  AST 23  ALT 16  ALKPHOS 90  BILITOT 0.6  PROT 8.2*  ALBUMIN 4.0   Recent Labs  Lab 10/03/18 1542  LIPASE 32   No results for input(s): AMMONIA in the last 168 hours. CBC: Recent Labs  Lab 10/03/18 1542 10/04/18 0535  WBC 7.6 9.7  HGB 13.8 13.1  HCT 40.1 40.3  MCV 87.4 89.0  PLT 354 333   Cardiac Enzymes:   No results for input(s): CKTOTAL, CKMB, CKMBINDEX, TROPONINI in the last 168 hours. BNP (last 3 results) No results for input(s): BNP in the last 8760 hours.  ProBNP (last 3 results) No results for input(s): PROBNP in the last 8760 hours.  CBG: No results for input(s): GLUCAP in the last 168 hours.  Recent Results (from the past 240 hour(s))  SARS Coronavirus 2 (CEPHEID - Performed in [facility] hospital lab), [facility]  Status: None   Collection Time: 10/03/18 11:57 PM   Specimen: Nasopharyngeal Swab  Result Value Ref Range Status   SARS Coronavirus 2 NEGATIVE NEGATIVE Final    Comment: (NOTE) If result is NEGATIVE SARS-CoV-2 target nucleic acids are NOT DETECTED. The SARS-CoV-2 RNA is generally detectable in upper and lower  respiratory specimens during the acute phase of infection. The lowest  concentration of SARS-CoV-2 viral copies this assay can detect is 250  copies / mL. A negative result does not preclude SARS-CoV-2 infection  and should not be used as the sole basis for treatment or other  patient management decisions.  A  negative result may occur with  improper specimen collection / handling, submission of specimen other  than nasopharyngeal swab, presence of viral mutation(s) within the  areas targeted by this assay, and inadequate number of viral copies  (<250 copies / mL). A negative result must be combined with clinical  observations, patient history, and epidemiological information. If result is POSITIVE SARS-CoV-2 target nucleic acids are DETECTED. The SARS-CoV-2 RNA is generally detectable in upper and lower  respiratory specimens dur ing the acute phase of infection.  Positive  results are indicative of active infection with SARS-CoV-2.  Clinical  correlation with patient history and other diagnostic information is  necessary to determine patient infection status.  Positive results do  not rule out bacterial infection or co-infection with other viruses. If result is PRESUMPTIVE POSTIVE SARS-CoV-2 nucleic acids MAY BE PRESENT.   A presumptive positive result was obtained on the submitted specimen  and confirmed on repeat testing.  While 2019 novel coronavirus  (SARS-CoV-2) nucleic acids may be present in the submitted sample  additional confirmatory testing may be necessary for epidemiological  and / or clinical management purposes  to differentiate between  SARS-CoV-2 and other Sarbecovirus currently known to infect humans.  If clinically indicated additional testing with an alternate test  methodology [phone]) is advised. The SARS-CoV-2 RNA is generally  detectable in upper and lower respiratory sp ecimens during the acute  phase of infection. The expected result is Negative. Fact Sheet for Patients:  StrictlyIdeas.no Fact Sheet for Healthcare Providers: BankingDealers.co.za This test is not yet approved or cleared by the [place] FDA and has been authorized for detection and/or diagnosis of SARS-CoV-2 by FDA under an Emergency Use  Authorization (EUA).  This EUA will remain in effect (meaning this test can be used) for the duration of the COVID-19 declaration under Section 564(b)(1) of the Act, 21 U.S.C. section 360bbb-3(b)(1), unless the authorization is terminated or revoked sooner. Performed at [facility], [place] [address] [place], [place] [address]      Studies: Ct Abdomen Pelvis W Contrast  Result Date: 10/03/2018 CLINICAL DATA:  Diffuse abdominal pain and fever. EXAM: CT ABDOMEN AND PELVIS WITH CONTRAST TECHNIQUE: Multidetector CT imaging of the abdomen and pelvis was performed using the standard protocol following bolus administration of intravenous contrast. CONTRAST:  186mL OMNIPAQUE IOHEXOL 300 MG/ML  SOLN COMPARISON:  April 15, 2018 FINDINGS: Lower chest: The lung bases are clear. The heart size is normal. Hepatobiliary: The liver is normal. Normal gallbladder.Again identified is common bile duct dilatation, similar to prior studies. Pancreas: Normal contours without ductal dilatation. No peripancreatic fluid collection. Spleen: No splenic laceration or hematoma. Adrenals/Urinary Tract: --Adrenal glands: The right adrenal gland is unremarkable. There is stable nodularity of the left adrenal gland. --Right kidney/ureter: No hydronephrosis or perinephric hematoma. --Left kidney/ureter: No hydronephrosis or perinephric hematoma. --Urinary bladder: Unremarkable.  Stomach/Bowel: --Stomach/Duodenum: No hiatal hernia or other gastric abnormality. Normal duodenal course and caliber. --Small bowel: No dilatation or inflammation. --Colon: There are scattered colonic diverticula without CT evidence of diverticulitis. --Appendix: Normal. Vascular/Lymphatic: Atherosclerotic calcification is present within the non-aneurysmal abdominal aorta, without hemodynamically significant stenosis. --No retroperitoneal lymphadenopathy. --No mesenteric lymphadenopathy. --No pelvic or inguinal lymphadenopathy. Reproductive:  Unremarkable Other: No ascites or free air. The abdominal wall is normal. Musculoskeletal. There are stable postsurgical changes of the lumbar spine. There is no acute displaced fracture. IMPRESSION: 1. No acute intra-abdominal abnormality detected. 2. Chronic changes as above, not significantly changed from 04/15/2018 CT and pelvis. Electronically Signed   By: Katherine Mantlehristopher  Green M.D.   On: 10/03/2018 23:43    Scheduled Meds:  enoxaparin (LOVENOX) injection  40 mg Subcutaneous Daily   pantoprazole  40 mg Oral Daily    Continuous Infusions:  lactated ringers     potassium chloride 10 mEq (10/04/18 1321)     Time spent: 35mins I have personally reviewed and interpreted on  10/04/2018 daily labs, imagings as discussed above under date review session and assessment and plans.  I reviewed all nursing notes, pharmacy notes,  vitals, pertinent old records  I have discussed plan of care as described above with RN , patient on 10/04/2018   Albertine GratesFang Trumaine Wimer MD, PhD  [facility] Pager [phone]. If 7PM-7AM, please contact night-coverage at [email or web address], password [email or web address] 10/04/2018, 2:20 PM  LOS: 0 days

## 2018-10-05 LAB — BASIC METABOLIC PANEL
Anion gap: 10 (ref 5–15)
BUN: 10 mg/dL (ref 6–20)
CO2: 22 mmol/L (ref 22–32)
Calcium: 8.8 mg/dL — ABNORMAL LOW (ref 8.9–10.3)
Chloride: 102 mmol/L (ref 98–111)
Creatinine, Ser: 0.62 mg/dL (ref 0.44–1.00)
GFR calc Af Amer: >60 mL/min (ref >60)
GFR calc non Af Amer: >60 mL/min (ref >60)
Glucose, Bld: 122 mg/dL — ABNORMAL HIGH (ref 70–99)
Potassium: 3.5 mmol/L (ref 3.5–5.1)
Sodium: 134 mmol/L — ABNORMAL LOW (ref 135–145)

## 2018-10-05 LAB — CBC WITH DIFFERENTIAL/PLATELET
Abs Immature Granulocytes: 0.03 10*3/uL (ref 0.00–0.07)
Basophils Absolute: 0 10*3/uL (ref 0.0–0.1)
Basophils Relative: 0 %
Eosinophils Absolute: 0 10*3/uL (ref 0.0–0.5)
Eosinophils Relative: 0 %
HCT: 42.4 % (ref 36.0–46.0)
Hemoglobin: 14.2 g/dL (ref 12.0–15.0)
Immature Granulocytes: 0 %
Lymphocytes Relative: 16 %
Lymphs Abs: 1.5 10*3/uL (ref 0.7–4.0)
MCH: 29.6 pg (ref 26.0–34.0)
MCHC: 33.5 g/dL (ref 30.0–36.0)
MCV: 88.5 fL (ref 80.0–100.0)
Monocytes Absolute: 0.5 10*3/uL (ref 0.1–1.0)
Monocytes Relative: 6 %
Neutro Abs: 7.2 10*3/uL (ref 1.7–7.7)
Neutrophils Relative %: 78 %
Platelets: 336 10*3/uL (ref 150–400)
RBC: 4.79 MIL/uL (ref 3.87–5.11)
RDW: 13.3 % (ref 11.5–15.5)
WBC: 9.2 10*3/uL (ref 4.0–10.5)
nRBC: 0 % (ref 0.0–0.2)

## 2018-10-05 LAB — HEMOGLOBIN A1C
Hgb A1c MFr Bld: 5.9 % — ABNORMAL HIGH (ref 4.8–5.6)
Mean Plasma Glucose: 122.63 mg/dL

## 2018-10-05 LAB — MAGNESIUM: Magnesium: 2.3 mg/dL (ref 1.7–2.4)

## 2018-10-05 LAB — GLUCOSE, CAPILLARY: Glucose-Capillary: 87 mg/dL (ref 70–99)

## 2018-10-05 MED ORDER — POTASSIUM CHLORIDE 10 MEQ/100ML IV SOLN
10.0000 meq | INTRAVENOUS | Status: AC
  Administered 2018-10-05 (×4): 10 meq via INTRAVENOUS
  Filled 2018-10-05 (×4): qty 100

## 2018-10-05 MED ORDER — BOOST / RESOURCE BREEZE PO LIQD CUSTOM
1.0000 | Freq: Three times a day (TID) | ORAL | Status: DC
  Administered 2018-10-05 – 2018-10-07 (×6): 1 via ORAL

## 2018-10-05 NOTE — Plan of Care (Signed)
  Problem: Education: Goal: Knowledge of General Education information will improve Description: Including pain rating scale, medication(s)/side effects and non-pharmacologic comfort measures Outcome: Progressing   Problem: Clinical Measurements: Goal: Will remain free from infection Outcome: Progressing   Problem: Coping: Goal: Level of anxiety will decrease Outcome: Progressing   Problem: Elimination: Goal: Will not experience complications related to urinary retention Outcome: Progressing   Problem: Safety: Goal: Ability to remain free from injury will improve Outcome: Progressing   Problem: Skin Integrity: Goal: Risk for impaired skin integrity will decrease Outcome: Progressing   

## 2018-10-05 NOTE — Progress Notes (Signed)
Pt states ABD pain throbbing and sharp, PRN pain med given as ordered along with scheduled Reglan. SRP, RN

## 2018-10-05 NOTE — Progress Notes (Signed)
Initial Nutrition Assessment  DOCUMENTATION CODES:   Obesity unspecified  INTERVENTION:   Once diet advanced: Provide Boost Breeze po TID, each supplement provides 250 kcal and 9 grams of protein  NUTRITION DIAGNOSIS:   Inadequate oral intake related to nausea, vomiting as evidenced by per patient/family report, NPO status.  GOAL:   Patient will meet greater than or equal to 90% of their needs  MONITOR:   Diet advancement, Labs, Weight trends, I & O's  REASON FOR ASSESSMENT:   Malnutrition Screening Tool    ASSESSMENT:   58 y.o. female with medical history significant for rheumatoid arthritis, chronic back pain, and suspected cyclical vomiting syndrome who presents to the ED for further evaluation of nausea and vomiting.  **RD working remotely**  Patient currently NPO given continued N/V and abdominal pain. Pt reports symptoms began 7/23. Pt has not been able to eat anything since. Per MD note, GI to see pt. Once diet advanced, recommend Boost Breeze supplements.  Per weight records in care everywhere, pt weighed 200 lbs in November 2019. Pt has lost 10 lbs since that time, insignificant for time frame.   Medications: IV Reglan every 8 hours, Lactated Ringers infusion, IV KCl  Labs reviewed:  Low Na  NUTRITION - FOCUSED PHYSICAL EXAM:  Unable to perform -working remotely.  Diet Order:   Diet Order            Diet clear liquid Room service appropriate? Yes; Fluid consistency: Thin  Diet effective now              EDUCATION NEEDS:   No education needs have been identified at this time  Skin:  Skin Assessment: Reviewed RN Assessment  Last BM:  7/25  Height:   Ht Readings from Last 1 Encounters:  10/04/18 5\' 6"  (1.676 m)    Weight:   Wt Readings from Last 1 Encounters:  10/04/18 86.2 kg    Ideal Body Weight:  59.1 kg  BMI:  Body mass index is 30.67 kg/m.  Estimated Nutritional Needs:   Kcal:  1650-1850  Protein:  70-80g  Fluid:   1.8L/day  Clayton Bibles, MS, RD, LDN [place] [facility] Pager: [phone] After Hours Pager: [phone]

## 2018-10-05 NOTE — Progress Notes (Signed)
PROGRESS NOTE  Kristen Ramos HYQ:[id number] DOB: [birth year] DOA: 10/03/2018 PCP: Casimer LaniusAryal, Govinda, MD  HPI/Recap of past 24 hours:  Continue c/o ab pain, dry heaving, she does not eat much  Reports chronic back pain  Assessment/Plan: Principal Problem:   Intractable nausea and vomiting Active Problems:   Chronic back pain   Hypokalemia   Impaired fasting blood sugar   Intractable nausea and vomiting, ab pain: -Patient recurrent episodes and nonrevealing prior work-up.  Suspect due to cyclic vomiting syndrome.  CT abdomen/pelvis with contrast negative for acute abnormality.   -Per chart review, patient underwent EGD in 04/2018 due to n/v which showed Localized mildly erythematous mucosa without bleeding was found in the gastric antrum, otherwise unremarkable" -gastric emptying study in 07/2018 "Near complete emptying from the stomach after 1 hour. There is no evidence suggesting gastric paresis. The clinical significance of this rather rapid emptying of food material from the stomach is uncertain." -she does not eat,  Continue maintenance IV fluids, schedule reglan -change oral protonix to iv -will check anti TTG IgA to rule out celiac disease  -case discussed with [facility] Dr Dulce Sellarutlaw, gi will see patient in am.   Hypokalemia: remain low, continue to replace k,  mag 2.3  Impaired fasting blood glucose,  a1c 5.9  Chronic back pain, continue home meds  Rheumatoid arthritis: Patient with history of RF positive rheumatoid arthritis.  Appears to be untreated as she was unable to afford Biologics in the past.  Will need to follow-up with rheumatology as an outpatient.  Body mass index is 30.67 kg/m.   Code Status: full  Family Communication: patient   Disposition Plan: not ready to discharge, not able to tolerate oral intake   Consultants:  [facility]  Procedures:  none  Antibiotics:  none   Objective: BP (!) 156/84 (BP Location: Left Arm)   Pulse 74   Temp 98.4  F (36.9 C) (Oral)   Resp 17   Ht 5\' 6"  (1.676 m)   Wt 86.2 kg   SpO2 99%   BMI 30.67 kg/m   Intake/Output Summary (Last 24 hours) at 10/05/2018 1438 Last data filed at 10/05/2018 0200 Gross per 24 hour  Intake 2124.5 ml  Output -  Net 2124.5 ml   Filed Weights   10/04/18 0210  Weight: 86.2 kg    Exam: Patient is examined daily including today on 10/05/2018, exams remain the same as of yesterday except that has changed    General:  She does not look comfortable   Cardiovascular: RRR  Respiratory: CTABL  Abdomen: Soft/ND/NT, positive BS  Musculoskeletal: No Edema  Neuro: alert, oriented   Data Reviewed: Basic Metabolic Panel: Recent Labs  Lab 10/03/18 1542 10/04/18 0535 10/05/18 0524  NA 139 136 134*  K 3.6 3.3* 3.5  CL 107 104 102  CO2 16* 21* 22  GLUCOSE 132* 137* 122*  BUN 10 9 10   CREATININE 0.76 0.59 0.62  CALCIUM 9.3 8.9 8.8*  MG  --   --  2.3   Liver Function Tests: Recent Labs  Lab 10/03/18 1542  AST 23  ALT 16  ALKPHOS 90  BILITOT 0.6  PROT 8.2*  ALBUMIN 4.0   Recent Labs  Lab 10/03/18 1542  LIPASE 32   No results for input(s): AMMONIA in the last 168 hours. CBC: Recent Labs  Lab 10/03/18 1542 10/04/18 0535 10/05/18 0524  WBC 7.6 9.7 9.2  NEUTROABS  --   --  7.2  HGB 13.8 13.1 14.2  HCT 40.1 40.3 42.4  MCV 87.4 89.0 88.5  PLT 354 333 336   Cardiac Enzymes:   No results for input(s): CKTOTAL, CKMB, CKMBINDEX, TROPONINI in the last 168 hours. BNP (last 3 results) No results for input(s): BNP in the last 8760 hours.  ProBNP (last 3 results) No results for input(s): PROBNP in the last 8760 hours.  CBG: No results for input(s): GLUCAP in the last 168 hours.  Recent Results (from the past 240 hour(s))  SARS Coronavirus 2 (CEPHEID - Performed in [facility] hospital lab), Hosp Order     Status: None   Collection Time: 10/03/18 11:57 PM   Specimen: Nasopharyngeal Swab  Result Value Ref Range Status   SARS Coronavirus 2  NEGATIVE NEGATIVE Final    Comment: (NOTE) If result is NEGATIVE SARS-CoV-2 target nucleic acids are NOT DETECTED. The SARS-CoV-2 RNA is generally detectable in upper and lower  respiratory specimens during the acute phase of infection. The lowest  concentration of SARS-CoV-2 viral copies this assay can detect is 250  copies / mL. A negative result does not preclude SARS-CoV-2 infection  and should not be used as the sole basis for treatment or other  patient management decisions.  A negative result may occur with  improper specimen collection / handling, submission of specimen other  than nasopharyngeal swab, presence of viral mutation(s) within the  areas targeted by this assay, and inadequate number of viral copies  (<250 copies / mL). A negative result must be combined with clinical  observations, patient history, and epidemiological information. If result is POSITIVE SARS-CoV-2 target nucleic acids are DETECTED. The SARS-CoV-2 RNA is generally detectable in upper and lower  respiratory specimens dur ing the acute phase of infection.  Positive  results are indicative of active infection with SARS-CoV-2.  Clinical  correlation with patient history and other diagnostic information is  necessary to determine patient infection status.  Positive results do  not rule out bacterial infection or co-infection with other viruses. If result is PRESUMPTIVE POSTIVE SARS-CoV-2 nucleic acids MAY BE PRESENT.   A presumptive positive result was obtained on the submitted specimen  and confirmed on repeat testing.  While 2019 novel coronavirus  (SARS-CoV-2) nucleic acids may be present in the submitted sample  additional confirmatory testing may be necessary for epidemiological  and / or clinical management purposes  to differentiate between  SARS-CoV-2 and other Sarbecovirus currently known to infect humans.  If clinically indicated additional testing with an alternate test  methodology [phone])  is advised. The SARS-CoV-2 RNA is generally  detectable in upper and lower respiratory sp ecimens during the acute  phase of infection. The expected result is Negative. Fact Sheet for Patients:  BoilerBrush.com.cy Fact Sheet for Healthcare Providers: https://pope.com/ This test is not yet approved or cleared by the [place] FDA and has been authorized for detection and/or diagnosis of SARS-CoV-2 by FDA under an Emergency Use Authorization (EUA).  This EUA will remain in effect (meaning this test can be used) for the duration of the COVID-19 declaration under Section 564(b)(1) of the Act, 21 U.S.C. section 360bbb-3(b)(1), unless the authorization is terminated or revoked sooner. Performed at [facility], [address], [place], [place] [address]      Studies: No results found.  Scheduled Meds: . enoxaparin (LOVENOX) injection  40 mg Subcutaneous Daily  . metoCLOPramide (REGLAN) injection  5 mg Intravenous Q8H  . pantoprazole (PROTONIX) IV  40 mg Intravenous Q12H    Continuous Infusions: .  lactated ringers 75 mL/hr at 10/05/18 0200  . potassium chloride       Time spent: 13mins, case discussed with Gi Dr Paulita Fujita I have personally reviewed and interpreted on  10/05/2018 daily labs, imagings as discussed above under date review session and assessment and plans.  I reviewed all nursing notes, pharmacy notes,  vitals, pertinent old records  I have discussed plan of care as described above with RN , patient on 10/05/2018   Florencia Reasons MD, PhD  [facility] Pager [phone]. If 7PM-7AM, please contact night-coverage at [email or web address], password [facility] 10/05/2018, 2:38 PM  LOS: 1 day

## 2018-10-06 DIAGNOSIS — M549 Dorsalgia, unspecified: Secondary | ICD-10-CM

## 2018-10-06 DIAGNOSIS — G8929 Other chronic pain: Secondary | ICD-10-CM

## 2018-10-06 LAB — BASIC METABOLIC PANEL
Anion gap: 10 (ref 5–15)
BUN: 11 mg/dL (ref 6–20)
CO2: 22 mmol/L (ref 22–32)
Calcium: 8.4 mg/dL — ABNORMAL LOW (ref 8.9–10.3)
Chloride: 106 mmol/L (ref 98–111)
Creatinine, Ser: 0.58 mg/dL (ref 0.44–1.00)
GFR calc Af Amer: >60 mL/min (ref >60)
GFR calc non Af Amer: >60 mL/min (ref >60)
Glucose, Bld: 105 mg/dL — ABNORMAL HIGH (ref 70–99)
Potassium: 3.6 mmol/L (ref 3.5–5.1)
Sodium: 138 mmol/L (ref 135–145)

## 2018-10-06 NOTE — Progress Notes (Signed)
PROGRESS NOTE  Kristen Ramos ASN:[id number] DOB: 1960/10/24 DOA: 10/03/2018 PCP: Lahoma Rocker, MD  Subjective/ Recap of past 24hrs: Alert, seen in room, states the N/V is slowly improving.  Taking clears today. Seen by GI this am.     Assessment/Plan: Principal Problem:   Intractable nausea and vomiting Active Problems:   Chronic back pain   Hypokalemia   Impaired fasting blood sugar    Hospital Course  Intractable nausea and vomiting, ab pain: -Patient recurrent episodes and nonrevealing prior work-up.  Suspect due to cyclic vomiting syndrome.  CT abdomen/pelvis with contrast negative for acute abnormality.   -Per chart review, patient underwent EGD in 04/2018 due to n/v which showed Localized mildly erythematous mucosa without bleeding was found in the gastric antrum, otherwise unremarkable" -gastric emptying study in 07/2018 "Near complete emptying from the stomach after 1 hour. There is no evidence suggesting gastric paresis. 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  Appears to be untreated as she was unable to afford Biologics in the past.  Will need to follow-up with rheumatology as an outpatient.  Body mass index is 30.67 kg/m.   Code Status: full Family Communication: patient  Disposition Plan: not ready to discharge yet Status: inpatient, med-surg  Kelly Splinter MD  [facility]  pgr [phone] 10/06/2018, 4:41 PM   Consultants:  [facility]  Procedures:   none  Antibiotics:  none   Objective: BP 126/78 (BP Location: Right Arm)   Pulse 61   Temp 97.7 F (36.5 C) (Oral)   Resp 17   Ht 5\' 6"  (1.676 m)   Wt 86.2 kg   SpO2 99%   BMI 30.67 kg/m   Intake/Output Summary (Last 24 hours) at 10/06/2018 1636 Last data filed at 10/06/2018 0840 Gross per 24 hour  Intake 1363.14 ml  Output 3100 ml  Net -1736.86 ml   Filed Weights   10/04/18 0210  Weight: 86.2 kg    Exam: Patient is examined daily including today on 10/06/2018, exams remain the same as of yesterday except that has changed    General:  She does not look uncomfortable today  Cardiovascular: RRR  Respiratory: CTABL  Abdomen: Soft/ND/NT, positive BS  Musculoskeletal: No Edema  Neuro: alert, oriented   Data Reviewed: Basic Metabolic Panel: Recent Labs  Lab 10/03/18 1542 10/04/18 0535 10/05/18 0524 10/06/18 0444  NA 139 136 134* 138  K 3.6 3.3* 3.5 3.6  CL 107 104 102 106  CO2 16* 21* 22 22  GLUCOSE 132* 137* 122* 105*  BUN 10 9 10 11   CREATININE 0.76 0.59 0.62 0.58  CALCIUM 9.3 8.9 8.8* 8.4*  MG  --   --  2.3  --    Liver Function Tests: Recent Labs  Lab 10/03/18 1542  AST 23  ALT 16  ALKPHOS 90  BILITOT 0.6  PROT 8.2*  ALBUMIN 4.0   Recent Labs  Lab 10/03/18 1542  LIPASE 32   No results for input(s): AMMONIA in the last  168 hours. CBC: Recent Labs  Lab 10/03/18 1542 10/04/18 0535 10/05/18 0524  WBC 7.6 9.7 9.2  NEUTROABS  --   --  7.2  HGB 13.8 13.1 14.2  HCT 40.1 40.3 42.4  MCV 87.4 89.0 88.5  PLT 354 333 336   Cardiac Enzymes:   No results for input(s): CKTOTAL, CKMB, CKMBINDEX, TROPONINI in the last 168 hours. BNP (last 3 results) No results for input(s): BNP in the last 8760 hours.  ProBNP (last 3 results) No results for input(s): PROBNP in the last 8760 hours.  CBG: Recent Labs  Lab 10/05/18 2036  GLUCAP 87    Recent Results (from the past 240 hour(s))  SARS Coronavirus 2 (CEPHEID - Performed in [facility]  hospital lab), Hosp Order     Status: None   Collection Time: 10/03/18 11:57 PM   Specimen: Nasopharyngeal Swab  Result Value Ref Range Status   SARS Coronavirus 2 NEGATIVE NEGATIVE Final    Comment: (NOTE) If result is NEGATIVE SARS-CoV-2 target nucleic acids are NOT DETECTED. The SARS-CoV-2 RNA is generally detectable in upper and lower  respiratory specimens during the acute phase of infection. The lowest  concentration of SARS-CoV-2 viral copies this assay can detect is 250  copies / mL. A negative result does not preclude SARS-CoV-2 infection  and should not be used as the sole basis for treatment or other  patient management decisions.  A negative result may occur with  improper specimen collection / handling, submission of specimen other  than nasopharyngeal swab, presence of viral mutation(s) within the  areas targeted by this assay, and inadequate number of viral copies  (<250 copies / mL). A negative result must be combined with clinical  observations, patient history, and epidemiological information. If result is POSITIVE SARS-CoV-2 target nucleic acids are DETECTED. The SARS-CoV-2 RNA is generally detectable in upper and lower  respiratory specimens dur ing the acute phase of infection.  Positive  results are indicative of active infection with SARS-CoV-2.  Clinical  correlation with patient history and other diagnostic information is  necessary to determine patient infection status.  Positive results do  not rule out bacterial infection or co-infection with other viruses. If result is PRESUMPTIVE POSTIVE SARS-CoV-2 nucleic acids MAY BE PRESENT.   A presumptive positive result was obtained on the submitted specimen  and confirmed on repeat testing.  While 2019 novel coronavirus  (SARS-CoV-2) nucleic acids may be present in the submitted sample  additional confirmatory testing may be necessary for epidemiological  and / or clinical management purposes  to differentiate  between  SARS-CoV-2 and other Sarbecovirus currently known to infect humans.  If clinically indicated additional testing with an alternate test  methodology [phone]) is advised. The SARS-CoV-2 RNA is generally  detectable in upper and lower respiratory sp ecimens during the acute  phase of infection. The expected result is Negative. Fact Sheet for Patients:  BoilerBrush.com.cy Fact Sheet for Healthcare Providers: https://pope.com/ This test is not yet approved or cleared by the [place] FDA and has been authorized for detection and/or diagnosis of SARS-CoV-2 by FDA under an Emergency Use Authorization (EUA).  This EUA will remain in effect (meaning this test can be used) for the duration of the COVID-19 declaration under Section 564(b)(1) of the Act, 21 U.S.C. section 360bbb-3(b)(1), unless the authorization is terminated or revoked sooner. Performed at [facility], [address] [place], [place] [address]      Studies: No results found.  Scheduled Meds: .  enoxaparin (LOVENOX) injection  40 mg Subcutaneous Daily  . feeding supplement  1 Container Oral TID BM  . metoCLOPramide (REGLAN) injection  5 mg Intravenous Q8H  . pantoprazole (PROTONIX) IV  40 mg Intravenous Q12H    Continuous Infusions: . lactated ringers 75 mL/hr at 10/06/18 0448      If 7PM-7AM, please contact night-coverage at [email or web address], password [facility] 10/06/2018, 4:36 PM  LOS: 2 days

## 2018-10-06 NOTE — Consult Note (Signed)
Referring Provider:  Dr. Kelly Splinter  Primary Care Physician:  Lahoma Rocker, MD Primary Gastroenterologist:  Althia Forts (has been seen in the hospital as unassigned patient previously by Dr. Therisa Doyne but has not been seen in our office)  Reason for Consultation: Recurrent nausea and vomiting  HPI: Kristen Ramos is a 58 y.o. female who gives a roughly 1 year history of sporadic nausea and vomiting which begins as "phlegm" in her throat and leads to severe gagging.  By her report, she has full-fledged vomiting, not just effortless regurgitation, and this occurs even in the absence of attempted food intake.  The patient has been hospitalized for this previously, and seen in consultation by Dr. Therisa Doyne, in both February and May of this year.  In February, the patient had an upper endoscopy which was normal other than some mild antral gastritis (biopsies were negative for Helicobacter pylori infection, and duodenal biopsies were negative for sprue).  In May, the patient had a gastric emptying scan which showed, if anything, accelerated gastric emptying with almost 100% of ingested food exited from the stomach at 1 hour), the significance of which is unclear.  Denies THC exposure.  In between these episodes of vomiting, which have been occurring every several months, she can actually tolerate food very well.   Past Medical History:  Diagnosis Date  . Arthritis   . Chronic back pain     Past Surgical History:  Procedure Laterality Date  . ABDOMINAL HYSTERECTOMY    . BACK SURGERY    . BIOPSY  04/19/2018   Procedure: BIOPSY;  Surgeon: Ronnette Juniper, MD;  Location: [facility];  Service: Gastroenterology;;  . CESAREAN SECTION    . ESOPHAGOGASTRODUODENOSCOPY (EGD) WITH PROPOFOL N/A 04/19/2018   Procedure: ESOPHAGOGASTRODUODENOSCOPY (EGD) WITH PROPOFOL;  Surgeon: Ronnette Juniper, MD;  Location: [facility];  Service: Gastroenterology;  Laterality: N/A;    Prior to Admission medications   Medication Sig  Start Date End Date Taking? Authorizing Provider  ondansetron (ZOFRAN-ODT) 4 MG disintegrating tablet Take 1 tablet (4 mg total) by mouth every 8 (eight) hours as needed for nausea or vomiting. 07/21/18  Yes Seawell, Jaimie A, DO  oxyCODONE (ROXICODONE) 15 MG immediate release tablet Take 15 mg by mouth 4 (four) times daily as needed for pain.  02/03/18  Yes [provider]  pantoprazole (PROTONIX) 40 MG tablet Take 1 tablet (40 mg total) by mouth daily. 07/21/18  Yes Seawell, Jaimie A, DO  tiZANidine (ZANAFLEX) 4 MG tablet Take 4 mg by mouth 2 (two) times daily as needed for muscle spasms. 09/29/18  Yes [provider]    Current Facility-Administered Medications  Medication Dose Route Frequency Provider Last Rate Last Dose  . acetaminophen (TYLENOL) tablet 650 mg  650 mg Oral Q6H PRN Lenore Cordia, MD       Or  . acetaminophen (TYLENOL) suppository 650 mg  650 mg Rectal Q6H PRN Zada Finders R, MD      . enoxaparin (LOVENOX) injection 40 mg  40 mg Subcutaneous Daily Lenore Cordia, MD   40 mg at 10/05/18 0924  . feeding supplement (BOOST / RESOURCE BREEZE) liquid 1 Container  1 Container Oral TID BM Florencia Reasons, MD   1 Container at 10/05/18 2109  . lactated ringers infusion   Intravenous Continuous Florencia Reasons, MD 75 mL/hr at 10/06/18 0448    . metoCLOPramide (REGLAN) injection 5 mg  5 mg Intravenous Blair Promise, MD   5 mg at 10/06/18 0449  . oxyCODONE (Oxy  IR/ROXICODONE) immediate release tablet 15 mg  15 mg Oral Q6H PRN Charlsie Quest, MD   15 mg at 10/06/18 0446  . pantoprazole (PROTONIX) injection 40 mg  40 mg Intravenous Q12H Albertine Grates, MD   40 mg at 10/05/18 2110    Allergies as of 10/03/2018 - Review Complete 10/03/2018  Allergen Reaction Noted  . Bee venom Swelling 12/13/2017    History reviewed. No pertinent family history.  Social History   Socioeconomic History  . Marital status: Single    Spouse name: Not on file  . Number of children: Not on file  . Years  of education: Not on file  . Highest education level: Not on file  Occupational History  . Not on file  Social Needs  . Financial resource strain: Not on file  . Food insecurity    Worry: Not on file    Inability: Not on file  . Transportation needs    Medical: Not on file    Non-medical: Not on file  Tobacco Use  . Smoking status: Current Every Day Smoker    Types: Cigarettes  . Smokeless tobacco: Never Used  Substance and Sexual Activity  . Alcohol use: No  . Drug use: No  . Sexual activity: Not on file  Lifestyle  . Physical activity    Days per week: Not on file    Minutes per session: Not on file  . Stress: Not on file  Relationships  . Social Musician on phone: Not on file    Gets together: Not on file    Attends religious service: Not on file    Active member of club or organization: Not on file    Attends meetings of clubs or organizations: Not on file    Relationship status: Not on file  . Intimate partner violence    Fear of current or ex partner: Not on file    Emotionally abused: Not on file    Physically abused: Not on file    Forced sexual activity: Not on file  Other Topics Concern  . Not on file  Social History Narrative  . Not on file    Review of Systems: see HPI; chronic back pain, on oxycodone  Physical Exam: Vital signs in last 24 hours: Temp:  [98.9 F (37.2 C)-99.2 F (37.3 C)] 99 F (37.2 C) (07/27 0451) Pulse Rate:  [61-68] 61 (07/27 0451) Resp:  [18-20] 18 (07/27 0451) BP: (120-143)/(70-78) 120/78 (07/27 0451) SpO2:  [98 %-100 %] 100 % (07/27 0451) Last BM Date: 10/04/18(per pt report)    Intake/Output from previous day: 07/26 0701 - 07/27 0700 In: 2415.4 [I.V.:2115.4; IV Piggyback:300] Out: 2250 [Urine:2250] Intake/Output this shift: Total I/O In: -  Out: 850 [Urine:850]  Lab Results: Recent Labs    10/03/18 1542 10/04/18 0535 10/05/18 0524  WBC 7.6 9.7 9.2  HGB 13.8 13.1 14.2  HCT 40.1 40.3 42.4  PLT  354 333 336   BMET Recent Labs    10/04/18 0535 10/05/18 0524 10/06/18 0444  NA 136 134* 138  K 3.3* 3.5 3.6  CL 104 102 106  CO2 21* 22 22  GLUCOSE 137* 122* 105*  BUN 9 10 11   CREATININE 0.59 0.62 0.58  CALCIUM 8.9 8.8* 8.4*   LFT Recent Labs    10/03/18 1542  PROT 8.2*  ALBUMIN 4.0  AST 23  ALT 16  ALKPHOS 90  BILITOT 0.6   PT/INR No results for input(s): LABPROT, INR  in the last 72 hours.  Studies/Results: No results found.  Impression: Most likely, this patient has some form of gastric dysmotility, causing cyclical vomiting  Plan: Trial of frequent small aliquots of clear liquids.  Rationale and instructions discussed with patient.   LOS: 2 days   Katy FitchRobert V Geovanna Simko  10/06/2018, 10:31 AM   Pager [phone] If no answer or after 5 PM call [phone]

## 2018-10-07 DIAGNOSIS — R1013 Epigastric pain: Secondary | ICD-10-CM

## 2018-10-07 LAB — GLIA (IGA/G) + TTG IGA
Antigliadin Abs, IgA: 7 units (ref 0–19)
Gliadin IgG: 2 units (ref 0–19)
Tissue Transglutaminase Ab, IgA: <2 U/mL (ref 0–3)

## 2018-10-07 MED ORDER — ONDANSETRON 4 MG PO TBDP: 4.0000 mg | ORAL_TABLET | Freq: Three times a day (TID) | ORAL | 1 refills | Status: DC | PRN

## 2018-10-07 NOTE — Progress Notes (Signed)
Pt had crackers during the night denies and nausea and no active vomiting. Pt diet changed to Full liquid, if tolerates breakfast will move diet to Soft diet as order notes advance diet as tolerated. SRP,RN

## 2018-10-07 NOTE — Plan of Care (Signed)
  Problem: Education: Goal: Knowledge of General Education information will improve Description: Including pain rating scale, medication(s)/side effects and non-pharmacologic comfort measures Outcome: Progressing   Problem: Health Behavior/Discharge Planning: Goal: Ability to manage health-related needs will improve Outcome: Progressing   Problem: Clinical Measurements: Goal: Will remain free from infection Outcome: Progressing   Problem: Clinical Measurements: Goal: Diagnostic test results will improve Outcome: Progressing   Problem: Activity: Goal: Risk for activity intolerance will decrease Outcome: Progressing   Problem: Nutrition: Goal: Adequate nutrition will be maintained Outcome: Progressing

## 2018-10-07 NOTE — Progress Notes (Signed)
Pt tolerated Full liquid breakfast, no nausea and vomiting noted, pt diet changed to Soft diet. SRP, RN

## 2018-10-07 NOTE — Progress Notes (Signed)
Pt discharged to home , instructions reviewed with patient, acknowledged understanding. SRP, RN

## 2018-10-07 NOTE — Plan of Care (Signed)

## 2018-10-07 NOTE — Progress Notes (Signed)
Pt tolerated freq small aliquots of clr liq, and feels ready to advance to solid food.  She feels she's doing well.  PLAN:  Advance to soft diet, but advised pt to only eat small amounts.  Cleotis Nipper, M.D. Pager [phone] If no answer or after 5 PM call [phone]

## 2018-10-07 NOTE — Discharge Summary (Signed)
Physician Discharge Summary  Patient ID: Kristen Ramos MRN: [id number] DOB/AGE: 03/15/1960 58 y.o.  Admit date: 10/03/2018 Discharge date: 10/07/2018  Admission Diagnoses:   Recurrent nausea/ vomiting   Chronic back pain   Hypokalemia   Impaired fasting blood sugar  Discharge Diagnoses:  Principal Problem:   Cyclical vomiting   Chronic back pain   Abdominal pain   Hypokalemia   Impaired fasting blood sugar   Discharged Condition: good  HPI: Kristen Ramos is a 58 y.o. female with medical history significant for rheumatoid arthritis, chronic back pain, and suspected cyclical vomiting syndrome who presents to the ED for further evaluation of nausea and vomiting Patient states she was in her usual state of health until the morning prior to day of admission when she developed sudden onset of nausea and vomiting around 6 AM.  She reports persistent symptoms and inability to maintain adequate oral intake.  She reports clear emesis without hematemesis or coffee-ground appearance.  She has had associated generalized abdominal discomfort.  She denies any diarrhea, constipation, dysuria, subjective fevers, chills, diaphoresis.  She denies any recent changes in her medications.  She denies any chest pain or dyspnea Prior GI work-up has been unrevealing.  MRCP 04/17/2018 showed persistent CBD dilatation 1.6 cm in maximum diameter and mild intrahepatic duct dilatation without obstructing stone or discrete mass.  Upper endoscopy 04/19/2018 showed normal esophagus, regular Z line, erythematous mucosa in the antrum, normal appearing duodenum and.  Gastric emptying study 07/21/2018 showed near complete emptying from the stomach after 1 hour without evidence to suggest gastroparesis.  ED Course:  Initial vitals showed BP 134/112, pulse 99, RR 15, temp 99.0 Fahrenheit, SPO2 100% on room air.  Rectal temperature was 100.5 Fahrenheit. Labs notable for WBC 7.6, hemoglobin 13.8, platelets 354,000, sodium 139, potassium  3.6, bicarb 16, BUN 10, creatinine 0.76, lipase 32, urinalysis negative for UTI.  UDS is positive for opiates (expected as patient is prescribed oxycodone). SARS-CoV-2 test is obtained and pending.  CT abdomen/pelvis was negative for acute intra-abdominal abnormality. Patient was given 1 L normal saline, IV Haldol 2 mg once, IV morphine 4 mg once, Maalox, Bentyl, Pepcid, Reglan, Zofran, and sucralfate.  Patient has persistent nausea and vomiting therefore the hospitalist service was consulted to admit for further evaluation and management.  Hospital Course  Intractable nausea and vomiting, ab pain: -Patient recurrent episodes and nonrevealing prior work-up. Suspect due to cyclic vomiting syndrome. CT abdomen/pelvis with contrast negative for acute abnormality.  -Per chart review, patient underwent EGD in 04/2018 due to n/v which showed Localized mildly erythematous mucosa without bleeding was found in the gastric antrum, otherwise unremarkable" -gastric emptying study in 07/2018 "Near complete emptying from the stomach after 1 hour. There is no evidence suggesting gastric paresis. 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 Appears to be untreated as she was unable to afford Biologics in the past. Will need to follow-up with rheumatology as an  outpatient.     Vinson Moselle MD  [facility]  pgr [phone] 10/06/2018, 4:41 PM   Consultants:  [facility]  Procedures:  none  Antibiotics: Discharge Exam: Blood pressure (!) 107/56, pulse (!) 57, temperature 98.1 F (36.7 C), temperature source Oral, resp. rate 20, height 5\' 6"  (1.676 m), weight 86.2 kg, SpO2 98 %.  General: patient is calm and interactive  Cardiovascular: RRR  Respiratory: CTABL  Abdomen: Soft/ND/NT, positive BS  Musculoskeletal: No Edema  Neuro: alert, oriented   Disposition: Discharge disposition: 01-Home or Self Care        Allergies as of 10/07/2018      Reactions   Bee Venom Swelling      Medication List    TAKE these medications   ondansetron 4 MG disintegrating tablet Commonly known as: ZOFRAN-ODT Take 1 tablet (4 mg total) by mouth every 8 (eight) hours as needed for up to 120 doses for nausea or vomiting.   oxyCODONE 15 MG immediate release tablet Commonly known as: ROXICODONE Take 15 mg by mouth 4 (four) times daily as needed for pain.   pantoprazole 40 MG tablet Commonly known as: PROTONIX Take 1 tablet (40 mg total) by mouth daily.   tiZANidine 4 MG tablet Commonly known as: ZANAFLEX Take 4 mg by mouth 2 (two) times daily as needed for muscle spasms.      Follow-up Information    10/09/2018, MD. Call.   Specialty: Gastroenterology Why: Make f/u appt w/ GI doctor in 2-4 wks Contact information: [address] [place] [phone]           Signed: [phone] 10/07/2018, 4:39 PM

## 2018-10-07 NOTE — Progress Notes (Signed)
Patient slept good, no vomiting during the shift. C/O mild nausea, getting relief with Reglan.

## 2018-10-22 ENCOUNTER — Other Ambulatory Visit: Payer: Self-pay | Admitting: Internal Medicine

## 2019-06-16 ENCOUNTER — Emergency Department (HOSPITAL_COMMUNITY): Payer: Medicare Other

## 2019-06-16 ENCOUNTER — Other Ambulatory Visit: Payer: Self-pay

## 2019-06-16 ENCOUNTER — Inpatient Hospital Stay (HOSPITAL_COMMUNITY)
Admission: EM | Admit: 2019-06-16 | Discharge: 2019-06-22 | DRG: 394 | Disposition: A | Discharge status: Home Health | Payer: Medicare Other | Attending: Internal Medicine | Admitting: Internal Medicine

## 2019-06-16 ENCOUNTER — Encounter (HOSPITAL_COMMUNITY): Payer: Self-pay | Admitting: Internal Medicine

## 2019-06-16 DIAGNOSIS — R1115 Cyclical vomiting syndrome unrelated to migraine: Principal | ICD-10-CM | POA: Diagnosis present

## 2019-06-16 DIAGNOSIS — F17210 Nicotine dependence, cigarettes, uncomplicated: Secondary | ICD-10-CM | POA: Diagnosis present

## 2019-06-16 DIAGNOSIS — K599 Functional intestinal disorder, unspecified: Secondary | ICD-10-CM | POA: Diagnosis present

## 2019-06-16 DIAGNOSIS — K449 Diaphragmatic hernia without obstruction or gangrene: Secondary | ICD-10-CM | POA: Diagnosis present

## 2019-06-16 DIAGNOSIS — R109 Unspecified abdominal pain: Secondary | ICD-10-CM | POA: Diagnosis not present

## 2019-06-16 DIAGNOSIS — G9340 Encephalopathy, unspecified: Secondary | ICD-10-CM | POA: Diagnosis not present

## 2019-06-16 DIAGNOSIS — E669 Obesity, unspecified: Secondary | ICD-10-CM | POA: Diagnosis present

## 2019-06-16 DIAGNOSIS — Z20822 Contact with and (suspected) exposure to covid-19: Secondary | ICD-10-CM | POA: Diagnosis present

## 2019-06-16 DIAGNOSIS — M069 Rheumatoid arthritis, unspecified: Secondary | ICD-10-CM | POA: Diagnosis present

## 2019-06-16 DIAGNOSIS — M549 Dorsalgia, unspecified: Secondary | ICD-10-CM | POA: Diagnosis present

## 2019-06-16 DIAGNOSIS — E876 Hypokalemia: Secondary | ICD-10-CM | POA: Diagnosis not present

## 2019-06-16 DIAGNOSIS — M1990 Unspecified osteoarthritis, unspecified site: Secondary | ICD-10-CM | POA: Diagnosis present

## 2019-06-16 DIAGNOSIS — T40605A Adverse effect of unspecified narcotics, initial encounter: Secondary | ICD-10-CM | POA: Diagnosis present

## 2019-06-16 DIAGNOSIS — D751 Secondary polycythemia: Secondary | ICD-10-CM | POA: Diagnosis present

## 2019-06-16 DIAGNOSIS — R739 Hyperglycemia, unspecified: Secondary | ICD-10-CM | POA: Diagnosis present

## 2019-06-16 DIAGNOSIS — E872 Acidosis: Secondary | ICD-10-CM | POA: Diagnosis not present

## 2019-06-16 DIAGNOSIS — I700 Atherosclerosis of aorta: Secondary | ICD-10-CM | POA: Diagnosis present

## 2019-06-16 DIAGNOSIS — K5730 Diverticulosis of large intestine without perforation or abscess without bleeding: Secondary | ICD-10-CM | POA: Diagnosis present

## 2019-06-16 DIAGNOSIS — Z79891 Long term (current) use of opiate analgesic: Secondary | ICD-10-CM

## 2019-06-16 DIAGNOSIS — G8929 Other chronic pain: Secondary | ICD-10-CM | POA: Diagnosis present

## 2019-06-16 DIAGNOSIS — Z8249 Family history of ischemic heart disease and other diseases of the circulatory system: Secondary | ICD-10-CM

## 2019-06-16 DIAGNOSIS — M0520 Rheumatoid vasculitis with rheumatoid arthritis of unspecified site: Secondary | ICD-10-CM | POA: Diagnosis present

## 2019-06-16 DIAGNOSIS — Z91030 Bee allergy status: Secondary | ICD-10-CM

## 2019-06-16 DIAGNOSIS — R17 Unspecified jaundice: Secondary | ICD-10-CM | POA: Diagnosis not present

## 2019-06-16 DIAGNOSIS — E860 Dehydration: Secondary | ICD-10-CM | POA: Diagnosis not present

## 2019-06-16 DIAGNOSIS — Z6830 Body mass index (BMI) 30.0-30.9, adult: Secondary | ICD-10-CM

## 2019-06-16 LAB — COMPREHENSIVE METABOLIC PANEL
ALT: 13 U/L (ref 0–44)
AST: 15 U/L (ref 15–41)
Albumin: 4 g/dL (ref 3.5–5.0)
Alkaline Phosphatase: 72 U/L (ref 38–126)
Anion gap: 8 (ref 5–15)
BUN: 8 mg/dL (ref 6–20)
CO2: 24 mmol/L (ref 22–32)
Calcium: 9 mg/dL (ref 8.9–10.3)
Chloride: 107 mmol/L (ref 98–111)
Creatinine, Ser: 0.71 mg/dL (ref 0.44–1.00)
GFR calc Af Amer: >60 mL/min (ref >60)
GFR calc non Af Amer: >60 mL/min (ref >60)
Glucose, Bld: 129 mg/dL — ABNORMAL HIGH (ref 70–99)
Potassium: 3.8 mmol/L (ref 3.5–5.1)
Sodium: 139 mmol/L (ref 135–145)
Total Bilirubin: 0.6 mg/dL (ref 0.3–1.2)
Total Protein: 7.7 g/dL (ref 6.5–8.1)

## 2019-06-16 LAB — CBC
HCT: 40.5 % (ref 36.0–46.0)
Hemoglobin: 13.4 g/dL (ref 12.0–15.0)
MCH: 29.4 pg (ref 26.0–34.0)
MCHC: 33.1 g/dL (ref 30.0–36.0)
MCV: 88.8 fL (ref 80.0–100.0)
Platelets: 297 10*3/uL (ref 150–400)
RBC: 4.56 MIL/uL (ref 3.87–5.11)
RDW: 13.7 % (ref 11.5–15.5)
WBC: 5.2 10*3/uL (ref 4.0–10.5)
nRBC: 0 % (ref 0.0–0.2)

## 2019-06-16 LAB — LIPASE, BLOOD: Lipase: 27 U/L (ref 11–51)

## 2019-06-16 LAB — SARS CORONAVIRUS 2 (TAT 6-24 HRS): SARS Coronavirus 2: NEGATIVE

## 2019-06-16 MED ORDER — SODIUM CHLORIDE 0.9 % IV SOLN: INTRAVENOUS | Status: DC

## 2019-06-16 MED ORDER — ONDANSETRON HCL 4 MG/2ML IJ SOLN
4.0000 mg | Freq: Four times a day (QID) | INTRAMUSCULAR | Status: DC | PRN
  Administered 2019-06-16 – 2019-06-21 (×12): 4 mg via INTRAVENOUS
  Filled 2019-06-16 (×12): qty 2

## 2019-06-16 MED ORDER — SODIUM CHLORIDE 0.9% FLUSH: 3.0000 mL | Freq: Once | INTRAVENOUS | Status: DC

## 2019-06-16 MED ORDER — LORAZEPAM 2 MG/ML IJ SOLN
0.5000 mg | Freq: Once | INTRAMUSCULAR | Status: AC
  Administered 2019-06-16: 0.5 mg via INTRAVENOUS
  Filled 2019-06-16: qty 1

## 2019-06-16 MED ORDER — MORPHINE SULFATE (PF) 4 MG/ML IV SOLN
8.0000 mg | Freq: Once | INTRAVENOUS | Status: AC
  Administered 2019-06-16: 8 mg via INTRAVENOUS
  Filled 2019-06-16: qty 2

## 2019-06-16 MED ORDER — METOCLOPRAMIDE HCL 5 MG/ML IJ SOLN
10.0000 mg | Freq: Once | INTRAMUSCULAR | Status: AC
  Administered 2019-06-16: 10 mg via INTRAVENOUS
  Filled 2019-06-16: qty 2

## 2019-06-16 MED ORDER — NICOTINE 21 MG/24HR TD PT24
21.0000 mg | MEDICATED_PATCH | Freq: Every day | TRANSDERMAL | Status: DC
  Administered 2019-06-16 – 2019-06-19 (×2): 21 mg via TRANSDERMAL
  Filled 2019-06-16 (×4): qty 1

## 2019-06-16 MED ORDER — ONDANSETRON HCL 4 MG PO TABS: 4.0000 mg | ORAL_TABLET | Freq: Four times a day (QID) | ORAL | Status: DC | PRN

## 2019-06-16 MED ORDER — METOCLOPRAMIDE HCL 5 MG/ML IJ SOLN
10.0000 mg | Freq: Four times a day (QID) | INTRAMUSCULAR | Status: DC
  Administered 2019-06-16 – 2019-06-18 (×8): 10 mg via INTRAVENOUS
  Filled 2019-06-16 (×8): qty 2

## 2019-06-16 MED ORDER — FENTANYL CITRATE (PF) 100 MCG/2ML IJ SOLN
25.0000 ug | Freq: Once | INTRAMUSCULAR | Status: AC
  Administered 2019-06-17: 25 ug via INTRAVENOUS
  Filled 2019-06-16: qty 2

## 2019-06-16 MED ORDER — ACETAMINOPHEN 650 MG RE SUPP
650.0000 mg | Freq: Four times a day (QID) | RECTAL | Status: DC | PRN
  Administered 2019-06-17: 650 mg via RECTAL
  Filled 2019-06-16: qty 1

## 2019-06-16 MED ORDER — PANTOPRAZOLE SODIUM 40 MG IV SOLR
40.0000 mg | Freq: Every day | INTRAVENOUS | Status: DC
  Administered 2019-06-16 – 2019-06-22 (×7): 40 mg via INTRAVENOUS
  Filled 2019-06-16 (×7): qty 40

## 2019-06-16 MED ORDER — ACETAMINOPHEN 325 MG PO TABS
650.0000 mg | ORAL_TABLET | Freq: Four times a day (QID) | ORAL | Status: DC | PRN
  Filled 2019-06-16 (×2): qty 2

## 2019-06-16 MED ORDER — SODIUM CHLORIDE 0.9 % IV BOLUS
1000.0000 mL | Freq: Once | INTRAVENOUS | Status: AC
  Administered 2019-06-16: 1000 mL via INTRAVENOUS

## 2019-06-16 NOTE — ED Triage Notes (Signed)
Transported by [facility] from home-- generalized abdominal pain plus n/v. Hx of hiatal hernia. EMS administered   8 mg of Zofran  100 mcg of Fentanyl  350 cc of NS  VSS. CBG read 148 mg/dl.

## 2019-06-16 NOTE — H&P (Signed)
History and Physical    Kristen Ramos ZHG:[id number] DOB: 11/05/1960 DOA: 06/16/2019  PCP: Casimer Lanius, MD   Patient coming from: Home.  I have personally briefly reviewed patient's old medical records in [facility]  Chief Complaint: Nausea and vomiting.  HPI: Kristen Ramos is a 59 y.o. female with medical history significant of rheumatoid arthritis, chronic back pain, cyclical vomiting who is coming to the emergency department due to multiple episodes of nausea and emesis since yesterday.  The symptoms are typical of her cyclical vomiting flares.  She denies fever, chills, night sweats, hematemesis, diarrhea, melena or hematochezia.  She denies dyspnea, chest pain, palpitations, dizziness, diaphoresis, PND, orthopnea or pitting edema of the lower extremities.  No dysuria, frequency or materia.  Denies polyuria, polydipsia, polyphagia or blurred vision.  ED Course: Initial vital signs temperature 97.5 F, pulse 71, respiration of 13, blood pressure 130/85 mmHg and O2 sat 93% on room air.  The patient received a 1000 mL NS bolus, 8 mg of morphine IVP, 10 mg of metoclopramide IVP and 0.5 mg lorazepam IV.  CBC was normal.  Lipase was normal.  CMP shows a glucose of 129 mg/dL, all other values are within normal range.  Three-way abdomen does not show any bowel dilatation, there is occasional air-fluid levels noted.  There is questionable enteritis or ileus.  Bowel obstruction was felt to be less likely.  Please see images and full radiology report for further detail.  Review of Systems: As per HPI otherwise 10 point review of systems negative.   Past Medical History:  Diagnosis Date  . Arthritis   . Chronic back pain     Past Surgical History:  Procedure Laterality Date  . ABDOMINAL HYSTERECTOMY    . BACK SURGERY    . BIOPSY  04/19/2018   Procedure: BIOPSY;  Surgeon: Kerin Salen, MD;  Location: [facility];  Service: Gastroenterology;;  . CESAREAN SECTION    .  ESOPHAGOGASTRODUODENOSCOPY (EGD) WITH PROPOFOL N/A 04/19/2018   Procedure: ESOPHAGOGASTRODUODENOSCOPY (EGD) WITH PROPOFOL;  Surgeon: Kerin Salen, MD;  Location: [facility];  Service: Gastroenterology;  Laterality: N/A;     reports that she has been smoking cigarettes. She has never used smokeless tobacco. She reports that she does not drink alcohol or use drugs.  Allergies  Allergen Reactions  . Bee Venom Swelling   Family history Mother hypertension.  Prior to Admission medications   Medication Sig Start Date End Date Taking? Authorizing Provider  amitriptyline (ELAVIL) 25 MG tablet Take 25 mg by mouth daily. 05/21/19   [provider]  cetirizine (ZYRTEC) 10 MG tablet Take 10 mg by mouth daily. 05/26/19   [provider]  famotidine (PEPCID) 40 MG tablet Take 40 mg by mouth 2 (two) times daily. 03/31/19   [provider]  fluticasone (FLONASE) 50 MCG/ACT nasal spray Place 2 sprays into both nostrils daily. 05/20/19   [provider]  folic acid (FOLVITE) 1 MG tablet Take 1 mg by mouth daily. 04/01/19   [provider]  HORIZANT 600 MG TBCR Take 1 tablet by mouth 2 (two) times daily. 06/01/19   [provider]  methotrexate (RHEUMATREX) 2.5 MG tablet Take 15 mg by mouth once a week. 03/31/19   [provider]  ondansetron (ZOFRAN-ODT) 4 MG disintegrating tablet Take 1 tablet (4 mg total) by mouth every 8 (eight) hours as needed for up to 120 doses for nausea or vomiting. 10/07/18   Delano Metz, MD  ondansetron (ZOFRAN-ODT) 8 MG disintegrating  tablet SMARTSIG:1 Tablet(s) By Mouth Every 12 Hours PRN 05/20/19   [provider]  oxyCODONE (ROXICODONE) 15 MG immediate release tablet Take 15 mg by mouth 4 (four) times daily as needed for pain.  02/03/18   [provider]  pantoprazole (PROTONIX) 40 MG tablet Take 1 tablet (40 mg total) by mouth daily. 07/21/18   Seawell, Jaimie A, DO  sucralfate (CARAFATE) 1 GM/10ML  suspension Take 1 g by mouth 4 (four) times daily. 05/20/19   [provider]  tiZANidine (ZANAFLEX) 4 MG tablet Take 4 mg by mouth 2 (two) times daily as needed for muscle spasms. 09/29/18   [provider]    Physical Exam: Vitals:   06/16/19 1215 06/16/19 1330 06/16/19 1430 06/16/19 1521  BP: 128/77 124/79 131/79 110/76  Pulse: 69 87 73 84  Resp: 15 19 14 14   Temp:    97.8 F (36.6 C)  TempSrc:    Oral  SpO2: 100% 99% 98% 92%    Constitutional: NAD, calm, comfortable Eyes: PERRL, lids and conjunctivae normal ENMT: Mucous membranes are dry. Posterior pharynx clear of any exudate or lesions.Normal dentition.  Neck: normal, supple, no masses, no thyromegaly Respiratory: clear to auscultation bilaterally, no wheezing, no crackles. Normal respiratory effort. No accessory muscle use.  Cardiovascular: Regular rate and rhythm, no murmurs / rubs / gallops. No extremity edema. 2+ pedal pulses. No carotid bruits.  Abdomen: Nondistended.  BS positive.  Soft, positive for epigastric tenderness, no guarding/rebound/masses palpated. No hepatosplenomegaly. Bowel sounds positive.  Musculoskeletal: no clubbing / cyanosis. Good ROM, no contractures. Normal muscle tone.  Skin: no rashes, lesions, ulcers on very limited dermatological examination. Neurologic: CN 2-12 grossly intact. Sensation intact, DTR normal. Strength 5/5 in all 4.  Psychiatric: Somnolent, but wakes up and answer questions. Alert and oriented x 3. Normal mood.   Labs on Admission: I have personally reviewed following labs and imaging studies  CBC: Recent Labs  Lab 06/16/19 1103  WBC 5.2  HGB 13.4  HCT 40.5  MCV 88.8  PLT 194   Basic Metabolic Panel: Recent Labs  Lab 06/16/19 1103  NA 139  K 3.8  CL 107  CO2 24  GLUCOSE 129*  BUN 8  CREATININE 0.71  CALCIUM 9.0   GFR: CrCl cannot be calculated (Unknown ideal weight.). Liver Function Tests: Recent Labs  Lab 06/16/19 1103  AST 15  ALT 13    ALKPHOS 72  BILITOT 0.6  PROT 7.7  ALBUMIN 4.0   Recent Labs  Lab 06/16/19 1103  LIPASE 27   No results for input(s): AMMONIA in the last 168 hours. Coagulation Profile: No results for input(s): INR, PROTIME in the last 168 hours. Cardiac Enzymes: No results for input(s): CKTOTAL, CKMB, CKMBINDEX, TROPONINI in the last 168 hours. BNP (last 3 results) No results for input(s): PROBNP in the last 8760 hours. HbA1C: No results for input(s): HGBA1C in the last 72 hours. CBG: No results for input(s): GLUCAP in the last 168 hours. Lipid Profile: No results for input(s): CHOL, HDL, LDLCALC, TRIG, CHOLHDL, LDLDIRECT in the last 72 hours. Thyroid Function Tests: No results for input(s): TSH, T4TOTAL, FREET4, T3FREE, THYROIDAB in the last 72 hours. Anemia Panel: No results for input(s): VITAMINB12, FOLATE, FERRITIN, TIBC, IRON, RETICCTPCT in the last 72 hours. Urine analysis:  Radiological Exams on Admission: DG ABD ACUTE 2+V W 1V CHEST  Result Date: 06/16/2019 CLINICAL DATA:  Abdominal distension and vomiting EXAM: DG ABDOMEN ACUTE W/ 1V CHEST COMPARISON:  Abdomen series Jul 15, 2018 FINDINGS: PA chest: Lungs are clear. Heart size and pulmonary vascularity are normal. No adenopathy. There is postoperative change in the lower cervical spine region. Supine and left lateral decubitus abdomen: There is moderate stool in the colon. There is no appreciable bowel dilatation. There are scattered air-fluid levels. No free air. There are occasional pelvic calcifications, likely phleboliths. Postoperative change noted in the lower lumbar spine. IMPRESSION: No bowel dilatation. Occasional air-fluid levels noted. Question a degree of enteritis or ileus. Bowel obstruction felt to be less likely. No free air. Lungs clear. Electronically Signed   By: Bretta Bang III M.D.   On: 06/16/2019 11:34    EKG: Independently reviewed.  Assessment/Plan Principal Problem:   Intractable cyclical  vomiting Ulceration/MedSurg. Keep n.p.o. Continue IV fluids. Analgesics as needed. Antiemetics as needed. Follow CBC and BMP in a.m.  Active Problems:   Rheumatoid arteritis (HCC) Hold methotrexate. Parenteral analgesics as needed.   DVT prophylaxis: SCDs. Code Status: Full code. Family Communication: Disposition Plan: Observation for IV hydration and symptoms treatment. Consults called: Admission status: Observation/MedSurg.   Bobette Mo MD [facility]  If 7PM-7AM, please contact night-coverage [email or web address]  06/16/2019, 4:35 PM   This document was prepared using Dragon voice recognition software and may contain some unintended transcription errors.

## 2019-06-16 NOTE — ED Notes (Signed)
Patient transported to XR. 

## 2019-06-16 NOTE — ED Provider Notes (Signed)
[facility]-EMERGENCY DEPT Provider Note   CSN: [id number] Arrival date & time: 06/16/19  1034     History Chief Complaint  Patient presents with  . Abdominal Pain    Lucill Mauck is a 59 y.o. female.  59 year old female with history of cyclical vomiting syndrome presents with diffuse abdominal discomfort along with emesis.  Patient states compliance with her medications.  Symptoms began yesterday and emesis has been nonbilious or bloody.  No fever or chills.  Systems have been progressively worse and she usually gets admitted for this.  No urinary symptoms.  No vaginal bleeding or discharge.  Patient was admitted in [place] several weeks ago and was discharged to follow-up with her physician who is with lab [facility]        Past Medical History:  Diagnosis Date  . Arthritis   . Chronic back pain     Patient Active Problem List   Diagnosis Date Noted  . Chronic back pain   . Hypokalemia   . Impaired fasting blood sugar   . Abdominal pain 04/15/2018  . Nausea & vomiting 04/15/2018  . 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 Intractable nausea and vomiting 02/12/2018  . Epigastric abdominal pain 02/12/2018  . Acute gastroenteritis 02/12/2018    Past Surgical History:  Procedure Laterality Date  . ABDOMINAL HYSTERECTOMY    . BACK SURGERY    . BIOPSY  04/19/2018   Procedure: BIOPSY;  Surgeon: Kerin Salen, MD;  Location: [facility];  Service: Gastroenterology;;  . CESAREAN SECTION    . ESOPHAGOGASTRODUODENOSCOPY (EGD) WITH PROPOFOL N/A 04/19/2018   Procedure: ESOPHAGOGASTRODUODENOSCOPY (EGD) WITH PROPOFOL;  Surgeon: Kerin Salen, MD;  Location: [facility];  Service: Gastroenterology;  Laterality: N/A;     OB History   No obstetric history on file.     No family history on file.  Social History   Tobacco Use  . Smoking status: Current Every Day Smoker    Types: Cigarettes  . Smokeless tobacco: Never Used  Substance Use Topics  .  Alcohol use: No  . Drug use: No    Home Medications Prior to Admission medications   Medication Sig Start Date End Date Taking? Authorizing Provider  ondansetron (ZOFRAN-ODT) 4 MG disintegrating tablet Take 1 tablet (4 mg total) by mouth every 8 (eight) hours as needed for up to 120 doses for nausea or vomiting. 10/07/18   Delano Metz, MD  oxyCODONE (ROXICODONE) 15 MG immediate release tablet Take 15 mg by mouth 4 (four) times daily as needed for pain.  02/03/18   [provider]  pantoprazole (PROTONIX) 40 MG tablet Take 1 tablet (40 mg total) by mouth daily. 07/21/18   Seawell, Jaimie A, DO  tiZANidine (ZANAFLEX) 4 MG tablet Take 4 mg by mouth 2 (two) times daily as needed for muscle spasms. 09/29/18   [provider]    Allergies    Bee venom  Review of Systems   Review of Systems  All other systems reviewed and are negative.   Physical Exam Updated Vital Signs BP 138/85 (BP Location: Right Arm)   Pulse 71   Temp (!) 97.5 F (36.4 C) (Oral)   Resp 13   SpO2 93%   Physical Exam Vitals and nursing note reviewed.  Constitutional:      General: She is not in acute distress.    Appearance: Normal appearance. She is well-developed. She is not toxic-appearing.  HENT:     Head: Normocephalic and atraumatic.  Eyes:  General: Lids are normal.     Conjunctiva/sclera: Conjunctivae normal.     Pupils: Pupils are equal, round, and reactive to light.  Neck:     Thyroid: No thyroid mass.     Trachea: No tracheal deviation.  Cardiovascular:     Rate and Rhythm: Normal rate and regular rhythm.     Heart sounds: Normal heart sounds. No murmur. No gallop.   Pulmonary:     Effort: Pulmonary effort is normal. No respiratory distress.     Breath sounds: Normal breath sounds. No stridor. No decreased breath sounds, wheezing, rhonchi or rales.  Abdominal:     General: Bowel sounds are normal. There is no distension.     Palpations: Abdomen is soft.     Tenderness:  There is generalized abdominal tenderness. There is no guarding or rebound.  Musculoskeletal:        General: No tenderness. Normal range of motion.     Cervical back: Normal range of motion and neck supple.  Skin:    General: Skin is warm and dry.     Findings: No abrasion or rash.  Neurological:     Mental Status: She is alert and oriented to person, place, and time.     GCS: GCS eye subscore is 4. GCS verbal subscore is 5. GCS motor subscore is 6.     Cranial Nerves: No cranial nerve deficit.     Sensory: No sensory deficit.  Psychiatric:        Speech: Speech normal.        Behavior: Behavior normal.     ED Results / Procedures / Treatments   Labs (all labs ordered are listed, but only abnormal results are displayed) Labs Reviewed  LIPASE, BLOOD  COMPREHENSIVE METABOLIC PANEL  CBC  URINALYSIS, ROUTINE W REFLEX MICROSCOPIC  RAPID URINE DRUG SCREEN, HOSP PERFORMED    EKG None  Radiology No results found.  Procedures Procedures (including critical care time)  Medications Ordered in ED Medications  sodium chloride flush (NS) 0.9 % injection 3 mL (3 mLs Intravenous Not Given 06/16/19 1106)  sodium chloride 0.9 % bolus 1,000 mL (has no administration in time range)  0.9 %  sodium chloride infusion (has no administration in time range)  metoCLOPramide (REGLAN) injection 10 mg (has no administration in time range)  morphine 4 MG/ML injection 8 mg (has no administration in time range)  LORazepam (ATIVAN) injection 0.5 mg (has no administration in time range)    ED Course  I have reviewed the triage vital signs and the nursing notes.  Pertinent labs & imaging results that were available during my care of the patient were reviewed by me and considered in my medical decision making (see chart for details).    MDM Rules/Calculators/A&P                      Patient given IV fluids, antiemetics as well as medications for pain.  Continues to endorse nausea.  Patient states  her symptoms are similar to when she is been admitted every time when she has had her cyclical vomiting.  Will consult hospitalist Final Clinical Impression(s) / ED Diagnoses Final diagnoses:  None    Rx / DC Orders ED Discharge Orders    None       Lacretia Leigh, MD 06/16/19 1337

## 2019-06-17 ENCOUNTER — Inpatient Hospital Stay (HOSPITAL_COMMUNITY): Payer: Medicare Other

## 2019-06-17 DIAGNOSIS — M1990 Unspecified osteoarthritis, unspecified site: Secondary | ICD-10-CM | POA: Diagnosis present

## 2019-06-17 DIAGNOSIS — Z79891 Long term (current) use of opiate analgesic: Secondary | ICD-10-CM | POA: Diagnosis not present

## 2019-06-17 DIAGNOSIS — T40605A Adverse effect of unspecified narcotics, initial encounter: Secondary | ICD-10-CM | POA: Diagnosis present

## 2019-06-17 DIAGNOSIS — K5730 Diverticulosis of large intestine without perforation or abscess without bleeding: Secondary | ICD-10-CM | POA: Diagnosis present

## 2019-06-17 DIAGNOSIS — K449 Diaphragmatic hernia without obstruction or gangrene: Secondary | ICD-10-CM | POA: Diagnosis present

## 2019-06-17 DIAGNOSIS — Z8249 Family history of ischemic heart disease and other diseases of the circulatory system: Secondary | ICD-10-CM | POA: Diagnosis not present

## 2019-06-17 DIAGNOSIS — M0520 Rheumatoid vasculitis with rheumatoid arthritis of unspecified site: Secondary | ICD-10-CM

## 2019-06-17 DIAGNOSIS — Z6830 Body mass index (BMI) 30.0-30.9, adult: Secondary | ICD-10-CM | POA: Diagnosis not present

## 2019-06-17 DIAGNOSIS — I700 Atherosclerosis of aorta: Secondary | ICD-10-CM | POA: Diagnosis present

## 2019-06-17 DIAGNOSIS — R739 Hyperglycemia, unspecified: Secondary | ICD-10-CM | POA: Diagnosis present

## 2019-06-17 DIAGNOSIS — E669 Obesity, unspecified: Secondary | ICD-10-CM | POA: Diagnosis present

## 2019-06-17 DIAGNOSIS — E876 Hypokalemia: Secondary | ICD-10-CM | POA: Diagnosis not present

## 2019-06-17 DIAGNOSIS — E860 Dehydration: Secondary | ICD-10-CM | POA: Diagnosis not present

## 2019-06-17 DIAGNOSIS — R109 Unspecified abdominal pain: Secondary | ICD-10-CM | POA: Diagnosis present

## 2019-06-17 DIAGNOSIS — K599 Functional intestinal disorder, unspecified: Secondary | ICD-10-CM | POA: Diagnosis present

## 2019-06-17 DIAGNOSIS — R1115 Cyclical vomiting syndrome unrelated to migraine: Secondary | ICD-10-CM | POA: Diagnosis present

## 2019-06-17 DIAGNOSIS — M549 Dorsalgia, unspecified: Secondary | ICD-10-CM | POA: Diagnosis present

## 2019-06-17 DIAGNOSIS — Z720 Tobacco use: Secondary | ICD-10-CM

## 2019-06-17 DIAGNOSIS — E872 Acidosis: Secondary | ICD-10-CM | POA: Diagnosis not present

## 2019-06-17 DIAGNOSIS — G8929 Other chronic pain: Secondary | ICD-10-CM | POA: Diagnosis present

## 2019-06-17 DIAGNOSIS — R17 Unspecified jaundice: Secondary | ICD-10-CM | POA: Diagnosis not present

## 2019-06-17 DIAGNOSIS — D751 Secondary polycythemia: Secondary | ICD-10-CM | POA: Diagnosis present

## 2019-06-17 DIAGNOSIS — R42 Dizziness and giddiness: Secondary | ICD-10-CM

## 2019-06-17 DIAGNOSIS — Z20822 Contact with and (suspected) exposure to covid-19: Secondary | ICD-10-CM | POA: Diagnosis present

## 2019-06-17 DIAGNOSIS — M069 Rheumatoid arthritis, unspecified: Secondary | ICD-10-CM | POA: Diagnosis present

## 2019-06-17 DIAGNOSIS — F17210 Nicotine dependence, cigarettes, uncomplicated: Secondary | ICD-10-CM | POA: Diagnosis present

## 2019-06-17 DIAGNOSIS — G9340 Encephalopathy, unspecified: Secondary | ICD-10-CM | POA: Diagnosis not present

## 2019-06-17 LAB — CBC WITH DIFFERENTIAL/PLATELET
Abs Immature Granulocytes: 0.01 10*3/uL (ref 0.00–0.07)
Basophils Absolute: 0.1 10*3/uL (ref 0.0–0.1)
Basophils Relative: 1 %
Eosinophils Absolute: 0.2 10*3/uL (ref 0.0–0.5)
Eosinophils Relative: 3 %
HCT: 38.7 % (ref 36.0–46.0)
Hemoglobin: 13 g/dL (ref 12.0–15.0)
Immature Granulocytes: 0 %
Lymphocytes Relative: 24 %
Lymphs Abs: 1.3 10*3/uL (ref 0.7–4.0)
MCH: 30.2 pg (ref 26.0–34.0)
MCHC: 33.6 g/dL (ref 30.0–36.0)
MCV: 89.8 fL (ref 80.0–100.0)
Monocytes Absolute: 0.4 10*3/uL (ref 0.1–1.0)
Monocytes Relative: 7 %
Neutro Abs: 3.6 10*3/uL (ref 1.7–7.7)
Neutrophils Relative %: 65 %
Platelets: 265 10*3/uL (ref 150–400)
RBC: 4.31 MIL/uL (ref 3.87–5.11)
RDW: 13.8 % (ref 11.5–15.5)
WBC: 5.5 10*3/uL (ref 4.0–10.5)
nRBC: 0 % (ref 0.0–0.2)

## 2019-06-17 LAB — COMPREHENSIVE METABOLIC PANEL
ALT: 13 U/L (ref 0–44)
AST: 16 U/L (ref 15–41)
Albumin: 3.7 g/dL (ref 3.5–5.0)
Alkaline Phosphatase: 69 U/L (ref 38–126)
Anion gap: 9 (ref 5–15)
BUN: 10 mg/dL (ref 6–20)
CO2: 22 mmol/L (ref 22–32)
Calcium: 8.8 mg/dL — ABNORMAL LOW (ref 8.9–10.3)
Chloride: 112 mmol/L — ABNORMAL HIGH (ref 98–111)
Creatinine, Ser: 0.78 mg/dL (ref 0.44–1.00)
GFR calc Af Amer: >60 mL/min (ref >60)
GFR calc non Af Amer: >60 mL/min (ref >60)
Glucose, Bld: 106 mg/dL — ABNORMAL HIGH (ref 70–99)
Potassium: 3.8 mmol/L (ref 3.5–5.1)
Sodium: 143 mmol/L (ref 135–145)
Total Bilirubin: 0.7 mg/dL (ref 0.3–1.2)
Total Protein: 7.2 g/dL (ref 6.5–8.1)

## 2019-06-17 LAB — RAPID URINE DRUG SCREEN, HOSP PERFORMED
Amphetamines: NOT DETECTED
Barbiturates: NOT DETECTED
Benzodiazepines: NOT DETECTED
Cocaine: NOT DETECTED
Opiates: POSITIVE — AB
Tetrahydrocannabinol: NOT DETECTED

## 2019-06-17 LAB — URINALYSIS, ROUTINE W REFLEX MICROSCOPIC
Bilirubin Urine: NEGATIVE
Glucose, UA: NEGATIVE mg/dL
Hgb urine dipstick: NEGATIVE
Ketones, ur: 5 mg/dL — AB
Leukocytes,Ua: NEGATIVE
Nitrite: NEGATIVE
Protein, ur: NEGATIVE mg/dL
Specific Gravity, Urine: 1.017 (ref 1.005–1.030)
pH: 6 (ref 5.0–8.0)

## 2019-06-17 LAB — PHOSPHORUS: Phosphorus: 3.1 mg/dL (ref 2.5–4.6)

## 2019-06-17 LAB — MAGNESIUM: Magnesium: 2.1 mg/dL (ref 1.7–2.4)

## 2019-06-17 MED ORDER — POLYETHYLENE GLYCOL 3350 17 G PO PACK
17.0000 g | PACK | Freq: Every day | ORAL | Status: DC
  Administered 2019-06-19: 17 g via ORAL
  Filled 2019-06-17 (×4): qty 1

## 2019-06-17 MED ORDER — TECHNETIUM TC 99M MEBROFENIN IV KIT
5.5000 | PACK | Freq: Once | INTRAVENOUS | Status: AC | PRN
  Administered 2019-06-17: 5.5 via INTRAVENOUS

## 2019-06-17 MED ORDER — ENOXAPARIN SODIUM 40 MG/0.4ML ~~LOC~~ SOLN
40.0000 mg | SUBCUTANEOUS | Status: DC
  Administered 2019-06-17 – 2019-06-19 (×2): 40 mg via SUBCUTANEOUS
  Filled 2019-06-17 (×3): qty 0.4

## 2019-06-17 NOTE — Progress Notes (Signed)
PROGRESS NOTE    [name]  KGM:[id number] DOB: [birth year] DOA: 06/16/2019 PCP: Casimer Lanius, MD   Brief Narrative:  HPI per Dr. Sanda Klein on 06/16/2019  Kristen Ramos is a 59 y.o. female with medical history significant of rheumatoid arthritis, chronic back pain, cyclical vomiting who is coming to the emergency department due to multiple episodes of nausea and emesis since yesterday.  The symptoms are typical of her cyclical vomiting flares.  She denies fever, chills, night sweats, hematemesis, diarrhea, melena or hematochezia.  She denies dyspnea, chest pain, palpitations, dizziness, diaphoresis, PND, orthopnea or pitting edema of the lower extremities.  No dysuria, frequency or materia.  Denies polyuria, polydipsia, polyphagia or blurred vision.  ED Course: Initial vital signs temperature 97.5 F, pulse 71, respiration of 13, blood pressure 130/85 mmHg and O2 sat 93% on room air.  The patient received a 1000 mL NS bolus, 8 mg of morphine IVP, 10 mg of metoclopramide IVP and 0.5 mg lorazepam IV.  CBC was normal.  Lipase was normal.  CMP shows a glucose of 129 mg/dL, all other values are within normal range.  Three-way abdomen does not show any bowel dilatation, there is occasional air-fluid levels noted.  There is questionable enteritis or ileus.  Bowel obstruction was felt to be less likely.  Please see images and full radiology report for further detail.  **Interim History  She continues to have intractable nausea or vomiting spite being on scheduled Reglan and as needed Zofran.  Because of this gastroenterology was consulted for further evaluation commendations and they are recommending a HIDA scan and head CT scan without contrast to rule out mass lesions or other serious causes of dizziness concurrent nausea and vomiting.  She continues to endorse diffuse abdominal pain but gastroenterology feels this may be secondary to chronic opioid usage.  GI recommending Reglan for now but  recommends discontinuing or decreasing it tomorrow if she does not improve.  They are also recommending MiraLAX trial to see if the constipation is contributing to her symptoms and also recommending decreasing and limiting narcotics.   Assessment & Plan:   Principal Problem:   Intractable cyclical vomiting Active Problems:   Rheumatoid arteritis (HCC)  Intractable Cyclical Nausea and Vomiting Associated with abdominal pain -Admit to Med/Surge . -Keep n.p.o. -Continue IV fluids with NS at 125 mL/hr. -D/G Abdomen showed "No bowel dilatation. Occasional air-fluid levels noted. Question a degree of enteritis or ileus. Bowel obstruction felt to be less likely. No free air. Lungs clear." -C/w Analgesics as needed with Judicious use of Narcotics -Received IV Fentanyl 25 mcg yesterday -C/w Antiemetics with Ondansetron 4 mg po/IV q6hprn Nausea and continuing Metoclopramide 10 mg IV q6h -Because had no improvement Gastroenterology was consulted for further evaluation recommendations and recommending a HIDA scan today as well as a head CT scan to rule out mass lesions or other serious causes with dizziness with concurrent nausea or vomiting -They recommend continue supportive care as well as antiemetics as needed continue Reglan for now however decreasing it or stopping it tomorrow if her symptoms do not improve -They also recommend a trial of MiraLAX to see if constipation is contributing to her symptoms and recommending decreasing/limiting narcotic usage -Continue with IV PPI with Pantoprazole 40 mg -We will monitor patient's clinical response to intervention and follow up on gastroenterology recommendations and will need to follow after the HIDA scan and the head CT scan -Check UDS and U/A and still pending   Rheumatoid arteritis (HCC) -Hold methotrexate. -  C/w Parenteral analgesics as needed and she received IV fentanyl 25 mcg yesterday  -Continue acetaminophen  Obesity -Estimated body mass  index is 30.41 kg/m as calculated from the following:   Height as of this encounter: 5\' 8"  (1.727 m).   Weight as of this encounter: 90.7 kg. -Weight Loss and Dietary Counseling given   Hyperglycemia -Check HbA1c in the AM -Blood sugars have been ranging from 106-129 on daily CMP's -If necessary will place on sensitive NovoLog/scale insulin  Tobacco Abuse -Smoking cessation counseling given Continue nicotine 21 mg transdermally every 24 hours daily  Dizziness -Head CT being obtained by Gastroenterology   DVT prophylaxis: Enoxaparin 40 mg sq q24h Code Status: FULL CODE  Family Communication: No family present at bedside  Disposition Plan: Pending further gastroenterology evaluation and diet tolerance.  Patient is from home and likely will be discharged back home when she is able to tolerate a diet and is no longer nauseous and vomiting.  She has several studies for further work-up that are needing to be done and she is being continued on antiemetics  Consultants:   Gastroenterology    Procedures:  HIDA Scan    Antimicrobials:  Anti-infectives (From admission, onward)   None     Subjective: Seen and examined at bedside was continued to not feel well and felt uncomfortable and very nauseous.  States that "I just do not feel good".  No chest pain but was feeling dizzy.  No other concerns or complaints at this time.  Objective: Vitals:   06/16/19 2313 06/17/19 0311 06/17/19 0342 06/17/19 0500  BP: 112/65 (!) 137/96 114/63   Pulse: 81 78 79   Resp: 20 16 16    Temp: 98.3 F (36.8 C) 97.9 F (36.6 C) (!) 97.4 F (36.3 C)   TempSrc: Oral Oral Oral   SpO2: 97% 98% 98%   Weight:    90.7 kg  Height:    5\' 8"  (1.727 m)    Intake/Output Summary (Last 24 hours) at 06/17/2019 0854 Last data filed at 06/17/2019 0400 Gross per 24 hour  Intake 1480.01 ml  Output --  Net 1480.01 ml   Filed Weights   06/17/19 0500  Weight: 90.7 kg   Examination: Physical  Exam:  Constitutional: WN/WD obese African-American female currently appears uncomfortable and in some mild distress being nauseous Eyes: Lids and conjunctivae normal, sclerae anicteric  ENMT: External Ears, Nose appear normal. Grossly normal hearing.  Neck: Appears normal, supple, no cervical masses, normal ROM, no appreciable thyromegaly; no JVD  Respiratory: Diminished to auscultation bilaterally, no wheezing, rales, rhonchi or crackles. Normal respiratory effort and patient is not tachypenic. Unlabored breathing  Cardiovascular: RRR, no murmurs / rubs / gallops. S1 and S2 auscultated. No extremity edema.  Abdomen: Soft, tender to palpate, Distended due to body habitus. Bowel sounds positive.  GU: Deferred. Musculoskeletal: No clubbing / cyanosis of digits/nails. No joint deformity upper and lower extremities.  Skin: No rashes, lesions, ulcers on a limited skin evaluation. No induration; Warm and dry.  Neurologic: CN 2-12 grossly intact with no focal deficits. Romberg sign and cerebellar reflexes not assessed.  Psychiatric: Normal judgment and insight. Alert and oriented x 3. Anxious mood and appropriate affect.   Data Reviewed: I have personally reviewed following labs and imaging studies  CBC: Recent Labs  Lab 06/16/19 1103  WBC 5.2  HGB 13.4  HCT 40.5  MCV 88.8  PLT 297   Basic Metabolic Panel: Recent Labs  Lab 06/16/19 1103  NA 139  K 3.8  CL 107  CO2 24  GLUCOSE 129*  BUN 8  CREATININE 0.71  CALCIUM 9.0   GFR: Estimated Creatinine Clearance: 91.4 mL/min (by C-G formula based on SCr of 0.71 mg/dL). Liver Function Tests: Recent Labs  Lab 06/16/19 1103  AST 15  ALT 13  ALKPHOS 72  BILITOT 0.6  PROT 7.7  ALBUMIN 4.0   Recent Labs  Lab 06/16/19 1103  LIPASE 27   No results for input(s): AMMONIA in the last 168 hours. Coagulation Profile: No results for input(s): INR, PROTIME in the last 168 hours. Cardiac Enzymes: No results for input(s): CKTOTAL,  CKMB, CKMBINDEX, TROPONINI in the last 168 hours. BNP (last 3 results) No results for input(s): PROBNP in the last 8760 hours. HbA1C: No results for input(s): HGBA1C in the last 72 hours. CBG: No results for input(s): GLUCAP in the last 168 hours. Lipid Profile: No results for input(s): CHOL, HDL, LDLCALC, TRIG, CHOLHDL, LDLDIRECT in the last 72 hours. Thyroid Function Tests: No results for input(s): TSH, T4TOTAL, FREET4, T3FREE, THYROIDAB in the last 72 hours. Anemia Panel: No results for input(s): VITAMINB12, FOLATE, FERRITIN, TIBC, IRON, RETICCTPCT in the last 72 hours. Sepsis Labs: No results for input(s): PROCALCITON, LATICACIDVEN in the last 168 hours.  Recent Results (from the past 240 hour(s))  SARS CORONAVIRUS 2 (TAT 6-24 HRS) Nasopharyngeal Nasopharyngeal Swab     Status: None   Collection Time: 06/16/19  2:26 PM   Specimen: Nasopharyngeal Swab  Result Value Ref Range Status   SARS Coronavirus 2 NEGATIVE NEGATIVE Final    Comment: (NOTE) SARS-CoV-2 target nucleic acids are NOT DETECTED. The SARS-CoV-2 RNA is generally detectable in upper and lower respiratory specimens during the acute phase of infection. Negative results do not preclude SARS-CoV-2 infection, do not rule out co-infections with other pathogens, and should not be used as the sole basis for treatment or other patient management decisions. Negative results must be combined with clinical observations, patient history, and epidemiological information. The expected result is Negative. Fact Sheet for Patients: HairSlick.no Fact Sheet for Healthcare Providers: quierodirigir.com This test is not yet approved or cleared by the [place] FDA and  has been authorized for detection and/or diagnosis of SARS-CoV-2 by FDA under an Emergency Use Authorization (EUA). This EUA will remain  in effect (meaning this test can be used) for the duration of the COVID-19  declaration under Section 56 4(b)(1) of the Act, 21 U.S.C. section 360bbb-3(b)(1), unless the authorization is terminated or revoked sooner. Performed at [facility], [address], [place], [place] [address]      RN Pressure Injury Documentation:     Estimated body mass index is 30.41 kg/m as calculated from the following:   Height as of this encounter: 5\' 8"  (1.727 m).   Weight as of this encounter: 90.7 kg.  Malnutrition Type:      Malnutrition Characteristics:      Nutrition Interventions:     Radiology Studies: DG ABD ACUTE 2+V W 1V CHEST  Result Date: 06/16/2019 CLINICAL DATA:  Abdominal distension and vomiting EXAM: DG ABDOMEN ACUTE W/ 1V CHEST COMPARISON:  Abdomen series Jul 15, 2018 FINDINGS: PA chest: Lungs are clear. Heart size and pulmonary vascularity are normal. No adenopathy. There is postoperative change in the lower cervical spine region. Supine and left lateral decubitus abdomen: There is moderate stool in the colon. There is no appreciable bowel dilatation. There are scattered air-fluid levels. No free air. There are occasional pelvic calcifications, likely  phleboliths. Postoperative change noted in the lower lumbar spine. IMPRESSION: No bowel dilatation. Occasional air-fluid levels noted. Question a degree of enteritis or ileus. Bowel obstruction felt to be less likely. No free air. Lungs clear. Electronically Signed   By: Lowella Grip III M.D.   On: 06/16/2019 11:34   Scheduled Meds: . metoCLOPramide (REGLAN) injection  10 mg Intravenous Q6H  . nicotine  21 mg Transdermal Daily  . pantoprazole (PROTONIX) IV  40 mg Intravenous Daily  . sodium chloride flush  3 mL Intravenous Once   Continuous Infusions: . sodium chloride 125 mL/hr at 06/17/19 0400    LOS: 0 days   Kerney Elbe, DO [facility] PAGER is on AMION  If 7PM-7AM, please contact night-coverage [email or web address]

## 2019-06-17 NOTE — Consult Note (Signed)
Referring Provider: Dr. Alfredia Ferguson Primary Care Physician:  Lahoma Rocker, MD Primary Gastroenterologist:  Dr. Therisa Doyne [facility])  Reason for Consultation: Nausea and vomiting  HPI: Kristen Ramos is a 59 y.o. female with history of cyclic nausea and vomiting, RA, chronic back pain (on oxycodone q6h) presenting with intractable nausea and vomiting.  Patient states she started having nausea and vomiting 2 days ago.  She has had multiple episodes of emesis, reporting that she has been vomiting "all day" prior to being hospitalized.  Today, she has had 2 or 3 episodes of vomiting.  She is not currently able to tolerate anything by mouth.  She denies any hematemesis.  She also reports diffuse abdominal pain.  She denies any known triggers and denies any sick contacts.  She also endorses dizziness and states dizziness often accompanies her episodes of nausea and vomiting.  However, she states that the nausea precedes the dizziness.  She does not feel dizzy when she stands up but she does feel dizzy when she moves her head from side to side.  Patient states her last bowel movement was 2 days ago she does not feel constipated.  She denies dysphagia, hematochezia, melena, diarrhea.  She denies any unexplained weight loss or changes in appetite.  She was feeling well until most recent episode 2 days ago.  She denies any marijuana use.  In the past, patient has had increased nausea/vomiting with postnasal drip.  However, patient states she has been taking her allergy medicines daily and does not believe allergies or postnasal drip are currently contributing to her symptoms.  Abdominal films yesterday showed occasional air-fluid levels, question a degree of enteritis or ileus. Bowel obstruction felt to be less likely. No bowel dilatation. No free air. Lungs clear.  Patient has had extensive work-up in the past to include: -Normal gastric emptying study 07/21/2018 -EGD 04/19/2018 pertinent only for erythematous  mucosa in the antrum (bx: negative for H. Pylori, hyperplasia, dysplasia, or malignancy).  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 -Abdomen/pelvis CT 10/03/2018 with no acute intra-abdominal abnormalities. -MRCP 04/16/2018 persistent common bile duct dilation and mild intrahepatic duct dilation without obstructing stone or mass; no gallbladder abnormalities   Past Medical History:  Diagnosis Date  . Arthritis   . Chronic back pain     Past Surgical History:  Procedure Laterality Date  . ABDOMINAL HYSTERECTOMY    . BACK SURGERY    . BIOPSY  04/19/2018   Procedure: BIOPSY;  Surgeon: Ronnette Juniper, MD;  Location: [facility];  Service: Gastroenterology;;  . CESAREAN SECTION    . ESOPHAGOGASTRODUODENOSCOPY (EGD) WITH PROPOFOL N/A 04/19/2018   Procedure: ESOPHAGOGASTRODUODENOSCOPY (EGD) WITH PROPOFOL;  Surgeon: Ronnette Juniper, MD;  Location: [facility];  Service: Gastroenterology;  Laterality: N/A;    Prior to Admission medications   Medication Sig Start Date End Date Taking? Authorizing Provider  amitriptyline (ELAVIL) 25 MG tablet Take 25 mg by mouth daily. 05/21/19  Yes [provider]  cetirizine (ZYRTEC) 10 MG tablet Take 10 mg by mouth daily. 05/26/19  Yes [provider]  famotidine (PEPCID) 40 MG tablet Take 40 mg by mouth 2 (two) times daily. 03/31/19  Yes [provider]  fluticasone (FLONASE) 50 MCG/ACT nasal spray Place 2 sprays into both nostrils daily. 05/20/19  Yes [provider]  folic acid (FOLVITE) 1 MG tablet Take 1 mg by mouth daily. 04/01/19  Yes [provider]  HORIZANT 600 MG TBCR Take 1 tablet by mouth 2 (two) times daily. 06/01/19  Yes [provider]  methotrexate (RHEUMATREX) 2.5 MG tablet Take 15 mg by mouth once a week. 03/31/19  Yes [provider]  ondansetron (ZOFRAN-ODT) 8 MG disintegrating tablet Take 8 mg by mouth every 12 (twelve) hours. Pt states she takes daily q12 05/20/19  Yes  [provider]  oxyCODONE (ROXICODONE) 15 MG immediate release tablet Take 15 mg by mouth 4 (four) times daily as needed for pain.  02/03/18  Yes [provider]  sucralfate (CARAFATE) 1 GM/10ML suspension Take 1 g by mouth 4 (four) times daily. 05/20/19  Yes [provider]  tiZANidine (ZANAFLEX) 4 MG tablet Take 4 mg by mouth 2 (two) times daily as needed for muscle spasms. 09/29/18  Yes [provider]  ondansetron (ZOFRAN-ODT) 4 MG disintegrating tablet Take 1 tablet (4 mg total) by mouth every 8 (eight) hours as needed for up to 120 doses for nausea or vomiting. Patient not taking: Reported on 06/16/2019 10/07/18   Delano Metz, MD  pantoprazole (PROTONIX) 40 MG tablet Take 1 tablet (40 mg total) by mouth daily. Patient not taking: Reported on 06/16/2019 07/21/18   Guinevere Scarlet A, DO    Scheduled Meds: . metoCLOPramide (REGLAN) injection  10 mg Intravenous Q6H  . nicotine  21 mg Transdermal Daily  . pantoprazole (PROTONIX) IV  40 mg Intravenous Daily  . sodium chloride flush  3 mL Intravenous Once   Continuous Infusions: . sodium chloride 125 mL/hr at 06/17/19 0400   PRN Meds:.acetaminophen **OR** acetaminophen, ondansetron **OR** ondansetron (ZOFRAN) IV  Allergies as of 06/16/2019 - Review Complete 06/16/2019  Allergen Reaction Noted  . Bee venom Swelling 12/13/2017    History reviewed. No pertinent family history.  Social History   Socioeconomic History  . Marital status: Single    Spouse name: Not on file  . Number of children: Not on file  . Years of education: Not on file  . Highest education level: Not on file  Occupational History  . Not on file  Tobacco Use  . Smoking status: Current Every Day Smoker    Types: Cigarettes  . Smokeless tobacco: Never Used  Substance and Sexual Activity  . Alcohol use: No  . Drug use: No  . Sexual activity: Not on file  Other Topics Concern  . Not on file  Social History Narrative  . Not on  file   Social Determinants of Health   Financial Resource Strain:   . Difficulty of Paying Living Expenses:   Food Insecurity:   . Worried About Programme researcher, broadcasting/film/video in the Last Year:   . Barista in the Last Year:   Transportation Needs:   . Freight forwarder (Medical):   Marland Kitchen Lack of Transportation (Non-Medical):   Physical Activity:   . Days of Exercise per Week:   . Minutes of Exercise per Session:   Stress:   . Feeling of Stress :   Social Connections:   . Frequency of Communication with Friends and Family:   . Frequency of Social Gatherings with Friends and Family:   . Attends Religious Services:   . Active Member of Clubs or Organizations:   . Attends Banker Meetings:   Marland Kitchen Marital Status:   Intimate Partner Violence:   . Fear of Current or Ex-Partner:   . Emotionally Abused:   Marland Kitchen Physically Abused:   . Sexually Abused:     Review of Systems: Review of Systems  Constitutional: Negative for chills and fever.  HENT: Negative for congestion, hearing loss and sinus pain.   Eyes: Negative for blurred vision, photophobia and pain.  Respiratory: Negative for cough and shortness of breath.   Cardiovascular: Negative for chest pain and palpitations.  Gastrointestinal: Positive for abdominal pain, nausea and vomiting. Negative for blood in stool, constipation, diarrhea, heartburn and melena.  Genitourinary: Negative for dysuria and urgency.  Musculoskeletal: Positive for back pain. Negative for falls.  Skin: Negative for itching and rash.  Neurological: Positive for dizziness. Negative for seizures, loss of consciousness and headaches.  Psychiatric/Behavioral: Negative for substance abuse. The patient is not nervous/anxious.     Physical Exam: Vital signs: Vitals:   06/17/19 0311 06/17/19 0342  BP: (!) 137/96 114/63  Pulse: 78 79  Resp: 16 16  Temp: 97.9 F (36.6 C) (!) 97.4 F (36.3 C)  SpO2: 98% 98%   Last BM Date: 06/15/19  Physical Exam   Constitutional: She is oriented to person, place, and time. She appears well-developed and well-nourished.  Appears uncomfortable and is shifting in bed, but is not in acute distress  HENT:  Head: Normocephalic and atraumatic.  Nose: Nose normal.  Mouth/Throat: Oropharynx is clear and moist.  Eyes: Conjunctivae and EOM are normal. No scleral icterus.  Cardiovascular: Normal rate, regular rhythm, normal heart sounds and intact distal pulses.  No murmur heard. Pulmonary/Chest: Effort normal and breath sounds normal. No respiratory distress.  Abdominal: Soft. She exhibits no distension and no mass. There is abdominal tenderness (mild, diffuse). There is no guarding.  Mildly sluggish bowel sounds in bilateral lower quadrants  Musculoskeletal:        General: No edema. Normal range of motion.     Cervical back: Normal range of motion and neck supple.  Neurological: She is alert and oriented to person, place, and time.  Skin: Skin is warm and dry.  Psychiatric: She has a normal mood and affect. Her behavior is normal.   GI:  Lab Results: Recent Labs    06/16/19 1103 06/17/19 0945  WBC 5.2 5.5  HGB 13.4 13.0  HCT 40.5 38.7  PLT 297 265   BMET Recent Labs    06/16/19 1103 06/17/19 0945  NA 139 143  K 3.8 3.8  CL 107 112*  CO2 24 22  GLUCOSE 129* 106*  BUN 8 10  CREATININE 0.71 0.78  CALCIUM 9.0 8.8*   LFT Recent Labs    06/17/19 0945  PROT 7.2  ALBUMIN 3.7  AST 16  ALT 13  ALKPHOS 69  BILITOT 0.7   PT/INR No results for input(s): LABPROT, INR in the last 72 hours.   Studies/Results: DG ABD ACUTE 2+V W 1V CHEST  Result Date: 06/16/2019 CLINICAL DATA:  Abdominal distension and vomiting EXAM: DG ABDOMEN ACUTE W/ 1V CHEST COMPARISON:  Abdomen series Jul 15, 2018 FINDINGS: PA chest: Lungs are clear. Heart size and pulmonary vascularity are normal. No adenopathy. There is postoperative change in the lower cervical spine region. Supine and left lateral decubitus  abdomen: There is moderate stool in the colon. There is no appreciable bowel dilatation. There are scattered air-fluid levels. No free air. There are occasional pelvic calcifications, likely phleboliths. Postoperative change noted in the lower lumbar spine. IMPRESSION: No bowel dilatation. Occasional air-fluid levels noted. Question a degree of enteritis or ileus. Bowel obstruction felt to be less likely. No free air. Lungs clear. Electronically Signed   By: Bretta Bang III M.D.   On: 06/16/2019 11:34    Impression: -Intractable nausea and vomiting  with diffuse abdominal pain, likely secondary to chronic opioid usage.  Electrolytes remain within normal limits. -Abdominal film showing moderate stool burden and occasional air-fluid levels (ileus vs. Enteritis). -Dizziness, unknown etiology.  May be related to BPPV.  Plan: -HIDA scan today  -Head CT without contrast to rule out mass lesions or other serious causes of dizziness with concurrent nausea and vomiting. -Continue supportive care and antiemetics as needed -Continue Reglan for now.  However, patient had normal gastric emptying study, and if Reglan does not improve her symptoms by tomorrow, we will consider decreasing or discontinuing it. -After HIDA scan, we will do a MiraLAX trial to see if constipation is contributing to current symptoms. -Recommend decreasing/limiting narcotic usage.  [facility] will follow.   LOS: 0 days   Edrick Kins  PA-C 06/17/2019, 11:58 AM  Contact #  [phone]

## 2019-06-17 NOTE — Progress Notes (Signed)
Received call from radiology that patient refused to drink Ensure, stating she does not consume dairy, thus ejection fraction could not be measured during HIDA scan.  The part of the scan that was completed will be read and uploaded.

## 2019-06-18 DIAGNOSIS — E872 Acidosis: Secondary | ICD-10-CM

## 2019-06-18 LAB — COMPREHENSIVE METABOLIC PANEL
ALT: 12 U/L (ref 0–44)
AST: 17 U/L (ref 15–41)
Albumin: 3.4 g/dL — ABNORMAL LOW (ref 3.5–5.0)
Alkaline Phosphatase: 60 U/L (ref 38–126)
Anion gap: 10 (ref 5–15)
BUN: 9 mg/dL (ref 6–20)
CO2: 20 mmol/L — ABNORMAL LOW (ref 22–32)
Calcium: 8.6 mg/dL — ABNORMAL LOW (ref 8.9–10.3)
Chloride: 111 mmol/L (ref 98–111)
Creatinine, Ser: 0.75 mg/dL (ref 0.44–1.00)
GFR calc Af Amer: >60 mL/min (ref >60)
GFR calc non Af Amer: >60 mL/min (ref >60)
Glucose, Bld: 87 mg/dL (ref 70–99)
Potassium: 3.5 mmol/L (ref 3.5–5.1)
Sodium: 141 mmol/L (ref 135–145)
Total Bilirubin: 1 mg/dL (ref 0.3–1.2)
Total Protein: 6.6 g/dL (ref 6.5–8.1)

## 2019-06-18 LAB — CBC WITH DIFFERENTIAL/PLATELET
Abs Immature Granulocytes: 0.01 10*3/uL (ref 0.00–0.07)
Basophils Absolute: 0.1 10*3/uL (ref 0.0–0.1)
Basophils Relative: 1 %
Eosinophils Absolute: 0.2 10*3/uL (ref 0.0–0.5)
Eosinophils Relative: 4 %
HCT: 37.6 % (ref 36.0–46.0)
Hemoglobin: 12.4 g/dL (ref 12.0–15.0)
Immature Granulocytes: 0 %
Lymphocytes Relative: 36 %
Lymphs Abs: 2.1 10*3/uL (ref 0.7–4.0)
MCH: 29.5 pg (ref 26.0–34.0)
MCHC: 33 g/dL (ref 30.0–36.0)
MCV: 89.3 fL (ref 80.0–100.0)
Monocytes Absolute: 0.6 10*3/uL (ref 0.1–1.0)
Monocytes Relative: 10 %
Neutro Abs: 2.9 10*3/uL (ref 1.7–7.7)
Neutrophils Relative %: 49 %
Platelets: 259 10*3/uL (ref 150–400)
RBC: 4.21 MIL/uL (ref 3.87–5.11)
RDW: 13.6 % (ref 11.5–15.5)
WBC: 5.8 10*3/uL (ref 4.0–10.5)
nRBC: 0 % (ref 0.0–0.2)

## 2019-06-18 LAB — PHOSPHORUS: Phosphorus: 3.4 mg/dL (ref 2.5–4.6)

## 2019-06-18 LAB — MAGNESIUM: Magnesium: 1.9 mg/dL (ref 1.7–2.4)

## 2019-06-18 MED ORDER — SUCRALFATE 1 GM/10ML PO SUSP
1.0000 g | Freq: Three times a day (TID) | ORAL | Status: DC
  Administered 2019-06-18 – 2019-06-20 (×7): 1 g via ORAL
  Filled 2019-06-18 (×8): qty 10

## 2019-06-18 MED ORDER — SCOPOLAMINE 1 MG/3DAYS TD PT72
1.0000 | MEDICATED_PATCH | TRANSDERMAL | Status: DC
  Administered 2019-06-18: 1.5 mg via TRANSDERMAL
  Filled 2019-06-18 (×2): qty 1

## 2019-06-18 MED ORDER — POTASSIUM CHLORIDE 10 MEQ/100ML IV SOLN
10.0000 meq | INTRAVENOUS | Status: AC
  Administered 2019-06-18 (×3): 10 meq via INTRAVENOUS
  Filled 2019-06-18 (×2): qty 100

## 2019-06-18 MED ORDER — SODIUM BICARBONATE 8.4 % IV SOLN
25.0000 meq | Freq: Once | INTRAVENOUS | Status: AC
  Administered 2019-06-18: 25 meq via INTRAVENOUS
  Filled 2019-06-18: qty 25

## 2019-06-18 MED ORDER — SODIUM BICARBONATE 4.2 % IV SOLN: 25.0000 meq | Freq: Once | INTRAVENOUS | Status: DC

## 2019-06-18 MED ORDER — FLUTICASONE PROPIONATE 50 MCG/ACT NA SUSP
2.0000 | Freq: Every day | NASAL | Status: DC
  Administered 2019-06-18 – 2019-06-22 (×5): 2 via NASAL
  Filled 2019-06-18: qty 16

## 2019-06-18 MED ORDER — CETIRIZINE HCL 10 MG PO TABS
10.0000 mg | ORAL_TABLET | Freq: Every day | ORAL | Status: DC
  Administered 2019-06-18 – 2019-06-22 (×5): 10 mg via ORAL
  Filled 2019-06-18 (×5): qty 1

## 2019-06-18 MED ORDER — AMITRIPTYLINE HCL 10 MG PO TABS
10.0000 mg | ORAL_TABLET | Freq: Every day | ORAL | Status: DC
  Administered 2019-06-19 – 2019-06-20 (×2): 10 mg via ORAL
  Filled 2019-06-18 (×5): qty 1

## 2019-06-18 MED ORDER — KETOROLAC TROMETHAMINE 30 MG/ML IJ SOLN
30.0000 mg | Freq: Once | INTRAMUSCULAR | Status: AC
  Administered 2019-06-18: 30 mg via INTRAVENOUS
  Filled 2019-06-18: qty 1

## 2019-06-18 NOTE — Progress Notes (Addendum)
[facility] Gastroenterology Progress Note  Kristen Ramos 59 y.o. 11/22/1960  CC: Nausea and vomiting   Subjective: Patient reports continued nausea and vomiting.  She denies hematemesis.  She had one normal bowel movement yesterday.  She denies melena, or hematochezia.  ROS : Review of Systems  Constitutional: Negative for chills and fever.  Gastrointestinal: Positive for abdominal pain, nausea and vomiting. Negative for blood in stool, constipation, diarrhea, heartburn and melena.   Objective: Vital signs in last 24 hours: Vitals:   06/17/19 2106 06/18/19 0603  BP: 133/79 (!) 145/81  Pulse: 79 84  Resp: 16 14  Temp: 98.3 F (36.8 C) 98 F (36.7 C)  SpO2: 94%     Physical Exam:  Physical Exam  Constitutional: She is oriented to person, place, and time. She appears well-developed and well-nourished. No distress.  HENT:  Head: Normocephalic and atraumatic.  Eyes: Conjunctivae and EOM are normal. No scleral icterus.  Cardiovascular: Normal rate, regular rhythm and normal heart sounds.  Pulmonary/Chest: Effort normal and breath sounds normal. No respiratory distress.  Abdominal: Soft. Bowel sounds are normal. She exhibits no distension and no mass. There is no abdominal tenderness. There is no rebound and no guarding.  Musculoskeletal:        General: No deformity or edema.  Neurological: She is alert and oriented to person, place, and time.  Skin: Skin is warm and dry.  Psychiatric: She has a normal mood and affect. Her behavior is normal.    Lab Results: Recent Labs    06/17/19 0945 06/18/19 0539  NA 143 141  K 3.8 3.5  CL 112* 111  CO2 22 20*  GLUCOSE 106* 87  BUN 10 9  CREATININE 0.78 0.75  CALCIUM 8.8* 8.6*  MG 2.1 1.9  PHOS 3.1 3.4   Recent Labs    06/17/19 0945 06/18/19 0539  AST 16 17  ALT 13 12  ALKPHOS 69 60  BILITOT 0.7 1.0  PROT 7.2 6.6  ALBUMIN 3.7 3.4*   Recent Labs    06/17/19 0945 06/18/19 0539  WBC 5.5 5.8  NEUTROABS 3.6 2.9  HGB  13.0 12.4  HCT 38.7 37.6  MCV 89.8 89.3  PLT 265 259   No results for input(s): LABPROT, INR in the last 72 hours.    Assessment:   Cyclic nausea and vomiting.  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  HIDA scan showed normal gallbladder (EF was unable to be completed due to patient refusal of Ensure).  Head CT negative for mass or lesions.  Plan: -Recommend amitriptyline 10mg  at night.  If tolerated consider increasing dose to 20mg  after 1 month. -Continue supportive therapy with antiemetics as needed -Continue Protonix qd (transition to PO at discharge) -Limit opioid usage -Patient to schedule 1 month follow-up with Dr. at [facility].  [facility] will sign off. Please contact Marca Ancona if we can be of any further assistance during this hospital stay.  [facility] PA-C 06/18/2019, 11:59 AM  Contact #  [phone]

## 2019-06-18 NOTE — Progress Notes (Signed)
PROGRESS NOTE    Kristen Ramos  JGG:[id number] DOB: Jan 27, 1961 DOA: 06/16/2019 PCP: Lahoma Rocker, MD   Brief Narrative:  HPI per Dr. Tennis Must on 06/16/2019  Kristen Ramos is a 59 y.o. female with medical history significant of rheumatoid arthritis, chronic back pain, cyclical vomiting who is coming to the emergency department due to multiple episodes of nausea and emesis since yesterday.  The symptoms are typical of her cyclical vomiting flares.  She denies fever, chills, night sweats, hematemesis, diarrhea, melena or hematochezia.  She denies dyspnea, chest pain, palpitations, dizziness, diaphoresis, PND, orthopnea or pitting edema of the lower extremities.  No dysuria, frequency or materia.  Denies polyuria, polydipsia, polyphagia or blurred vision.  ED Course: Initial vital signs temperature 97.5 F, pulse 71, respiration of 13, blood pressure 130/85 mmHg and O2 sat 93% on room air.  The patient received a 1000 mL NS bolus, 8 mg of morphine IVP, 10 mg of metoclopramide IVP and 0.5 mg lorazepam IV.  CBC was normal.  Lipase was normal.  CMP shows a glucose of 129 mg/dL, all other values are within normal range.  Three-way abdomen does not show any bowel dilatation, there is occasional air-fluid levels noted.  There is questionable enteritis or ileus.  Bowel obstruction was felt to be less likely.  Please see images and full radiology report for further detail.  **Interim History  She continued to have intractable nausea or vomiting spite being on scheduled Reglan and as needed Zofran.  Because of this gastroenterology was consulted for further evaluation and recommendations and they are recommending a HIDA scan and head CT scan without contrast to rule out mass lesions or other serious causes of dizziness concurrent nausea and vomiting.  She continues to endorse diffuse abdominal pain but gastroenterology feels this may be secondary to chronic opioid usage.  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  GI recommends continue as needed antiemetics and continue Protonix need added Carafate.  GI was in agreement with the scopolamine patch the patient continues to be extremely upset at the reason why she continues to have nausea vomiting and believes it secondary to her hiatal hernia however her hiatal hernia was very small per my discussion with Dr. Alessandra Bevels.  Assessment & Plan:   Principal Problem:   Intractable cyclical vomiting Active Problems:   Rheumatoid arteritis (HCC)  Intractable Cyclical Nausea and Vomiting Associated with abdominal pain; continues vomiting and dry heaving occasionally -Admit to Med/Surge . -Initially kept n.p.o. but now GI recommending a soft diet -Continue IV fluids with NS at 125 mL/hr and will reduce to 75 mL's per hour -D/G Abdomen showed "No bowel dilatation. Occasional air-fluid levels noted. Question a degree of enteritis or ileus. Bowel obstruction felt to be less likely. No free air. Lungs clear." -C/w Analgesics as needed with Judicious use of Narcotics -Received IV Fentanyl 25 mcg yesterday -C/w Antiemetics with Ondansetron 4 mg po/IV q6hprn Nausea and  -Was on metoclopramide 10 mg IV q6h however now the metoclopramide has been stopped -Because had no improvement Gastroenterology was consulted for further evaluation recommendations and recommending a HIDA scan today as well as a head CT scan to rule out mass lesions or other serious causes with dizziness with concurrent nausea or vomiting -HIDA showed that the cyst in the common bile duct were patent however no EF was done as patient was unable  to drink the Ensure portion of the to calculate the ejection fraction -Head CT done and showed no acute intracranial hemorrhage, mass-effect or evidence of acute infarction -Gastroenterology recommend continue supportive  care as well as antiemetics and they were in agreement with the Scopolamine patch that I have ordered -They also recommend a trial of MiraLAX to see if constipation is contributing to her symptoms and recommending decreasing/limiting narcotic usage -Continue with IV PPI with Pantoprazole 40 mg in the a.m. added Carafate -We will monitor patient's clinical response to intervention and follow up on gastroenterology recommendations -Check UDS and U/A and still pending essentially unremarkable except that she had positive opiates in her UDS -Continue to monitor carefully and work-up for her vestibular issues as below  Metabolic Acidosis -Patient's CO2 is 20, chloride level 111, anion gap was 10 -Used her normal saline rate from 125 mL/h to 75 mL/hr -Continue to Monitor and Trend -Repeat CMP in AM   Rheumatoid Arteritis (HCC) -Hold Methotrexate. -Judicious use of opiate pain medications -Continue acetaminophen  Obesity -Estimated body mass index is 30.41 kg/m as calculated from the following:   Height as of this encounter: _0  (1.727 m).   Weight as of this encounter: 90.7 kg. -Weight Loss and Dietary Counseling given   Hyperglycemia in a patient with a history of prediabetes -Check HbA1c in the AM as last hemoglobin A1c was 5.9 almost a year ago -Blood sugars have been ranging from 87-129 on daily CMP's -If necessary will place on sensitive NovoLog/scale insulin  Tobacco Abuse -Smoking cessation counseling given Continue nicotine 21 mg transdermally every 24 hours daily  Dizziness -Head CT being obtained by Gastroenterology was negative and showed no acute intracranial hemorrhage, mass effect, or evidence of acute infarction -Will try Scopolamine Patch -Will also obtain a PT Vestibular Consult -Will also consider MRI but Patient does not want to do that currently as she wants to be "put to sleep." -She may have something vestibular going on and will test this to see if this is  contributing to her dizziness which is then in turn causing her nausea and vomiting  DVT prophylaxis: Enoxaparin 40 mg sq q24h Code Status: FULL CODE  Family Communication: No family present at bedside  Disposition Plan: Pending further gastroenterology evaluation and diet tolerance.  Patient is from home and likely will be discharged back home when she is able to tolerate a diet and is no longer nauseous and vomiting.  She has several studies for further work-up that are needing to be done and she is being continued on antiemetics  Consultants:   Gastroenterology    Procedures:  HIDA Scan    Antimicrobials:  Anti-infectives (From admission, onward)   None     Subjective: Seen and examined at bedside and she is extremely upset about what the GI doctor told her and started complaining significantly that "no one listens to her".  She states that she was having "3 yogurts and a homemade mac & cheese and then she ended up in the hospital".  She has had negative studies and attributes that her symptoms are secondary to her hiatal hernia.  She continues to have some nausea and vomiting and she vomited last night.  She did state that she becomes dizzy and then starts having these issues.  Does not want to pursue an MRI at this time but is willing to try a scopolamine patch.  No other concerns or complaint at this time and denies any chest pain.  Objective:  Vitals:   06/17/19 1525 06/17/19 2106 06/18/19 0603 06/18/19 1402  BP: 140/82 133/79 (!) 145/81 (!) 144/80  Pulse: 76 79 84 73  Resp: _0 Temp: 98.3 F (36.8 C) 98.3 F (36.8 C) 98 F (36.7 C) 98.1 F (36.7 C)  TempSrc: Oral Oral Axillary Oral  SpO2: 100% 94%  98%  Weight:      Height:        Intake/Output Summary (Last 24 hours) at 06/18/2019 1625 Last data filed at 06/18/2019 0712 Gross per 24 hour  Intake --  Output 400 ml  Net -400 ml   Filed Weights   06/17/19 0500  Weight: 90.7 kg   Examination: Physical  Exam:  Constitutional: WN/WD obese African-American female who appears uncomfortable and is very upset and agitated Eyes: Lids and conjunctivae normal, sclerae anicteric  ENMT: External Ears, Nose appear normal. Grossly normal hearing.  Neck: Appears normal, supple, no cervical masses, normal ROM, no appreciable thyromegaly; no JVD Respiratory: Diminished to auscultation bilaterally, no wheezing, rales, rhonchi or crackles. Normal respiratory effort and patient is not tachypenic. No accessory muscle use.  Unlabored breathing Cardiovascular: RRR, no murmurs / rubs / gallops. S1 and S2 auscultated. No extremity edema.  Abdomen: Soft, non-tender, distended secondary body habitus. Bowel sounds positive x4.  GU: Deferred. Musculoskeletal: No clubbing / cyanosis of digits/nails. No joint deformity upper and lower extremities.  Skin: No rashes, lesions, ulcers on limited skin evaluation. No induration; Warm and dry.  Neurologic: CN 2-12 grossly intact with no focal deficits. Romberg sign and cerebellar reflexes not assessed.  Psychiatric: Normal judgment and insight. Alert and oriented x 3.  Extremely upset and agitated mood and appropriate affect.   Data Reviewed: I have personally reviewed following labs and imaging studies  CBC: Recent Labs  Lab 06/16/19 1103 06/17/19 0945 06/18/19 0539  WBC 5.2 5.5 5.8  NEUTROABS  --  3.6 2.9  HGB 13.4 13.0 12.4  HCT 40.5 38.7 37.6  MCV 88.8 89.8 89.3  PLT 297 265 197   Basic Metabolic Panel: Recent Labs  Lab 06/16/19 1103 06/17/19 0945 06/18/19 0539  NA 139 143 141  K 3.8 3.8 3.5  CL 107 112* 111  CO2 24 22 20*  GLUCOSE 129* 106* 87  BUN _1 CREATININE 0.71 0.78 0.75  CALCIUM 9.0 8.8* 8.6*  MG  --  2.1 1.9  PHOS  --  3.1 3.4   GFR: Estimated Creatinine Clearance: 91.4 mL/min (by C-G formula based on SCr of 0.75 mg/dL). Liver Function Tests: Recent Labs  Lab 06/16/19 1103 06/17/19 0945 06/18/19 0539  AST _2 ALT _3 ALKPHOS 72 69 60  BILITOT 0.6 0.7 1.0  PROT 7.7 7.2 6.6  ALBUMIN 4.0 3.7 3.4*   Recent Labs  Lab 06/16/19 1103  LIPASE 27   No results for input(s): AMMONIA in the last 168 hours. Coagulation Profile: No results for input(s): INR, PROTIME in the last 168 hours. Cardiac Enzymes: No results for input(s): CKTOTAL, CKMB, CKMBINDEX, TROPONINI in the last 168 hours. BNP (last 3 results) No results for input(s): PROBNP in the last 8760 hours. HbA1C: No results for input(s): HGBA1C in the last 72 hours. CBG: No results for input(s): GLUCAP in the last 168 hours. Lipid Profile: No results for input(s): CHOL, HDL, LDLCALC, TRIG, CHOLHDL, LDLDIRECT in the last 72 hours. Thyroid Function Tests: No results for input(s): TSH, T4TOTAL, FREET4, T3FREE, THYROIDAB in the last 72 hours.  Anemia Panel: No results for input(s): VITAMINB12, FOLATE, FERRITIN, TIBC, IRON, RETICCTPCT in the last 72 hours. Sepsis Labs: No results for input(s): PROCALCITON, LATICACIDVEN in the last 168 hours.  Recent Results (from the past 240 hour(s))  SARS CORONAVIRUS 2 (TAT 6-24 HRS) Nasopharyngeal Nasopharyngeal Swab     Status: None   Collection Time: 06/16/19  2:26 PM   Specimen: Nasopharyngeal Swab  Result Value Ref Range Status   SARS Coronavirus 2 NEGATIVE NEGATIVE Final    Comment: (NOTE) SARS-CoV-2 target nucleic acids are NOT DETECTED. The SARS-CoV-2 RNA is generally detectable in upper and lower respiratory specimens during the acute phase of infection. Negative results do not preclude SARS-CoV-2 infection, do not rule out co-infections with other pathogens, and should not be used as the sole basis for treatment or other patient management decisions. Negative results must be combined with clinical observations, patient history, and epidemiological information. The expected result is Negative. Fact Sheet for Patients: SugarRoll.be Fact Sheet for Healthcare  Providers: https://www.woods-mathews.com/ This test is not yet approved or cleared by the [place] FDA and  has been authorized for detection and/or diagnosis of SARS-CoV-2 by FDA under an Emergency Use Authorization (EUA). This EUA will remain  in effect (meaning this test can be used) for the duration of the COVID-19 declaration under Section 56 4(b)(1) of the Act, 21 U.S.C. section 360bbb-3(b)(1), unless the authorization is terminated or revoked sooner. Performed at [facility], [place] [address], [place], [place] [address]      RN Pressure Injury Documentation:     Estimated body mass index is 30.41 kg/m as calculated from the following:   Height as of this encounter: _0  (1.727 m).   Weight as of this encounter: 90.7 kg.  Malnutrition Type:      Malnutrition Characteristics:      Nutrition Interventions:     Radiology Studies: CT HEAD WO CONTRAST  Result Date: 06/17/2019 CLINICAL DATA:  Dizziness EXAM: CT HEAD WITHOUT CONTRAST TECHNIQUE: Contiguous axial images were obtained from the base of the skull through the vertex without intravenous contrast. COMPARISON:  None. FINDINGS: Brain: There is no acute intracranial hemorrhage, mass effect, or edema. Gray-white differentiation is preserved. There is no extra-axial fluid collection. Ventricles and sulci are within normal limits in size and configuration. Vascular: No hyperdense vessel or unexpected calcification. Skull: Calvarium is unremarkable. Sinuses/Orbits: Patchy ethmoid mucosal thickening. Orbits are unremarkable. Other: Mastoid air cells are clear. IMPRESSION: No acute intracranial hemorrhage, mass effect, or evidence of acute infarction. Electronically Signed   By: Macy Mis M.D.   On: 06/17/2019 14:44   NM Hepatobiliary Liver Func  Result Date: 06/17/2019 CLINICAL DATA:  Nausea, vomiting, abdominal pain EXAM: NUCLEAR MEDICINE HEPATOBILIARY IMAGING TECHNIQUE: Sequential images of the  abdomen were obtained out to 60 minutes following intravenous administration of radiopharmaceutical. RADIOPHARMACEUTICALS:  5.5 mCi Tc-72m Choletec IV COMPARISON:  None. FINDINGS: Prompt uptake and biliary excretion of activity by the liver is seen. Gallbladder activity is visualized at 20 minutes, consistent with patency of cystic duct. Biliary activity passes into small bowel, visualized at 25 minutes, consistent with patent common bile duct. IMPRESSION: The cystic and common bile ducts are patent. Electronically Signed   By: AEddie CandleM.D.   On: 06/17/2019 15:14   Scheduled Meds:  amitriptyline  10 mg Oral QHS   cetirizine  10 mg Oral Daily   enoxaparin (LOVENOX) injection  40 mg Subcutaneous Q24H   fluticasone  2 spray Each Nare Daily  nicotine  21 mg Transdermal Daily   pantoprazole (PROTONIX) IV  40 mg Intravenous Daily   polyethylene glycol  17 g Oral Daily   scopolamine  1 patch Transdermal Q72H   sodium chloride flush  3 mL Intravenous Once   sucralfate  1 g Oral TID WC & HS   Continuous Infusions:  sodium chloride 125 mL/hr at 06/18/19 1247    LOS: 1 day   Kerney Elbe, DO [facility] PAGER is on AMION  If 7PM-7AM, please contact night-coverage [email or web address]

## 2019-06-19 ENCOUNTER — Inpatient Hospital Stay (HOSPITAL_COMMUNITY): Payer: Medicare Other

## 2019-06-19 DIAGNOSIS — G9340 Encephalopathy, unspecified: Secondary | ICD-10-CM

## 2019-06-19 LAB — CBC WITH DIFFERENTIAL/PLATELET
Abs Immature Granulocytes: 0.03 10*3/uL (ref 0.00–0.07)
Basophils Absolute: 0.1 10*3/uL (ref 0.0–0.1)
Basophils Relative: 1 %
Eosinophils Absolute: 0 10*3/uL (ref 0.0–0.5)
Eosinophils Relative: 1 %
HCT: 41.5 % (ref 36.0–46.0)
Hemoglobin: 13.8 g/dL (ref 12.0–15.0)
Immature Granulocytes: 1 %
Lymphocytes Relative: 18 %
Lymphs Abs: 1 10*3/uL (ref 0.7–4.0)
MCH: 29.8 pg (ref 26.0–34.0)
MCHC: 33.3 g/dL (ref 30.0–36.0)
MCV: 89.6 fL (ref 80.0–100.0)
Monocytes Absolute: 0.2 10*3/uL (ref 0.1–1.0)
Monocytes Relative: 4 %
Neutro Abs: 4.3 10*3/uL (ref 1.7–7.7)
Neutrophils Relative %: 75 %
Platelets: 252 10*3/uL (ref 150–400)
RBC: 4.63 MIL/uL (ref 3.87–5.11)
RDW: 13.5 % (ref 11.5–15.5)
WBC: 5.6 10*3/uL (ref 4.0–10.5)
nRBC: 0 % (ref 0.0–0.2)

## 2019-06-19 LAB — COMPREHENSIVE METABOLIC PANEL
ALT: 29 U/L (ref 0–44)
ALT: <5 U/L (ref 0–44)
AST: 33 U/L (ref 15–41)
AST: 38 U/L (ref 15–41)
Albumin: 4.1 g/dL (ref 3.5–5.0)
Albumin: 4.2 g/dL (ref 3.5–5.0)
Alkaline Phosphatase: 69 U/L (ref 38–126)
Alkaline Phosphatase: 81 U/L (ref 38–126)
Anion gap: 18 — ABNORMAL HIGH (ref 5–15)
Anion gap: 18 — ABNORMAL HIGH (ref 5–15)
BUN: 6 mg/dL (ref 6–20)
BUN: 7 mg/dL (ref 6–20)
CO2: 18 mmol/L — ABNORMAL LOW (ref 22–32)
CO2: 16 mmol/L — ABNORMAL LOW (ref 22–32)
Calcium: 9.3 mg/dL (ref 8.9–10.3)
Calcium: 9 mg/dL (ref 8.9–10.3)
Chloride: 103 mmol/L (ref 98–111)
Chloride: 102 mmol/L (ref 98–111)
Creatinine, Ser: 0.69 mg/dL (ref 0.44–1.00)
Creatinine, Ser: 0.92 mg/dL (ref 0.44–1.00)
GFR calc Af Amer: >60 mL/min (ref >60)
GFR calc Af Amer: >60 mL/min (ref >60)
GFR calc non Af Amer: >60 mL/min (ref >60)
GFR calc non Af Amer: >60 mL/min (ref >60)
Glucose, Bld: 145 mg/dL — ABNORMAL HIGH (ref 70–99)
Glucose, Bld: 182 mg/dL — ABNORMAL HIGH (ref 70–99)
Potassium: 3.7 mmol/L (ref 3.5–5.1)
Potassium: 3.3 mmol/L — ABNORMAL LOW (ref 3.5–5.1)
Sodium: 139 mmol/L (ref 135–145)
Sodium: 136 mmol/L (ref 135–145)
Total Bilirubin: 1 mg/dL (ref 0.3–1.2)
Total Bilirubin: 1 mg/dL (ref 0.3–1.2)
Total Protein: 8 g/dL (ref 6.5–8.1)
Total Protein: 8.2 g/dL — ABNORMAL HIGH (ref 6.5–8.1)

## 2019-06-19 LAB — BLOOD GAS, ARTERIAL
Acid-base deficit: 1.6 mmol/L (ref 0.0–2.0)
Bicarbonate: 21 mmol/L (ref 20.0–28.0)
FIO2: 21
O2 Saturation: 94.4 %
Patient temperature: 98.6
pCO2 arterial: 30.4 mmHg — ABNORMAL LOW (ref 32.0–48.0)
pH, Arterial: 7.454 — ABNORMAL HIGH (ref 7.350–7.450)
pO2, Arterial: 78.6 mmHg — ABNORMAL LOW (ref 83.0–108.0)

## 2019-06-19 LAB — MAGNESIUM
Magnesium: 2 mg/dL (ref 1.7–2.4)
Magnesium: 2 mg/dL (ref 1.7–2.4)

## 2019-06-19 LAB — LACTIC ACID, PLASMA
Lactic Acid, Venous: 1.1 mmol/L (ref 0.5–1.9)
Lactic Acid, Venous: 2.9 mmol/L (ref 0.5–1.9)

## 2019-06-19 LAB — PHOSPHORUS
Phosphorus: 2.5 mg/dL (ref 2.5–4.6)
Phosphorus: 2.3 mg/dL — ABNORMAL LOW (ref 2.5–4.6)

## 2019-06-19 MED ORDER — MECLIZINE HCL 25 MG PO TABS: 25.0000 mg | ORAL_TABLET | Freq: Three times a day (TID) | ORAL | Status: DC | PRN

## 2019-06-19 MED ORDER — POTASSIUM PHOSPHATES 15 MMOLE/5ML IV SOLN
20.0000 mmol | Freq: Once | INTRAVENOUS | Status: AC
  Administered 2019-06-20: 20 mmol via INTRAVENOUS
  Filled 2019-06-19: qty 6.67

## 2019-06-19 MED ORDER — IOHEXOL 300 MG/ML  SOLN
100.0000 mL | Freq: Once | INTRAMUSCULAR | Status: AC | PRN
  Administered 2019-06-19: 100 mL via INTRAVENOUS

## 2019-06-19 MED ORDER — IOHEXOL 9 MG/ML PO SOLN
ORAL | Status: AC
  Administered 2019-06-19: 500 mL
  Filled 2019-06-19: qty 1000

## 2019-06-19 MED ORDER — SODIUM CHLORIDE 0.9 % IV BOLUS
500.0000 mL | Freq: Once | INTRAVENOUS | Status: AC
  Administered 2019-06-19: 500 mL via INTRAVENOUS

## 2019-06-19 MED ORDER — SODIUM CHLORIDE (PF) 0.9 % IJ SOLN
INTRAMUSCULAR | Status: AC
  Filled 2019-06-19: qty 50

## 2019-06-19 MED ORDER — SODIUM BICARBONATE-DEXTROSE 150-5 MEQ/L-% IV SOLN
150.0000 meq | INTRAVENOUS | Status: DC
  Administered 2019-06-19: 150 meq via INTRAVENOUS
  Filled 2019-06-19 (×3): qty 1000

## 2019-06-19 MED ORDER — SODIUM CHLORIDE 0.9 % IV SOLN: INTRAVENOUS | Status: DC | PRN

## 2019-06-19 MED ORDER — LORAZEPAM 2 MG/ML IJ SOLN
2.0000 mg | Freq: Once | INTRAMUSCULAR | Status: AC | PRN
  Administered 2019-06-19: 2 mg via INTRAVENOUS
  Filled 2019-06-19: qty 1

## 2019-06-19 MED ORDER — PROMETHAZINE HCL 25 MG/ML IJ SOLN: 6.2500 mg | Freq: Once | INTRAMUSCULAR | Status: DC

## 2019-06-19 MED ORDER — PROMETHAZINE HCL 25 MG/ML IJ SOLN
25.0000 mg | Freq: Once | INTRAMUSCULAR | Status: AC
  Administered 2019-06-19: 25 mg via INTRAVENOUS
  Filled 2019-06-19: qty 1

## 2019-06-19 MED ORDER — POTASSIUM CHLORIDE 10 MEQ/100ML IV SOLN
10.0000 meq | INTRAVENOUS | Status: AC
  Administered 2019-06-19 – 2019-06-20 (×4): 10 meq via INTRAVENOUS
  Filled 2019-06-19 (×4): qty 100

## 2019-06-19 MED ORDER — IOHEXOL 9 MG/ML PO SOLN
500.0000 mL | ORAL | Status: AC
  Administered 2019-06-19: 500 mL via ORAL

## 2019-06-19 MED ORDER — SODIUM BICARBONATE-DEXTROSE 150-5 MEQ/L-% IV SOLN
150.0000 meq | INTRAVENOUS | Status: DC
  Administered 2019-06-19: 150 meq via INTRAVENOUS
  Filled 2019-06-19: qty 1000

## 2019-06-19 NOTE — Progress Notes (Signed)
PROGRESS NOTE    Kristen Ramos  ZOX:[id number] DOB: 1960/09/21 DOA: 06/16/2019 PCP: Lahoma Rocker, MD   Brief Narrative:  HPI per Dr. Tennis Must on 06/16/2019  Kristen Ramos is a 59 y.o. female with medical history significant of rheumatoid arthritis, chronic back pain, cyclical vomiting who is coming to the emergency department due to multiple episodes of nausea and emesis since yesterday.  The symptoms are typical of her cyclical vomiting flares.  She denies fever, chills, night sweats, hematemesis, diarrhea, melena or hematochezia.  She denies dyspnea, chest pain, palpitations, dizziness, diaphoresis, PND, orthopnea or pitting edema of the lower extremities.  No dysuria, frequency or materia.  Denies polyuria, polydipsia, polyphagia or blurred vision.  ED Course: Initial vital signs temperature 97.5 F, pulse 71, respiration of 13, blood pressure 130/85 mmHg and O2 sat 93% on room air.  The patient received a 1000 mL NS bolus, 8 mg of morphine IVP, 10 mg of metoclopramide IVP and 0.5 mg lorazepam IV.  CBC was normal.  Lipase was normal.  CMP shows a glucose of 129 mg/dL, all other values are within normal range.  Three-way abdomen does not show any bowel dilatation, there is occasional air-fluid levels noted.  There is questionable enteritis or ileus.  Bowel obstruction was felt to be less likely.  Please see images and full radiology report for further detail.  **Interim History  She continued to have intractable nausea or vomiting spite being on scheduled Reglan and as needed Zofran.  Because of this gastroenterology was consulted for further evaluation and recommendations and they are recommending a HIDA scan and head CT scan without contrast to rule out mass lesions or other serious causes of dizziness concurrent nausea and vomiting.    She continues to endorse diffuse abdominal pain but gastroenterology feels this may be secondary to chronic opioid usage.  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  GI recommended continuing as needed antiemetics and continue Protonix need added Carafate.  GI was in agreement with the scopolamine patch the patient continues to be extremely upset at the reason why she continues to have nausea vomiting and believes it secondary to her hiatal hernia however her hiatal hernia was very small per my discussion with Dr. Alessandra Bevels.  She continues to be nauseous with abdominal pain and vomiting so GI is ordered her a CT abdomen and pelvis.  She acutely became confused this afternoon per nursing and had a metabolic acidosis so she was started on sodium bicarbonate drip and an MRI was done.  Because of her confusion she also had an ABG.  MRI was unrevealing.  Patient did have some dizziness when she turns her head to the side with question for BPPV.  Assessment & Plan:   Principal Problem:   Intractable cyclical vomiting Active Problems:   Rheumatoid arteritis (HCC)  Intractable Cyclical Nausea and Vomiting Associated with abdominal pain; continues vomiting and dry heaving occasionally -Admit to Med/Surge . -Initially kept n.p.o. but now GI recommending a soft diet; continues to be nauseous and having abdominal pain and vomiting -Change IV fluids to sodium bicarbonate given her metabolic acidosis -D/G Abdomen showed "No bowel dilatation. Occasional air-fluid levels noted. Question a degree of enteritis or ileus. Bowel obstruction felt to be less likely. No free air. Lungs clear." -C/w Analgesics as needed with Judicious use of Narcotics -C/w Antiemetics with Ondansetron 4 mg  po/IV q6hprn Nausea and  -Was on metoclopramide 10 mg IV q6h however now the metoclopramide has been stopped -Because had no improvement Gastroenterology was consulted for further evaluation recommendations and recommending a HIDA scan today as well  as a head CT scan to rule out mass lesions or other serious causes with dizziness with concurrent nausea or vomiting -HIDA showed that the cyst in the common bile duct were patent however no EF was done as patient was unable to drink the Ensure portion of the to calculate the ejection fraction -Head CT done and showed no acute intracranial hemorrhage, mass-effect or evidence of acute infarction -Gastroenterology recommend continue supportive care as well as antiemetics and they were in agreement with the Scopolamine patch that I have ordered -They also recommend a trial of MiraLAX to see if constipation is contributing to her symptoms and recommending decreasing/limiting narcotic usage -Continue with IV PPI with Pantoprazole 40 mg in the a.m. added Carafate -We will monitor patient's clinical response to intervention and follow up on gastroenterology recommendations -Check UDS and U/A and still pending essentially unremarkable except that she had positive opiates in her UDS -Gastroenterology also checked CT abdomen pelvis with contrast and showed "Mild diffuse thickened appearance of the colon, likely related to underdistention. Colitis is less likely. Clinical correlation is recommended. No bowel obstruction. Normal appendix. Colonic diverticulosis. Aortic Atherosclerosis." -Continue to monitor carefully and work-up for her vestibular issues as below and have ordered an MRI today which was negative -I have asked PT to do vestibular testing with patient  High anion gap metabolic Acidosis Lactic acidosis -Patient's CO2 is 20, chloride level 111, anion gap was 10 yesterday and today it is worsened with an anion gap of 18, CO2 of 16, and chloride level 102 -Fluids changed to sodium bicarbonate at 75 MLS per hour; normal saline 500 mL -Continue to Monitor and Trend -Repeat CMP in AM   Rheumatoid Arteritis (HCC) -Hold Methotrexate. -Judicious use of opiate pain medications -Continue  acetaminophen  Obesity -Estimated body mass index is 30.41 kg/m as calculated from the following:   Height as of this encounter: 5\' 8"  (1.727 m).   Weight as of this encounter: 90.7 kg. -Weight Loss and Dietary Counseling given   Hyperglycemia in a patient with a history of prediabetes -Check HbA1c in the AM as last hemoglobin A1c was 5.9 almost a year ago -Blood sugars have been ranging from 87-129 on daily CMP's -If necessary will place on sensitive NovoLog/scale insulin  Tobacco Abuse -Smoking cessation counseling given Continue nicotine 21 mg transdermally every 24 hours daily  Dizziness -Head CT being obtained by Gastroenterology was negative and showed no acute intracranial hemorrhage, mass effect, or evidence of acute infarction -Will try Scopolamine Patch patient still continues to be dizzy with movement to the right -Will also obtain a PT Vestibular Consult -MRI done and as below -Try Meclizine -She may have something vestibular going on and will test this to see if this is contributing to her dizziness which is then in turn causing her nausea and vomiting  Acute encephalopathy and confusion -Unclear etiology but ordered an ABG which showed metabolic alkalosis -MRI done and showed "Partial study demonstrate no acute infarction. -Continue with Delirium precautions -Obtain blood cultures x2 -ABG done and showed pH of 7.454, PCO2 of 30.4, PO2 of 78.6, ABG O2 saturation of 94.4% with a bicarbonate level of 21.0 and FiO2 of 21.0  Hypophosphatemia -Patient's Phos level was 2.3 -Replete with IV K-Phos 20 mmol -Continue monitor  and replete as necessary -Repeat phosphorus level in a.m.  Hypokalemia -Patient's potassium this morning was normal at 3.7 on repeat this afternoon was 3.3 -Replete with K-Phos IV 20 mmol as well as IV KCl 40 mEq -Continue to monitor and replete as necessary -Repeat CMP in a.m.  DVT prophylaxis: Enoxaparin 40 mg sq q24h Code Status: FULL CODE   Family Communication: No family present at bedside  Disposition Plan: Pending further gastroenterology evaluation and diet tolerance.  Patient is from home and likely will be discharged back home when she is able to tolerate a diet and is no longer nauseous and vomiting.  She has several studies for further work-up that are needing to be done and she is being continued on antiemetics  Consultants:   Gastroenterology    Procedures:  HIDA Scan  CT Abd/Pelvis MRI   Antimicrobials:  Anti-infectives (From admission, onward)   None     Subjective: Seen and examined at bedside and this a.m. feels extremely uncomfortable and was bent over and doubled over being nauseous.  States that whenever she turns her head to the right she becomes extremely dizzy.  States that her dizziness has improved though.  Continues to be nauseous and states that she vomited at least 4 times.  Denies any other concerns or complaints and denies any chest pain.  She acutely became confused in the afternoon and further work-up was initiated.  Objective: Vitals:   06/18/19 0603 06/18/19 1402 06/18/19 2122 06/19/19 0443  BP: (!) 145/81 (!) 144/80 (!) 150/85 (!) 151/83  Pulse: 84 73 60 70  Resp: 14 18 16    Temp: 98 F (36.7 C) 98.1 F (36.7 C) 97.9 F (36.6 C) 98.2 F (36.8 C)  TempSrc: Axillary Oral Oral Oral  SpO2:  98% 97%   Weight:      Height:       No intake or output data in the 24 hours ending 06/19/19 1928 Filed Weights   06/17/19 0500  Weight: 90.7 kg   Examination: Physical Exam:  Constitutional: WN/WD obese African-American female who does appear uncomfortable and nauseous Eyes: Lids and conjunctivae normal, sclerae anicteric  ENMT: External Ears, Nose appear normal. Grossly normal hearing. Neck: Appears normal, supple, no cervical masses, normal ROM, no appreciable thyromegaly Respiratory: Diminished to auscultation bilaterally, no wheezing, rales, rhonchi or crackles. Normal respiratory  effort and patient is not tachypenic. No accessory muscle use.  Unlabored breathing Cardiovascular: RRR, no murmurs / rubs / gallops. S1 and S2 auscultated.  No appreciable lower extremity edema Abdomen: Soft, tender to palpate, non-distended. Bowel sounds positive.  GU: Deferred. Musculoskeletal: No clubbing / cyanosis of digits/nails. No joint deformity upper and lower extremities.   Skin: No rashes, lesions, ulcers on limited skin evaluation. No induration; Warm and dry.  Neurologic: CN 2-12 grossly intact with no focal deficits. Romberg sign cerebellar reflexes not assessed.  Psychiatric: Normal judgment and insight. Alert and oriented x 3.  Appears very anxious and uncomfortable.    Data Reviewed: I have personally reviewed following labs and imaging studies  CBC: Recent Labs  Lab 06/16/19 1103 06/17/19 0945 06/18/19 0539 06/19/19 0539  WBC 5.2 5.5 5.8 5.6  NEUTROABS  --  3.6 2.9 4.3  HGB 13.4 13.0 12.4 13.8  HCT 40.5 38.7 37.6 41.5  MCV 88.8 89.8 89.3 89.6  PLT 297 265 259 252   Basic Metabolic Panel: Recent Labs  Lab 06/16/19 1103 06/17/19 0945 06/18/19 0539 06/19/19 0539 06/19/19 1323  NA 139 143 141 139  136  K 3.8 3.8 3.5 3.7 3.3*  CL 107 112* 111 103 102  CO2 24 22 20* 18* 16*  GLUCOSE 129* 106* 87 145* 182*  BUN 8 10 9 6 7   CREATININE 0.71 0.78 0.75 0.69 0.92  CALCIUM 9.0 8.8* 8.6* 9.3 9.0  MG  --  2.1 1.9 2.0 2.0  PHOS  --  3.1 3.4 2.5 2.3*   GFR: Estimated Creatinine Clearance: 79.5 mL/min (by C-G formula based on SCr of 0.92 mg/dL). Liver Function Tests: Recent Labs  Lab 06/16/19 1103 06/17/19 0945 06/18/19 0539 06/19/19 0539 06/19/19 1323  AST 15 16 17  33 38  ALT 13 13 12 29  <5  ALKPHOS 72 69 60 69 81  BILITOT 0.6 0.7 1.0 1.0 1.0  PROT 7.7 7.2 6.6 8.0 8.2*  ALBUMIN 4.0 3.7 3.4* 4.1 4.2   Recent Labs  Lab 06/16/19 1103  LIPASE 27   No results for input(s): AMMONIA in the last 168 hours. Coagulation Profile: No results for input(s):  INR, PROTIME in the last 168 hours. Cardiac Enzymes: No results for input(s): CKTOTAL, CKMB, CKMBINDEX, TROPONINI in the last 168 hours. BNP (last 3 results) No results for input(s): PROBNP in the last 8760 hours. HbA1C: No results for input(s): HGBA1C in the last 72 hours. CBG: No results for input(s): GLUCAP in the last 168 hours. Lipid Profile: No results for input(s): CHOL, HDL, LDLCALC, TRIG, CHOLHDL, LDLDIRECT in the last 72 hours. Thyroid Function Tests: No results for input(s): TSH, T4TOTAL, FREET4, T3FREE, THYROIDAB in the last 72 hours. Anemia Panel: No results for input(s): VITAMINB12, FOLATE, FERRITIN, TIBC, IRON, RETICCTPCT in the last 72 hours. Sepsis Labs: Recent Labs  Lab 06/19/19 0911 06/19/19 1143  LATICACIDVEN 1.1 2.9*    Recent Results (from the past 240 hour(s))  SARS CORONAVIRUS 2 (TAT 6-24 HRS) Nasopharyngeal Nasopharyngeal Swab     Status: None   Collection Time: 06/16/19  2:26 PM   Specimen: Nasopharyngeal Swab  Result Value Ref Range Status   SARS Coronavirus 2 NEGATIVE NEGATIVE Final    Comment: (NOTE) SARS-CoV-2 target nucleic acids are NOT DETECTED. The SARS-CoV-2 RNA is generally detectable in upper and lower respiratory specimens during the acute phase of infection. Negative results do not preclude SARS-CoV-2 infection, do not rule out co-infections with other pathogens, and should not be used as the sole basis for treatment or other patient management decisions. Negative results must be combined with clinical observations, patient history, and epidemiological information. The expected result is Negative. Fact Sheet for Patients: 08/19/19 Fact Sheet for Healthcare Providers: 08/19/19 This test is not yet approved or cleared by the 08/16/19 FDA and  has been authorized for detection and/or diagnosis of SARS-CoV-2 by FDA under an Emergency Use Authorization (EUA). This EUA  will remain  in effect (meaning this test can be used) for the duration of the COVID-19 declaration under Section 56 4(b)(1) of the Act, 21 U.S.C. section 360bbb-3(b)(1), unless the authorization is terminated or revoked sooner. Performed at [facility], [address], [place], [facility] [address]      RN Pressure Injury Documentation:     Estimated body mass index is 30.41 kg/m as calculated from the following:   Height as of this encounter: 5\' 8"  (1.727 m).   Weight as of this encounter: 90.7 kg.  Malnutrition Type:      Malnutrition Characteristics:      Nutrition Interventions:     Radiology Studies: MR BRAIN WO CONTRAST  Result Date:  06/19/2019 CLINICAL DATA:  Dizziness EXAM: MRI HEAD WITHOUT CONTRAST TECHNIQUE: Multiplanar, multiecho pulse sequences of the brain and surrounding structures were obtained without intravenous contrast. COMPARISON:  None. FINDINGS: DWI, axial T2, and sagittal T1 sequences were obtained. Patient could not tolerate remainder of the study. Brain: There is no acute infarction. Ventricles and sulci are normal in size and configuration. There is no mass lesion or mass effect. No hydrocephalus. Vascular: Major vessel flow voids at the skull base are preserved within limitation of motion artifact. Skull and upper cervical spine: Normal marrow signal is preserved. Sinuses/Orbits: Patchy left posterior ethmoid mucosal thickening. Orbits are unremarkable. Other: Mastoid air cells are clear. IMPRESSION: Partial study demonstrate no acute infarction. Electronically Signed   By: Guadlupe Spanish M.D.   On: 06/19/2019 16:28   CT ABDOMEN PELVIS W CONTRAST  Result Date: 06/19/2019 CLINICAL DATA:  59 year old female with abdominal pain, nausea vomiting. EXAM: CT ABDOMEN AND PELVIS WITH CONTRAST TECHNIQUE: Multidetector CT imaging of the abdomen and pelvis was performed using the standard protocol following bolus administration of intravenous contrast.  CONTRAST:  OMNIPAQUE IOHEXOL 300 MG/ML  SOLN COMPARISON:  CT abdomen pelvis dated 10/03/2018. FINDINGS: Evaluation of this exam is limited due to respiratory motion artifact. Evaluation is also limited due to streak artifact caused by patient's arms. Lower chest: Minimal bibasilar atelectatic changes. The visualized lung bases are otherwise clear. No intra-abdominal free air or free fluid. Hepatobiliary: The liver is unremarkable. No intrahepatic biliary ductal dilatation. The gallbladder is unremarkable. Mild dilatation of the common bile duct measuring up to 13 mm similar to prior CT. No calcified stone noted in the central CBD. Pancreas: Unremarkable. No pancreatic ductal dilatation or surrounding inflammatory changes. Spleen: Normal in size without focal abnormality. Adrenals/Urinary Tract: The right adrenal gland is unremarkable. Mild thickening of the left adrenal gland similar to prior CT. There is no hydronephrosis on either side. There is symmetric enhancement and excretion of contrast by both kidneys. The visualized ureters and urinary bladder appear unremarkable. Stomach/Bowel: There are scattered colonic diverticula without active inflammatory changes. Mild diffuse thickened appearance of the colon, likely related to underdistention. Colitis is less likely. Clinical correlation is recommended. There is no bowel obstruction. The appendix is normal. Vascular/Lymphatic: Mild aortoiliac atherosclerotic disease. The IVC is unremarkable. No portal venous gas. There is no adenopathy. Reproductive: Hysterectomy. No pelvic mass. Other: Small fat containing umbilical hernia. Musculoskeletal: Degenerative changes of the spine. Lower lumbar posterior fusion. No acute osseous pathology. IMPRESSION: 1. Mild diffuse thickened appearance of the colon, likely related to underdistention. Colitis is less likely. Clinical correlation is recommended. No bowel obstruction. Normal appendix. 2. Colonic diverticulosis. 3.  Aortic Atherosclerosis (ICD10-I70.0). Electronically Signed   By: Elgie Collard M.D.   On: 06/19/2019 15:41   Scheduled Meds:  amitriptyline  10 mg Oral QHS   cetirizine  10 mg Oral Daily   enoxaparin (LOVENOX) injection  40 mg Subcutaneous Q24H   fluticasone  2 spray Each Nare Daily   nicotine  21 mg Transdermal Daily   pantoprazole (PROTONIX) IV  40 mg Intravenous Daily   polyethylene glycol  17 g Oral Daily   scopolamine  1 patch Transdermal Q72H   sodium chloride (PF)       sodium chloride flush  3 mL Intravenous Once   sucralfate  1 g Oral TID WC & HS   Continuous Infusions:  sodium bicarbonate 150 mEq in dextrose 5% 1000 mL 150 mEq (06/19/19 1734)    LOS: 2 days  Kerney Elbe, DO [facility] PAGER is on AMION  If 7PM-7AM, please contact night-coverage [email or web address]

## 2019-06-19 NOTE — Progress Notes (Signed)
[facility] Gastroenterology Progress Note  Kristen Ramos 59 y.o. Jul 12, 1960  CC: Nausea, vomiting, abdominal pain   Subjective: Patient notes continued nausea and vomiting.  She has diffuse abdominal discomfort and feels unwell.  Her dizziness has resolved with scope patch.  ROS : Review of Systems  Constitutional: Positive for malaise/fatigue. Negative for chills and fever.  Gastrointestinal: Positive for abdominal pain, nausea and vomiting. Negative for blood in stool, constipation, diarrhea, heartburn and melena.  Neurological: Negative for dizziness and loss of consciousness.   Objective: Vital signs in last 24 hours: Vitals:   06/18/19 2122 06/19/19 0443  BP: (!) 150/85 (!) 151/83  Pulse: 60 70  Resp: 16   Temp: 97.9 F (36.6 C) 98.2 F (36.8 C)  SpO2: 97%     Physical Exam: Physical Exam  Constitutional: She is oriented to person, place, and time. She appears well-developed and well-nourished. She appears lethargic. No distress.  Cardiovascular: Normal rate and regular rhythm.  Pulmonary/Chest: Effort normal and breath sounds normal.  Abdominal: Soft. Bowel sounds are normal. She exhibits no distension and no mass. There is no abdominal tenderness. There is no rebound and no guarding.  Musculoskeletal:        General: No deformity or edema.  Neurological: She is oriented to person, place, and time. She appears lethargic.  Skin: Skin is warm and dry.  Psychiatric: She has a normal mood and affect. Her behavior is normal.    Lab Results: Recent Labs    06/18/19 0539 06/19/19 0539  NA 141 139  K 3.5 3.7  CL 111 103  CO2 20* 18*  GLUCOSE 87 145*  BUN 9 6  CREATININE 0.75 0.69  CALCIUM 8.6* 9.3  MG 1.9 2.0  PHOS 3.4 2.5   Recent Labs    06/18/19 0539 06/19/19 0539  AST 17 33  ALT 12 29  ALKPHOS 60 69  BILITOT 1.0 1.0  PROT 6.6 8.0  ALBUMIN 3.4* 4.1   Recent Labs    06/18/19 0539 06/19/19 0539  WBC 5.8 5.6  NEUTROABS 2.9 4.3  HGB 12.4 13.8  HCT  37.6 41.5  MCV 89.3 89.6  PLT 259 252   No results for input(s): LABPROT, INR in the last 72 hours.    Assessment/ Cyclic nausea and vomiting.  May be related to chronic opioid use.    Head CT was within normal limits.  Patient had improvement in dizziness after application of scope patch.  There may be a component of vestibular dysfunction.  Plan: Due to persistent nausea, vomiting, abdominal discomfort, we will proceed with abdominal CT to rule out acute intra-abdominal pathologies.  Continue amitriptyline nightly.  Continue antiemetics as needed.  Continue Protonix and Carafate.  Discontinue Reglan when patient is discharged.  Recommend outpatient ENT follow-up after discharge to assess for vestibular dysfunction.  [facility] will follow.   Edrick Kins PA-C 06/19/2019, 11:51 AM  Contact #  [phone]

## 2019-06-19 NOTE — Progress Notes (Signed)
PT Cancellation Note  Patient Details Name: Kristen Ramos MRN: [id number] DOB: 15-Nov-1960   Cancelled Treatment:    Reason Eval/Treat Not Completed: Patient at procedure or test/unavailable(pt is at MRI. Will follow.)   Tamala Ser PT 06/19/2019  Acute Rehabilitation Services Pager [phone] Office [phone]

## 2019-06-19 NOTE — Progress Notes (Signed)
This patient is a pleasant lady who presented in ED with nausea and vomiting. Kristen Ramos today, around lunch, shows sign of confusion and her LOC deteriorated. Nurse notified attenting MD. MD ordered new labs to be drawn, bolus of fluid. She was also scheduled for MRI. At the moment she is calm and no complaints of pain. Nurse will continue to monitor her progress moving forward. Will notify Md if any change occur.

## 2019-06-19 NOTE — Care Management Important Message (Signed)
Important Message  Patient Details IM Letter given to Sandford Craze RN Case Manager to present to the Patient Name: Kristen Ramos MRN: [id number] Date of Birth: 1961/02/13   Medicare Important Message Given:  Yes     Caren Macadam 06/19/2019, 9:43 AM

## 2019-06-20 LAB — MAGNESIUM: Magnesium: 2.1 mg/dL (ref 1.7–2.4)

## 2019-06-20 LAB — COMPREHENSIVE METABOLIC PANEL
ALT: 39 U/L (ref 0–44)
AST: 34 U/L (ref 15–41)
Albumin: 3.9 g/dL (ref 3.5–5.0)
Alkaline Phosphatase: 75 U/L (ref 38–126)
Anion gap: 14 (ref 5–15)
BUN: 8 mg/dL (ref 6–20)
CO2: 21 mmol/L — ABNORMAL LOW (ref 22–32)
Calcium: 9.1 mg/dL (ref 8.9–10.3)
Chloride: 100 mmol/L (ref 98–111)
Creatinine, Ser: 0.54 mg/dL (ref 0.44–1.00)
GFR calc Af Amer: >60 mL/min (ref >60)
GFR calc non Af Amer: >60 mL/min (ref >60)
Glucose, Bld: 150 mg/dL — ABNORMAL HIGH (ref 70–99)
Potassium: 3.4 mmol/L — ABNORMAL LOW (ref 3.5–5.1)
Sodium: 135 mmol/L (ref 135–145)
Total Bilirubin: 1.1 mg/dL (ref 0.3–1.2)
Total Protein: 7.9 g/dL (ref 6.5–8.1)

## 2019-06-20 LAB — CBC WITH DIFFERENTIAL/PLATELET
Abs Immature Granulocytes: 0.03 10*3/uL (ref 0.00–0.07)
Basophils Absolute: 0 10*3/uL (ref 0.0–0.1)
Basophils Relative: 0 %
Eosinophils Absolute: 0 10*3/uL (ref 0.0–0.5)
Eosinophils Relative: 0 %
HCT: 45.1 % (ref 36.0–46.0)
Hemoglobin: 15.5 g/dL — ABNORMAL HIGH (ref 12.0–15.0)
Immature Granulocytes: 0 %
Lymphocytes Relative: 16 %
Lymphs Abs: 1.5 10*3/uL (ref 0.7–4.0)
MCH: 29.4 pg (ref 26.0–34.0)
MCHC: 34.4 g/dL (ref 30.0–36.0)
MCV: 85.4 fL (ref 80.0–100.0)
Monocytes Absolute: 0.7 10*3/uL (ref 0.1–1.0)
Monocytes Relative: 7 %
Neutro Abs: 7.2 10*3/uL (ref 1.7–7.7)
Neutrophils Relative %: 77 %
Platelets: 303 10*3/uL (ref 150–400)
RBC: 5.28 MIL/uL — ABNORMAL HIGH (ref 3.87–5.11)
RDW: 13.5 % (ref 11.5–15.5)
WBC: 9.5 10*3/uL (ref 4.0–10.5)
nRBC: 0 % (ref 0.0–0.2)

## 2019-06-20 LAB — HEMOGLOBIN A1C
Hgb A1c MFr Bld: 5.9 % — ABNORMAL HIGH (ref 4.8–5.6)
Mean Plasma Glucose: 123 mg/dL

## 2019-06-20 LAB — PHOSPHORUS: Phosphorus: 4.1 mg/dL (ref 2.5–4.6)

## 2019-06-20 MED ORDER — METOCLOPRAMIDE HCL 5 MG/ML IJ SOLN
10.0000 mg | Freq: Three times a day (TID) | INTRAMUSCULAR | Status: AC
  Administered 2019-06-20 – 2019-06-21 (×3): 10 mg via INTRAVENOUS
  Filled 2019-06-20 (×3): qty 2

## 2019-06-20 MED ORDER — LACTATED RINGERS IV SOLN: INTRAVENOUS | Status: DC

## 2019-06-20 MED ORDER — POTASSIUM CHLORIDE 10 MEQ/100ML IV SOLN
10.0000 meq | INTRAVENOUS | Status: AC
  Administered 2019-06-20 (×4): 10 meq via INTRAVENOUS
  Filled 2019-06-20 (×2): qty 100

## 2019-06-20 MED ORDER — SODIUM BICARBONATE 650 MG PO TABS
650.0000 mg | ORAL_TABLET | Freq: Three times a day (TID) | ORAL | Status: DC
  Administered 2019-06-20 – 2019-06-22 (×8): 650 mg via ORAL
  Filled 2019-06-20 (×8): qty 1

## 2019-06-20 MED ORDER — SUCRALFATE 1 GM/10ML PO SUSP
1.0000 g | Freq: Three times a day (TID) | ORAL | Status: DC
  Administered 2019-06-20 – 2019-06-22 (×6): 1 g via ORAL
  Filled 2019-06-20 (×7): qty 10

## 2019-06-20 MED ORDER — DEXTROSE-NACL 5-0.9 % IV SOLN: INTRAVENOUS | Status: DC

## 2019-06-20 NOTE — Progress Notes (Signed)
Subjective: Patient continues to feel nauseous,continues to have multiple episodes of small-volume emesis containing clear fluid or bile, has not had a bowel movement in 3 days, denies abdominal pain.  Objective: Vital signs in last 24 hours: Temp:  [97.8 F (36.6 C)-98.2 F (36.8 C)] 97.8 F (36.6 C) (04/10 0641) Pulse Rate:  [77] 77 (04/10 0641) Resp:  [17-18] 17 (04/10 0641) BP: (155-179)/(79-80) 155/79 (04/10 0641) SpO2:  [94 %] 94 % (04/10 0641) Weight change:  Last BM Date: 06/18/19  PE: Dry mucous membrane, appears ill GENERAL: Emesis bag at bedside ABDOMEN: Soft, nondistended, nontender, hyperactive bowel sounds EXTREMITIES: No deformity  Lab Results: Results for orders placed or performed during the hospital encounter of 06/16/19 (from the past 48 hour(s))  Hemoglobin A1c     Status: Abnormal   Collection Time: 06/19/19  5:39 AM  Result Value Ref Range   Hgb A1c MFr Bld 5.9 (H) 4.8 - 5.6 %    Comment: (NOTE)         Prediabetes: 5.7 - 6.4         Diabetes: >6.4         Glycemic control for adults with diabetes: <7.0    Mean Plasma Glucose 123 mg/dL    Comment: (NOTE) Performed At: [facility] [address] [place], [place] [id number] Jolene Schimke MD YK:[id number]   CBC with Differential/Platelet     Status: None   Collection Time: 06/19/19  5:39 AM  Result Value Ref Range   WBC 5.6 4.0 - 10.5 K/uL   RBC 4.63 3.87 - 5.11 MIL/uL   Hemoglobin 13.8 12.0 - 15.0 g/dL   HCT 39.7 67.3 - 41.9 %   MCV 89.6 80.0 - 100.0 fL   MCH 29.8 26.0 - 34.0 pg   MCHC 33.3 30.0 - 36.0 g/dL   RDW 37.9 02.4 - 09.7 %   Platelets 252 150 - 400 K/uL   nRBC 0.0 0.0 - 0.2 %   Neutrophils Relative % 75 %   Neutro Abs 4.3 1.7 - 7.7 K/uL   Lymphocytes Relative 18 %   Lymphs Abs 1.0 0.7 - 4.0 K/uL   Monocytes Relative 4 %   Monocytes Absolute 0.2 0.1 - 1.0 K/uL   Eosinophils Relative 1 %   Eosinophils Absolute 0.0 0.0 - 0.5 K/uL   Basophils Relative 1 %   Basophils  Absolute 0.1 0.0 - 0.1 K/uL   Immature Granulocytes 1 %   Abs Immature Granulocytes 0.03 0.00 - 0.07 K/uL    Comment: Performed at [facility], [address] [place], [place] [address]  Comprehensive metabolic panel     Status: Abnormal   Collection Time: 06/19/19  5:39 AM  Result Value Ref Range   Sodium 139 135 - 145 mmol/L   Potassium 3.7 3.5 - 5.1 mmol/L   Chloride 103 98 - 111 mmol/L   CO2 18 (L) 22 - 32 mmol/L   Glucose, Bld 145 (H) 70 - 99 mg/dL    Comment: Glucose reference range applies only to samples taken after fasting for at least 8 hours.   BUN 6 6 - 20 mg/dL   Creatinine, Ser 9.24 0.44 - 1.00 mg/dL   Calcium 9.3 8.9 - 26.8 mg/dL   Total Protein 8.0 6.5 - 8.1 g/dL   Albumin 4.1 3.5 - 5.0 g/dL   AST 33 15 - 41 U/L   ALT 29 0 - 44 U/L   Alkaline Phosphatase 69 38 - 126 U/L  Total Bilirubin 1.0 0.3 - 1.2 mg/dL   GFR calc non Af Amer >60 >60 mL/min   GFR calc Af Amer >60 >60 mL/min   Anion gap 18 (H) 5 - 15    Comment: Performed at [facility], [place] [address] [place], [place] [address]  Phosphorus     Status: None   Collection Time: 06/19/19  5:39 AM  Result Value Ref Range   Phosphorus 2.5 2.5 - 4.6 mg/dL    Comment: Performed at [facility], [place] [address], [place], [place] [address]  Magnesium     Status: None   Collection Time: 06/19/19  5:39 AM  Result Value Ref Range   Magnesium 2.0 1.7 - 2.4 mg/dL    Comment: Performed at [facility], [place] [address], [place], [place] [address]  Lactic acid, plasma     Status: None   Collection Time: 06/19/19  9:11 AM  Result Value Ref Range   Lactic Acid, Venous 1.1 0.5 - 1.9 mmol/L    Comment: Performed at [facility], [place] [address] [place], [place] [address]  Lactic acid, plasma     Status: Abnormal   Collection Time: 06/19/19 11:43 AM  Result Value Ref Range   Lactic Acid, Venous 2.9 (HH) 0.5 - 1.9 mmol/L     Comment: CRITICAL RESULT CALLED TO, READ BACK BY AND VERIFIED WITH: NELSON PINDA AT 1217 ON 06/19/2019 BY MOSLEY,J Performed at [facility], [place] [address], [place], [place] [address]   Comprehensive metabolic panel     Status: Abnormal   Collection Time: 06/19/19  1:23 PM  Result Value Ref Range   Sodium 136 135 - 145 mmol/L   Potassium 3.3 (L) 3.5 - 5.1 mmol/L   Chloride 102 98 - 111 mmol/L   CO2 16 (L) 22 - 32 mmol/L   Glucose, Bld 182 (H) 70 - 99 mg/dL    Comment: Glucose reference range applies only to samples taken after fasting for at least 8 hours.   BUN 7 6 - 20 mg/dL   Creatinine, Ser 0.92 0.44 - 1.00 mg/dL   Calcium 9.0 8.9 - 10.3 mg/dL   Total Protein 8.2 (H) 6.5 - 8.1 g/dL   Albumin 4.2 3.5 - 5.0 g/dL   AST 38 15 - 41 U/L   ALT <5 0 - 44 U/L    Comment: REPEATED TO VERIFY   Alkaline Phosphatase 81 38 - 126 U/L   Total Bilirubin 1.0 0.3 - 1.2 mg/dL   GFR calc non Af Amer >60 >60 mL/min   GFR calc Af Amer >60 >60 mL/min   Anion gap 18 (H) 5 - 15    Comment: Performed at [facility], [place] [address], [place], [place] [address]  Magnesium     Status: None   Collection Time: 06/19/19  1:23 PM  Result Value Ref Range   Magnesium 2.0 1.7 - 2.4 mg/dL    Comment: Performed at [facility], [place] [address] [place], [place] [address]  Phosphorus     Status: Abnormal   Collection Time: 06/19/19  1:23 PM  Result Value Ref Range   Phosphorus 2.3 (L) 2.5 - 4.6 mg/dL    Comment: Performed at [facility], [place] [address] [place],  [address]  Blood gas, arterial     Status: Abnormal   Collection Time: 06/19/19  2:20 PM  Result Value Ref Range   FIO2 21.00  pH, Arterial 7.454 (H) 7.350 - 7.450   pCO2 arterial 30.4 (L) 32.0 - 48.0 mmHg   pO2, Arterial 78.6 (L) 83.0 - 108.0 mmHg   Bicarbonate 21.0 20.0 - 28.0 mmol/L   Acid-base deficit 1.6 0.0 - 2.0 mmol/L   O2 Saturation 94.4 %   Patient  temperature 98.6    Allens test (pass/fail) PASS PASS    Comment: Performed at [place], [address], [place], [place] [address]  CBC with Differential/Platelet     Status: Abnormal   Collection Time: 06/20/19  5:54 AM  Result Value Ref Range   WBC 9.5 4.0 - 10.5 K/uL   RBC 5.28 (H) 3.87 - 5.11 MIL/uL   Hemoglobin 15.5 (H) 12.0 - 15.0 g/dL   HCT 60.4 54.0 - 98.1 %   MCV 85.4 80.0 - 100.0 fL   MCH 29.4 26.0 - 34.0 pg   MCHC 34.4 30.0 - 36.0 g/dL   RDW 19.1 47.8 - 29.5 %   Platelets 303 150 - 400 K/uL   nRBC 0.0 0.0 - 0.2 %   Neutrophils Relative % 77 %   Neutro Abs 7.2 1.7 - 7.7 K/uL   Lymphocytes Relative 16 %   Lymphs Abs 1.5 0.7 - 4.0 K/uL   Monocytes Relative 7 %   Monocytes Absolute 0.7 0.1 - 1.0 K/uL   Eosinophils Relative 0 %   Eosinophils Absolute 0.0 0.0 - 0.5 K/uL   Basophils Relative 0 %   Basophils Absolute 0.0 0.0 - 0.1 K/uL   Immature Granulocytes 0 %   Abs Immature Granulocytes 0.03 0.00 - 0.07 K/uL    Comment: Performed at [facility], [address], [place], [place] [address]  Comprehensive metabolic panel     Status: Abnormal   Collection Time: 06/20/19  5:54 AM  Result Value Ref Range   Sodium 135 135 - 145 mmol/L   Potassium 3.4 (L) 3.5 - 5.1 mmol/L   Chloride 100 98 - 111 mmol/L   CO2 21 (L) 22 - 32 mmol/L   Glucose, Bld 150 (H) 70 - 99 mg/dL    Comment: Glucose reference range applies only to samples taken after fasting for at least 8 hours.   BUN 8 6 - 20 mg/dL   Creatinine, Ser 8.65 0.44 - 1.00 mg/dL   Calcium 9.1 8.9 - 78.4 mg/dL   Total Protein 7.9 6.5 - 8.1 g/dL   Albumin 3.9 3.5 - 5.0 g/dL   AST 34 15 - 41 U/L   ALT 39 0 - 44 U/L   Alkaline Phosphatase 75 38 - 126 U/L   Total Bilirubin 1.1 0.3 - 1.2 mg/dL   GFR calc non Af Amer >60 >60 mL/min   GFR calc Af Amer >60 >60 mL/min   Anion gap 14 5 - 15    Comment: Performed at [facility], [address] [place], [place]  [address]  Magnesium     Status: None   Collection Time: 06/20/19  5:54 AM  Result Value Ref Range   Magnesium 2.1 1.7 - 2.4 mg/dL    Comment: Performed at [facility], [address] [place], [place] [address]  Phosphorus     Status: None   Collection Time: 06/20/19  5:54 AM  Result Value Ref Range   Phosphorus 4.1 2.5 - 4.6 mg/dL    Comment: Performed at [facility], [address], [place], [place] [address]    Studies/Results: MR BRAIN  WO CONTRAST  Result Date: 06/19/2019 CLINICAL DATA:  Dizziness EXAM: MRI HEAD WITHOUT CONTRAST TECHNIQUE: Multiplanar, multiecho pulse sequences of the brain and surrounding structures were obtained without intravenous contrast. COMPARISON:  None. FINDINGS: DWI, axial T2, and sagittal T1 sequences were obtained. Patient could not tolerate remainder of the study. Brain: There is no acute infarction. Ventricles and sulci are normal in size and configuration. There is no mass lesion or mass effect. No hydrocephalus. Vascular: Major vessel flow voids at the skull base are preserved within limitation of motion artifact. Skull and upper cervical spine: Normal marrow signal is preserved. Sinuses/Orbits: Patchy left posterior ethmoid mucosal thickening. Orbits are unremarkable. Other: Mastoid air cells are clear. IMPRESSION: Partial study demonstrate no acute infarction. Electronically Signed   By: Guadlupe Spanish M.D.   On: 06/19/2019 16:28   CT ABDOMEN PELVIS W CONTRAST  Result Date: 06/19/2019 CLINICAL DATA:  59 year old female with abdominal pain, nausea vomiting. EXAM: CT ABDOMEN AND PELVIS WITH CONTRAST TECHNIQUE: Multidetector CT imaging of the abdomen and pelvis was performed using the standard protocol following bolus administration of intravenous contrast. CONTRAST:  OMNIPAQUE IOHEXOL 300 MG/ML  SOLN COMPARISON:  CT abdomen pelvis dated 10/03/2018. FINDINGS: Evaluation of this exam is limited due to respiratory motion  artifact. Evaluation is also limited due to streak artifact caused by patient's arms. Lower chest: Minimal bibasilar atelectatic changes. The visualized lung bases are otherwise clear. No intra-abdominal free air or free fluid. Hepatobiliary: The liver is unremarkable. No intrahepatic biliary ductal dilatation. The gallbladder is unremarkable. Mild dilatation of the common bile duct measuring up to 13 mm similar to prior CT. No calcified stone noted in the central CBD. Pancreas: Unremarkable. No pancreatic ductal dilatation or surrounding inflammatory changes. Spleen: Normal in size without focal abnormality. Adrenals/Urinary Tract: The right adrenal gland is unremarkable. Mild thickening of the left adrenal gland similar to prior CT. There is no hydronephrosis on either side. There is symmetric enhancement and excretion of contrast by both kidneys. The visualized ureters and urinary bladder appear unremarkable. Stomach/Bowel: There are scattered colonic diverticula without active inflammatory changes. Mild diffuse thickened appearance of the colon, likely related to underdistention. Colitis is less likely. Clinical correlation is recommended. There is no bowel obstruction. The appendix is normal. Vascular/Lymphatic: Mild aortoiliac atherosclerotic disease. The IVC is unremarkable. No portal venous gas. There is no adenopathy. Reproductive: Hysterectomy. No pelvic mass. Other: Small fat containing umbilical hernia. Musculoskeletal: Degenerative changes of the spine. Lower lumbar posterior fusion. No acute osseous pathology. IMPRESSION: 1. Mild diffuse thickened appearance of the colon, likely related to underdistention. Colitis is less likely. Clinical correlation is recommended. No bowel obstruction. Normal appendix. 2. Colonic diverticulosis. 3. Aortic Atherosclerosis (ICD10-I70.0). Electronically Signed   By: Elgie Collard M.D.   On: 06/19/2019 15:41    Medications: I have reviewed the patient's current  medications.  Assessment: Intractable nausea and vomiting Appears cyclical, possibly related to opioid use No abnormality noted on CAT scan, except mild diffuse thickened appearance of colon likely related to underdistention and colonic diverticulosis Unremarkable MRI brain Prior EGD from 04/2018 performed for nausea and vomiting was unremarkable including biopsies for H. pylori and celiac disease Patent cystic and common bile duct on HIDA from 06/17/2019 Gastric emptying scan from 07/21/2018 showed rapid emptying of the stomach not compatible with gastroparesis  Mild hypokalemia-IV replacement ordered, elevated hemoglobin 15.5-possibly related to dehydration Improvement in acidosis  Plan: Ringer's lactate at 125 mL/h to avoid dehydration. Reglan 10 mg IV every 8 hours  x3 doses. Continue meclizine 25 mg 3 times daily as needed as needed, Zofran 4 mg IV every 6 hours as needed as needed, pantoprazole 40 mg IV daily, scopolamine transdermal patch every 72 hours. We will continue to follow.  Kerin Salen, MD 06/20/2019, 9:54 AM

## 2019-06-20 NOTE — Evaluation (Signed)
Physical Therapy Evaluation Patient Details Name: Kristen Ramos MRN: [id number] DOB: [birth year] Today's Date: 06/20/2019   History of Present Illness  59 y.o. female with medical history significant of rheumatoid arthritis, chronic back pain, cyclical vomiting who is coming to the emergency department due to multiple episodes of nausea and emesis.  Clinical Impression  Pt admitted with above diagnosis. Min A for bed to recliner transfer with RW, activity tolerance limited by fatigue. Pt currently with functional limitations due to the deficits listed below (see PT Problem List). Pt will benefit from skilled PT to increase their independence and safety with mobility to allow discharge to the venue listed below.       Follow Up Recommendations Home health PT    Equipment Recommendations  Rolling walker with 5" wheels;3in1 (PT)    Recommendations for Other Services       Precautions / Restrictions Precautions Precautions: None Precaution Comments: denies falls Restrictions Weight Bearing Restrictions: No      Mobility  Bed Mobility Overal bed mobility: Modified Independent             General bed mobility comments: HOB up, used rail  Transfers Overall transfer level: Needs assistance Equipment used: Rolling walker (2 wheeled) Transfers: Sit to/from [employer] Sit to Stand: Min assist Stand pivot transfers: Min guard       General transfer comment: min A to power up, VCs to keep hands on RW during SPT  Ambulation/Gait                Stairs            Wheelchair Mobility    Modified Rankin (Stroke Patients Only)       Balance Overall balance assessment: Modified Independent                                           Pertinent Vitals/Pain Pain Assessment: No/denies pain    Home Living Family/patient expects to be discharged to:: Private residence Living Arrangements: Children Available Help at Discharge:  Family;Available 24 hours/day   Home Access: Stairs to enter Entrance Stairs-Rails: Right Entrance Stairs-Number of Steps: 8 Home Layout: One level Home Equipment: None Additional Comments: lives with son and 47 y.o. granddaughter    Prior Function Level of Independence: Independent               Higher education careers adviser        Extremity/Trunk Assessment   Upper Extremity Assessment Upper Extremity Assessment: Overall WFL for tasks assessed    Lower Extremity Assessment Lower Extremity Assessment: Overall WFL for tasks assessed    Cervical / Trunk Assessment Cervical / Trunk Assessment: Normal  Communication   Communication: No difficulties  Cognition Arousal/Alertness: Awake/alert Behavior During Therapy: WFL for tasks assessed/performed Overall Cognitive Status: Within Functional Limits for tasks assessed                                        General Comments      Exercises     Assessment/Plan    PT Assessment Patient needs continued PT services  PT Problem List Decreased mobility;Decreased activity tolerance       PT Treatment Interventions      PT Goals (Current goals can be found in the Care Plan section)  Acute Rehab PT Goals Patient Stated Goal: likes to cook PT Goal Formulation: With patient Time For Goal Achievement: 07/04/19 Potential to Achieve Goals: Good    Frequency Min 3X/week   Barriers to discharge        Co-evaluation               AM-PAC PT "6 Clicks" Mobility  Outcome Measure Help needed turning from your back to your side while in a flat bed without using bedrails?: None Help needed moving from lying on your back to sitting on the side of a flat bed without using bedrails?: A Little Help needed moving to and from a bed to a chair (including a wheelchair)?: A Little Help needed standing up from a chair using your arms (e.g., wheelchair or bedside chair)?: A Little Help needed to walk in hospital room?: A  Little Help needed climbing 3-5 steps with a railing? : A Lot 6 Click Score: 18    End of Session Equipment Utilized During Treatment: Gait belt Activity Tolerance: Patient limited by fatigue Patient left: in chair;with call bell/phone within reach;with chair alarm set Nurse Communication: Mobility status PT Visit Diagnosis: Difficulty in walking, not elsewhere classified (R26.2)    Time: 8280-0349 PT Time Calculation (min) (ACUTE ONLY): 15 min   Charges:   PT Evaluation $PT Eval Low Complexity: 1 Low         Philomena Doheny PT 06/20/2019  Acute Rehabilitation Services Pager [phone] Office [phone]

## 2019-06-20 NOTE — Progress Notes (Signed)
PROGRESS NOTE    Kristen Ramos  BJY:[id number] DOB: June 26, 1960 DOA: 06/16/2019 PCP: Casimer Lanius, MD   Brief Narrative:  HPI per Dr. Sanda Klein on 06/16/2019  Kristen Ramos is a 59 y.o. female with medical history significant of rheumatoid arthritis, chronic back pain, cyclical vomiting who is coming to the emergency department due to multiple episodes of nausea and emesis since yesterday.  The symptoms are typical of her cyclical vomiting flares.  She denies fever, chills, night sweats, hematemesis, diarrhea, melena or hematochezia.  She denies dyspnea, chest pain, palpitations, dizziness, diaphoresis, PND, orthopnea or pitting edema of the lower extremities.  No dysuria, frequency or materia.  Denies polyuria, polydipsia, polyphagia or blurred vision.  ED Course: Initial vital signs temperature 97.5 F, pulse 71, respiration of 13, blood pressure 130/85 mmHg and O2 sat 93% on room air.  The patient received a 1000 mL NS bolus, 8 mg of morphine IVP, 10 mg of metoclopramide IVP and 0.5 mg lorazepam IV.  CBC was normal.  Lipase was normal.  CMP shows a glucose of 129 mg/dL, all other values are within normal range.  Three-way abdomen does not show any bowel dilatation, there is occasional air-fluid levels noted.  There is questionable enteritis or ileus.  Bowel obstruction was felt to be less likely.  Please see images and full radiology report for further detail.  **Interim History  She continued to have intractable nausea or vomiting spite being on scheduled Reglan and as needed Zofran.  Because of this gastroenterology was consulted for further evaluation and recommendations and they are recommending a HIDA scan and head CT scan without contrast to rule out mass lesions or other serious causes of dizziness concurrent nausea and vomiting.    06/18/19 She continues to endorse diffuse abdominal pain but gastroenterology feels this may be secondary to chronic opioid usage.  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  GI recommended continuing as needed antiemetics and continue Protonix need added Carafate.  GI was in agreement with the scopolamine patch the patient continues to be extremely upset at the reason why she continues to have nausea vomiting and believes it secondary to her hiatal hernia however her hiatal hernia was very small per my discussion with Dr. Levora Angel.  06/19/19 She continues to be nauseous with abdominal pain and vomiting so GI is ordered her a CT abdomen and pelvis.  She acutely became confused this afternoon per nursing and had a metabolic acidosis so she was started on sodium bicarbonate drip and an MRI was done.  Because of her confusion she also had an ABG.  MRI was unrevealing.  Patient did have some dizziness when she turns her head to the side with question for BPPV.  06/20/19 Gastroenterology has now readded metoclopramide.  She continues to be nauseous vomit and have abdominal pain but states her dizziness is improved.  Assessment & Plan:   Principal Problem:   Intractable cyclical vomiting Active Problems:   Rheumatoid arteritis (HCC)  Intractable Cyclical Nausea and Vomiting Associated with abdominal pain; continues vomiting and dry heaving occasionally -Admit to Med/Surge . -Initially kept n.p.o. but now GI recommending a soft diet; continues to be nauseous and having abdominal pain and vomiting -Changed IV fluids to sodium bicarbonate given her metabolic acidosis but since gastroenterology is changing her to lactated Ringer's at 125 mL/h to avoid dehydration we will stop  the sodium bicarb and provide her sodium bicarbonate tablets -D/G Abdomen showed "No bowel dilatation. Occasional air-fluid levels noted. Question a degree of enteritis or ileus. Bowel obstruction felt to be less likely. No free air. Lungs clear."  -C/w Analgesics as needed with Judicious use of Narcotics -C/w Antiemetics with Ondansetron 4 mg po/IV q6hprn Nausea and  -Was on metoclopramide 10 mg IV q6h however now the metoclopramide has been stopped gastroenterology has now reinitiated this at 10 mg IV every 8 hours scheduled for 3 doses -Because had no improvement Gastroenterology was consulted for further evaluation recommendations and recommending a HIDA scan today as well as a head CT scan to rule out mass lesions or other serious causes with dizziness with concurrent nausea or vomiting -HIDA showed that the cyst in the common bile duct were patent however no EF was done as patient was unable to drink the Ensure portion of the to calculate the ejection fraction -Head CT done and showed no acute intracranial hemorrhage, mass-effect or evidence of acute infarction -Gastroenterology recommend continue supportive care as well as antiemetics and they were in agreement with the Scopolamine patch that I have ordered -They also recommend a trial of MiraLAX to see if constipation is contributing to her symptoms and recommending decreasing/limiting narcotic usage -Continue with IV PPI with Pantoprazole 40 mg in the a.m. added Carafate -We will monitor patient's clinical response to intervention and follow up on gastroenterology recommendations -Check UDS and U/A and still pending essentially unremarkable except that she had positive opiates in her UDS -Gastroenterology also checked CT abdomen pelvis with contrast and showed "Mild diffuse thickened appearance of the colon, likely related to underdistention. Colitis is less likely. Clinical correlation is recommended. No bowel obstruction. Normal appendix. Colonic diverticulosis. Aortic Atherosclerosis." -Continue to monitor carefully and work-up for her vestibular issues as below and have ordered an MRI today which was negative -I have asked PT to do vestibular testing with patient and she was started  on meclizine and will continue; PT recommending home health currently -Gastroenterology recommending continue meclizine 25 mg p.o. 3 times daily as needed, Zofran 4 mg IV 6 hours as needed, pantoprazole 40 m IV daily, scopolamine transdermal patch every 72 hours for her nausea and vomiting -C/w Amitriptyline started by GI -Patient is also on Carafate but will change to suspension at 1 g 3 times daily with meals and at bedtime -Appreciate further care per Gastroenterology  High Anion Gap Metabolic Acidosis Lactic Acidosis -Clean the setting of nausea and vomiting -Patient's CO2 was 16, anion gap was 18, chloride level was 102; her CO2 was 21, anion gap is 14, and chloride levels 100 -Fluids changed to sodium bicarbonate at 75 MLS per hour yesterday and have now been stopped given that she has been started on lactated Ringer's at 125 mL's per hour by gastroenterology; normal saline 500 mL bolus given yesterday -Continue with p.o. sodium bicarbonate tablets 650 mg p.o. 3 times daily -Continue to Monitor and Trend -Repeat CMP in AM   Rheumatoid Arteritis (HCC) -Hold Methotrexate. -Judicious use of opiate pain medications -Continue acetaminophen  Obesity -Estimated body mass index is 30.41 kg/m as calculated from the following:   Height as of this encounter: 5\' 8"  (1.727 m).   Weight as of this encounter: 90.7 kg. -Weight Loss and Dietary Counseling given   Hyperglycemia in a patient with a history of prediabetes -Check HbA1c in the AM as last hemoglobin A1c was 5.9 almost a year ago -Blood sugars have been  ranging from 87-182 on daily CMP's'; this a.m. it was 150 on the CMP -If necessary will place on sensitive NovoLog/scale insulin  Tobacco Abuse -Smoking cessation counseling given -Continue nicotine 21 mg transdermally every 24 hours daily  Dizziness, improving -Head CT being obtained by Gastroenterology was negative and showed no acute intracranial hemorrhage, mass effect, or  evidence of acute infarction -Will try Scopolamine Patch patient still continues to be dizzy with movement to the right -Will also obtain a PT Vestibular Consult and they are recommending home health PT -MRI done and as below -Try Meclizine we will continue 25 mg p.o. 3 times daily as needed for dizziness nausea -She was started on sodium bicarbonate drip yesterday but this did not been stopped as gastroenterology has placed her on lactated Ringer's at 125 mL's per hour -She may have something vestibular going on and will test this to see if this is contributing to her dizziness which is then in turn causing her nausea and vomiting  Acute encephalopathy and confusion, improving  -Unclear etiology but ordered an ABG which showed metabolic alkalosis -MRI done and showed "Partial study demonstrate no acute infarction. -Continue with Delirium precautions -Obtain blood cultures x2 and showed NGTD <24 Hours -ABG done and showed pH of 7.454, PCO2 of 30.4, PO2 of 78.6, ABG O2 saturation of 94.4% with a bicarbonate level of 21.0 and FiO2 of 21.0 -Continue to monitor and her confusion is improved but she is more withdrawn and continues to have some abdominal pain, nausea and vomiting  Hypophosphatemia -Patient's Phos level was 2.3 and today is 4.1 -Continue monitor and replete as necessary -Repeat phosphorus level in a.m.  Hypokalemia -Patient's potassium this morning was 3.4 -Replete with IV KCl 40 mEq -Continue to monitor and replete as necessary -Repeat CMP in a.m.   Erythrocytosis -Patient is a smoker and likely has some component of dehydration -Gastroenterology has restarted her IV fluids at 125 an hour of lactated Ringer's -Continue monitor and trend and repeat CBC in a.m.  DVT prophylaxis: Enoxaparin 40 mg sq q24h Code Status: FULL CODE  Family Communication: No family present at bedside  Disposition Plan: Pending further gastroenterology evaluation and diet tolerance.  Patient is  from home and likely will be discharged back home when she is able to tolerate a diet and is no longer nauseous and vomiting.  She has several studies for further work-up that are needing to be done and she is being continued on antiemetics  Consultants:   Gastroenterology    Procedures:  HIDA Scan  CT Abd/Pelvis MRI   Antimicrobials:  Anti-infectives (From admission, onward)   None     Subjective: Seen and examined at bedside and she was resting but still complained of some nausea vomiting abdominal pain.  States her dizziness is improved.  Denies any chest pain.  No other concerns or complaints at this time and Gastroenterology is closely following.  Objective: Vitals:   06/19/19 0443 06/19/19 2229 06/20/19 0641 06/20/19 1404  BP: (!) 151/83 (!) 179/80 (!) 155/79 (!) 163/107  Pulse: 70 77 77 95  Resp:  Temp: 98.2 F (36.8 C) 98.2 F (36.8 C) 97.8 F (36.6 C) 97.8 F (36.6 C)  TempSrc: Oral Oral Oral Oral  SpO2:  94% 94% 100%  Weight:      Height:       No intake or output data in the 24 hours ending 06/20/19 1620 Filed Weights   06/17/19 0500  Weight: 90.7 kg  Examination: Physical Exam:  Constitutional: WN/WD obese African-American female who still appears uncomfortable and slightly nauseous  Eyes: Lids and conjunctivae normal, sclerae anicteric  ENMT: External Ears, Nose appear normal. Grossly normal hearing.  Neck: Appears normal, supple, no cervical masses, normal ROM, no appreciable thyromegaly; no JVD Respiratory: Diminished to auscultation bilaterally, no wheezing, rales, rhonchi or crackles. Normal respiratory effort and patient is not tachypenic. No accessory muscle use.  She has unlabored breathing Cardiovascular: RRR, no murmurs / rubs / gallops. S1 and S2 auscultated. No extremity edema. Abdomen: Soft, tender to palpate, distended secondary body habitus. Bowel sounds positive x4.  GU: Deferred. Musculoskeletal: No clubbing / cyanosis of  digits/nails. No joint deformity upper and lower extremities.  Skin: No rashes, lesions, ulcers on a limited skin evaluation. No induration; Warm and dry.  Neurologic: CN 2-12 grossly intact with no focal deficits. Romberg sign and cerebellar reflexes not assessed.  Psychiatric: Normal judgment and insight. Alert and oriented x 3.  She is slightly depressed appearing and flat affect does appear somewhat uncomfortable.  Data Reviewed: I have personally reviewed following labs and imaging studies  CBC: Recent Labs  Lab 06/16/19 1103 06/17/19 0945 06/18/19 0539 06/19/19 0539 06/20/19 0554  WBC 5.2 5.5 5.8 5.6 9.5  NEUTROABS  --  3.6 2.9 4.3 7.2  HGB 13.4 13.0 12.4 13.8 15.5*  HCT 40.5 38.7 37.6 41.5 45.1  MCV 88.8 89.8 89.3 89.6 85.4  PLT 297 265 259 252 303   Basic Metabolic Panel: Recent Labs  Lab 06/17/19 0945 06/18/19 0539 06/19/19 0539 06/19/19 1323 06/20/19 0554  NA 143 141 139 136 135  K 3.8 3.5 3.7 3.3* 3.4*  CL 112* 111 103 102 100  CO2 22 20* 18* 16* 21*  GLUCOSE 106* 87 145* 182* 150*  BUN 10 9 6 7 8   CREATININE 0.78 0.75 0.69 0.92 0.54  CALCIUM 8.8* 8.6* 9.3 9.0 9.1  MG 2.1 1.9 2.0 2.0 2.1  PHOS 3.1 3.4 2.5 2.3* 4.1   GFR: Estimated Creatinine Clearance: 91.4 mL/min (by C-G formula based on SCr of 0.54 mg/dL). Liver Function Tests: Recent Labs  Lab 06/17/19 0945 06/18/19 0539 06/19/19 0539 06/19/19 1323 06/20/19 0554  AST 16 17 33 38 34  ALT 13 12 29  <5 39  ALKPHOS 69 60 69 81 75  BILITOT 0.7 1.0 1.0 1.0 1.1  PROT 7.2 6.6 8.0 8.2* 7.9  ALBUMIN 3.7 3.4* 4.1 4.2 3.9   Recent Labs  Lab 06/16/19 1103  LIPASE 27   No results for input(s): AMMONIA in the last 168 hours. Coagulation Profile: No results for input(s): INR, PROTIME in the last 168 hours. Cardiac Enzymes: No results for input(s): CKTOTAL, CKMB, CKMBINDEX, TROPONINI in the last 168 hours. BNP (last 3 results) No results for input(s): PROBNP in the last 8760 hours. HbA1C: Recent Labs     06/19/19 0539  HGBA1C 5.9*   CBG: No results for input(s): GLUCAP in the last 168 hours. Lipid Profile: No results for input(s): CHOL, HDL, LDLCALC, TRIG, CHOLHDL, LDLDIRECT in the last 72 hours. Thyroid Function Tests: No results for input(s): TSH, T4TOTAL, FREET4, T3FREE, THYROIDAB in the last 72 hours. Anemia Panel: No results for input(s): VITAMINB12, FOLATE, FERRITIN, TIBC, IRON, RETICCTPCT in the last 72 hours. Sepsis Labs: Recent Labs  Lab 06/19/19 0911 06/19/19 1143  LATICACIDVEN 1.1 2.9*    Recent Results (from the past 240 hour(s))  SARS CORONAVIRUS 2 (TAT 6-24 HRS) Nasopharyngeal Nasopharyngeal Swab     Status: None  Collection Time: 06/16/19  2:26 PM   Specimen: Nasopharyngeal Swab  Result Value Ref Range Status   SARS Coronavirus 2 NEGATIVE NEGATIVE Final    Comment: (NOTE) SARS-CoV-2 target nucleic acids are NOT DETECTED. The SARS-CoV-2 RNA is generally detectable in upper and lower respiratory specimens during the acute phase of infection. Negative results do not preclude SARS-CoV-2 infection, do not rule out co-infections with other pathogens, and should not be used as the sole basis for treatment or other patient management decisions. Negative results must be combined with clinical observations, patient history, and epidemiological information. The expected result is Negative. Fact Sheet for Patients: SugarRoll.be Fact Sheet for Healthcare Providers: https://www.woods-mathews.com/ This test is not yet approved or cleared by the [place] FDA and  has been authorized for detection and/or diagnosis of SARS-CoV-2 by FDA under an Emergency Use Authorization (EUA). This EUA will remain  in effect (meaning this test can be used) for the duration of the COVID-19 declaration under Section 56 4(b)(1) of the Act, 21 U.S.C. section 360bbb-3(b)(1), unless the authorization is terminated or revoked sooner. Performed  at [facility], [place] [address], [place], [place] [address]   Culture, blood (routine x 2)     Status: None (Preliminary result)   Collection Time: 06/19/19  1:36 PM   Specimen: BLOOD  Result Value Ref Range Status   Specimen Description   Final    BLOOD LEFT ARM Performed at [facility] [address], [place], [place] [address]    Special Requests   Final    BOTTLES DRAWN AEROBIC AND ANAEROBIC Blood Culture adequate volume Performed at [facility] [address], [place], [place] [address]    Culture   Final    NO GROWTH < 24 HOURS Performed at [facility] [address], [place], [place] [address]    Report Status PENDING  Incomplete  Culture, blood (routine x 2)     Status: None (Preliminary result)   Collection Time: 06/19/19  1:42 PM   Specimen: BLOOD LEFT HAND  Result Value Ref Range Status   Specimen Description   Final    BLOOD LEFT HAND Performed at [facility] [address], [place],  [address]    Special Requests   Final    BOTTLES DRAWN AEROBIC ONLY Blood Culture adequate volume Performed at [facility] [address], [place],  [address]    Culture   Final    NO GROWTH < 24 HOURS Performed at [facility] [address], [place],  [address]    Report Status PENDING  Incomplete     RN Pressure Injury Documentation:     Estimated body mass index is 30.41 kg/m as calculated from the following:   Height as of this encounter: 5\' 8"  (1.727 m).   Weight as of this encounter: 90.7 kg.  Malnutrition Type:      Malnutrition Characteristics:      Nutrition Interventions:     Radiology Studies: MR BRAIN WO CONTRAST  Result Date: 06/19/2019 CLINICAL DATA:  Dizziness EXAM: MRI HEAD WITHOUT CONTRAST TECHNIQUE: Multiplanar, multiecho pulse sequences of the brain and surrounding structures were obtained without intravenous contrast.  COMPARISON:  None. FINDINGS: DWI, axial T2, and sagittal T1 sequences were obtained. Patient could not tolerate remainder of the study. Brain: There is no acute infarction. Ventricles and sulci are normal in size and configuration. There is no mass lesion or mass  effect. No hydrocephalus. Vascular: Major vessel flow voids at the skull base are preserved within limitation of motion artifact. Skull and upper cervical spine: Normal marrow signal is preserved. Sinuses/Orbits: Patchy left posterior ethmoid mucosal thickening. Orbits are unremarkable. Other: Mastoid air cells are clear. IMPRESSION: Partial study demonstrate no acute infarction. Electronically Signed   By: Guadlupe Spanish M.D.   On: 06/19/2019 16:28   CT ABDOMEN PELVIS W CONTRAST  Result Date: 06/19/2019 CLINICAL DATA:  59 year old female with abdominal pain, nausea vomiting. EXAM: CT ABDOMEN AND PELVIS WITH CONTRAST TECHNIQUE: Multidetector CT imaging of the abdomen and pelvis was performed using the standard protocol following bolus administration of intravenous contrast. CONTRAST:  OMNIPAQUE IOHEXOL 300 MG/ML  SOLN COMPARISON:  CT abdomen pelvis dated 10/03/2018. FINDINGS: Evaluation of this exam is limited due to respiratory motion artifact. Evaluation is also limited due to streak artifact caused by patient's arms. Lower chest: Minimal bibasilar atelectatic changes. The visualized lung bases are otherwise clear. No intra-abdominal free air or free fluid. Hepatobiliary: The liver is unremarkable. No intrahepatic biliary ductal dilatation. The gallbladder is unremarkable. Mild dilatation of the common bile duct measuring up to 13 mm similar to prior CT. No calcified stone noted in the central CBD. Pancreas: Unremarkable. No pancreatic ductal dilatation or surrounding inflammatory changes. Spleen: Normal in size without focal abnormality. Adrenals/Urinary Tract: The right adrenal gland is unremarkable. Mild thickening of the left adrenal gland  similar to prior CT. There is no hydronephrosis on either side. There is symmetric enhancement and excretion of contrast by both kidneys. The visualized ureters and urinary bladder appear unremarkable. Stomach/Bowel: There are scattered colonic diverticula without active inflammatory changes. Mild diffuse thickened appearance of the colon, likely related to underdistention. Colitis is less likely. Clinical correlation is recommended. There is no bowel obstruction. The appendix is normal. Vascular/Lymphatic: Mild aortoiliac atherosclerotic disease. The IVC is unremarkable. No portal venous gas. There is no adenopathy. Reproductive: Hysterectomy. No pelvic mass. Other: Small fat containing umbilical hernia. Musculoskeletal: Degenerative changes of the spine. Lower lumbar posterior fusion. No acute osseous pathology. IMPRESSION: 1. Mild diffuse thickened appearance of the colon, likely related to underdistention. Colitis is less likely. Clinical correlation is recommended. No bowel obstruction. Normal appendix. 2. Colonic diverticulosis. 3. Aortic Atherosclerosis (ICD10-I70.0). Electronically Signed   By: Elgie Collard M.D.   On: 06/19/2019 15:41   Scheduled Meds: . amitriptyline  10 mg Oral QHS  . cetirizine  10 mg Oral Daily  . enoxaparin (LOVENOX) injection  40 mg Subcutaneous Q24H  . fluticasone  2 spray Each Nare Daily  . metoCLOPramide (REGLAN) injection  10 mg Intravenous Q8H  . nicotine  21 mg Transdermal Daily  . pantoprazole (PROTONIX) IV  40 mg Intravenous Daily  . polyethylene glycol  17 g Oral Daily  . scopolamine  1 patch Transdermal Q72H  . sodium bicarbonate  650 mg Oral TID  . sodium chloride flush  3 mL Intravenous Once  . sucralfate  1 g Oral TID WC & HS   Continuous Infusions: . sodium chloride 10 mL/hr at 06/19/19 2025  . lactated ringers 125 mL/hr at 06/20/19 1119    LOS: 3 days   Merlene Laughter, DO [facility] PAGER is on AMION  If 7PM-7AM, please contact  night-coverage [email or web address]

## 2019-06-21 DIAGNOSIS — R945 Abnormal results of liver function studies: Secondary | ICD-10-CM

## 2019-06-21 LAB — CBC WITH DIFFERENTIAL/PLATELET
Abs Immature Granulocytes: 0.02 10*3/uL (ref 0.00–0.07)
Basophils Absolute: 0 10*3/uL (ref 0.0–0.1)
Basophils Relative: 1 %
Eosinophils Absolute: 0 10*3/uL (ref 0.0–0.5)
Eosinophils Relative: 0 %
HCT: 46.2 % — ABNORMAL HIGH (ref 36.0–46.0)
Hemoglobin: 15.9 g/dL — ABNORMAL HIGH (ref 12.0–15.0)
Immature Granulocytes: 0 %
Lymphocytes Relative: 20 %
Lymphs Abs: 1.7 10*3/uL (ref 0.7–4.0)
MCH: 30 pg (ref 26.0–34.0)
MCHC: 34.4 g/dL (ref 30.0–36.0)
MCV: 87.2 fL (ref 80.0–100.0)
Monocytes Absolute: 0.6 10*3/uL (ref 0.1–1.0)
Monocytes Relative: 7 %
Neutro Abs: 6.2 10*3/uL (ref 1.7–7.7)
Neutrophils Relative %: 72 %
Platelets: 291 10*3/uL (ref 150–400)
RBC: 5.3 MIL/uL — ABNORMAL HIGH (ref 3.87–5.11)
RDW: 13.5 % (ref 11.5–15.5)
WBC: 8.5 10*3/uL (ref 4.0–10.5)
nRBC: 0 % (ref 0.0–0.2)

## 2019-06-21 LAB — COMPREHENSIVE METABOLIC PANEL
ALT: 51 U/L — ABNORMAL HIGH (ref 0–44)
AST: 43 U/L — ABNORMAL HIGH (ref 15–41)
Albumin: 3.8 g/dL (ref 3.5–5.0)
Alkaline Phosphatase: 77 U/L (ref 38–126)
Anion gap: 11 (ref 5–15)
BUN: 10 mg/dL (ref 6–20)
CO2: 21 mmol/L — ABNORMAL LOW (ref 22–32)
Calcium: 8.9 mg/dL (ref 8.9–10.3)
Chloride: 101 mmol/L (ref 98–111)
Creatinine, Ser: 0.69 mg/dL (ref 0.44–1.00)
GFR calc Af Amer: >60 mL/min (ref >60)
GFR calc non Af Amer: >60 mL/min (ref >60)
Glucose, Bld: 122 mg/dL — ABNORMAL HIGH (ref 70–99)
Potassium: 3.6 mmol/L (ref 3.5–5.1)
Sodium: 133 mmol/L — ABNORMAL LOW (ref 135–145)
Total Bilirubin: 1.5 mg/dL — ABNORMAL HIGH (ref 0.3–1.2)
Total Protein: 7.5 g/dL (ref 6.5–8.1)

## 2019-06-21 LAB — MAGNESIUM: Magnesium: 2.1 mg/dL (ref 1.7–2.4)

## 2019-06-21 LAB — PHOSPHORUS: Phosphorus: 3.2 mg/dL (ref 2.5–4.6)

## 2019-06-21 MED ORDER — HYDRALAZINE HCL 20 MG/ML IJ SOLN
10.0000 mg | Freq: Once | INTRAMUSCULAR | Status: AC
  Administered 2019-06-21: 10 mg via INTRAVENOUS
  Filled 2019-06-21: qty 1

## 2019-06-21 MED ORDER — POTASSIUM CHLORIDE 10 MEQ/100ML IV SOLN
10.0000 meq | INTRAVENOUS | Status: AC
  Administered 2019-06-21 – 2019-06-22 (×2): 10 meq via INTRAVENOUS
  Filled 2019-06-21: qty 100

## 2019-06-21 MED ORDER — POTASSIUM CHLORIDE 10 MEQ/100ML IV SOLN
INTRAVENOUS | Status: AC
  Administered 2019-06-21: 10 meq
  Filled 2019-06-21: qty 100

## 2019-06-21 NOTE — Progress Notes (Signed)
Subjective: Patient states that the nausea has improved and the last episode of vomiting was yesterday evening, she reports having a formed bowel movement today and denies abdominal pain. She does not want her diet to be advanced at present.  Objective: Vital signs in last 24 hours: Temp:  [97.8 F (36.6 C)-99.5 F (37.5 C)] 98.4 F (36.9 C) (04/11 0508) Pulse Rate:  [90-96] 96 (04/11 0508) Resp:  [16-18] 18 (04/11 0508) BP: (132-181)/(83-116) 132/83 (04/11 0607) SpO2:  [97 %-100 %] 97 % (04/11 0508) Weight change:  Last BM Date: 06/18/19  PE: Lying on bed, moist mucous membrane GENERAL: No pallor, no icterus ABDOMEN: Soft, nondistended, nontender, normoactive bowel sounds EXTREMITIES: Left arm with IV appears erythematous(patient's nurse aware to change IV site)  Lab Results: Results for orders placed or performed during the hospital encounter of 06/16/19 (from the past 48 hour(s))  Comprehensive metabolic panel     Status: Abnormal   Collection Time: 06/19/19  1:23 PM  Result Value Ref Range   Sodium 136 135 - 145 mmol/L   Potassium 3.3 (L) 3.5 - 5.1 mmol/L   Chloride 102 98 - 111 mmol/L   CO2 16 (L) 22 - 32 mmol/L   Glucose, Bld 182 (H) 70 - 99 mg/dL    Comment: Glucose reference range applies only to samples taken after fasting for at least 8 hours.   BUN 7 6 - 20 mg/dL   Creatinine, Ser 3.22 0.44 - 1.00 mg/dL   Calcium 9.0 8.9 - 02.5 mg/dL   Total Protein 8.2 (H) 6.5 - 8.1 g/dL   Albumin 4.2 3.5 - 5.0 g/dL   AST 38 15 - 41 U/L   ALT <5 0 - 44 U/L    Comment: REPEATED TO VERIFY   Alkaline Phosphatase 81 38 - 126 U/L   Total Bilirubin 1.0 0.3 - 1.2 mg/dL   GFR calc non Af Amer >60 >60 mL/min   GFR calc Af Amer >60 >60 mL/min   Anion gap 18 (H) 5 - 15    Comment: Performed at [facility], [address], [place], [place] [address]  Magnesium     Status: None   Collection Time: 06/19/19  1:23 PM  Result Value Ref Range   Magnesium 2.0 1.7 - 2.4  mg/dL    Comment: Performed at [facility], [place], [address] [place], [place] [address]  Phosphorus     Status: Abnormal   Collection Time: 06/19/19  1:23 PM  Result Value Ref Range   Phosphorus 2.3 (L) 2.5 - 4.6 mg/dL    Comment: Performed at [facility], [address], [place], [place] [address]  Culture, blood (routine x 2)     Status: None (Preliminary result)   Collection Time: 06/19/19  1:36 PM   Specimen: BLOOD  Result Value Ref Range   Specimen Description      BLOOD LEFT ARM Performed at [facility], [address], [place], [place] [address]    Special Requests      BOTTLES DRAWN AEROBIC AND ANAEROBIC Blood Culture adequate volume Performed at [facility], [address], [place], [place] [address]    Culture      NO GROWTH 2 DAYS Performed at [facility], [address] [place], [place] [address]    Report Status PENDING   Culture, blood (routine x 2)     Status: None (Preliminary result)   Collection Time: 06/19/19  1:42 PM   Specimen: BLOOD LEFT HAND  Result Value Ref Range   Specimen Description      BLOOD LEFT HAND Performed at [facility] [address] [place], [place] [address]    Special Requests      BOTTLES DRAWN AEROBIC ONLY Blood Culture adequate volume Performed at [facility] [address], [place], [place] [address]    Culture      NO GROWTH 2 DAYS Performed at [facility], [place] [address], [place], [place] [address]    Report Status PENDING   Blood gas, arterial     Status: Abnormal   Collection Time: 06/19/19  2:20 PM  Result Value Ref Range   FIO2 21.00    pH, Arterial 7.454 (H) 7.350 - 7.450   pCO2 arterial 30.4 (L) 32.0 - 48.0 mmHg   pO2, Arterial 78.6 (L) 83.0 - 108.0 mmHg   Bicarbonate 21.0 20.0 - 28.0 mmol/L   Acid-base deficit 1.6 0.0 - 2.0 mmol/L   O2 Saturation 94.4 %   Patient temperature 98.6     Allens test (pass/fail) PASS PASS    Comment: Performed at [facility], [place] [address] [place], [place] [address]  CBC with Differential/Platelet     Status: Abnormal   Collection Time: 06/20/19  5:54 AM  Result Value Ref Range   WBC 9.5 4.0 - 10.5 K/uL   RBC 5.28 (H) 3.87 - 5.11 MIL/uL   Hemoglobin 15.5 (H) 12.0 - 15.0 g/dL   HCT 45.1 36.0 - 46.0 %   MCV 85.4 80.0 - 100.0 fL   MCH 29.4 26.0 - 34.0 pg   MCHC 34.4 30.0 - 36.0 g/dL   RDW 13.5 11.5 - 15.5 %   Platelets 303 150 - 400 K/uL   nRBC 0.0 0.0 - 0.2 %   Neutrophils Relative % 77 %   Neutro Abs 7.2 1.7 - 7.7 K/uL   Lymphocytes Relative 16 %   Lymphs Abs 1.5 0.7 - 4.0 K/uL   Monocytes Relative 7 %   Monocytes Absolute 0.7 0.1 - 1.0 K/uL   Eosinophils Relative 0 %   Eosinophils Absolute 0.0 0.0 - 0.5 K/uL   Basophils Relative 0 %   Basophils Absolute 0.0 0.0 - 0.1 K/uL   Immature Granulocytes 0 %   Abs Immature Granulocytes 0.03 0.00 - 0.07 K/uL    Comment: Performed at [facility], [place] [address] [place], [place] [address]  Comprehensive metabolic panel     Status: Abnormal   Collection Time: 06/20/19  5:54 AM  Result Value Ref Range   Sodium 135 135 - 145 mmol/L   Potassium 3.4 (L) 3.5 - 5.1 mmol/L   Chloride 100 98 - 111 mmol/L   CO2 21 (L) 22 - 32 mmol/L   Glucose, Bld 150 (H) 70 - 99 mg/dL    Comment: Glucose reference range applies only to samples taken after fasting for at least 8 hours.   BUN 8 6 - 20 mg/dL   Creatinine, Ser 0.54 0.44 - 1.00 mg/dL   Calcium 9.1 8.9 - 10.3 mg/dL   Total Protein 7.9 6.5 - 8.1 g/dL   Albumin 3.9 3.5 - 5.0 g/dL   AST 34 15 - 41 U/L   ALT 39 0 - 44 U/L   Alkaline Phosphatase 75 38 - 126 U/L   Total Bilirubin 1.1 0.3 - 1.2 mg/dL   GFR calc non Af Amer >60 >60 mL/min  GFR calc Af Amer >60 >60 mL/min   Anion gap 14 5 - 15    Comment: Performed at [facility], [address], [place], [place] [address]  Magnesium      Status: None   Collection Time: 06/20/19  5:54 AM  Result Value Ref Range   Magnesium 2.1 1.7 - 2.4 mg/dL    Comment: Performed at [facility], [address], [place], [place] [address]  Phosphorus     Status: None   Collection Time: 06/20/19  5:54 AM  Result Value Ref Range   Phosphorus 4.1 2.5 - 4.6 mg/dL    Comment: Performed at [facility], [address] [place], [place] [address]  CBC with Differential/Platelet     Status: Abnormal   Collection Time: 06/21/19  5:56 AM  Result Value Ref Range   WBC 8.5 4.0 - 10.5 K/uL   RBC 5.30 (H) 3.87 - 5.11 MIL/uL   Hemoglobin 15.9 (H) 12.0 - 15.0 g/dL   HCT 32.9 (H) 92.4 - 26.8 %   MCV 87.2 80.0 - 100.0 fL   MCH 30.0 26.0 - 34.0 pg   MCHC 34.4 30.0 - 36.0 g/dL   RDW 34.1 96.2 - 22.9 %   Platelets 291 150 - 400 K/uL   nRBC 0.0 0.0 - 0.2 %   Neutrophils Relative % 72 %   Neutro Abs 6.2 1.7 - 7.7 K/uL   Lymphocytes Relative 20 %   Lymphs Abs 1.7 0.7 - 4.0 K/uL   Monocytes Relative 7 %   Monocytes Absolute 0.6 0.1 - 1.0 K/uL   Eosinophils Relative 0 %   Eosinophils Absolute 0.0 0.0 - 0.5 K/uL   Basophils Relative 1 %   Basophils Absolute 0.0 0.0 - 0.1 K/uL   Immature Granulocytes 0 %   Abs Immature Granulocytes 0.02 0.00 - 0.07 K/uL    Comment: Performed at [facility], [address], [place], [place] [address]  Comprehensive metabolic panel     Status: Abnormal   Collection Time: 06/21/19  5:56 AM  Result Value Ref Range   Sodium 133 (L) 135 - 145 mmol/L   Potassium 3.6 3.5 - 5.1 mmol/L   Chloride 101 98 - 111 mmol/L   CO2 21 (L) 22 - 32 mmol/L   Glucose, Bld 122 (H) 70 - 99 mg/dL    Comment: Glucose reference range applies only to samples taken after fasting for at least 8 hours.   BUN 10 6 - 20 mg/dL   Creatinine, Ser 1.19 0.44 - 1.00 mg/dL   Calcium 8.9 8.9 - 41.7 mg/dL   Total Protein 7.5 6.5 - 8.1 g/dL   Albumin 3.8 3.5 - 5.0 g/dL   AST 43 (H) 15 - 41 U/L    ALT 51 (H) 0 - 44 U/L   Alkaline Phosphatase 77 38 - 126 U/L   Total Bilirubin 1.5 (H) 0.3 - 1.2 mg/dL   GFR calc non Af Amer >60 >60 mL/min   GFR calc Af Amer >60 >60 mL/min   Anion gap 11 5 - 15    Comment: Performed at [facility], [address], [place], [place] [address]  Magnesium     Status: None   Collection Time: 06/21/19  5:56 AM  Result Value Ref Range   Magnesium 2.1 1.7 - 2.4 mg/dL    Comment: Performed at [facility], [address] [place], [place] [address]  Phosphorus  Status: None   Collection Time: 06/21/19  5:56 AM  Result Value Ref Range   Phosphorus 3.2 2.5 - 4.6 mg/dL    Comment: Performed at [facility], [address], [place], [place] [address]    Studies/Results: MR BRAIN WO CONTRAST  Result Date: 06/19/2019 CLINICAL DATA:  Dizziness EXAM: MRI HEAD WITHOUT CONTRAST TECHNIQUE: Multiplanar, multiecho pulse sequences of the brain and surrounding structures were obtained without intravenous contrast. COMPARISON:  None. FINDINGS: DWI, axial T2, and sagittal T1 sequences were obtained. Patient could not tolerate remainder of the study. Brain: There is no acute infarction. Ventricles and sulci are normal in size and configuration. There is no mass lesion or mass effect. No hydrocephalus. Vascular: Major vessel flow voids at the skull base are preserved within limitation of motion artifact. Skull and upper cervical spine: Normal marrow signal is preserved. Sinuses/Orbits: Patchy left posterior ethmoid mucosal thickening. Orbits are unremarkable. Other: Mastoid air cells are clear. IMPRESSION: Partial study demonstrate no acute infarction. Electronically Signed   By: Guadlupe Spanish M.D.   On: 06/19/2019 16:28   CT ABDOMEN PELVIS W CONTRAST  Result Date: 06/19/2019 CLINICAL DATA:  59 year old female with abdominal pain, nausea vomiting. EXAM: CT ABDOMEN AND PELVIS WITH CONTRAST TECHNIQUE: Multidetector CT  imaging of the abdomen and pelvis was performed using the standard protocol following bolus administration of intravenous contrast. CONTRAST:  OMNIPAQUE IOHEXOL 300 MG/ML  SOLN COMPARISON:  CT abdomen pelvis dated 10/03/2018. FINDINGS: Evaluation of this exam is limited due to respiratory motion artifact. Evaluation is also limited due to streak artifact caused by patient's arms. Lower chest: Minimal bibasilar atelectatic changes. The visualized lung bases are otherwise clear. No intra-abdominal free air or free fluid. Hepatobiliary: The liver is unremarkable. No intrahepatic biliary ductal dilatation. The gallbladder is unremarkable. Mild dilatation of the common bile duct measuring up to 13 mm similar to prior CT. No calcified stone noted in the central CBD. Pancreas: Unremarkable. No pancreatic ductal dilatation or surrounding inflammatory changes. Spleen: Normal in size without focal abnormality. Adrenals/Urinary Tract: The right adrenal gland is unremarkable. Mild thickening of the left adrenal gland similar to prior CT. There is no hydronephrosis on either side. There is symmetric enhancement and excretion of contrast by both kidneys. The visualized ureters and urinary bladder appear unremarkable. Stomach/Bowel: There are scattered colonic diverticula without active inflammatory changes. Mild diffuse thickened appearance of the colon, likely related to underdistention. Colitis is less likely. Clinical correlation is recommended. There is no bowel obstruction. The appendix is normal. Vascular/Lymphatic: Mild aortoiliac atherosclerotic disease. The IVC is unremarkable. No portal venous gas. There is no adenopathy. Reproductive: Hysterectomy. No pelvic mass. Other: Small fat containing umbilical hernia. Musculoskeletal: Degenerative changes of the spine. Lower lumbar posterior fusion. No acute osseous pathology. IMPRESSION: 1. Mild diffuse thickened appearance of the colon, likely related to  underdistention. Colitis is less likely. Clinical correlation is recommended. No bowel obstruction. Normal appendix. 2. Colonic diverticulosis. 3. Aortic Atherosclerosis (ICD10-I70.0). Electronically Signed   By: Elgie Collard M.D.   On: 06/19/2019 15:41    Medications: I have reviewed the patient's current medications.  Assessment: Intractable nausea vomiting without obvious etiology CT of the abdomen and pelvis, MRI of the brain unremarkable,  HIDA unremarkable Prior gastric emptying scan, EGD and biopsies unremarkable  Minimally elevated LFTs, T bili 1.5/AST 43/ALT 51/ALP 77  Plan: Encouraged patient to eat, possibly start with dry crackers and advance as tolerated Patient on lactated Ringer's at 125 mL/h, to be continued  until patient is able to tolerate oral diet Adequately Was given Reglan 10 mg IV every 8 hours x3 doses yesterday Continue supportive care, continue pantoprazole 40 mg IV daily, sucralfate suspension, scopcolamine patch every 72 hours, Zofran as needed.  Kerin Salen, MD 06/21/2019, 12:58 PM

## 2019-06-21 NOTE — Progress Notes (Signed)
PROGRESS NOTE    Jenayah Antu  ZOX:[id number] DOB: Jan 22, 1961 DOA: 06/16/2019 PCP: Casimer Lanius, MD   Brief Narrative:  HPI per Dr. Sanda Klein on 06/16/2019  Shavon Ashmore is a 59 y.o. female with medical history significant of rheumatoid arthritis, chronic back pain, cyclical vomiting who is coming to the emergency department due to multiple episodes of nausea and emesis since yesterday.  The symptoms are typical of her cyclical vomiting flares.  She denies fever, chills, night sweats, hematemesis, diarrhea, melena or hematochezia.  She denies dyspnea, chest pain, palpitations, dizziness, diaphoresis, PND, orthopnea or pitting edema of the lower extremities.  No dysuria, frequency or materia.  Denies polyuria, polydipsia, polyphagia or blurred vision.  ED Course: Initial vital signs temperature 97.5 F, pulse 71, respiration of 13, blood pressure 130/85 mmHg and O2 sat 93% on room air.  The patient received a 1000 mL NS bolus, 8 mg of morphine IVP, 10 mg of metoclopramide IVP and 0.5 mg lorazepam IV.  CBC was normal.  Lipase was normal.  CMP shows a glucose of 129 mg/dL, all other values are within normal range.  Three-way abdomen does not show any bowel dilatation, there is occasional air-fluid levels noted.  There is questionable enteritis or ileus.  Bowel obstruction was felt to be less likely.  Please see images and full radiology report for further detail.  **Interim History  She continued to have intractable nausea or vomiting spite being on scheduled Reglan and as needed Zofran.  Because of this gastroenterology was consulted for further evaluation and recommendations and they are recommending a HIDA scan and head CT scan without contrast to rule out mass lesions or other serious causes of dizziness concurrent nausea and vomiting.    06/18/19 She continues to endorse diffuse abdominal pain but gastroenterology feels this may be secondary to chronic opioid usage.  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  GI recommended continuing as needed antiemetics and continue Protonix need added Carafate.  GI was in agreement with the scopolamine patch the patient continues to be extremely upset at the reason why she continues to have nausea vomiting and believes it secondary to her hiatal hernia however her hiatal hernia was very small per my discussion with Dr. Levora Angel.  06/19/19 She continues to be nauseous with abdominal pain and vomiting so GI is ordered her a CT abdomen and pelvis.  She acutely became confused this afternoon per nursing and had a metabolic acidosis so she was started on sodium bicarbonate drip and an MRI was done.  Because of her confusion she also had an ABG.  MRI was unrevealing.  Patient did have some dizziness when she turns her head to the side with question for BPPV.  06/20/19 Gastroenterology has now readded metoclopramide.  She continues to be nauseous vomit and have abdominal pain but states her dizziness is improved.  06/21/19 She is improving and is not as nauseous today states that she just got some nausea medication.  She did vomit last night and is having formed bowel movements.  GI recommends continuing current diet as the patient does not want her diet advanced currently and recommending starting possibly with dry crackers and advancing diet as tolerated.  GI recommends continuing IV lactated Ringer's at 125 mils per hour until patient is adequately able to tolerate an oral diet.  She is status post Reglan now.  GI recommends continue supportive care at this time with antiemetics as below.  Assessment & Plan:   Principal Problem:   Intractable cyclical vomiting Active Problems:   Rheumatoid arteritis (HCC)  Intractable Cyclical Nausea and Vomiting Associated with abdominal pain, unclear etiology; continued vomiting  and dry heaving occasionally -Admit to Med/Surge . -Initially kept n.p.o. but now GI recommending a soft diet; continues to be nauseous and having abdominal pain and vomiting but patient is not eating very much and does not want her diet advanced so she is just been drinking clears -Changed IV fluids to sodium bicarbonate given her metabolic acidosis but since gastroenterology is changing her to lactated Ringer's at 125 mL/h to avoid dehydration we will stop the sodium bicarb and provide her sodium bicarbonate tablets; her acidosis improved -D/G Abdomen showed "No bowel dilatation. Occasional air-fluid levels noted. Question a degree of enteritis or ileus. Bowel obstruction felt to be less likely. No free air. Lungs clear." -C/w Analgesics as needed with Judicious use of Narcotics -C/w Antiemetics with Ondansetron 4 mg po/IV q6hprn Nausea and  -Was on metoclopramide 10 mg IV q6h however now the metoclopramide has been stopped gastroenterology has now reinitiated this at 10 mg IV every 8 hours scheduled for 3 doses and these doses have been given -Because had no improvement Gastroenterology was consulted for further evaluation recommendations and recommending a HIDA scan today as well as a head CT scan to rule out mass lesions or other serious causes with dizziness with concurrent nausea or vomiting -HIDA showed that the cyst in the common bile duct were patent however no EF was done as patient was unable to drink the Ensure portion of the to calculate the ejection fraction -Head CT done and showed no acute intracranial hemorrhage, mass-effect or evidence of acute infarction -Gastroenterology recommend continue supportive care as well as antiemetics and they were in agreement with the Scopolamine patch that I have ordered -They also recommend a trial of MiraLAX to see if constipation is contributing to her symptoms and recommending decreasing/limiting narcotic usage -Continue with IV PPI with  Pantoprazole 40 mg in the a.m. added Carafate -We will monitor patient's clinical response to intervention and follow up on gastroenterology recommendations -Check UDS and U/A and still pending essentially unremarkable except that she had positive opiates in her UDS -Gastroenterology also checked CT abdomen pelvis with contrast and showed "Mild diffuse thickened appearance of the colon, likely related to underdistention. Colitis is less likely. Clinical correlation is recommended. No bowel obstruction. Normal appendix. Colonic diverticulosis. Aortic Atherosclerosis." -Continue to monitor carefully and work-up for her vestibular issues as below and have ordered an MRI today which was negative -I have asked PT to do vestibular testing with patient and she was started on meclizine and will continue; PT recommending home health currently -Gastroenterology recommending continue meclizine 25 mg p.o. 3 times daily as needed, Zofran 4 mg IV 6 hours as needed, pantoprazole 40 m IV daily, scopolamine transdermal patch every 72 hours for her nausea and vomiting -C/w Amitriptyline started by GI -Patient is also on Carafate but will change to suspension at 1 g 3 times daily with meals and at bedtime -Appreciate further care per Gastroenterology and GI recommends encourage the patient to eat and possibly starting with dry crackers and advancing the diet as tolerated as well as continuing lactated Ringer's at 125 MLS per hour until she can tolerate oral diet adequately and they recommend continuing supportive care  High Anion Gap Metabolic Acidosis Lactic Acidosis -  Clean the setting of nausea and vomiting -Patient's CO2 was 16, anion gap was 18, chloride level was 102; her CO2 is now 21, anion gap is 11, and chloride levels 101 -Fluids changed to sodium bicarbonate at 75 MLS per hour yesterday and have now been stopped given that she has been started on lactated Ringer's at 125 mL's per hour by gastroenterology;  normal saline 500 mL bolus given the day before yesterday -Continue with p.o. sodium bicarbonate tablets 650 mg p.o. 3 times daily -Continue to Monitor and Trend -Repeat CMP in AM   Rheumatoid Arteritis (HCC) -Hold Methotrexate. -Judicious use of opiate pain medications -Continue acetaminophen  Obesity -Estimated body mass index is 30.41 kg/m as calculated from the following:   Height as of this encounter:  (1.727 m).   Weight as of this encounter: 90.7 kg. -Weight Loss and Dietary Counseling given   Hyperglycemia in a patient with a history of prediabetes -Check HbA1c in the AM as last hemoglobin A1c was 5.9 almost a year ago -Blood sugars have been ranging from 87-182 on daily CMP's'; this a.m. it was 122 on the CMP -If necessary will place on sensitive NovoLog/scale insulin  Tobacco Abuse -Smoking cessation counseling given -Continue nicotine 21 mg transdermally every 24 hours daily  Dizziness, improved  -Head CT being obtained by Gastroenterology was negative and showed no acute intracranial hemorrhage, mass effect, or evidence of acute infarction -Will try Scopolamine Patch patient still continues to be dizzy with movement to the right -Will also obtain a PT Vestibular Consult and they are recommending home health PT -MRI done and as below -Try Meclizine we will continue 25 mg p.o. 3 times daily as needed for dizziness nausea -She was started on sodium bicarbonate drip yesterday but this did not been stopped as gastroenterology has placed her on lactated Ringer's at 125 mL's per hour -She may have something vestibular going on and will test this to see if this is contributing to her dizziness which is then in turn causing her nausea and vomiting  Acute encephalopathy and confusion, improving  -Unclear etiology but ordered an ABG which showed metabolic alkalosis -MRI done and showed "Partial study demonstrate no acute infarction. -Continue with Delirium  precautions -Obtain blood cultures x2 and showed NGTD at 2 days  -ABG done and showed pH of 7.454, PCO2 of 30.4, PO2 of 78.6, ABG O2 saturation of 94.4% with a bicarbonate level of 21.0 and FiO2 of 21.0 -Continue to monitor and her confusion is improved but she is more withdrawn and continues to have some abdominal pain, nausea and vomiting  Hypophosphatemia -Patient's Phos level was 2.3 and today is 3.2 -Continue monitor and replete as necessary -Repeat phosphorus level in a.m.  Hypokalemia -Patient's potassium this morning was 3.6 -Replete with IV KCl 40 mEq again -Continue to monitor and replete as necessary -Repeat CMP in a.m.   Erythrocytosis -Patient is a smoker and likely has some component of dehydration -Gastroenterology has restarted her IV fluids at 125 an hour of lactated Ringer's -Her crit went from 15.5/45.1 is now 15.9/46.2 -Continue monitor and trend and repeat CBC in a.m.  Abnormal LFTs -Patient's AST was 34 yesterday and worsened to 43 and ALT went from 39 and worsened to 51 -Continue to monitor and trend hepatic function panel -If continues to worsen or not improving will obtain a right upper quadrant ultrasound as well as an acute hepatitis panel -Repeat CMP in the a.m.  Hyperbilirubinemia  -Mild and slightly worsening  -  Patient's T bili went from 0.6 and is now 1.5 -Continue to monitor and trend hepatic function panel -Repeat CMP in a.m.  DVT prophylaxis: Enoxaparin 40 mg sq q24h Code Status: FULL CODE  Family Communication: No family present at bedside  Disposition Plan: Pending further gastroenterology evaluation and diet tolerance.  Patient is from home and likely will be discharged back home when she is able to tolerate a diet and is no longer nauseous and vomiting.  She has several studies for further work-up that are needing to be done and she is being continued on antiemetics.  GI recommends slow diet advancement and did not have a clear etiology of  her nausea vomiting.  Consultants:   Gastroenterology    Procedures:  HIDA Scan  CT Abd/Pelvis MRI   Antimicrobials:  Anti-infectives (From admission, onward)   None     Subjective: Seen and examined at bedside and she was resting and states that her abdominal pain is improved along with her nausea.  She did vomit last night.  No chest pain, lightheadedness or dizziness.  Having some bowel movements.  Does not want to try anything more than clears currently.  No other concerns or complaints at this time.  Objective: Vitals:   06/20/19 2148 06/21/19 0508 06/21/19 0544 06/21/19 0607  BP: (!) 146/108 (!) 181/116 (!) 181/116 132/83  Pulse: 90 96    Resp: 16 18    Temp: 99.5 F (37.5 C) 98.4 F (36.9 C)    TempSrc: Oral Oral    SpO2: 100% 97%    Weight:      Height:        Intake/Output Summary (Last 24 hours) at 06/21/2019 1313 Last data filed at 06/21/2019 0600 Gross per 24 hour  Intake 1761.75 ml  Output 1000 ml  Net 761.75 ml   Filed Weights   06/17/19 0500  Weight: 90.7 kg   Examination: Physical Exam:  Constitutional: WN/WD African-American female who appears more comfortable today but still resting in bed.  Not as nauseous but does had some nausea medication. Eyes: Lids and conjunctivae normal, sclerae anicteric  ENMT: External Ears, Nose appear normal. Grossly normal hearing.  Neck: Appears normal, supple, no cervical masses, normal ROM, no appreciable thyromegaly neck: No JVD Respiratory: Mildly diminished to auscultation bilaterally with unlabored breathing, no wheezing, rales, rhonchi or crackles. Normal respiratory effort and patient is not tachypenic. No accessory muscle use.  Cardiovascular: RRR, no murmurs / rubs / gallops. S1 and S2 auscultated. No extremity edema. Abdomen: Soft, very slightly-tender, distended secondary body habitus. Bowel sounds positive.  GU: Deferred. Musculoskeletal: No clubbing / cyanosis of digits/nails. No joint deformity  upper and lower extremities.  Skin: No rashes, lesions, ulcers on limited skin evaluation. No induration; Warm and dry.  Neurologic: CN 2-12 grossly intact with no focal deficits. Romberg sign and cerebellar reflexes not assessed.  Psychiatric: Normal judgment and insight.  She is resting but alert and oriented x 3.  Slightly depressed appearing and has a flat affect and mood.   Data Reviewed: I have personally reviewed following labs and imaging studies  CBC: Recent Labs  Lab 06/17/19 0945 06/18/19 0539 06/19/19 0539 06/20/19 0554 06/21/19 0556  WBC 5.5 5.8 5.6 9.5 8.5  NEUTROABS 3.6 2.9 4.3 7.2 6.2  HGB 13.0 12.4 13.8 15.5* 15.9*  HCT 38.7 37.6 41.5 45.1 46.2*  MCV 89.8 89.3 89.6 85.4 87.2  PLT 265 259 252 303 291   Basic Metabolic Panel: Recent Labs  Lab 06/18/19 0539  06/19/19 0539 06/19/19 1323 06/20/19 0554 06/21/19 0556  NA 141 139 136 135 133*  K 3.5 3.7 3.3* 3.4* 3.6  CL 111 103 102 100 101  CO2 20* 18* 16* 21* 21*  GLUCOSE 87 145* 182* 150* 122*  BUN 9 6 7 8 10   CREATININE 0.75 0.69 0.92 0.54 0.69  CALCIUM 8.6* 9.3 9.0 9.1 8.9  MG 1.9 2.0 2.0 2.1 2.1  PHOS 3.4 2.5 2.3* 4.1 3.2   GFR: Estimated Creatinine Clearance: 91.4 mL/min (by C-G formula based on SCr of 0.69 mg/dL). Liver Function Tests: Recent Labs  Lab 06/18/19 0539 06/19/19 0539 06/19/19 1323 06/20/19 0554 06/21/19 0556  AST 17 33 38 34 43*  ALT 12 29 <5 39 51*  ALKPHOS 60 69 81 75 77  BILITOT 1.0 1.0 1.0 1.1 1.5*  PROT 6.6 8.0 8.2* 7.9 7.5  ALBUMIN 3.4* 4.1 4.2 3.9 3.8   Recent Labs  Lab 06/16/19 1103  LIPASE 27   No results for input(s): AMMONIA in the last 168 hours. Coagulation Profile: No results for input(s): INR, PROTIME in the last 168 hours. Cardiac Enzymes: No results for input(s): CKTOTAL, CKMB, CKMBINDEX, TROPONINI in the last 168 hours. BNP (last 3 results) No results for input(s): PROBNP in the last 8760 hours. HbA1C: Recent Labs    06/19/19 0539  HGBA1C 5.9*    CBG: No results for input(s): GLUCAP in the last 168 hours. Lipid Profile: No results for input(s): CHOL, HDL, LDLCALC, TRIG, CHOLHDL, LDLDIRECT in the last 72 hours. Thyroid Function Tests: No results for input(s): TSH, T4TOTAL, FREET4, T3FREE, THYROIDAB in the last 72 hours. Anemia Panel: No results for input(s): VITAMINB12, FOLATE, FERRITIN, TIBC, IRON, RETICCTPCT in the last 72 hours. Sepsis Labs: Recent Labs  Lab 06/19/19 0911 06/19/19 1143  LATICACIDVEN 1.1 2.9*    Recent Results (from the past 240 hour(s))  SARS CORONAVIRUS 2 (TAT 6-24 HRS) Nasopharyngeal Nasopharyngeal Swab     Status: None   Collection Time: 06/16/19  2:26 PM   Specimen: Nasopharyngeal Swab  Result Value Ref Range Status   SARS Coronavirus 2 NEGATIVE NEGATIVE Final    Comment: (NOTE) SARS-CoV-2 target nucleic acids are NOT DETECTED. The SARS-CoV-2 RNA is generally detectable in upper and lower respiratory specimens during the acute phase of infection. Negative results do not preclude SARS-CoV-2 infection, do not rule out co-infections with other pathogens, and should not be used as the sole basis for treatment or other patient management decisions. Negative results must be combined with clinical observations, patient history, and epidemiological information. The expected result is Negative. Fact Sheet for Patients: 08/16/19 Fact Sheet for Healthcare Providers: HairSlick.no This test is not yet approved or cleared by the quierodirigir.com FDA and  has been authorized for detection and/or diagnosis of SARS-CoV-2 by FDA under an Emergency Use Authorization (EUA). This EUA will remain  in effect (meaning this test can be used) for the duration of the COVID-19 declaration under Section 56 4(b)(1) of the Act, 21 U.S.C. section 360bbb-3(b)(1), unless the authorization is terminated or revoked sooner. Performed at [facility], [address] [place], [place]   Culture, blood (routine x 2)     Status: None (Preliminary result)   Collection Time: 06/19/19  1:36 PM   Specimen: BLOOD  Result Value Ref Range Status   Specimen Description   Final    BLOOD LEFT ARM Performed at [facility], [address], [place], [place]    Special  Requests   Final    BOTTLES DRAWN AEROBIC AND ANAEROBIC Blood Culture adequate volume Performed at [facility], [address] [place], [place] [address]    Culture   Final    NO GROWTH 2 DAYS Performed at [facility], [address], [place], [place] [address]    Report Status PENDING  Incomplete  Culture, blood (routine x 2)     Status: None (Preliminary result)   Collection Time: 06/19/19  1:42 PM   Specimen: BLOOD LEFT HAND  Result Value Ref Range Status   Specimen Description   Final    BLOOD LEFT HAND Performed at [facility], [address] [place], [place] [address]    Special Requests   Final    BOTTLES DRAWN AEROBIC ONLY Blood Culture adequate volume Performed at [facility], [address] [place], [place] [address]    Culture   Final    NO GROWTH 2 DAYS Performed at [facility], [address] [place], [place] [address]    Report Status PENDING  Incomplete     RN Pressure Injury Documentation:     Estimated body mass index is 30.41 kg/m as calculated from the following:   Height as of this encounter: 5\' 8"  (1.727 m).   Weight as of this encounter: 90.7 kg.  Malnutrition Type:      Malnutrition Characteristics:      Nutrition Interventions:     Radiology Studies: MR BRAIN WO CONTRAST  Result Date: 06/19/2019 CLINICAL DATA:  Dizziness EXAM: MRI HEAD WITHOUT CONTRAST TECHNIQUE: Multiplanar, multiecho pulse sequences of the brain and surrounding structures were obtained without intravenous contrast. COMPARISON:  None. FINDINGS: DWI, axial  T2, and sagittal T1 sequences were obtained. Patient could not tolerate remainder of the study. Brain: There is no acute infarction. Ventricles and sulci are normal in size and configuration. There is no mass lesion or mass effect. No hydrocephalus. Vascular: Major vessel flow voids at the skull base are preserved within limitation of motion artifact. Skull and upper cervical spine: Normal marrow signal is preserved. Sinuses/Orbits: Patchy left posterior ethmoid mucosal thickening. Orbits are unremarkable. Other: Mastoid air cells are clear. IMPRESSION: Partial study demonstrate no acute infarction. Electronically Signed   By: 08/19/2019 M.D.   On: 06/19/2019 16:28   CT ABDOMEN PELVIS W CONTRAST  Result Date: 06/19/2019 CLINICAL DATA:  59 year old female with abdominal pain, nausea vomiting. EXAM: CT ABDOMEN AND PELVIS WITH CONTRAST TECHNIQUE: Multidetector CT imaging of the abdomen and pelvis was performed using the standard protocol following bolus administration of intravenous contrast. CONTRAST:  58 OMNIPAQUE IOHEXOL 300 MG/ML  SOLN COMPARISON:  CT abdomen pelvis dated 10/03/2018. FINDINGS: Evaluation of this exam is limited due to respiratory motion artifact. Evaluation is also limited due to streak artifact caused by patient's arms. Lower chest: Minimal bibasilar atelectatic changes. The visualized lung bases are otherwise clear. No intra-abdominal free air or free fluid. Hepatobiliary: The liver is unremarkable. No intrahepatic biliary ductal dilatation. The gallbladder is unremarkable. Mild dilatation of the common bile duct measuring up to 13 mm similar to prior CT. No calcified stone noted in the central CBD. Pancreas: Unremarkable. No pancreatic ductal dilatation or surrounding inflammatory changes. Spleen: Normal in size without focal abnormality. Adrenals/Urinary Tract: The right adrenal gland is unremarkable. Mild thickening of the left adrenal gland similar to prior CT. There is no  hydronephrosis on either side. There is symmetric enhancement and excretion of contrast  by both kidneys. The visualized ureters and urinary bladder appear unremarkable. Stomach/Bowel: There are scattered colonic diverticula without active inflammatory changes. Mild diffuse thickened appearance of the colon, likely related to underdistention. Colitis is less likely. Clinical correlation is recommended. There is no bowel obstruction. The appendix is normal. Vascular/Lymphatic: Mild aortoiliac atherosclerotic disease. The IVC is unremarkable. No portal venous gas. There is no adenopathy. Reproductive: Hysterectomy. No pelvic mass. Other: Small fat containing umbilical hernia. Musculoskeletal: Degenerative changes of the spine. Lower lumbar posterior fusion. No acute osseous pathology. IMPRESSION: 1. Mild diffuse thickened appearance of the colon, likely related to underdistention. Colitis is less likely. Clinical correlation is recommended. No bowel obstruction. Normal appendix. 2. Colonic diverticulosis. 3. Aortic Atherosclerosis (ICD10-I70.0). Electronically Signed   By: Elgie Collard M.D.   On: 06/19/2019 15:41   Scheduled Meds:  amitriptyline  10 mg Oral QHS   cetirizine  10 mg Oral Daily   enoxaparin (LOVENOX) injection  40 mg Subcutaneous Q24H   fluticasone  2 spray Each Nare Daily   nicotine  21 mg Transdermal Daily   pantoprazole (PROTONIX) IV  40 mg Intravenous Daily   polyethylene glycol  17 g Oral Daily   scopolamine  1 patch Transdermal Q72H   sodium bicarbonate  650 mg Oral TID   sodium chloride flush  3 mL Intravenous Once   sucralfate  1 g Oral TID WC & HS   Continuous Infusions:  sodium chloride 10 mL/hr at 06/19/19 2025   lactated ringers 125 mL/hr at 06/21/19 1029    LOS: 4 days   Merlene Laughter, DO [facility] PAGER is on AMION  If 7PM-7AM, please contact night-coverage [email or web address]

## 2019-06-21 NOTE — Progress Notes (Signed)
Physical Therapy Treatment Patient Details Name: Kristen Ramos MRN: [id number] DOB: 04-02-1960 Today's Date: 06/21/2019    History of Present Illness 59 y.o. female with medical history significant of rheumatoid arthritis, chronic back pain, cyclical vomiting who is coming to the emergency department due to multiple episodes of nausea and emesis.    PT Comments    Progressing slowly with mobility. Pt required encouragement to participate with therapy. She c/o having a headache. She denied dizziness throughout session. She was able to sit up, don socks, ambulate, and return to sidelying-denied dizziness with all tasks. Vestibular testing does not appear to be warranted and pt is not very compliant with participating on today. I did manage to perform supine head roll test since she previously complained of dizziness with head turns. Pt denied dizziness with this as well. No nystagmus noticed when able to visualize pt's eyes-she tended to keep them closed. Will continue to follow and progress activity as pt will allow.     Follow Up Recommendations  Home health PT;Supervision/Assistance - 24 hour     Equipment Recommendations  Rolling walker with 5" wheels    Recommendations for Other Services       Precautions / Restrictions Precautions Precautions: Fall Restrictions Weight Bearing Restrictions: No    Mobility  Bed Mobility Overal bed mobility: Modified Independent             General bed mobility comments: Increased time.  Transfers Overall transfer level: Needs assistance Equipment used: Rolling walker (2 wheeled) Transfers: Sit to/from Stand Sit to Stand: Min assist         General transfer comment: Assist to rise, steady. Increased time. Cues for safety.  Ambulation/Gait Ambulation/Gait assistance: Min assist Gait Distance (Feet): 50 Feet Assistive device: Rolling walker (2 wheeled) Gait Pattern/deviations: Step-through pattern;Decreased stride length      General Gait Details: Unsteady. Assist to stabilize pt throughout distance. Cues for safety. Pt easily distracted-requires cues to attend to task and for safety   Stairs             Wheelchair Mobility    Modified Rankin (Stroke Patients Only)       Balance Overall balance assessment: Needs assistance         Standing balance support: Bilateral upper extremity supported Standing balance-Leahy Scale: Poor                              Cognition Arousal/Alertness: Awake/alert Behavior During Therapy: WFL for tasks assessed/performed Overall Cognitive Status: Within Functional Limits for tasks assessed                                        Exercises      General Comments        Pertinent Vitals/Pain Pain Assessment: Faces Faces Pain Scale: Hurts even more Pain Location: headache Pain Descriptors / Indicators: Aching Pain Intervention(s): Limited activity within patient's tolerance;Monitored during session    Home Living                      Prior Function            PT Goals (current goals can now be found in the care plan section) Progress towards PT goals: Progressing toward goals    Frequency    Min 3X/week      PT  Plan Current plan remains appropriate    Co-evaluation              AM-PAC PT "6 Clicks" Mobility   Outcome Measure  Help needed turning from your back to your side while in a flat bed without using bedrails?: None Help needed moving from lying on your back to sitting on the side of a flat bed without using bedrails?: A Little Help needed moving to and from a bed to a chair (including a wheelchair)?: A Little Help needed standing up from a chair using your arms (e.g., wheelchair or bedside chair)?: A Little Help needed to walk in hospital room?: A Little Help needed climbing 3-5 steps with a railing? : A Lot 6 Click Score: 18    End of Session Equipment Utilized During Treatment:  Gait belt Activity Tolerance: Patient limited by fatigue;Patient limited by pain Patient left: in bed;with call bell/phone within reach;with bed alarm set   PT Visit Diagnosis: Difficulty in walking, not elsewhere classified (R26.2)     Time: 4758-3074 PT Time Calculation (min) (ACUTE ONLY): 19 min  Charges:  $Gait Training: 8-22 mins                         Faye Ramsay, PT Acute Rehabilitation

## 2019-06-22 DIAGNOSIS — R109 Unspecified abdominal pain: Secondary | ICD-10-CM

## 2019-06-22 LAB — CBC WITH DIFFERENTIAL/PLATELET
Abs Immature Granulocytes: 0.01 10*3/uL (ref 0.00–0.07)
Basophils Absolute: 0.1 10*3/uL (ref 0.0–0.1)
Basophils Relative: 1 %
Eosinophils Absolute: 0.1 10*3/uL (ref 0.0–0.5)
Eosinophils Relative: 1 %
HCT: 39.1 % (ref 36.0–46.0)
Hemoglobin: 13 g/dL (ref 12.0–15.0)
Immature Granulocytes: 0 %
Lymphocytes Relative: 34 %
Lymphs Abs: 2.1 10*3/uL (ref 0.7–4.0)
MCH: 29.4 pg (ref 26.0–34.0)
MCHC: 33.2 g/dL (ref 30.0–36.0)
MCV: 88.5 fL (ref 80.0–100.0)
Monocytes Absolute: 0.7 10*3/uL (ref 0.1–1.0)
Monocytes Relative: 11 %
Neutro Abs: 3.3 10*3/uL (ref 1.7–7.7)
Neutrophils Relative %: 53 %
Platelets: 234 10*3/uL (ref 150–400)
RBC: 4.42 MIL/uL (ref 3.87–5.11)
RDW: 13.6 % (ref 11.5–15.5)
WBC: 6.2 10*3/uL (ref 4.0–10.5)
nRBC: 0 % (ref 0.0–0.2)

## 2019-06-22 LAB — COMPREHENSIVE METABOLIC PANEL
ALT: 55 U/L — ABNORMAL HIGH (ref 0–44)
AST: 44 U/L — ABNORMAL HIGH (ref 15–41)
Albumin: 3.3 g/dL — ABNORMAL LOW (ref 3.5–5.0)
Alkaline Phosphatase: 61 U/L (ref 38–126)
Anion gap: 11 (ref 5–15)
BUN: 10 mg/dL (ref 6–20)
CO2: 22 mmol/L (ref 22–32)
Calcium: 8.4 mg/dL — ABNORMAL LOW (ref 8.9–10.3)
Chloride: 103 mmol/L (ref 98–111)
Creatinine, Ser: 0.74 mg/dL (ref 0.44–1.00)
GFR calc Af Amer: >60 mL/min (ref >60)
GFR calc non Af Amer: >60 mL/min (ref >60)
Glucose, Bld: 100 mg/dL — ABNORMAL HIGH (ref 70–99)
Potassium: 3.7 mmol/L (ref 3.5–5.1)
Sodium: 136 mmol/L (ref 135–145)
Total Bilirubin: 1.2 mg/dL (ref 0.3–1.2)
Total Protein: 6.3 g/dL — ABNORMAL LOW (ref 6.5–8.1)

## 2019-06-22 LAB — MAGNESIUM: Magnesium: 2.3 mg/dL (ref 1.7–2.4)

## 2019-06-22 LAB — PHOSPHORUS: Phosphorus: 2.6 mg/dL (ref 2.5–4.6)

## 2019-06-22 MED ORDER — SCOPOLAMINE 1 MG/3DAYS TD PT72: 1.0000 | MEDICATED_PATCH | TRANSDERMAL | 12 refills | Status: DC

## 2019-06-22 MED ORDER — NICOTINE 21 MG/24HR TD PT24: 21.0000 mg | MEDICATED_PATCH | Freq: Every day | TRANSDERMAL | 0 refills | Status: DC

## 2019-06-22 MED ORDER — MECLIZINE HCL 25 MG PO TABS: 25.0000 mg | ORAL_TABLET | Freq: Three times a day (TID) | ORAL | 0 refills | Status: DC | PRN

## 2019-06-22 MED ORDER — ONDANSETRON HCL 4 MG PO TABS: 4.0000 mg | ORAL_TABLET | Freq: Four times a day (QID) | ORAL | 0 refills | Status: DC | PRN

## 2019-06-22 MED ORDER — PANTOPRAZOLE SODIUM 40 MG PO TBEC: 40.0000 mg | DELAYED_RELEASE_TABLET | Freq: Every day | ORAL | 0 refills | Status: DC

## 2019-06-22 MED ORDER — POTASSIUM CHLORIDE 10 MEQ/100ML IV SOLN
INTRAVENOUS | Status: AC
  Administered 2019-06-22: 10 meq
  Filled 2019-06-22: qty 100

## 2019-06-22 MED ORDER — POLYETHYLENE GLYCOL 3350 17 G PO PACK: 17.0000 g | PACK | Freq: Every day | ORAL | 0 refills | Status: AC

## 2019-06-22 NOTE — Progress Notes (Signed)
This RN went in to give the patient her discharge instructions. The patient had no immediate concerns or questions about her discharge instructions. When the NT and I were in the room together and the patient said she she was only missing 2 more nasal sprays. This RN went into the room for a second time without the NT and the patient said her medications was taken from her in the ED. The patient told this RN that she was missing about 30 or more 15 mg oxycodone tablets along with her other two nasal sprays. Her scheduled Flonase was returned to her from her patient specific bin. This RN checked the shadow chart and called pharmacy to see if any medications had been turned in and nothing was found. Additionally for the third time the patient then said she was missing some upper dentures. The room was searched again and no dentures were found. Our department director was notified and the and a safety zone portal will be completed. The patient was refusing to leave the room and said " I'm not leaving this room until I find my teeth." Security was called and the patient was escorted downstairs in a wheelchair via our NT and security.

## 2019-06-22 NOTE — Progress Notes (Addendum)
[facility] Gastroenterology Progress Note  Kristen Ramos 59 y.o. 1960/08/28  CC: Nausea and vomiting, improved  Subjective: Patient states she has not had any nausea or vomiting today.  She has been tolerating a diet.  She denies any abdominal pain.  She states she is interested in being discharged today.  Review of Systems  Constitutional: Negative for chills and fever.  Gastrointestinal: Negative for abdominal pain, blood in stool, heartburn, melena, nausea and vomiting.    Objective: Vital signs: Vitals:   06/21/19 2055 06/22/19 0547  BP: 123/73 133/61  Pulse: 81 71  Resp: 20 (!) 24  Temp: 98.3 F (36.8 C) 98.4 F (36.9 C)  SpO2: 94% 92%    Physical Exam  Constitutional: She is oriented to person, place, and time. She appears well-developed and well-nourished. No distress.  Cardiovascular: Normal rate, regular rhythm and normal heart sounds.  Pulmonary/Chest: Effort normal and breath sounds normal. No respiratory distress.  Abdominal: Soft. Bowel sounds are normal. She exhibits no distension and no mass. There is no abdominal tenderness. There is no rebound and no guarding.  Musculoskeletal:        General: No deformity or edema.  Neurological: She is alert and oriented to person, place, and time.  Skin: Skin is warm and dry.  Psychiatric: She has a normal mood and affect. Her behavior is normal.    Lab Results: Recent Labs    06/21/19 0556 06/22/19 0524  NA 133* 136  K 3.6 3.7  CL 101 103  CO2 21* 22  GLUCOSE 122* 100*  BUN 10 10  CREATININE 0.69 0.74  CALCIUM 8.9 8.4*  MG 2.1 2.3  PHOS 3.2 2.6   Recent Labs    06/21/19 0556 06/22/19 0524  AST 43* 44*  ALT 51* 55*  ALKPHOS 77 61  BILITOT 1.5* 1.2  PROT 7.5 6.3*  ALBUMIN 3.8 3.3*   Recent Labs    06/21/19 0556 06/22/19 0524  WBC 8.5 6.2  NEUTROABS 6.2 3.3  HGB 15.9* 13.0  HCT 46.2* 39.1  MCV 87.2 88.5  PLT 291 234      Assessment: Intractable nausea and vomiting, negative work-up to  include CT of abdomen/pelvis, MRI of brain, HIDA, gastric emptying study, EGD with biopsies.  Plan: Advance  diet as tolerated.  If patient is able to tolerate liquids and oral intake, okay from GI standpoint to discharge home.  Recommend discontinuation of Reglan at time of discharge.  Recommend Protonix 40 mg p.o. daily as an outpatient.  If scopolamine patch helps with symptoms, OK to use as an outpatient.  Recommend outpatient ENT consultation to assess for vestibular dysfunction.  [facility] will sign off.  Please contact us if we can be of any further assistance during this hospital stay.  Edrick Kins 06/22/2019, 12:37 PM  Questions please call [phone]

## 2019-06-22 NOTE — TOC Transition Note (Signed)
Transition of Care [facility]) - CM/SW Discharge Note   Patient Details  Name: Kristen Ramos MRN: [id number] Date of Birth: [birth year]  [facility] CM/SW Contact:  Bartholome Bill, RN Phone Number: 06/22/2019, 2:18 PM   Clinical Narrative:    Pt from home with children. Pt offered choice for home health services and home health was declined at this time. Pt states that someone is home with her 24hrs a day. Pt requesting RW. Orders received and [employer] [facility] to deliver RW to room.     Barriers to Discharge: No Barriers Identified  Discharge Plan and Services                DME Arranged: Walker rolling DME Agency: [employer] Date DME Agency Contacted: 06/22/19 Time DME Agency Contacted: [phone] Representative spoke with at DME Agency: Zack            Social Determinants of Health (SDOH) Interventions     Readmission Risk Interventions No flowsheet data found.

## 2019-06-22 NOTE — Discharge Summary (Signed)
Physician Discharge Summary  Kristen Ramos RUE:[id number] DOB: 01/27/1961 DOA: 06/16/2019  PCP: Kristen Lanius, MD  Admit date: 06/16/2019 Discharge date: 06/22/2019  Admitted From: Home Disposition: Home Health PT/OT  Recommendations for Outpatient Follow-up:  1. Follow up with PCP in 1-2 weeks 2. Follow up with Gastroenterology Dr. Marca Ancona within 1-2 weeks 3. Follow up with ENT Dr. Encarnacion Slates within 1-2 weeks  4. Follow-up with Gastroenterology and have GI further work-up abnormal LFTs given they are mildly elevated 5. Please obtain BMP/CBC in one week 6. Please follow up on the following pending results:  Home Health: YES but refused   Equipment/Devices: RW   Discharge Condition: Stable  CODE STATUS: FULL CODE Diet recommendation: Soft Heart Healthy Diet   Brief/Interim Summary: HPI per Dr. Sanda Klein on 06/16/2019  [name] a 59 y.o.femalewith medical history significantof rheumatoid arthritis, chronic back pain, cyclical vomiting who is coming to the emergency department due to multiple episodes of nausea and emesis since yesterday.The symptoms are typical of her cyclical vomiting flares. She denies fever, chills, night sweats, hematemesis, diarrhea, melena or hematochezia. She denies dyspnea, chest pain, palpitations, dizziness, diaphoresis, PND, orthopnea or pitting edema of the lower extremities. No dysuria, frequency or materia. Denies polyuria, polydipsia, polyphagia or blurred vision.  ED Course:Initial vital signs temperature 97.5 F, pulse 71, respiration of 13, blood pressure 130/85 mmHg and O2 sat 93% on room air. The patient received a 1000 mL NS bolus, 8 mg of morphine IVP, 10 mg of metoclopramide IVP and 0.5 mg lorazepam IV.  CBC was normal. Lipase was normal. CMP shows a glucose of 129 mg/dL, all other values are within normal range. Three-way abdomen does not show any bowel dilatation, there is occasional air-fluid levels noted. There is questionable  enteritis or ileus. Bowel obstruction was felt to be less likely. Please see images and full radiology report for further detail.  **Interim History  She continued to have intractable nausea or vomiting spite being on scheduled Reglan and as needed Zofran.  Because of this gastroenterology was consulted for further evaluation and recommendations and they are recommending a HIDA scan and head CT scan without contrast to rule out mass lesions or other serious causes of dizziness concurrent nausea and vomiting.    06/18/19 She continues to endorse diffuse abdominal pain but gastroenterology feels this may be secondary to chronic opioid usage.  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  GI recommended continuing as needed antiemetics and continue Protonix need added Carafate.  GI was in agreement with the scopolamine patch the patient continues to be extremely upset at the reason why she continues to have nausea vomiting and believes it secondary to her hiatal hernia however her hiatal hernia was very small per my discussion with Dr. Levora Angel.  06/19/19 She continues to be nauseous with abdominal pain and vomiting so GI is ordered her a CT abdomen and pelvis.  She acutely became confused this afternoon per nursing and had a metabolic acidosis so she was started on sodium bicarbonate drip and an MRI was done.  Because of her confusion she also had an ABG.  MRI was unrevealing.  Patient did have some dizziness when she turns her head to the side with question for BPPV.  06/20/19 Gastroenterology has now readded metoclopramide.  She continues to be nauseous vomit and have abdominal pain but states her dizziness is improved.  06/21/19 She is improving and is not as nauseous today states that she just got some nausea medication.  She did vomit last night and is having  formed bowel movements.  GI recommends continuing current diet as the patient does not want her diet advanced currently and recommending starting possibly with dry crackers and advancing diet as tolerated.  GI recommends continuing IV lactated Ringer's at 125 mils per hour until patient is adequately able to tolerate an oral diet.  She is status post Reglan now.  GI recommends continue supportive care at this time with antiemetics as below.  06/22/2019 today patient is doing better and able to tolerate her diet without issues and is no longer nauseous, vomiting or complaining of abdominal pain.  Her dizziness is also improved.  GI recommending sending the patient on scopolamine patch and stopping her metoclopramide.  She will need outpatient ENT evaluation and the number was given to [facility].  She will need to follow-up with her primary gastroenterologist for further adjuvant and evaluation and she is stable to be discharged at this time she is tolerating her diet well without issues and not vomiting.   Discharge Diagnoses:  Principal Problem:   Intractable cyclical vomiting Active Problems:   Rheumatoid arteritis (HCC)  Intractable Cyclical Nausea and Vomiting Associated with abdominal pain, unclear etiology; continued vomiting and dry heaving occasionally, improved significantly -Admit to Med/Surge . -Initially kept n.p.o. but now GI recommending a soft diet; continues to be nauseous and having abdominal pain and vomiting but patient is not eating very much and does not want her diet advanced so she is just been drinking clears -Changed IV fluids to sodium bicarbonate given her metabolic acidosis but since gastroenterology is changing her to lactated Ringer's at 125 mL/h to avoid dehydration we will stop the sodium bicarb and provide her sodium bicarbonate tablets; her acidosis improved -D/G Abdomen showed "No bowel dilatation. Occasional air-fluid levels noted. Question a degree of  enteritis or ileus. Bowel obstruction felt to be less likely. No free air. Lungs clear." -C/w Analgesics as needed with Judicious use of Narcotics -C/w Antiemetics with Ondansetron 4 mg po/IV q6hprn Nausea and  -Was on metoclopramide 10 mg IV q6h however now the metoclopramide has been stopped gastroenterology has now reinitiated this at 10 mg IV every 8 hours scheduled for 3 doses and these doses have been given -Because had no improvement Gastroenterology was consulted for further evaluation recommendations and recommending a HIDA scan today as well as a head CT scan to rule out mass lesions or other serious causes with dizziness with concurrent nausea or vomiting -HIDA showed that the cyst in the common bile duct were patent however no EF was done as patient was unable to drink the Ensure portion of the to calculate the ejection fraction -Head CT done and showed no acute intracranial hemorrhage, mass-effect or evidence of acute infarction -Gastroenterology recommend continue supportive care as well as antiemetics and they were in agreement with the Scopolamine patch that I have ordered -They also recommend a trial of MiraLAX to see if constipation is contributing to her symptoms and recommending decreasing/limiting narcotic usage -Continue with IV PPI with Pantoprazole 40 mg in the a.m. added Carafate -We will monitor patient's clinical response to intervention and follow up on gastroenterology recommendations -Check UDS and U/A and still pending essentially unremarkable except that she had positive opiates in her UDS -Gastroenterology also checked CT abdomen pelvis with contrast and showed "Mild diffuse thickened appearance of the colon,  likely related to underdistention. Colitis is less likely. Clinical correlation is recommended. No bowel obstruction. Normal appendix. Colonic diverticulosis. Aortic Atherosclerosis." -Continue to monitor carefully and work-up for her vestibular issues as below and  have ordered an MRI today which was negative -I have asked PT to do vestibular testing with patient and she was started on meclizine and will continue; PT recommending home health currently -Gastroenterology recommending continue meclizine 25 mg p.o. 3 times daily as needed, Zofran 4 mg IV 6 hours as needed, pantoprazole 40 m IV daily, scopolamine transdermal patch every 72 hours for her nausea and vomiting -C/w Amitriptyline started by GI -Patient is also on Carafate but will change to suspension at 1 g 3 times daily with meals and at bedtime -Appreciate further care per Gastroenterology and GI recommends encourage the patient to eat and possibly starting with dry crackers and advancing the diet as tolerated as well as continuing lactated Ringer's at 125 MLS per hour until she can tolerate oral diet adequately and they recommend continuing supportive care -Patient now improved and is able to tolerate her diet.  We will discharge her on meclizine as well as her as needed ondansetron and continue scopolamine patch and PPI daily -She tolerated her diet without issues  High Anion Gap Metabolic Acidosis Lactic Acidosis -Clean the setting of nausea and vomiting -Patient's CO2 was 16, anion gap was 18, chloride level was 102; her CO2 is now  22, anion gap is 11, and chloride levels  103 -Fluids changed to sodium bicarbonate at 75 MLS per hour yesterday and have now been stopped given that she has been started on lactated Ringer's at 125 mL's per hour by gastroenterology; normal saline 500 mL bolus 4 days ago -Continue with p.o. sodium bicarbonate tablets 650 mg p.o. 3 times daily -Continue to Monitor and Trend -Repeat CMP in AM   Rheumatoid Arteritis (HCC) -Hold Methotrexate and resume at discharge at the discretion of the rheumatologist -Judicious use of opiate pain medications -Continue acetaminophen  Obesity -Estimated body mass index is 30.41 kg/m as calculated from the following:   Height  as of this encounter: 5\' 8"  (1.727 m).   Weight as of this encounter: 90.7 kg. -Weight Loss and Dietary Counseling given   Hyperglycemia in a patient with a history of prediabetes -Check HbA1c in the AM as last hemoglobin A1c was 5.9 almost a year ago -Blood sugars have been ranging from 87-182 on daily CMP's'; this a.m. it was 122 on the CMP -If necessary will place on sensitive NovoLog/scale insulin  Tobacco Abuse -Smoking cessation counseling given -Continue nicotine 21 mg transdermally every 24 hours daily  Dizziness, improved  -Head CT being obtained by Gastroenterology was negative and showed no acute intracranial hemorrhage, mass effect, or evidence of acute infarction -Will try Scopolamine Patch patient still continues to be dizzy with movement to the right -Will also obtain a PT Vestibular Consult and they are recommending home health PT -MRI done and as below -Try Meclizine we will continue 25 mg p.o. 3 times daily as needed for dizziness nausea -She was started on sodium bicarbonate drip yesterday but this did not been stopped as gastroenterology has placed her on lactated Ringer's at 125 mL's per hour -She may have something vestibular going on and will test this to see if this is contributing to her dizziness which is then in turn causing her nausea and vomiting  Acute encephalopathy and confusion, improving  -Unclear etiology but ordered an ABG which showed metabolic  alkalosis -MRI done and showed "Partial study demonstrate no acute infarction. -Continue with Delirium precautions -Obtain blood cultures x2 and showed NGTD at 3days  -ABG done and showed pH of 7.454, PCO2 of 30.4, PO2 of 78.6, ABG O2 saturation of 94.4% with a bicarbonate level of 21.0 and FiO2 of 21.0 -Continue to monitor and her confusion is improved but she is more withdrawn and continues to have some abdominal pain, nausea and vomiting  Hypophosphatemia -Patient's Phos level was 2.3 and today is   2.6 -Continue monitor and replete as necessary -Repeat phosphorus level in a.m.  Hypokalemia -Patient's potassium this morning was 3.7 -Continue to monitor and replete as necessary -Repeat CMP in a.m.   Erythrocytosis -Patient is a smoker and likely has some component of dehydration -Gastroenterology has restarted her IV fluids at 125 an hour of lactated Ringer's -Her crit went from 15.5/45.1 is now 15.9/46.2 -Continue monitor and trend and repeat CBC in a.m.  Abnormal LFTs -Patient's AST was 34 yesterday and worsened to 44 and ALT went from 39 and worsened to 55 -Continue to monitor and trend hepatic function panel -If continues to worsen or not improving will obtain a right upper quadrant ultrasound as well as an acute hepatitis panel but this can be done outpatient setting given that this is a mild elevation -Repeat CMP in the a.m.  Hyperbilirubinemia  -Mild and slightly worsening  -Patient's T bili went from 0.6 and is now 1.5 and is now improved to 1.2 -Continue to monitor and trend hepatic function panel -Repeat CMP in a.m.  Discharge Instructions  Discharge Instructions    Call MD for:  difficulty breathing, headache or visual disturbances   Complete by: As directed    Call MD for:  extreme fatigue   Complete by: As directed    Call MD for:  hives   Complete by: As directed    Call MD for:  persistant dizziness or light-headedness   Complete by: As directed    Call MD for:  persistant nausea and vomiting   Complete by: As directed    Call MD for:  redness, tenderness, or signs of infection (pain, swelling, redness, odor or green/yellow discharge around incision site)   Complete by: As directed    Call MD for:  severe uncontrolled pain   Complete by: As directed    Call MD for:  temperature >100.4   Complete by: As directed    Diet - low sodium heart healthy   Complete by: As directed    Discharge instructions   Complete by: As directed    You were cared  for by a hospitalist during your hospital stay. If you have any questions about your discharge medications or the care you received while you were in the hospital after you are discharged, you can call the unit and ask to speak with the hospitalist on call if the hospitalist that took care of you is not available. Once you are discharged, your primary care physician will handle any further medical issues. Please note that NO REFILLS for any discharge medications will be authorized once you are discharged, as it is imperative that you return to your primary care physician (or establish a relationship with a primary care physician if you do not have one) for your aftercare needs so that they can reassess your need for medications and monitor your lab values.  Follow up with PCP, Gastroenterology, and ENT in the outpatient setting. Take all medications as prescribed. If symptoms  change or worsen please return to the ED for evaluation   Increase activity slowly   Complete by: As directed      Allergies as of 06/22/2019      Reactions   Bee Venom Swelling      Medication List    STOP taking these medications   ondansetron 4 MG disintegrating tablet Commonly known as: ZOFRAN-ODT   ondansetron 8 MG disintegrating tablet Commonly known as: ZOFRAN-ODT     TAKE these medications   amitriptyline 25 MG tablet Commonly known as: ELAVIL Take 25 mg by mouth daily.   cetirizine 10 MG tablet Commonly known as: ZYRTEC Take 10 mg by mouth daily.   famotidine 40 MG tablet Commonly known as: PEPCID Take 40 mg by mouth 2 (two) times daily.   fluticasone 50 MCG/ACT nasal spray Commonly known as: FLONASE Place 2 sprays into both nostrils daily.   folic acid 1 MG tablet Commonly known as: FOLVITE Take 1 mg by mouth daily.   Horizant 600 MG Tbcr Generic drug: Gabapentin Enacarbil Take 1 tablet by mouth 2 (two) times daily.   meclizine 25 MG tablet Commonly known as: ANTIVERT Take 1 tablet (25  mg total) by mouth 3 (three) times daily as needed for dizziness or nausea.   methotrexate 2.5 MG tablet Commonly known as: RHEUMATREX Take 15 mg by mouth once a week.   nicotine 21 mg/24hr patch Commonly known as: NICODERM CQ - dosed in mg/24 hours Place 1 patch (21 mg total) onto the skin daily. Start taking on: June 23, 2019   ondansetron 4 MG tablet Commonly known as: ZOFRAN Take 1 tablet (4 mg total) by mouth every 6 (six) hours as needed for nausea.   oxyCODONE 15 MG immediate release tablet Commonly known as: ROXICODONE Take 15 mg by mouth 4 (four) times daily as needed for pain.   pantoprazole 40 MG tablet Commonly known as: PROTONIX Take 1 tablet (40 mg total) by mouth daily.   polyethylene glycol 17 g packet Commonly known as: MIRALAX / GLYCOLAX Take 17 g by mouth daily. Start taking on: June 23, 2019   scopolamine 1 MG/3DAYS Commonly known as: TRANSDERM-SCOP Place 1 patch (1.5 mg total) onto the skin every 3 (three) days. Start taking on: June 24, 2019   sucralfate 1 GM/10ML suspension Commonly known as: CARAFATE Take 1 g by mouth 4 (four) times daily.   tiZANidine 4 MG tablet Commonly known as: ZANAFLEX Take 4 mg by mouth 2 (two) times daily as needed for muscle spasms.      Follow-up Information    Kristen Lanius, MD. Call.   Specialty: Rheumatology Why: Follow up within 1-2 weeks Contact information: [address] [place] [address] [phone]        Kerin Salen, MD. Call.   Specialty: Gastroenterology Why: Follow up withn 1-2 weeks Contact information: [address] [place] [address] [phone]        Graylin Shiver, MD. Call.   Specialty: Otolaryngology Why: Follow up for Further Vestibular Assessment due to Dizziness Contact information: [address] [place] [address] [phone]          Allergies  Allergen Reactions  . Bee Venom Swelling     Consultations:  Gastroenterology  Procedures/Studies: CT HEAD WO CONTRAST  Result Date: 06/17/2019 CLINICAL DATA:  Dizziness EXAM: CT HEAD WITHOUT CONTRAST TECHNIQUE: Contiguous axial images were obtained from the base of the skull through the vertex without  intravenous contrast. COMPARISON:  None. FINDINGS: Brain: There is no acute intracranial hemorrhage, mass effect, or edema. Gray-white differentiation is preserved. There is no extra-axial fluid collection. Ventricles and sulci are within normal limits in size and configuration. Vascular: No hyperdense vessel or unexpected calcification. Skull: Calvarium is unremarkable. Sinuses/Orbits: Patchy ethmoid mucosal thickening. Orbits are unremarkable. Other: Mastoid air cells are clear. IMPRESSION: No acute intracranial hemorrhage, mass effect, or evidence of acute infarction. Electronically Signed   By: Guadlupe Spanish M.D.   On: 06/17/2019 14:44   MR BRAIN WO CONTRAST  Result Date: 06/19/2019 CLINICAL DATA:  Dizziness EXAM: MRI HEAD WITHOUT CONTRAST TECHNIQUE: Multiplanar, multiecho pulse sequences of the brain and surrounding structures were obtained without intravenous contrast. COMPARISON:  None. FINDINGS: DWI, axial T2, and sagittal T1 sequences were obtained. Patient could not tolerate remainder of the study. Brain: There is no acute infarction. Ventricles and sulci are normal in size and configuration. There is no mass lesion or mass effect. No hydrocephalus. Vascular: Major vessel flow voids at the skull base are preserved within limitation of motion artifact. Skull and upper cervical spine: Normal marrow signal is preserved. Sinuses/Orbits: Patchy left posterior ethmoid mucosal thickening. Orbits are unremarkable. Other: Mastoid air cells are clear. IMPRESSION: Partial study demonstrate no acute infarction. Electronically Signed   By: Guadlupe Spanish M.D.   On: 06/19/2019 16:28   NM Hepatobiliary Liver Func  Result Date: 06/17/2019 CLINICAL  DATA:  Nausea, vomiting, abdominal pain EXAM: NUCLEAR MEDICINE HEPATOBILIARY IMAGING TECHNIQUE: Sequential images of the abdomen were obtained out to 60 minutes following intravenous administration of radiopharmaceutical. RADIOPHARMACEUTICALS:  5.5 mCi Tc-61m  Choletec IV COMPARISON:  None. FINDINGS: Prompt uptake and biliary excretion of activity by the liver is seen. Gallbladder activity is visualized at 20 minutes, consistent with patency of cystic duct. Biliary activity passes into small bowel, visualized at 25 minutes, consistent with patent common bile duct. IMPRESSION: The cystic and common bile ducts are patent. Electronically Signed   By: Lauralyn Primes M.D.   On: 06/17/2019 15:14   CT ABDOMEN PELVIS W CONTRAST  Result Date: 06/19/2019 CLINICAL DATA:  59 year old female with abdominal pain, nausea vomiting. EXAM: CT ABDOMEN AND PELVIS WITH CONTRAST TECHNIQUE: Multidetector CT imaging of the abdomen and pelvis was performed using the standard protocol following bolus administration of intravenous contrast. CONTRAST:  OMNIPAQUE IOHEXOL 300 MG/ML  SOLN COMPARISON:  CT abdomen pelvis dated 10/03/2018. FINDINGS: Evaluation of this exam is limited due to respiratory motion artifact. Evaluation is also limited due to streak artifact caused by patient's arms. Lower chest: Minimal bibasilar atelectatic changes. The visualized lung bases are otherwise clear. No intra-abdominal free air or free fluid. Hepatobiliary: The liver is unremarkable. No intrahepatic biliary ductal dilatation. The gallbladder is unremarkable. Mild dilatation of the common bile duct measuring up to 13 mm similar to prior CT. No calcified stone noted in the central CBD. Pancreas: Unremarkable. No pancreatic ductal dilatation or surrounding inflammatory changes. Spleen: Normal in size without focal abnormality. Adrenals/Urinary Tract: The right adrenal gland is unremarkable. Mild thickening of the left adrenal gland similar to prior CT.  There is no hydronephrosis on either side. There is symmetric enhancement and excretion of contrast by both kidneys. The visualized ureters and urinary bladder appear unremarkable. Stomach/Bowel: There are scattered colonic diverticula without active inflammatory changes. Mild diffuse thickened appearance of the colon, likely related to underdistention. Colitis is less likely. Clinical correlation is recommended. There is no bowel obstruction. The appendix is normal. Vascular/Lymphatic: Mild aortoiliac  atherosclerotic disease. The IVC is unremarkable. No portal venous gas. There is no adenopathy. Reproductive: Hysterectomy. No pelvic mass. Other: Small fat containing umbilical hernia. Musculoskeletal: Degenerative changes of the spine. Lower lumbar posterior fusion. No acute osseous pathology. IMPRESSION: 1. Mild diffuse thickened appearance of the colon, likely related to underdistention. Colitis is less likely. Clinical correlation is recommended. No bowel obstruction. Normal appendix. 2. Colonic diverticulosis. 3. Aortic Atherosclerosis (ICD10-I70.0). Electronically Signed   By: Elgie Collard M.D.   On: 06/19/2019 15:41   DG ABD ACUTE 2+V W 1V CHEST  Result Date: 06/16/2019 CLINICAL DATA:  Abdominal distension and vomiting EXAM: DG ABDOMEN ACUTE W/ 1V CHEST COMPARISON:  Abdomen series Jul 15, 2018 FINDINGS: PA chest: Lungs are clear. Heart size and pulmonary vascularity are normal. No adenopathy. There is postoperative change in the lower cervical spine region. Supine and left lateral decubitus abdomen: There is moderate stool in the colon. There is no appreciable bowel dilatation. There are scattered air-fluid levels. No free air. There are occasional pelvic calcifications, likely phleboliths. Postoperative change noted in the lower lumbar spine. IMPRESSION: No bowel dilatation. Occasional air-fluid levels noted. Question a degree of enteritis or ileus. Bowel obstruction felt to be less likely. No free air.  Lungs clear. Electronically Signed   By: Bretta Bang III M.D.   On: 06/16/2019 11:34      Subjective: And examined at bedside and she is doing much better and able to tolerate her diet and had no issues of dizziness, nausea, vomiting or abdominal pain.  She felt well and she was stable for discharge and she will need to follow-up with PCP, gastroenterology as well as ENT in outpatient setting and she is agreeable to plan of care she is tolerating her diet without issues.  Discharge Exam: Vitals:   06/21/19 2055 06/22/19 0547  BP: 123/73 133/61  Pulse: 81 71  Resp: 20 (!) 24  Temp: 98.3 F (36.8 C) 98.4 F (36.9 C)  SpO2: 94% 92%   Vitals:   06/21/19 0607 06/21/19 1356 06/21/19 2055 06/22/19 0547  BP: 132/83 (!) 162/117 123/73 133/61  Pulse:  87 81 71  Resp:  16 20 (!) 24  Temp:  97.8 F (36.6 C) 98.3 F (36.8 C) 98.4 F (36.9 C)  TempSrc:  Oral Oral Oral  SpO2:  98% 94% 92%  Weight:      Height:       General: Pt is alert, awake, not in acute distress Cardiovascular: RRR, S1/S2 +, no rubs, no gallops Respiratory: CTA bilaterally, no wheezing, no rhonchi; no labored breathing Abdominal: Soft, nontender, distended second body habitus, bowel sounds + Extremities: Mild edema, no cyanosis  The results of significant diagnostics from this hospitalization (including imaging, microbiology, ancillary and laboratory) are listed below for reference.    Microbiology: Recent Results (from the past 240 hour(s))  SARS CORONAVIRUS 2 (TAT 6-24 HRS) Nasopharyngeal Nasopharyngeal Swab     Status: None   Collection Time: 06/16/19  2:26 PM   Specimen: Nasopharyngeal Swab  Result Value Ref Range Status   SARS Coronavirus 2 NEGATIVE NEGATIVE Final    Comment: (NOTE) SARS-CoV-2 target nucleic acids are NOT DETECTED. The SARS-CoV-2 RNA is generally detectable in upper and lower respiratory specimens during the acute phase of infection. Negative results do not preclude SARS-CoV-2  infection, do not rule out co-infections with other pathogens, and should not be used as the sole basis for treatment or other patient management decisions. Negative results must be combined with clinical  observations, patient history, and epidemiological information. The expected result is Negative. Fact Sheet for Patients: HairSlick.no Fact Sheet for Healthcare Providers: quierodirigir.com This test is not yet approved or cleared by the [place] FDA and  has been authorized for detection and/or diagnosis of SARS-CoV-2 by FDA under an Emergency Use Authorization (EUA). This EUA will remain  in effect (meaning this test can be used) for the duration of the COVID-19 declaration under Section 56 4(b)(1) of the Act, 21 U.S.C. section 360bbb-3(b)(1), unless the authorization is terminated or revoked sooner. Performed at [facility], [address] [place], [place] [address]   Culture, blood (routine x 2)     Status: None (Preliminary result)   Collection Time: 06/19/19  1:36 PM   Specimen: BLOOD  Result Value Ref Range Status   Specimen Description   Final    BLOOD LEFT ARM Performed at [facility], [address], [place], [place] [address]    Special Requests   Final    BOTTLES DRAWN AEROBIC AND ANAEROBIC Blood Culture adequate volume Performed at [facility], [address] [place], [place] [address]    Culture   Final    NO GROWTH 3 DAYS Performed at [facility], [address], [place], [place] [address]    Report Status PENDING  Incomplete  Culture, blood (routine x 2)     Status: None (Preliminary result)   Collection Time: 06/19/19  1:42 PM   Specimen: BLOOD LEFT HAND  Result Value Ref Range Status   Specimen Description   Final    BLOOD LEFT HAND Performed at [facility], [address], [place], [place] [address]    Special Requests    Final    BOTTLES DRAWN AEROBIC ONLY Blood Culture adequate volume Performed at [facility], [address] [place], [place] [address]    Culture   Final    NO GROWTH 3 DAYS Performed at [facility], [address], [place], [place] [address]    Report Status PENDING  Incomplete     Labs: BNP (last 3 results) No results for input(s): BNP in the last 8760 hours. Basic Metabolic Panel: Recent Labs  Lab 06/19/19 0539 06/19/19 1323 06/20/19 0554 06/21/19 0556 06/22/19 0524  NA 139 136 135 133* 136  K 3.7 3.3* 3.4* 3.6 3.7  CL 103 102 100 101 103  CO2 18* 16* 21* 21* 22  GLUCOSE 145* 182* 150* 122* 100*  BUN CREATININE 0.69 0.92 0.54 0.69 0.74  CALCIUM 9.3 9.0 9.1 8.9 8.4*  MG 2.0 2.0 2.1 2.1 2.3  PHOS 2.5 2.3* 4.1 3.2 2.6   Liver Function Tests: Recent Labs  Lab 06/19/19 0539 06/19/19 1323 06/20/19 0554 06/21/19 0556 06/22/19 0524  AST 33 38 34 43* 44*  ALT 29 <5 39 51* 55*  ALKPHOS 69 81 75 77 61  BILITOT 1.0 1.0 1.1 1.5* 1.2  PROT 8.0 8.2* 7.9 7.5 6.3*  ALBUMIN 4.1 4.2 3.9 3.8 3.3*   Recent Labs  Lab 06/16/19 1103  LIPASE 27   No results for input(s): AMMONIA in the last 168 hours. CBC: Recent Labs  Lab 06/18/19 0539 06/19/19 0539 06/20/19 0554 06/21/19 0556 06/22/19 0524  WBC 5.8 5.6 9.5 8.5 6.2  NEUTROABS 2.9 4.3 7.2 6.2 3.3  HGB 12.4 13.8 15.5* 15.9* 13.0  HCT 37.6 41.5 45.1 46.2* 39.1  MCV 89.3 89.6 85.4 87.2  88.5  PLT 259 252 303 291 234   Cardiac Enzymes: No results for input(s): CKTOTAL, CKMB, CKMBINDEX, TROPONINI in the last 168 hours. BNP: Invalid input(s): POCBNP CBG: No results for input(s): GLUCAP in the last 168 hours. D-Dimer No results for input(s): DDIMER in the last 72 hours. Hgb A1c No results for input(s): HGBA1C in the last 72 hours. Lipid Profile No results for input(s): CHOL, HDL, LDLCALC, TRIG, CHOLHDL, LDLDIRECT in the last 72 hours. Thyroid function studies No results for  input(s): TSH, T4TOTAL, T3FREE, THYROIDAB in the last 72 hours.  Invalid input(s): FREET3 Anemia work up No results for input(s): VITAMINB12, FOLATE, FERRITIN, TIBC, IRON, RETICCTPCT in the last 72 hours. Urinalysis    Component Value Date/Time   COLORURINE YELLOW 06/17/2019 1530   APPEARANCEUR HAZY (A) 06/17/2019 1530   LABSPEC 1.017 06/17/2019 1530   PHURINE 6.0 06/17/2019 1530   GLUCOSEU NEGATIVE 06/17/2019 1530   HGBUR NEGATIVE 06/17/2019 1530   BILIRUBINUR NEGATIVE 06/17/2019 1530   KETONESUR 5 (A) 06/17/2019 1530   PROTEINUR NEGATIVE 06/17/2019 1530   NITRITE NEGATIVE 06/17/2019 1530   LEUKOCYTESUR NEGATIVE 06/17/2019 1530   Sepsis Labs Invalid input(s): PROCALCITONIN,  WBC,  LACTICIDVEN Microbiology Recent Results (from the past 240 hour(s))  SARS CORONAVIRUS 2 (TAT 6-24 HRS) Nasopharyngeal Nasopharyngeal Swab     Status: None   Collection Time: 06/16/19  2:26 PM   Specimen: Nasopharyngeal Swab  Result Value Ref Range Status   SARS Coronavirus 2 NEGATIVE NEGATIVE Final    Comment: (NOTE) SARS-CoV-2 target nucleic acids are NOT DETECTED. The SARS-CoV-2 RNA is generally detectable in upper and lower respiratory specimens during the acute phase of infection. Negative results do not preclude SARS-CoV-2 infection, do not rule out co-infections with other pathogens, and should not be used as the sole basis for treatment or other patient management decisions. Negative results must be combined with clinical observations, patient history, and epidemiological information. The expected result is Negative. Fact Sheet for Patients: HairSlick.no Fact Sheet for Healthcare Providers: quierodirigir.com This test is not yet approved or cleared by the [place] FDA and  has been authorized for detection and/or diagnosis of SARS-CoV-2 by FDA under an Emergency Use Authorization (EUA). This EUA will remain  in effect (meaning  this test can be used) for the duration of the COVID-19 declaration under Section 56 4(b)(1) of the Act, 21 U.S.C. section 360bbb-3(b)(1), unless the authorization is terminated or revoked sooner. Performed at [facility], [address] [place], [place] [address]   Culture, blood (routine x 2)     Status: None (Preliminary result)   Collection Time: 06/19/19  1:36 PM   Specimen: BLOOD  Result Value Ref Range Status   Specimen Description   Final    BLOOD LEFT ARM Performed at [facility], [address] [place], [place] [address]    Special Requests   Final    BOTTLES DRAWN AEROBIC AND ANAEROBIC Blood Culture adequate volume Performed at [facility], [address] [place], [place] [address]    Culture   Final    NO GROWTH 3 DAYS Performed at [facility], [address], [place], [place] [address]    Report Status PENDING  Incomplete  Culture, blood (routine x 2)     Status: None (Preliminary result)   Collection Time: 06/19/19  1:42 PM   Specimen: BLOOD LEFT HAND  Result Value Ref Range Status   Specimen Description   Final  BLOOD LEFT HAND Performed at [facility], [address], [place], [place] [address]    Special Requests   Final    BOTTLES DRAWN AEROBIC ONLY Blood Culture adequate volume Performed at [facility], [address], [place], [place] [address]    Culture   Final    NO GROWTH 3 DAYS Performed at [facility], [address], [place], [place] [address]    Report Status PENDING  Incomplete   Time coordinating discharge: 35 minutes  SIGNED:  Merlene Laughter, DO [facility] 06/22/2019, 1:52 PM Pager is on AMION  If 7PM-7AM, please contact night-coverage [email or web address] Password [email or web address]

## 2019-06-22 NOTE — Care Management Important Message (Signed)
Important Message  Patient Details IM Letter given to Sandford Craze RN Case Manager to present to the Patient Name: Vista Sawatzky MRN: [id number] Date of Birth: 05/14/1960   Medicare Important Message Given:  Yes     Caren Macadam 06/22/2019, 10:36 AM

## 2019-06-24 LAB — CULTURE, BLOOD (ROUTINE X 2)
Culture: NO GROWTH
Culture: NO GROWTH
Special Requests: ADEQUATE
Special Requests: ADEQUATE

## 2019-08-04 ENCOUNTER — Telehealth: Payer: Self-pay | Admitting: General Practice

## 2019-08-04 NOTE — Telephone Encounter (Signed)
Received email regarding financial assistance for patient from [employer]. When I check to verify coverage in demographics, the insurance for this patient was valid.

## 2020-11-16 ENCOUNTER — Emergency Department (HOSPITAL_BASED_OUTPATIENT_CLINIC_OR_DEPARTMENT_OTHER)
Admission: EM | Admit: 2020-11-16 | Discharge: 2020-11-17 | Disposition: A | Discharge status: Home / Self Care | Payer: Medicare (Managed Care) | Attending: Emergency Medicine | Admitting: Emergency Medicine

## 2020-11-16 DIAGNOSIS — R109 Unspecified abdominal pain: Secondary | ICD-10-CM | POA: Insufficient documentation

## 2020-11-16 DIAGNOSIS — Z20822 Contact with and (suspected) exposure to covid-19: Secondary | ICD-10-CM | POA: Diagnosis not present

## 2020-11-16 DIAGNOSIS — R112 Nausea with vomiting, unspecified: Secondary | ICD-10-CM | POA: Insufficient documentation

## 2020-11-16 DIAGNOSIS — E860 Dehydration: Secondary | ICD-10-CM | POA: Insufficient documentation

## 2020-11-16 DIAGNOSIS — F17210 Nicotine dependence, cigarettes, uncomplicated: Secondary | ICD-10-CM | POA: Insufficient documentation

## 2020-11-16 MED ORDER — SODIUM CHLORIDE 0.9 % IV BOLUS
1000.0000 mL | Freq: Once | INTRAVENOUS | Status: AC
  Administered 2020-11-17: 1000 mL via INTRAVENOUS

## 2020-11-16 MED ORDER — ONDANSETRON HCL 4 MG/2ML IJ SOLN
4.0000 mg | Freq: Once | INTRAMUSCULAR | Status: AC
  Administered 2020-11-17: 4 mg via INTRAVENOUS
  Filled 2020-11-16: qty 2

## 2020-11-16 NOTE — ED Triage Notes (Signed)
Brought in by EMS from home for " stomach pain " pt on knees and elbows on bed rocking back and forth refusing to answer questions. Unable to triage completely

## 2020-11-16 NOTE — ED Provider Notes (Signed)
[facility] EMERGENCY DEPARTMENT Provider Note   CSN: [id number] Arrival date & time: 11/16/20  2247     History Chief Complaint  Patient presents with   Abdominal Pain   Level 5 caveat due to patient nonverbal Kristen Ramos is a 60 y.o. female.  The history is provided by the patient. The history is limited by the condition of the patient.  Abdominal Pain    Patient history of rheumatoid arthritis, chronic pain, cyclical vomiting presents with "stomach pain" patient arrives via EMS.  Patient refusing to answer any questions and unable to fully get a history from patient.  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   Intractable nausea and vomiting 02/12/2018   Epigastric abdominal pain 02/12/2018   Acute gastroenteritis 02/12/2018    Past Surgical History:  Procedure Laterality Date   ABDOMINAL HYSTERECTOMY     BACK SURGERY     BIOPSY  04/19/2018   Procedure: BIOPSY;  Surgeon: Kerin Salen, MD;  Location: [facility];  Service: Gastroenterology;;   CESAREAN SECTION     ESOPHAGOGASTRODUODENOSCOPY (EGD) WITH PROPOFOL N/A 04/19/2018   Procedure: ESOPHAGOGASTRODUODENOSCOPY (EGD) WITH PROPOFOL;  Surgeon: Kerin Salen, MD;  Location: [facility];  Service: Gastroenterology;  Laterality: N/A;     OB History   No obstetric history on file.     No family history on file.  Social History   Tobacco Use   Smoking status: Every Day    Types: Cigarettes   Smokeless tobacco: Never  Substance Use Topics   Alcohol use: No   Drug use: No    Home Medications Prior to Admission medications   Medication Sig Start Date End  Date Taking? Authorizing Provider  amitriptyline (ELAVIL) 25 MG tablet Take 25 mg by mouth daily. 05/21/19   [provider]  cetirizine (ZYRTEC) 10 MG tablet Take 10 mg by mouth daily. 05/26/19   [provider]  famotidine (PEPCID) 40 MG tablet Take 40 mg by mouth 2 (two) times daily. 03/31/19   [provider]  fluticasone (FLONASE) 50 MCG/ACT nasal spray Place 2 sprays into both nostrils daily. 05/20/19   [provider]  folic acid (FOLVITE) 1 MG tablet Take 1 mg by mouth daily. 04/01/19   [provider]  HORIZANT 600 MG TBCR Take 1 tablet by mouth 2 (two) times daily. 06/01/19   [provider]  meclizine (ANTIVERT) 25 MG tablet Take 1 tablet (25 mg total) by mouth 3 (three) times daily as needed for dizziness or nausea. 06/22/19   Marguerita Merles Latif, DO  methotrexate (RHEUMATREX) 2.5 MG tablet Take 15 mg by mouth once a week. 03/31/19   [provider]  nicotine (NICODERM CQ - DOSED IN MG/24 HOURS) 21 mg/24hr patch Place 1 patch (21 mg total) onto the skin daily. 06/23/19   Sheikh, Omair Latif, DO  ondansetron (ZOFRAN) 4 MG tablet Take 1 tablet (4 mg total) by mouth every 6 (six) hours as needed for nausea. 06/22/19   Marguerita Merles Latif, DO  oxyCODONE (ROXICODONE) 15 MG immediate release tablet Take 15 mg by mouth 4 (four)  times daily as needed for pain.  02/03/18   [provider]  pantoprazole (PROTONIX) 40 MG tablet Take 1 tablet (40 mg total) by mouth daily. 06/22/19   Sheikh, Kateri Mc Latif, DO  polyethylene glycol (MIRALAX / GLYCOLAX) 17 g packet Take 17 g by mouth daily. 06/23/19   Marguerita Merles Latif, DO  scopolamine (TRANSDERM-SCOP) 1 MG/3DAYS Place 1 patch (1.5 mg total) onto the skin every 3 (three) days. 06/24/19   Marguerita Merles Latif, DO  sucralfate (CARAFATE) 1 GM/10ML suspension Take 1 g by mouth 4 (four) times daily. 05/20/19   [provider]  tiZANidine (ZANAFLEX) 4 MG tablet Take 4 mg by mouth 2 (two) times  daily as needed for muscle spasms. 09/29/18   [provider]    Allergies    Bee venom  Review of Systems   Review of Systems  Unable to perform ROS: Patient nonverbal  Gastrointestinal:  Positive for abdominal pain.   Physical Exam Updated Vital Signs BP (!) 146/89   Pulse 74   Temp 99 F (37.2 C) (Oral)   Resp 18   Wt 96.2 kg   SpO2 99%   BMI 32.23 kg/m   Physical Exam CONSTITUTIONAL: Well developed/well nourished, lying on her right side awake and alert HEAD: Normocephalic/atraumatic EYES: EOMI NECK: supple no meningeal signs CV: S1/S2 noted, no murmurs/rubs/gallops noted LUNGS: Lungs are clear to auscultation bilaterally, no apparent distress ABDOMEN: soft, nontender, no rebound or guarding, bowel sounds noted throughout abdomen NEURO: Pt is awake/alert, she moves all extremities x4.  Patient is able to track me while in the room but refuses to answer any questions.  Patient is able to follow commands EXTREMITIES: pulses normal/equal, full ROM SKIN: warm, color normal PSYCH: Unable to assess ED Results / Procedures / Treatments   Labs (all labs ordered are listed, but only abnormal results are displayed) Labs Reviewed  CBC WITH DIFFERENTIAL/PLATELET - Abnormal; Notable for the following components:      Result Value   Neutro Abs 7.9 (*)    All other components within normal limits  COMPREHENSIVE METABOLIC PANEL - Abnormal; Notable for the following components:   CO2 19 (*)    Glucose, Bld 116 (*)    Total Protein 8.5 (*)    All other components within normal limits  LACTIC ACID, PLASMA - Abnormal; Notable for the following components:   Lactic Acid, Venous 2.0 (*)    All other components within normal limits  CBG MONITORING, ED - Abnormal; Notable for the following components:   Glucose-Capillary 131 (*)    All other components within normal limits  RESP PANEL BY RT-PCR (FLU A&B, COVID) [id number]  LIPASE, BLOOD    EKG EKG  Interpretation  Date/Time:  Thursday November 17 2020 00:46:31 EDT Ventricular Rate:  74 PR Interval:  128 QRS Duration: 88 QT Interval:  416 QTC Calculation: 462 R Axis:   57 Text Interpretation: Sinus rhythm Confirmed by Zadie Rhine ([id number]) on 11/17/2020 12:53:45 AM  Radiology No results found.  Procedures Procedures   Medications Ordered in ED Medications  ondansetron (ZOFRAN) injection 4 mg (has no administration in time range)  sodium chloride 0.9 % bolus 1,000 mL (0 mLs Intravenous Stopped 11/17/20 0100)  ondansetron (ZOFRAN) injection 4 mg (4 mg Intravenous Given 11/17/20 0002)  metoCLOPramide (REGLAN) injection 10 mg (10 mg Intravenous Given 11/17/20 0125)  diphenhydrAMINE (BENADRYL) injection 25 mg (25 mg Intravenous Given 11/17/20 0128)  lactated ringers bolus 1,000 mL (1,000 mLs Intravenous New Bag/Given 11/17/20  0165)    ED Course  I have reviewed the triage vital signs and the nursing notes.  Pertinent labs  results that were available during my care of the patient were reviewed by me and considered in my medical decision making (see chart for details).    MDM Rules/Calculators/A&P                           Patient seen for reportedly having abdominal pain and nausea vomiting.  Patient is not answering questions per nursing or myself. Will start with labs, treat nausea and reassess. Previous records indicate history of cyclical vomiting syndrome 12:54am Patient appears improved.  She is following commands but still refuses to speak during my exam.  We will give IV fluids Labs reveals dehydration 4:43 AM patient now moaning and appears uncomfortable again.  She still only answers in 1 or 2 word responses. I reviewed previous records, this appears similar to previous ER visits.  She will typically have intractable nausea vomiting and episodes of vertigo.  She has had extensive work-up for vertigo including negative MRI brain and neurology work-ups She has  also had  extensive gastroenterology evaluations without a definitive cause found 5:10 AM While in the ED, patient rolled over and urinated on the floor without informing staff Patient currently is in no acute distress. No tachycardia.  No anion gap.  Lactate was mildly elevated but likely due to dehydration.  Patient is not septic appearing.  She is afebrile Review of chart reveals extensive evaluations for intractable vomiting.  At this point given clinical stability patient will be discharged home Patient is more talkative at this time, reports she does not need Zofran at home. Final Clinical Impression(s) / ED Diagnoses Final diagnoses:  Non-intractable vomiting with nausea, unspecified vomiting type  Dehydration    Rx / DC Orders ED Discharge Orders     None        Zadie Rhine, MD 11/17/20 [phone]

## 2020-11-16 NOTE — ED Notes (Addendum)
multiple attempts made to triage pt, unable to verify hx, meds, ect, Primary nurse , charge and MD aware, side rails up , call bell in place, fall bracelet on

## 2020-11-17 LAB — CBC WITH DIFFERENTIAL/PLATELET
Abs Immature Granulocytes: 0.02 10*3/uL (ref 0.00–0.07)
Basophils Absolute: 0 10*3/uL (ref 0.0–0.1)
Basophils Relative: 0 %
Eosinophils Absolute: 0 10*3/uL (ref 0.0–0.5)
Eosinophils Relative: 0 %
HCT: 43.5 % (ref 36.0–46.0)
Hemoglobin: 14.8 g/dL (ref 12.0–15.0)
Immature Granulocytes: 0 %
Lymphocytes Relative: 10 %
Lymphs Abs: 1 10*3/uL (ref 0.7–4.0)
MCH: 30.6 pg (ref 26.0–34.0)
MCHC: 34 g/dL (ref 30.0–36.0)
MCV: 89.9 fL (ref 80.0–100.0)
Monocytes Absolute: 0.7 10*3/uL (ref 0.1–1.0)
Monocytes Relative: 8 %
Neutro Abs: 7.9 10*3/uL — ABNORMAL HIGH (ref 1.7–7.7)
Neutrophils Relative %: 82 %
Platelets: 265 10*3/uL (ref 150–400)
RBC: 4.84 MIL/uL (ref 3.87–5.11)
RDW: 13.1 % (ref 11.5–15.5)
WBC: 9.7 10*3/uL (ref 4.0–10.5)
nRBC: 0 % (ref 0.0–0.2)

## 2020-11-17 LAB — COMPREHENSIVE METABOLIC PANEL
ALT: 21 U/L (ref 0–44)
AST: 28 U/L (ref 15–41)
Albumin: 4.3 g/dL (ref 3.5–5.0)
Alkaline Phosphatase: 114 U/L (ref 38–126)
Anion gap: 12 (ref 5–15)
BUN: 9 mg/dL (ref 6–20)
CO2: 19 mmol/L — ABNORMAL LOW (ref 22–32)
Calcium: 9.1 mg/dL (ref 8.9–10.3)
Chloride: 107 mmol/L (ref 98–111)
Creatinine, Ser: 0.67 mg/dL (ref 0.44–1.00)
GFR, Estimated: >60 mL/min (ref >60)
Glucose, Bld: 116 mg/dL — ABNORMAL HIGH (ref 70–99)
Potassium: 3.9 mmol/L (ref 3.5–5.1)
Sodium: 138 mmol/L (ref 135–145)
Total Bilirubin: 0.6 mg/dL (ref 0.3–1.2)
Total Protein: 8.5 g/dL — ABNORMAL HIGH (ref 6.5–8.1)

## 2020-11-17 LAB — LIPASE, BLOOD: Lipase: 29 U/L (ref 11–51)

## 2020-11-17 LAB — CBG MONITORING, ED: Glucose-Capillary: 131 mg/dL — ABNORMAL HIGH (ref 70–99)

## 2020-11-17 LAB — RESP PANEL BY RT-PCR (FLU A&B, COVID) ARPGX2
Influenza A by PCR: NEGATIVE
Influenza B by PCR: NEGATIVE
SARS Coronavirus 2 by RT PCR: NEGATIVE

## 2020-11-17 LAB — LACTIC ACID, PLASMA: Lactic Acid, Venous: 2 mmol/L (ref 0.5–1.9)

## 2020-11-17 MED ORDER — DIPHENHYDRAMINE HCL 50 MG/ML IJ SOLN
INTRAMUSCULAR | Status: AC
  Administered 2020-11-17: 25 mg via INTRAVENOUS
  Filled 2020-11-17: qty 1

## 2020-11-17 MED ORDER — METOCLOPRAMIDE HCL 5 MG/ML IJ SOLN
INTRAMUSCULAR | Status: AC
  Administered 2020-11-17: 10 mg via INTRAVENOUS
  Filled 2020-11-17: qty 2

## 2020-11-17 MED ORDER — DIPHENHYDRAMINE HCL 50 MG/ML IJ SOLN: 25.0000 mg | Freq: Once | INTRAMUSCULAR | Status: AC

## 2020-11-17 MED ORDER — ONDANSETRON HCL 4 MG/2ML IJ SOLN
4.0000 mg | Freq: Once | INTRAMUSCULAR | Status: AC
  Administered 2020-11-17: 4 mg via INTRAVENOUS
  Filled 2020-11-17: qty 2

## 2020-11-17 MED ORDER — METOCLOPRAMIDE HCL 5 MG/ML IJ SOLN: 10.0000 mg | Freq: Once | INTRAMUSCULAR | Status: AC

## 2020-11-17 MED ORDER — LACTATED RINGERS IV BOLUS
1000.0000 mL | Freq: Once | INTRAVENOUS | Status: AC
  Administered 2020-11-17: 1000 mL via INTRAVENOUS

## 2020-11-17 NOTE — ED Notes (Signed)
Continues to complain with nausea.

## 2020-11-17 NOTE — ED Notes (Signed)
States she's very nauseated which makes it difficult for her to answer simple questions.

## 2020-11-17 NOTE — Discharge Instructions (Addendum)
Please call your gastroenterology doctor today for follow-up

## 2020-11-17 NOTE — ED Notes (Signed)
Checked CBG 131, RN Jamie informed

## 2020-11-17 NOTE — ED Notes (Signed)
Tolerated ginger ale.

## 2020-11-17 NOTE — ED Notes (Signed)
Ambulates without assistance. 

## 2021-01-26 IMAGING — CT CT ABD-PELV W/ CM
2 of 5 series · 16 of 46 positions shown, 18 images · IV contrast (APPLIED)
Comparison: CT abdomen pelvis dated 10/03/2018.

CLINICAL DATA: 59-year-old female with abdominal pain, nausea
vomiting.

EXAM:
CT ABDOMEN AND PELVIS WITH CONTRAST
TECHNIQUE: Multidetector CT imaging of the abdomen and pelvis was performed
using the standard protocol following bolus administration of
intravenous contrast.
CONTRAST:  100mL OMNIPAQUE IOHEXOL 300 MG/ML  SOLN

[Series 2: axial st · axial · 0.75mm/px · z∈[-475,-55]mm · 13 of 96 slices shown, 15 images]
[im 6/96  soft-tissue]
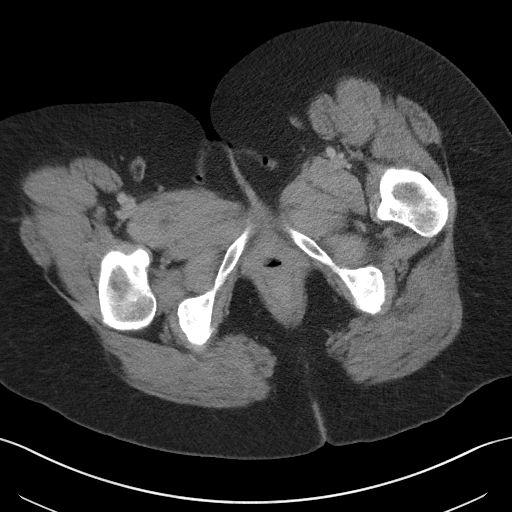
[im 6/96  bone]
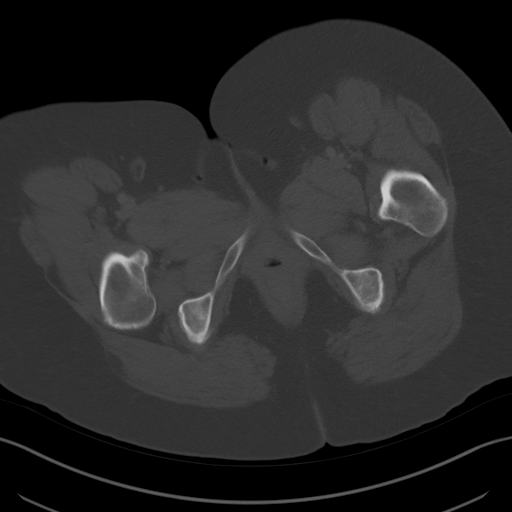
[im 12/96  soft-tissue]
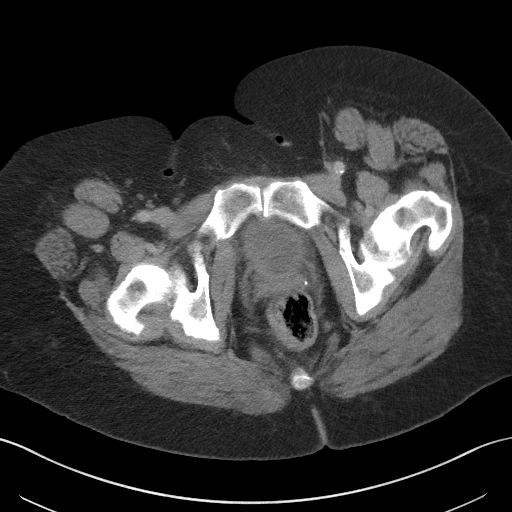
[im 18/96  soft-tissue]
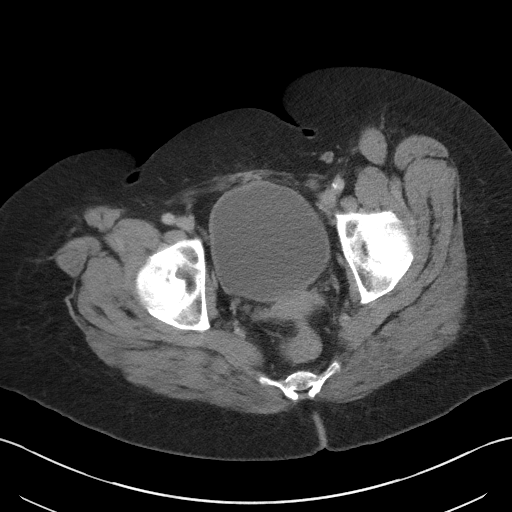
[im 30/96  soft-tissue]
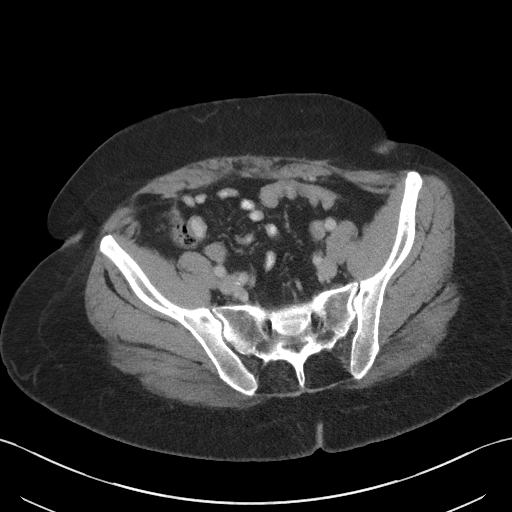
[im 36/96  soft-tissue]
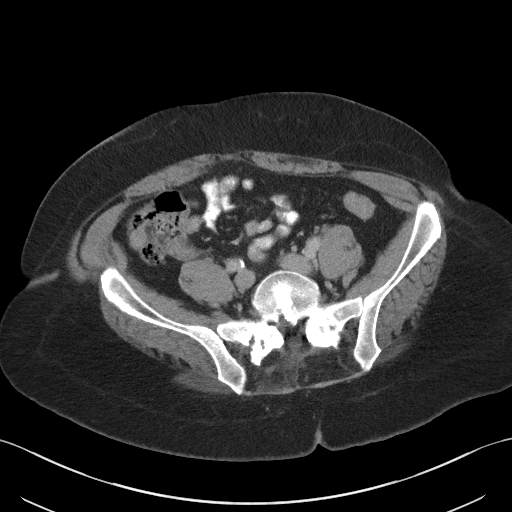
[im 42/96  soft-tissue]
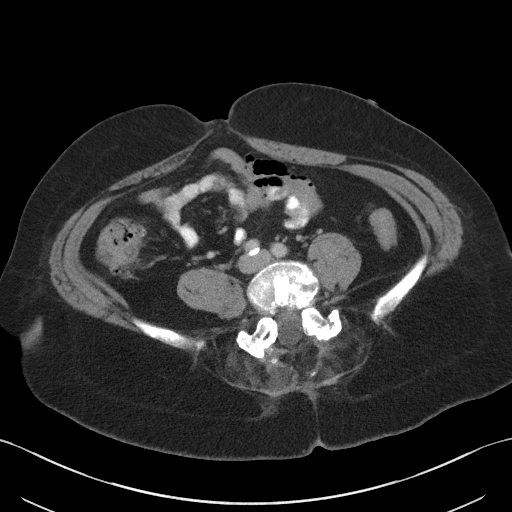
[im 48/96  soft-tissue]
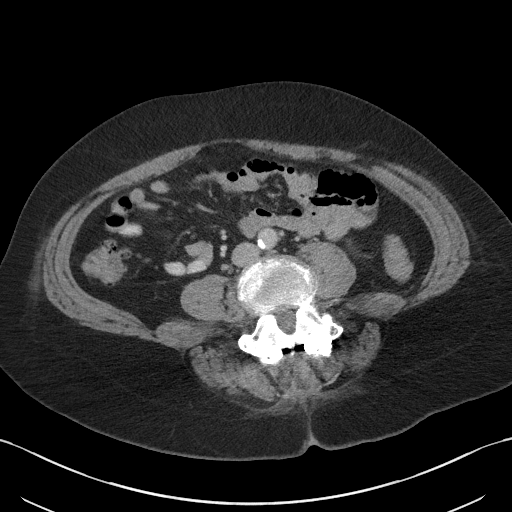
[im 54/96  soft-tissue]
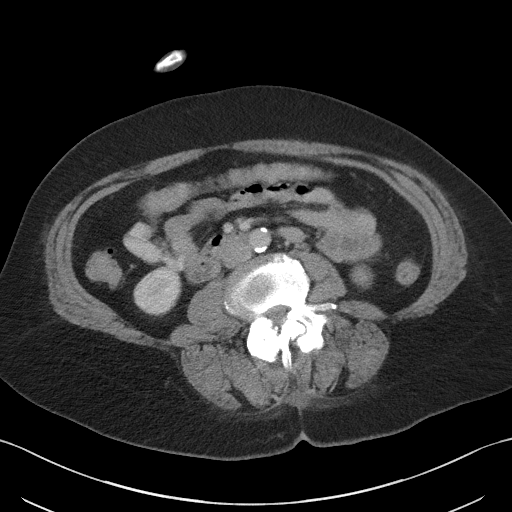
[im 60/96  soft-tissue]
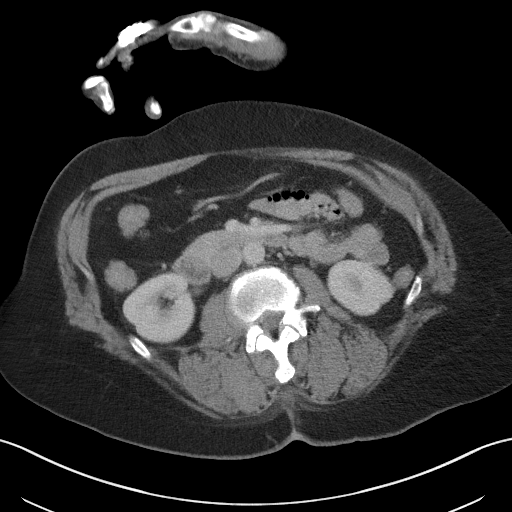
[im 60/96  bone]
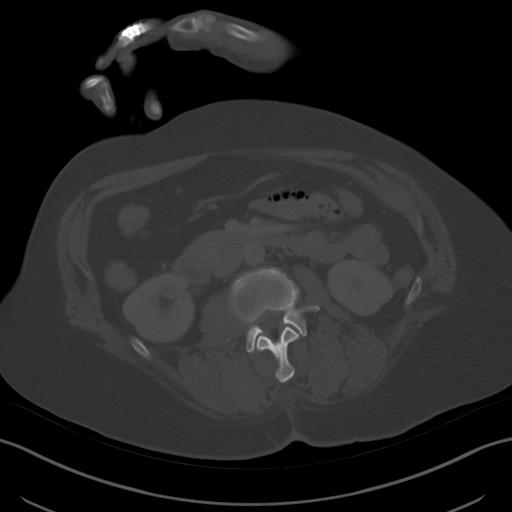
[im 66/96  soft-tissue]
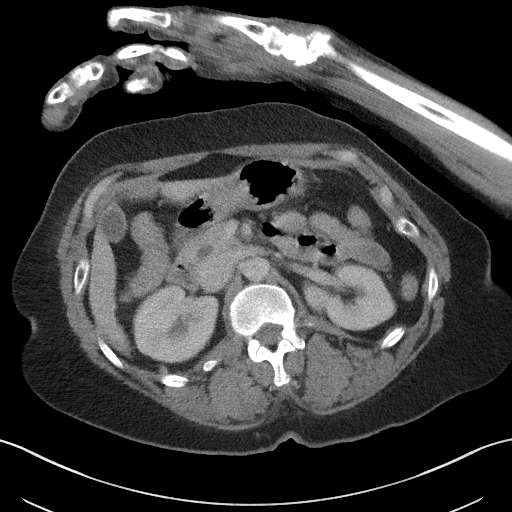
[im 78/96  soft-tissue]
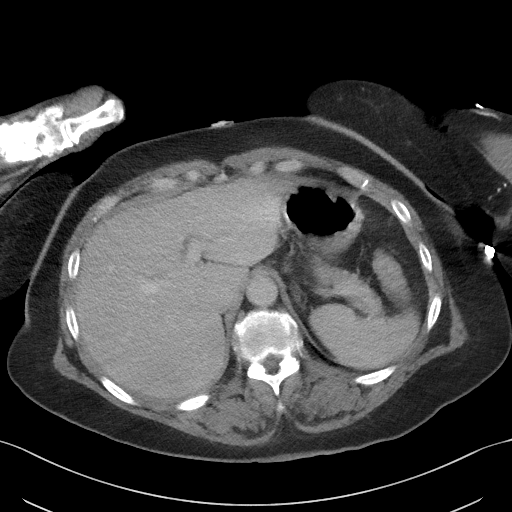
[im 84/96  soft-tissue]
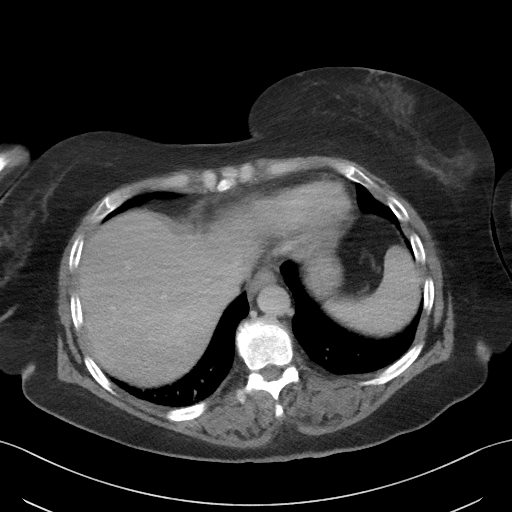
[im 90/96  soft-tissue]
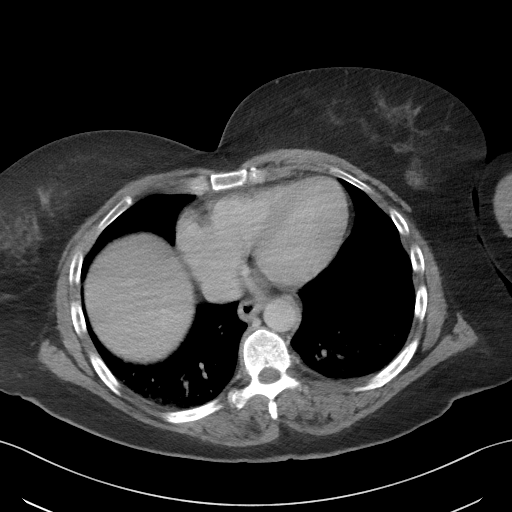

[Series 4: coronal st · coronal · 0.80mm/px · 3 of 95 slices shown]
[im 32/95  soft-tissue]
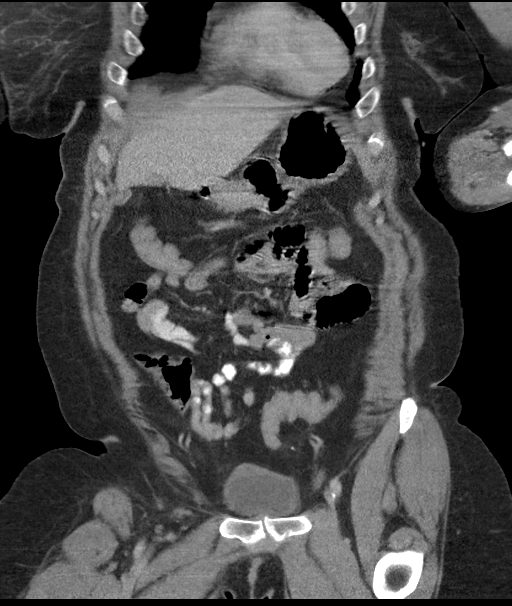
[im 42/95  soft-tissue]
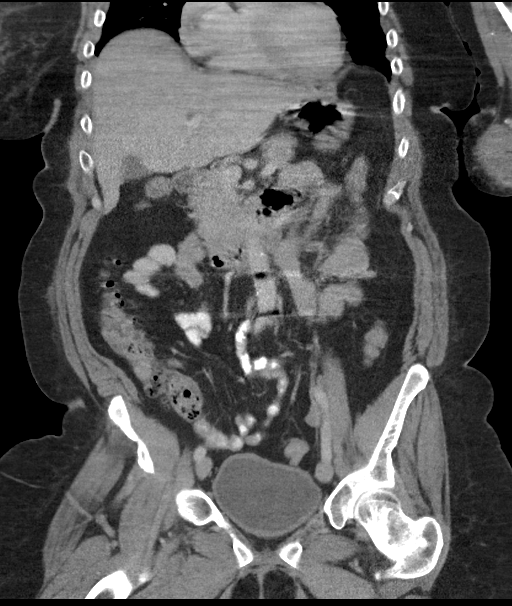
[im 53/95  soft-tissue]
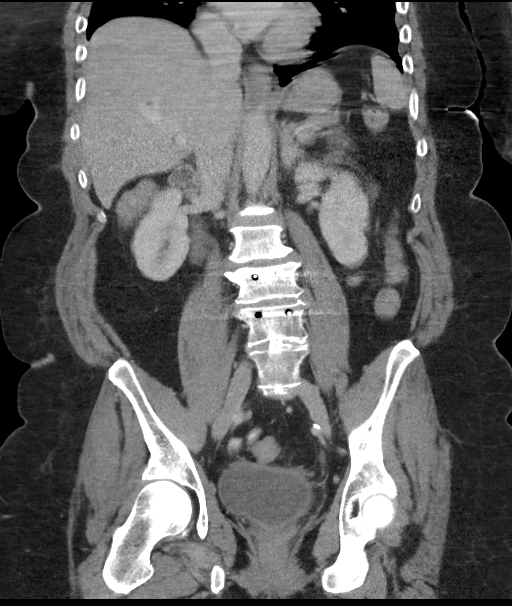

[16 of 46 positions shown; findings below may reference images not displayed]

FINDINGS: Evaluation of this exam is limited due to respiratory motion
artifact. Evaluation is also limited due to streak artifact caused
by patient's arms.

Lower chest: Minimal bibasilar atelectatic changes. The visualized
lung bases are otherwise clear.

No intra-abdominal free air or free fluid.

Hepatobiliary: The liver is unremarkable. No intrahepatic biliary
ductal dilatation. The gallbladder is unremarkable. Mild dilatation
of the common bile duct measuring up to 13 mm similar to prior CT.
No calcified stone noted in the central CBD.

Pancreas: Unremarkable. No pancreatic ductal dilatation or
surrounding inflammatory changes.

Spleen: Normal in size without focal abnormality.

Adrenals/Urinary Tract: The right adrenal gland is unremarkable.
Mild thickening of the left adrenal gland similar to prior CT. There
is no hydronephrosis on either side. There is symmetric enhancement
and excretion of contrast by both kidneys. The visualized ureters
and urinary bladder appear unremarkable.

Stomach/Bowel: There are scattered colonic diverticula without
active inflammatory changes. Mild diffuse thickened appearance of
the colon, likely related to underdistention. Colitis is less
likely. Clinical correlation is recommended. There is no bowel
obstruction. The appendix is normal.

Vascular/Lymphatic: Mild aortoiliac atherosclerotic disease. The IVC
is unremarkable. No portal venous gas. There is no adenopathy.

Reproductive: Hysterectomy. No pelvic mass.

Other: Small fat containing umbilical hernia.

Musculoskeletal: Degenerative changes of the spine. Lower lumbar
posterior fusion. No acute osseous pathology.
IMPRESSION: 1. Mild diffuse thickened appearance of the colon, likely related to
underdistention. Colitis is less likely. Clinical correlation is
recommended. No bowel obstruction. Normal appendix.
2. Colonic diverticulosis.
3. Aortic Atherosclerosis ([id number]).

## 2021-03-25 ENCOUNTER — Encounter (HOSPITAL_COMMUNITY): Payer: Self-pay | Admitting: Emergency Medicine

## 2021-03-25 ENCOUNTER — Other Ambulatory Visit: Payer: Self-pay

## 2021-03-25 ENCOUNTER — Observation Stay (HOSPITAL_COMMUNITY)
Admission: EM | Admit: 2021-03-25 | Discharge: 2021-03-26 | Disposition: A | Discharge status: Home / Self Care | Payer: Medicare (Managed Care) | Attending: Internal Medicine | Admitting: Internal Medicine

## 2021-03-25 DIAGNOSIS — N179 Acute kidney failure, unspecified: Secondary | ICD-10-CM | POA: Insufficient documentation

## 2021-03-25 DIAGNOSIS — R112 Nausea with vomiting, unspecified: Secondary | ICD-10-CM | POA: Diagnosis not present

## 2021-03-25 DIAGNOSIS — Z20822 Contact with and (suspected) exposure to covid-19: Secondary | ICD-10-CM | POA: Diagnosis not present

## 2021-03-25 DIAGNOSIS — R2681 Unsteadiness on feet: Secondary | ICD-10-CM | POA: Insufficient documentation

## 2021-03-25 DIAGNOSIS — R1110 Vomiting, unspecified: Secondary | ICD-10-CM

## 2021-03-25 DIAGNOSIS — J309 Allergic rhinitis, unspecified: Secondary | ICD-10-CM | POA: Diagnosis present

## 2021-03-25 DIAGNOSIS — K219 Gastro-esophageal reflux disease without esophagitis: Secondary | ICD-10-CM

## 2021-03-25 DIAGNOSIS — F17210 Nicotine dependence, cigarettes, uncomplicated: Secondary | ICD-10-CM | POA: Diagnosis not present

## 2021-03-25 DIAGNOSIS — Z79899 Other long term (current) drug therapy: Secondary | ICD-10-CM | POA: Diagnosis not present

## 2021-03-25 DIAGNOSIS — Z720 Tobacco use: Secondary | ICD-10-CM | POA: Diagnosis not present

## 2021-03-25 DIAGNOSIS — R109 Unspecified abdominal pain: Secondary | ICD-10-CM | POA: Diagnosis present

## 2021-03-25 DIAGNOSIS — R1084 Generalized abdominal pain: Secondary | ICD-10-CM | POA: Diagnosis present

## 2021-03-25 HISTORY — DX: Gastro-esophageal reflux disease without esophagitis: K21.9

## 2021-03-25 HISTORY — DX: Allergic rhinitis, unspecified: J30.9

## 2021-03-25 HISTORY — DX: Rheumatoid arthritis, unspecified: M06.9

## 2021-03-25 LAB — COMPREHENSIVE METABOLIC PANEL
ALT: 23 U/L (ref 0–44)
AST: 26 U/L (ref 15–41)
Albumin: 3.9 g/dL (ref 3.5–5.0)
Alkaline Phosphatase: 95 U/L (ref 38–126)
Anion gap: 14 (ref 5–15)
BUN: 5 mg/dL — ABNORMAL LOW (ref 6–20)
CO2: 18 mmol/L — ABNORMAL LOW (ref 22–32)
Calcium: 9.8 mg/dL (ref 8.9–10.3)
Chloride: 107 mmol/L (ref 98–111)
Creatinine, Ser: 0.89 mg/dL (ref 0.44–1.00)
GFR, Estimated: >60 mL/min (ref >60)
Glucose, Bld: 118 mg/dL — ABNORMAL HIGH (ref 70–99)
Potassium: 4 mmol/L (ref 3.5–5.1)
Sodium: 139 mmol/L (ref 135–145)
Total Bilirubin: 0.7 mg/dL (ref 0.3–1.2)
Total Protein: 8.6 g/dL — ABNORMAL HIGH (ref 6.5–8.1)

## 2021-03-25 LAB — CBC
HCT: 45.4 % (ref 36.0–46.0)
Hemoglobin: 15.5 g/dL — ABNORMAL HIGH (ref 12.0–15.0)
MCH: 30.3 pg (ref 26.0–34.0)
MCHC: 34.1 g/dL (ref 30.0–36.0)
MCV: 88.7 fL (ref 80.0–100.0)
Platelets: 361 10*3/uL (ref 150–400)
RBC: 5.12 MIL/uL — ABNORMAL HIGH (ref 3.87–5.11)
RDW: 15.3 % (ref 11.5–15.5)
WBC: 4.8 10*3/uL (ref 4.0–10.5)
nRBC: 0 % (ref 0.0–0.2)

## 2021-03-25 LAB — RESP PANEL BY RT-PCR (FLU A&B, COVID) ARPGX2
Influenza A by PCR: NEGATIVE
Influenza B by PCR: NEGATIVE
SARS Coronavirus 2 by RT PCR: NEGATIVE

## 2021-03-25 LAB — LIPASE, BLOOD: Lipase: 28 U/L (ref 11–51)

## 2021-03-25 MED ORDER — LORAZEPAM 2 MG/ML IJ SOLN
0.5000 mg | Freq: Four times a day (QID) | INTRAMUSCULAR | Status: DC | PRN
  Administered 2021-03-25 – 2021-03-26 (×2): 0.5 mg via INTRAVENOUS
  Filled 2021-03-25 (×2): qty 1

## 2021-03-25 MED ORDER — DIPHENHYDRAMINE HCL 50 MG/ML IJ SOLN
12.5000 mg | Freq: Once | INTRAMUSCULAR | Status: AC
  Administered 2021-03-25: 12.5 mg via INTRAVENOUS
  Filled 2021-03-25: qty 1

## 2021-03-25 MED ORDER — LACTATED RINGERS IV SOLN: INTRAVENOUS | Status: DC

## 2021-03-25 MED ORDER — ACETAMINOPHEN 325 MG PO TABS: 650.0000 mg | ORAL_TABLET | Freq: Four times a day (QID) | ORAL | Status: DC | PRN

## 2021-03-25 MED ORDER — MORPHINE SULFATE (PF) 4 MG/ML IV SOLN
4.0000 mg | Freq: Once | INTRAVENOUS | Status: AC
  Administered 2021-03-25: 4 mg via INTRAVENOUS
  Filled 2021-03-25: qty 1

## 2021-03-25 MED ORDER — NICOTINE 14 MG/24HR TD PT24: 14.0000 mg | MEDICATED_PATCH | Freq: Every day | TRANSDERMAL | Status: DC | PRN

## 2021-03-25 MED ORDER — FAMOTIDINE 20 MG PO TABS
40.0000 mg | ORAL_TABLET | Freq: Two times a day (BID) | ORAL | Status: DC
  Administered 2021-03-26: 40 mg via ORAL
  Filled 2021-03-25: qty 2

## 2021-03-25 MED ORDER — MORPHINE SULFATE (PF) 2 MG/ML IV SOLN: 2.0000 mg | INTRAVENOUS | Status: DC | PRN

## 2021-03-25 MED ORDER — METOCLOPRAMIDE HCL 5 MG/ML IJ SOLN
10.0000 mg | Freq: Once | INTRAMUSCULAR | Status: AC
  Administered 2021-03-25: 10 mg via INTRAVENOUS
  Filled 2021-03-25: qty 2

## 2021-03-25 MED ORDER — ACETAMINOPHEN 650 MG RE SUPP: 650.0000 mg | Freq: Four times a day (QID) | RECTAL | Status: DC | PRN

## 2021-03-25 MED ORDER — LORATADINE 10 MG PO TABS
10.0000 mg | ORAL_TABLET | Freq: Every day | ORAL | Status: DC
  Administered 2021-03-26: 10 mg via ORAL
  Filled 2021-03-25: qty 1

## 2021-03-25 MED ORDER — ONDANSETRON HCL 4 MG/2ML IJ SOLN
4.0000 mg | Freq: Four times a day (QID) | INTRAMUSCULAR | Status: DC | PRN
  Administered 2021-03-26: 4 mg via INTRAVENOUS
  Filled 2021-03-25: qty 2

## 2021-03-25 MED ORDER — FAMOTIDINE IN NACL 20-0.9 MG/50ML-% IV SOLN
20.0000 mg | Freq: Once | INTRAVENOUS | Status: AC
  Administered 2021-03-25: 20 mg via INTRAVENOUS
  Filled 2021-03-25: qty 50

## 2021-03-25 MED ORDER — PANTOPRAZOLE SODIUM 40 MG IV SOLR
40.0000 mg | Freq: Every day | INTRAVENOUS | Status: DC
  Administered 2021-03-26: 40 mg via INTRAVENOUS
  Filled 2021-03-25: qty 40

## 2021-03-25 MED ORDER — SODIUM CHLORIDE 0.9 % IV BOLUS
1000.0000 mL | Freq: Once | INTRAVENOUS | Status: AC
  Administered 2021-03-25: 1000 mL via INTRAVENOUS

## 2021-03-25 MED ORDER — ONDANSETRON HCL 4 MG/2ML IJ SOLN
4.0000 mg | Freq: Once | INTRAMUSCULAR | Status: AC
  Administered 2021-03-25: 4 mg via INTRAVENOUS
  Filled 2021-03-25: qty 2

## 2021-03-25 MED ORDER — DROPERIDOL 2.5 MG/ML IJ SOLN
2.5000 mg | Freq: Once | INTRAMUSCULAR | Status: AC
  Administered 2021-03-25: 2.5 mg via INTRAVENOUS
  Filled 2021-03-25: qty 2

## 2021-03-25 NOTE — ED Provider Notes (Signed)
°  Physical Exam  BP (!) 148/72 (BP Location: Right Arm)    Pulse 65    Temp 97.6 F (36.4 C) (Oral)    Resp 20    SpO2 100%   Physical Exam Vitals and nursing note reviewed.  Constitutional:      Appearance: Normal appearance.  HENT:     Head: Normocephalic and atraumatic.  Eyes:     Conjunctiva/sclera: Conjunctivae normal.  Cardiovascular:     Rate and Rhythm: Normal rate and regular rhythm.  Pulmonary:     Effort: Pulmonary effort is normal. No respiratory distress.     Breath sounds: Normal breath sounds.  Abdominal:     General: There is no distension.     Palpations: Abdomen is soft.     Tenderness: There is generalized abdominal tenderness. There is no right CVA tenderness, left CVA tenderness, guarding or rebound.  Skin:    General: Skin is warm and dry.  Neurological:     General: No focal deficit present.     Mental Status: She is alert.    Procedures  Procedures  ED Course / MDM   Clinical Course as of 03/25/21 1635  Sat Mar 25, 2021  1343 CO2(!): 18 [HK]  1506 Creatinine: 0.89 [HK]  1506 Hemoglobin(!): 15.5 [HK]  1506 WBC: 4.8 [HK]  1506 Lipase: 28 [HK]  1538 Patient reports continued nausea and have not attempted p.o. challenge yet.  Will give additional dose of pain medicine and nausea medication and reassess. [HK]    Clinical Course User Index [HK] Dietrich Pates, PA-C   Medical Decision Making  Patient is 61 y/o female who presents to the ER for generalized abdominal pain, nausea and vomiting since last night. Care assumed from previous provider Khatri PA-C at shift change. Plan at shift change was reevaluate patient after additional medications to see if patient is tolerating p.o. intake without difficulty.  On my reevaluation patient's abdominal exam remains nonsurgical.  Discussed with previous provider that CT imaging is not required today as patient's pain is generalized, with no focal findings.  Upon my reevaluation, patient continues to have  nausea and vomiting. Attempted PO challenge and patient continued vomiting after multiple rounds of fluids, anti-emetics and analgesics.  8:20pm Consulted with hospitalist Dr. Arlean Hopping to admit patient for intractable nausea/vomiting who accepted patient. The patient appears reasonably stabilized for admission considering the current resources, flow, and capabilities available in the ED at this time, and I doubt any other [facility] requiring further screening and/or treatment in the ED prior to admission.  Portions of this report may have been transcribed using voice recognition software. Every effort was made to ensure accuracy; however, inadvertent computerized transcription errors may be present.    Maximum Reiland T, PA-C 03/25/21 2023    Sloan Leiter, DO 03/25/21 2056

## 2021-03-25 NOTE — Discharge Instructions (Addendum)
Follow with Primary MD Jordan Hawks, PA-C and with your gastroenterologist in 7 days   Get CBC, CMP, Magnesium -  checked next visit within 1 week by Primary MD   Activity: As tolerated with Full fall precautions use walker/cane & assistance as needed  Disposition Home    Diet: Soft diet  Special Instructions: If you have smoked or chewed Tobacco  in the last 2 yrs please stop smoking, stop any regular Alcohol  and or any Recreational drug use.  On your next visit with your primary care physician please Get Medicines reviewed and adjusted.  Please request your Prim.MD to go over all Hospital Tests and Procedure/Radiological results at the follow up, please get all Hospital records sent to your Prim MD by signing hospital release before you go home.  If you experience worsening of your admission symptoms, develop shortness of breath, life threatening emergency, suicidal or homicidal thoughts you must seek medical attention immediately by calling 911 or calling your MD immediately  if symptoms less severe.  You Must read complete instructions/literature along with all the possible adverse reactions/side effects for all the Medicines you take and that have been prescribed to you. Take any new Medicines after you have completely understood and accpet all the possible adverse reactions/side effects.

## 2021-03-25 NOTE — ED Triage Notes (Signed)
Pt to triage via [facility] from home.  Reports nausea, vomiting, and upper abd pain since last night.  Unable to eat and drink this morning.  Did take morning meds.  States she was hospitalized for same a few months ago.  Normal BM last night.  Took oxycodone and Zofran this morning without relief.  EMS BP 160/90 HR 98 CBG 152

## 2021-03-25 NOTE — ED Notes (Signed)
Received verbal report from Chloe Y RN at this time 

## 2021-03-25 NOTE — H&P (Signed)
History and Physical    PLEASE NOTE THAT DRAGON DICTATION SOFTWARE WAS USED IN THE CONSTRUCTION OF THIS NOTE.   Kristen Ramos [id number] DOB: May 17, 1960 DOA: 03/25/2021  PCP: Mindi Curling, PA-C  Patient coming from: home   I have personally briefly reviewed patient's old medical records in [place]  Chief Complaint: Nausea vomiting  HPI: Kristen Ramos is a 61 y.o. female with medical history significant for chronic back pain in the setting of spinal stenosis of the lumbar spine, GERD, rheumatoid arthritis on chronic immunosuppressive therapy AVN methotrexate, allergic rhinitis, chronic tobacco abuse, prior episodes of intractable nausea/vomiting who is admitted to [facility] on 03/25/2021 with intractable nausea/vomiting after presenting from home to [facility] ED complaining of nausea/vomiting.   The patient reports 2 days of nausea resulting in at least 5-6 episodes per day of nonbloody, nonbilious emesis, with most recent episode of emesis occurring in the ED today.  She conveys a history of multiple prior episodes of intractable nausea/vomiting, noting that she has been previously hospitalized for such, but does not believe an underlying source has been identified for this.  It appears that she most recently presented to the emergency department complaining of nausea/vomiting in September 2022, although it does not appear that she was hospitalized at that time.  In the setting of this recurrent nausea/vomiting, she reports significant decline in her oral intake of both food and water over the course of the last 1 to 2 days.  She notes mild diffuse abdominal discomfort over the course of the last day, which started after her first half a dozen episodes of nausea/vomiting.  She reports that this pain is crampy in nature, diffusely distributed, and worsens with subsequent episodes of nausea/vomiting, as well as with palpation of the abdomen.  Denies any associated diarrhea, melena,  or hematochezia.  Most recent bowel movement occurred earlier today and associated with well formed stool.  No recent trauma or travel.  Associate with any subjective fever, chills and rigors, generalized myalgias, or rash.  Also denies any recent dysuria, gross hematuria, or change in urinary urgency/frequency.  Not associate with any chest pain, shortness of breath, palpitations, diaphoresis, dizziness, presyncope, or syncope.  Also not associate any headache, neck stiffness.  Denies any history of recent or regular alcohol consumption, nor any history of recreational drug use.  She also denies any known history of underlying diabetes.  In the setting of a history of chronic back pain she is on chronic opioid therapy via prn oxycodone at home.    ED Course:  Vital signs in the ED were notable for the following: Afebrile; heart rate 65-87; blood pressure 148/72 - 150/68; respiratory rate 16-22, oxygen saturation 97 to 100% on room air.  Labs were notable for the following: CMP notable for the following: Sodium 139, potassium 4.0, bicarbonate 18, anion gap 14, creatinine 0.89 compared to most recent prior value 0.67 in September 2022, glucose 118, and liver enzymes within normal limits per lipase 20.  CBC notable for white cell count 4800.  Urinalysis unremarkable presumptively pending.  COVID-19/influenza PCR were checked in the ED today and found to be negative.  Imaging and additional notable ED work-up: (None)  While in the ED, the following were administered: Reglan 10 mg IV x1, Zofran 4 mg IV x1, morphine 4 mg IV x1, 1 L normal saline bolus pain subsided, the patient was admitted for overnight observation to med telemetry further evaluation and management of intractable nausea/vomiting, complicated by acute  kidney injury.     Review of Systems: As per HPI otherwise 10 point review of systems negative.   Past Medical History:  Diagnosis Date   Allergic rhinitis    Arthritis    Chronic  back pain    GERD (gastroesophageal reflux disease)    RA (rheumatoid arthritis) ([place])     Past Surgical History:  Procedure Laterality Date   ABDOMINAL HYSTERECTOMY     BACK SURGERY     BIOPSY  04/19/2018   Procedure: BIOPSY;  Surgeon: Ronnette Juniper, MD;  Location: [facility];  Service: Gastroenterology;;   CESAREAN SECTION     ESOPHAGOGASTRODUODENOSCOPY (EGD) WITH PROPOFOL N/A 04/19/2018   Procedure: ESOPHAGOGASTRODUODENOSCOPY (EGD) WITH PROPOFOL;  Surgeon: Ronnette Juniper, MD;  Location: [facility];  Service: Gastroenterology;  Laterality: N/A;    Social History:  reports that she has been smoking cigarettes. She has never used smokeless tobacco. She reports that she does not drink alcohol and does not use drugs.   Allergies  Allergen Reactions   Bee Venom Swelling    Family history reviewed and not pertinent    Prior to Admission medications   Medication Sig Start Date End Date Taking? Authorizing Provider  amitriptyline (ELAVIL) 25 MG tablet Take 25 mg by mouth daily. 05/21/19   [provider]  cetirizine (ZYRTEC) 10 MG tablet Take 10 mg by mouth daily. 05/26/19   [provider]  famotidine (PEPCID) 40 MG tablet Take 40 mg by mouth 2 (two) times daily. 03/31/19   [provider]  fluticasone (FLONASE) 50 MCG/ACT nasal spray Place 2 sprays into both nostrils daily. 05/20/19   [provider]  folic acid (FOLVITE) 1 MG tablet Take 1 mg by mouth daily. 04/01/19   [provider]  HORIZANT 600 MG TBCR Take 1 tablet by mouth 2 (two) times daily. 06/01/19   [provider]  meclizine (ANTIVERT) 25 MG tablet Take 1 tablet (25 mg total) by mouth 3 (three) times daily as needed for dizziness or nausea. 06/22/19   Raiford Noble Latif, DO  methotrexate (RHEUMATREX) 2.5 MG tablet Take 15 mg by mouth once a week. 03/31/19   [provider]  nicotine (NICODERM CQ - DOSED IN MG/24 HOURS) 21 mg/24hr patch Place 1 patch (21 mg total) onto  the skin daily. 06/23/19   Sheikh, Omair Latif, DO  ondansetron (ZOFRAN) 4 MG tablet Take 1 tablet (4 mg total) by mouth every 6 (six) hours as needed for nausea. 06/22/19   Raiford Noble Latif, DO  oxyCODONE (ROXICODONE) 15 MG immediate release tablet Take 15 mg by mouth 4 (four) times daily as needed for pain.  02/03/18   [provider]  pantoprazole (PROTONIX) 40 MG tablet Take 1 tablet (40 mg total) by mouth daily. 06/22/19   Sheikh, Georgina Quint Latif, DO  polyethylene glycol (MIRALAX / GLYCOLAX) 17 g packet Take 17 g by mouth daily. 06/23/19   Raiford Noble Latif, DO  scopolamine (TRANSDERM-SCOP) 1 MG/3DAYS Place 1 patch (1.5 mg total) onto the skin every 3 (three) days. 06/24/19   Raiford Noble Latif, DO  sucralfate (CARAFATE) 1 GM/10ML suspension Take 1 g by mouth 4 (four) times daily. 05/20/19   [provider]  tiZANidine (ZANAFLEX) 4 MG tablet Take 4 mg by mouth 2 (two) times daily as needed for muscle spasms. 09/29/18   [provider]     Objective    Physical Exam: Vitals:   03/25/21 1806 03/25/21 1826 03/25/21 1827 03/25/21 1830  BP:  Marland Kitchen)  149/134 (!) 178/157 (!) 173/148  Pulse: 70 87 70 69  Resp:    18  Temp:      TempSrc:      SpO2: 99% 97% 100% 97%    General: appears to be stated age; alert, oriented Skin: warm, dry, no rash Head:  AT/ Mouth:  Oral mucosa membranes appear dry, normal dentition Neck: supple; trachea midline Heart:  RRR; did not appreciate any M/R/G Lungs: CTAB, did not appreciate any wheezes, rales, or rhonchi Abdomen: + BS; soft, ND, mild diffuse tenderness, in the absence of any associated guarding, rigidity, or rebound tenderness. Vascular: 2+ pedal pulses b/l; 2+ radial pulses b/l Extremities: no peripheral edema, no muscle wasting Neuro: strength and sensation intact in upper and lower extremities b/l   Labs on Admission: I have personally reviewed following labs and imaging studies  CBC: Recent Labs  Lab 03/25/21 1200   WBC 4.8  HGB 15.5*  HCT 45.4  MCV 88.7  PLT [id number]   Basic Metabolic Panel: Recent Labs  Lab 03/25/21 1200  NA 139  K 4.0  CL 107  CO2 18*  GLUCOSE 118*  BUN 5*  CREATININE 0.89  CALCIUM 9.8   GFR: CrCl cannot be calculated (Unknown ideal weight.). Liver Function Tests: Recent Labs  Lab 03/25/21 1200  AST 26  ALT 23  ALKPHOS 95  BILITOT 0.7  PROT 8.6*  ALBUMIN 3.9   Recent Labs  Lab 03/25/21 1200  LIPASE 28   No results for input(s): AMMONIA in the last 168 hours. Coagulation Profile: No results for input(s): INR, PROTIME in the last 168 hours. Cardiac Enzymes: No results for input(s): CKTOTAL, CKMB, CKMBINDEX, TROPONINI in the last 168 hours. BNP (last 3 results) No results for input(s): PROBNP in the last 8760 hours. HbA1C: No results for input(s): HGBA1C in the last 72 hours. CBG: No results for input(s): GLUCAP in the last 168 hours. Lipid Profile: No results for input(s): CHOL, HDL, LDLCALC, TRIG, CHOLHDL, LDLDIRECT in the last 72 hours. Thyroid Function Tests: No results for input(s): TSH, T4TOTAL, FREET4, T3FREE, THYROIDAB in the last 72 hours. Anemia Panel: No results for input(s): VITAMINB12, FOLATE, FERRITIN, TIBC, IRON, RETICCTPCT in the last 72 hours. Urine analysis:    Component Value Date/Time   COLORURINE YELLOW 06/17/2019 1530   APPEARANCEUR HAZY (A) 06/17/2019 1530   LABSPEC 1.017 06/17/2019 1530   PHURINE 6.0 06/17/2019 1530   GLUCOSEU NEGATIVE 06/17/2019 1530   HGBUR NEGATIVE 06/17/2019 1530   BILIRUBINUR NEGATIVE 06/17/2019 1530   KETONESUR 5 (A) 06/17/2019 1530   PROTEINUR NEGATIVE 06/17/2019 1530   NITRITE NEGATIVE 06/17/2019 1530   LEUKOCYTESUR NEGATIVE 06/17/2019 1530    Radiological Exams on Admission: No results found.     Assessment/Plan   Principal Problem:   Intractable nausea and vomiting Active Problems:   Abdominal pain   AKI (acute kidney injury) ([place])   Allergic rhinitis   GERD (gastroesophageal  reflux disease)   Tobacco abuse     #) Intractable nausea/vomiting: The patient presents with 2 days of recurrent nausea resulting in 5-6 daily episodes of nonbloody, nonbilious emesis over that timeframe, significantly limiting her ability to tolerate p.o. over that timeframe, which appears to have resulted in acute kidney injury in the setting of resultant dehydration, as further detailed below.  She has a documented history of recurrent episodes of intractable nausea/vomiting, although source for such is unclear to me at this time.  No history of diabetes or diabetic gastroparesis.  No evidence of  underlying infection, and does not appear septic.  No recent travel.  UA pending.  Will check urinary drug screen.  Potential contribution from her chronic opioid dependence, on prn oxycodone as an outpatient in setting of chronic back pain.  Liver enzymes not elevated, lipase not elevated.  No associated any diarrhea to suggest gastroenteritis.  Continues to have regular, normal, well formed stools as well as continued flatus production to speak against suspicion for obstruction.    Plan: Clear liquid diet, advance as tolerated to full regular diet.  Lactated Ringer's at 100 cc/h.  Prn IV Zofran.  As needed IV Ativan for nausea/vomiting refractory to Zofran.  Check EKG, including to evaluate QTc interval.  Follow-up result UA.  Check urinary drug screen.  Monitor strict I's and O's and daily weights repeat CMP/CBC in the morning.  Add on serum magnesium level.  Further evaluation management of presenting AKI, as further detailed below.     #) Generalized abdominal discomfort.  1 day of crampy abdominal discomfort, without focal findings on physical exam, nor any evidence of acute peritoneal signs.  Appears musculoskeletal in nature, particularly given onset following the first several episodes of nausea/vomiting.  Given this history as well as benign physical exam findings, and no significant elevation of  liver enzymes, and nonelevated lipase, and no evidence to suggest bowel obstruction at this time, will refrain from pursuit of dedicated abdominal imaging at this time.   Plan: Holding home prn oxycodone in the setting of AKI, and associated inability to tolerate p.o. at this time, particular given potential contribution from chronic opioid use.  Prn IV morphine.  Prn Narcan ordered.  Repeat CMP/CBC in the morning.       #) acute kidney injury: Diagnosis on the basis of greater than 30% increase in serum creatinine presenting labs relative to most recent prior value of 0.67,which was representative of her baseline renal function.  Appears to be prerenal in nature in setting of mild dehydration as a consequence of significant decline in oral intake as well as increase in GI losses over the course of the last few days will basis of presenting intractable nausea/vomiting.  Of note, urinalysis with microscopy has been ordered, with results pending.  Plan: Follow-up result urinalysis microscopy rule out [place] urine sent as well as random urine creatinine.  Check CPK.  Monitor strict I's and O's and daily weights.  Tempt avoid nephrotoxic's.  Consistent with this, will hold home oxycodone for now due to increased risk for buildup of nephrotoxic metabolites.  Repeat BMP in the morning.  IV fluids, as above.         #) GERD: documented h/o such; on Protonix as well as Pepcid as outpatient.  Given current inability to tolerate p.o., will provide IV administration for home Protonix  Plan: continue home PPI.  Protonix 40 mg IV daily, as above.  Resume home Pepcid, with next dose to occur in the morning.        #) Allergic Rhinitis: documented h/o such, on scheduled Zyrtec as outpatient, will noting that she is not on any scheduled or prn intranasal steroid at home.   Plan: cont home Zyrtec.        #) Tobacco abuse: Patient reports that she is a current smoker, and smoked half pack a  full pack per day however the last 20 years.  Plan: Counseled patient on the importance of complete smoking discontinuation.  I also placed order for prn nicotine patch for use during this  hospitalization.       DVT prophylaxis: SCD's   Code Status: Full code Family Communication: none Disposition Plan: Per Rounding Team Consults called: none;  Admission status: Observation; med telemetry   PLEASE NOTE THAT DRAGON DICTATION SOFTWARE WAS USED IN THE CONSTRUCTION OF THIS NOTE.   Arnett DO [facility]  From [place]   03/25/2021, 8:32 PM

## 2021-03-25 NOTE — ED Provider Notes (Signed)
[facility] EMERGENCY DEPARTMENT Provider Note   CSN: [id number] Arrival date & time: 03/25/21  1127     History  Chief Complaint  Patient presents with   Abdominal Pain    Kristen Ramos is a 61 y.o. female with a past medical history of cyclical vomiting syndrome presenting to the ED with a chief complaint of generalized abdominal pain, vomiting and nausea.  Symptoms began last night and have gradually worsened.  She attempted to take her home pain medicine and antiemetic approximately 9 hours ago without much improvement.  States that this feels similar to her prior episodes of cyclical vomiting.  She has never been told in the past why she is having the symptoms.  Denies any changes to bowel movements, urination, fever, sick contacts with similar symptoms, chest pain or shortness of breath.   Abdominal Pain Associated symptoms: nausea and vomiting   Associated symptoms: no chest pain, no chills, no constipation, no cough, no diarrhea, no dysuria, no fever, no hematuria, no shortness of breath and no sore throat       Home Medications Prior to Admission medications   Medication Sig Start Date End Date Taking? Authorizing Provider  amitriptyline (ELAVIL) 25 MG tablet Take 25 mg by mouth daily. 05/21/19   [provider]  cetirizine (ZYRTEC) 10 MG tablet Take 10 mg by mouth daily. 05/26/19   [provider]  famotidine (PEPCID) 40 MG tablet Take 40 mg by mouth 2 (two) times daily. 03/31/19   [provider]  fluticasone (FLONASE) 50 MCG/ACT nasal spray Place 2 sprays into both nostrils daily. 05/20/19   [provider]  folic acid (FOLVITE) 1 MG tablet Take 1 mg by mouth daily. 04/01/19   [provider]  HORIZANT 600 MG TBCR Take 1 tablet by mouth 2 (two) times daily. 06/01/19   [provider]  meclizine (ANTIVERT) 25 MG tablet Take 1 tablet (25 mg total) by mouth 3 (three) times daily as needed for dizziness or  nausea. 06/22/19   Raiford Noble Latif, DO  methotrexate (RHEUMATREX) 2.5 MG tablet Take 15 mg by mouth once a week. 03/31/19   [provider]  nicotine (NICODERM CQ - DOSED IN MG/24 HOURS) 21 mg/24hr patch Place 1 patch (21 mg total) onto the skin daily. 06/23/19   Sheikh, Omair Latif, DO  ondansetron (ZOFRAN) 4 MG tablet Take 1 tablet (4 mg total) by mouth every 6 (six) hours as needed for nausea. 06/22/19   Raiford Noble Latif, DO  oxyCODONE (ROXICODONE) 15 MG immediate release tablet Take 15 mg by mouth 4 (four) times daily as needed for pain.  02/03/18   [provider]  pantoprazole (PROTONIX) 40 MG tablet Take 1 tablet (40 mg total) by mouth daily. 06/22/19   Sheikh, Georgina Quint Latif, DO  polyethylene glycol (MIRALAX / GLYCOLAX) 17 g packet Take 17 g by mouth daily. 06/23/19   Raiford Noble Latif, DO  scopolamine (TRANSDERM-SCOP) 1 MG/3DAYS Place 1 patch (1.5 mg total) onto the skin every 3 (three) days. 06/24/19   Raiford Noble Latif, DO  sucralfate (CARAFATE) 1 GM/10ML suspension Take 1 g by mouth 4 (four) times daily. 05/20/19   [provider]  tiZANidine (ZANAFLEX) 4 MG tablet Take 4 mg by mouth 2 (two) times daily as needed for muscle spasms. 09/29/18   [provider]      Allergies    Bee venom    Review of Systems   Review of Systems  Constitutional:  Negative for appetite change, chills and fever.  HENT:  Negative for ear pain, rhinorrhea, sneezing and sore throat.   Eyes:  Negative for photophobia and visual disturbance.  Respiratory:  Negative for cough, chest tightness, shortness of breath and wheezing.   Cardiovascular:  Negative for chest pain and palpitations.  Gastrointestinal:  Positive for abdominal pain, nausea and vomiting. Negative for blood in stool, constipation and diarrhea.  Genitourinary:  Negative for dysuria, hematuria and urgency.  Musculoskeletal:  Negative for myalgias.  Skin:  Negative for rash.  Neurological:  Negative for  dizziness, weakness and light-headedness.   Physical Exam Updated Vital Signs BP (!) 148/72 (BP Location: Right Arm)    Pulse 65    Temp 97.6 F (36.4 C) (Oral)    Resp 20    SpO2 100%  Physical Exam Vitals and nursing note reviewed.  Constitutional:      General: She is not in acute distress.    Appearance: She is well-developed.  HENT:     Head: Normocephalic and atraumatic.     Nose: Nose normal.  Eyes:     General: No scleral icterus.       Left eye: No discharge.     Conjunctiva/sclera: Conjunctivae normal.  Cardiovascular:     Rate and Rhythm: Normal rate and regular rhythm.     Heart sounds: Normal heart sounds. No murmur heard.   No friction rub. No gallop.  Pulmonary:     Effort: Pulmonary effort is normal. No respiratory distress.     Breath sounds: Normal breath sounds.  Abdominal:     General: Bowel sounds are normal. There is no distension.     Palpations: Abdomen is soft.     Tenderness: There is generalized abdominal tenderness. There is no guarding.  Musculoskeletal:        General: Normal range of motion.     Cervical back: Normal range of motion and neck supple.  Skin:    General: Skin is warm and dry.     Findings: No rash.  Neurological:     Mental Status: She is alert.     Motor: No abnormal muscle tone.     Coordination: Coordination normal.    ED Results / Procedures / Treatments   Labs (all labs ordered are listed, but only abnormal results are displayed) Labs Reviewed  COMPREHENSIVE METABOLIC PANEL - Abnormal; Notable for the following components:      Result Value   CO2 18 (*)    Glucose, Bld 118 (*)    BUN 5 (*)    Total Protein 8.6 (*)    All other components within normal limits  CBC - Abnormal; Notable for the following components:   RBC 5.12 (*)    Hemoglobin 15.5 (*)    All other components within normal limits  LIPASE, BLOOD  URINALYSIS, ROUTINE W REFLEX MICROSCOPIC    EKG None  Radiology No results  found.  Procedures Procedures    Medications Ordered in ED Medications  ondansetron (ZOFRAN) injection 4 mg (has no administration in time range)  morphine 4 MG/ML injection 4 mg (has no administration in time range)  sodium chloride 0.9 % bolus 1,000 mL (1,000 mLs Intravenous New Bag/Given 03/25/21 1459)  metoCLOPramide (REGLAN) injection 10 mg (10 mg Intravenous Given 03/25/21 1453)  diphenhydrAMINE (BENADRYL) injection 12.5 mg (12.5 mg Intravenous Given 03/25/21 1453)  famotidine (PEPCID) IVPB 20 mg premix (0 mg Intravenous Stopped 03/25/21 1536)    ED Course/ Medical Decision Making/ A&P  Clinical Course as of 03/25/21 1540  Sat Mar 25, 2021  1343 CO2(!): 18 [HK]  1506 Creatinine: 0.89 [HK]  1506 Hemoglobin(!): 15.5 [HK]  1506 WBC: 4.8 [HK]  1506 Lipase: 28 [HK]  1538 Patient reports continued nausea and have not attempted p.o. challenge yet.  Will give additional dose of pain medicine and nausea medication and reassess. [HK]    Clinical Course User Index [HK] Delia Heady, PA-C                           Medical Decision Making  61 year old female with past medical history of cyclical vomiting presenting to the ED with a chief complaint of nausea, vomiting and abdominal pain.  Symptoms began last night and worsened today despite taking her home medications.  On exam abdomen is generally tender without rebound or guarding.  She is heaving on my exam.  Labs ordered in triage so unremarkable CBC, CMP and lipase.  Will attempt to treat symptomatically and reassess.  Patient reports continued symptoms despite first round of medications.  She remains hemodynamically stable.  Will attempt to further treat with additional pain medicine and nausea medicine and hopefully can p.o. challenge her.  I suspect that this is caused by her cyclical vomiting that she is experienced in the past. Care handed off to oncoming provider pending recheck after additional medications.  If she is able to  tolerate p.o. intake without difficulty I anticipate discharge home with continued home medications and outpatient follow-up.   Portions of this note were generated with Lobby[employer]. Dictation errors may occur despite best attempts at proofreading.        Final Clinical Impression(s) / ED Diagnoses Final diagnoses:  Nausea and vomiting, unspecified vomiting type    Rx / DC Orders ED Discharge Orders     None         Delia Heady, PA-C 03/25/21 [place], DO 03/25/21 2056

## 2021-03-26 ENCOUNTER — Observation Stay (HOSPITAL_COMMUNITY): Payer: Medicare (Managed Care)

## 2021-03-26 DIAGNOSIS — Z20822 Contact with and (suspected) exposure to covid-19: Secondary | ICD-10-CM | POA: Diagnosis not present

## 2021-03-26 DIAGNOSIS — N179 Acute kidney failure, unspecified: Secondary | ICD-10-CM | POA: Diagnosis not present

## 2021-03-26 DIAGNOSIS — F17210 Nicotine dependence, cigarettes, uncomplicated: Secondary | ICD-10-CM | POA: Diagnosis not present

## 2021-03-26 DIAGNOSIS — R112 Nausea with vomiting, unspecified: Secondary | ICD-10-CM | POA: Diagnosis not present

## 2021-03-26 LAB — CBC WITH DIFFERENTIAL/PLATELET
Abs Immature Granulocytes: 0.02 10*3/uL (ref 0.00–0.07)
Basophils Absolute: 0 10*3/uL (ref 0.0–0.1)
Basophils Relative: 0 %
Eosinophils Absolute: 0 10*3/uL (ref 0.0–0.5)
Eosinophils Relative: 0 %
HCT: 42.5 % (ref 36.0–46.0)
Hemoglobin: 14.4 g/dL (ref 12.0–15.0)
Immature Granulocytes: 0 %
Lymphocytes Relative: 11 %
Lymphs Abs: 0.6 10*3/uL — ABNORMAL LOW (ref 0.7–4.0)
MCH: 30 pg (ref 26.0–34.0)
MCHC: 33.9 g/dL (ref 30.0–36.0)
MCV: 88.5 fL (ref 80.0–100.0)
Monocytes Absolute: 0.1 10*3/uL (ref 0.1–1.0)
Monocytes Relative: 2 %
Neutro Abs: 4.9 10*3/uL (ref 1.7–7.7)
Neutrophils Relative %: 87 %
Platelets: 338 10*3/uL (ref 150–400)
RBC: 4.8 MIL/uL (ref 3.87–5.11)
RDW: 15.8 % — ABNORMAL HIGH (ref 11.5–15.5)
WBC: 5.7 10*3/uL (ref 4.0–10.5)
nRBC: 0 % (ref 0.0–0.2)

## 2021-03-26 LAB — URINALYSIS, MICROSCOPIC (REFLEX)

## 2021-03-26 LAB — URINALYSIS, ROUTINE W REFLEX MICROSCOPIC
Bilirubin Urine: NEGATIVE
Glucose, UA: NEGATIVE mg/dL
Ketones, ur: 15 mg/dL — AB
Leukocytes,Ua: NEGATIVE
Nitrite: NEGATIVE
Protein, ur: NEGATIVE mg/dL
Specific Gravity, Urine: 1.02 (ref 1.005–1.030)
pH: 7 (ref 5.0–8.0)

## 2021-03-26 LAB — COMPREHENSIVE METABOLIC PANEL
ALT: 20 U/L (ref 0–44)
AST: 20 U/L (ref 15–41)
Albumin: 3.9 g/dL (ref 3.5–5.0)
Alkaline Phosphatase: 87 U/L (ref 38–126)
Anion gap: 11 (ref 5–15)
BUN: 6 mg/dL (ref 6–20)
CO2: 20 mmol/L — ABNORMAL LOW (ref 22–32)
Calcium: 9.2 mg/dL (ref 8.9–10.3)
Chloride: 107 mmol/L (ref 98–111)
Creatinine, Ser: 0.71 mg/dL (ref 0.44–1.00)
GFR, Estimated: >60 mL/min (ref >60)
Glucose, Bld: 130 mg/dL — ABNORMAL HIGH (ref 70–99)
Potassium: 3.6 mmol/L (ref 3.5–5.1)
Sodium: 138 mmol/L (ref 135–145)
Total Bilirubin: 0.6 mg/dL (ref 0.3–1.2)
Total Protein: 8.1 g/dL (ref 6.5–8.1)

## 2021-03-26 LAB — OCCULT BLOOD GASTRIC / DUODENUM (SPECIMEN CUP): Occult Blood, Gastric: POSITIVE — AB

## 2021-03-26 LAB — RAPID URINE DRUG SCREEN, HOSP PERFORMED
Amphetamines: NOT DETECTED
Barbiturates: NOT DETECTED
Benzodiazepines: NOT DETECTED
Cocaine: NOT DETECTED
Opiates: POSITIVE — AB
Tetrahydrocannabinol: NOT DETECTED

## 2021-03-26 LAB — MAGNESIUM
Magnesium: 2.1 mg/dL (ref 1.7–2.4)
Magnesium: 2.2 mg/dL (ref 1.7–2.4)

## 2021-03-26 LAB — HIV ANTIBODY (ROUTINE TESTING W REFLEX): HIV Screen 4th Generation wRfx: NONREACTIVE

## 2021-03-26 LAB — SODIUM, URINE, RANDOM: Sodium, Ur: 188 mmol/L

## 2021-03-26 LAB — CK: Total CK: 169 U/L (ref 38–234)

## 2021-03-26 LAB — CREATININE, URINE, RANDOM: Creatinine, Urine: 150.82 mg/dL

## 2021-03-26 MED ORDER — ONDANSETRON HCL 4 MG PO TABS: 4.0000 mg | ORAL_TABLET | Freq: Four times a day (QID) | ORAL | 0 refills | Status: DC | PRN

## 2021-03-26 NOTE — Evaluation (Signed)
Physical Therapy Evaluation Patient Details Name: Kristen Ramos MRN: [id number] DOB: 1960/09/30 Today's Date: 03/26/2021  History of Present Illness  Pt is a 61 y.o. F who presents 03/25/2021 with intractable nausea/vomiting. Significant PMH: chronic back pain, GERD, rheumatoid arthritis on chronic immunosuppressive therapy AVN methotrexate, chronic tobacco use.  Clinical Impression  PTA, pt lives with her 19 y.o. granddaughter in a two story home; does not work. Pt reports her granddaughter is staying at a friend's house while she is inpatient. Pt with decreased functional mobility secondary to malaise, continued nausea, dizziness, and back pain (chronic). Pt requiring min assist to stand x 2. Pt immediately sat back down after each trial due to symptoms listed above. BP 148/118 (pt moving arm), SpO2 100% on RA, HR 67-110 bpm. Suspect pt will progress well once she is feeling better. Will reassess.     Recommendations for follow up therapy are one component of a multi-disciplinary discharge planning process, led by the attending physician.  Recommendations may be updated based on patient status, additional functional criteria and insurance authorization.  Follow Up Recommendations Home health PT (potentially, will reassess follow up needs with increased participation)    Assistance Recommended at Discharge PRN  Patient can return home with the following       Equipment Recommendations Other (comment) (TBA)  Recommendations for Other Services       Functional Status Assessment Patient has had a recent decline in their functional status and demonstrates the ability to make significant improvements in function in a reasonable and predictable amount of time.     Precautions / Restrictions Precautions Precautions: Fall Restrictions Weight Bearing Restrictions: No      Mobility  Bed Mobility Overal bed mobility: Needs Assistance Bed Mobility: Supine to Sit;Sit to Supine     Supine  to sit: Supervision Sit to supine: Supervision   General bed mobility comments: no physical assist required    Transfers Overall transfer level: Needs assistance Equipment used: Rolling walker (2 wheels);None Transfers: Sit to/from Stand Sit to Stand: Min assist           General transfer comment: Assist to rise and steady x 2 with and without the RW.    Ambulation/Gait               General Gait Details: pt declining due to dizziness/nausea  Stairs            Wheelchair Mobility    Modified Rankin (Stroke Patients Only)       Balance Overall balance assessment: Needs assistance Sitting-balance support: Feet supported Sitting balance-Leahy Scale: Fair     Standing balance support: No upper extremity supported;During functional activity Standing balance-Leahy Scale: Poor                               Pertinent Vitals/Pain Pain Assessment: Faces Faces Pain Scale: Hurts little more Pain Location: low back Pain Descriptors / Indicators: Tender;Sore Pain Intervention(s): Limited activity within patient's tolerance;Monitored during session    Home Living Family/patient expects to be discharged to:: Private residence Living Arrangements: Other relatives (76 y.o. granddaughter) Available Help at Discharge: Friend(s) Type of Home: House Home Access: Level entry     Alternate Level Stairs-Number of Steps:  (flight) Home Layout: Two level Home Equipment: Agricultural consultant (2 wheels)      Prior Function Prior Level of Function : Independent/Modified Independent  Mobility Comments: does not work       Higher education careers adviser        Extremity/Trunk Assessment   Upper Extremity Assessment Upper Extremity Assessment: Defer to OT evaluation    Lower Extremity Assessment Lower Extremity Assessment: Overall WFL for tasks assessed    Cervical / Trunk Assessment Cervical / Trunk Assessment: Normal  Communication    Communication: No difficulties  Cognition Arousal/Alertness: Lethargic Behavior During Therapy: Restless Overall Cognitive Status: Difficult to assess                                 General Comments: pt will respond when directly asked a question, otherwise limited communication with therapist and poor safety awareness and command following. likely heavily impacted by feeling poorly        General Comments      Exercises     Assessment/Plan    PT Assessment Patient needs continued PT services  PT Problem List Decreased strength;Decreased activity tolerance;Decreased mobility;Decreased balance;Decreased cognition;Decreased safety awareness       PT Treatment Interventions DME instruction;Gait training;Therapeutic activities;Stair training;Functional mobility training;Therapeutic exercise;Balance training;Patient/family education    PT Goals (Current goals can be found in the Care Plan section)  Acute Rehab PT Goals Patient Stated Goal: feel better PT Goal Formulation: With patient Time For Goal Achievement: 04/09/21 Potential to Achieve Goals: Good    Frequency Min 3X/week     Co-evaluation               AM-PAC PT "6 Clicks" Mobility  Outcome Measure Help needed turning from your back to your side while in a flat bed without using bedrails?: A Little Help needed moving from lying on your back to sitting on the side of a flat bed without using bedrails?: A Little Help needed moving to and from a bed to a chair (including a wheelchair)?: A Little Help needed standing up from a chair using your arms (e.g., wheelchair or bedside chair)?: A Little Help needed to walk in hospital room?: A Little Help needed climbing 3-5 steps with a railing? : Total 6 Click Score: 16    End of Session   Activity Tolerance: Other (comment) (limited by nausea/dizziness/malaise) Patient left: in bed;with call bell/phone within reach;with bed alarm set Nurse  Communication: Mobility status PT Visit Diagnosis: Unsteadiness on feet (R26.81);Difficulty in walking, not elsewhere classified (R26.2)    Time: 3664-4034 PT Time Calculation (min) (ACUTE ONLY): 20 min   Charges:   PT Evaluation $PT Eval Low Complexity: 1 Low          Lillia Pauls, PT, DPT Acute Rehabilitation Services Pager [phone] Office [phone]   Norval Morton 03/26/2021, 8:51 AM

## 2021-03-26 NOTE — Discharge Summary (Signed)
Kristen Ramos JYN:[id number] DOB: [birth year] DOA: 03/25/2021  PCP: Jordan Hawks, PA-C  Admit date: 03/25/2021  Discharge date: 03/26/2021  Admitted From: Home   Disposition:  Home   Recommendations for Outpatient Follow-up:   Follow up with PCP in 1-2 weeks  PCP Please obtain BMP/CBC, 2 view CXR in 1week,  (see Discharge instructions)   PCP Please follow up on the following pending results:    Home Health: None   Equipment/Devices: None  Consultations: None  Discharge Condition: Stable    CODE STATUS: Full    Diet Recommendation: Soft    Chief Complaint  Patient presents with   Abdominal Pain     Brief history of present illness from the day of admission and additional interim summary    Kristen Ramos is a 61 y.o. female with medical history significant for chronic nausea and vomiting under the care of [facility], chronic back pain in the setting of spinal stenosis of the lumbar spine, GERD, rheumatoid arthritis on chronic immunosuppressive therapy AVN methotrexate, allergic rhinitis, chronic tobacco abuse, presented to the ER with worsening of chronic nausea vomiting and was kept in observation for that.                                                                 Hospital Course    1.  Acute on chronic nausea and vomiting.  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  2.  Dehydration with AKI .  Resolved after IV  fluids  3.  Tobacco abuse.  Counseled to quit  4.  GERD.  On Pepcid and PPI continue.  5.  Chronic pain.  On home medications unchanged.     Discharge diagnosis     Principal Problem:   Intractable nausea and vomiting Active Problems:   Abdominal pain   AKI (acute kidney injury) (HCC)   Allergic rhinitis   GERD (gastroesophageal reflux disease)   Tobacco abuse    Discharge instructions    Discharge Instructions     Discharge instructions   Complete by: As directed    Follow with Primary MD Jordan Hawks, PA-C and with your gastroenterologist in 7 days   Get CBC, CMP, Magnesium -  checked next visit within 1 week by Primary MD   Activity: As tolerated with Full fall precautions use walker/cane & assistance as needed  Disposition Home    Diet: Soft diet  Special Instructions:  If you have smoked or chewed Tobacco  in the last 2 yrs please stop smoking, stop any regular Alcohol  and or any Recreational drug use.  On your next visit with your primary care physician please Get Medicines reviewed and adjusted.  Please request your Prim.MD to go over all Hospital Tests and Procedure/Radiological results at the follow up, please get all Hospital records sent to your Prim MD by signing hospital release before you go home.  If you experience worsening of your admission symptoms, develop shortness of breath, life threatening emergency, suicidal or homicidal thoughts you must seek medical attention immediately by calling 911 or calling your MD immediately  if symptoms less severe.  You Must read complete instructions/literature along with all the possible adverse reactions/side effects for all the Medicines you take and that have been prescribed to you. Take any new Medicines after you have completely understood and accpet all the possible adverse reactions/side effects.   Increase activity slowly   Complete by: As directed        Discharge Medications   Allergies as of  03/26/2021       Reactions   Bee Venom Swelling        Medication List     STOP taking these medications    nicotine 21 mg/24hr patch Commonly known as: NICODERM CQ - dosed in mg/24 hours       TAKE these medications    amitriptyline 25 MG tablet Commonly known as: ELAVIL Take 25 mg by mouth daily.   cetirizine 10 MG tablet Commonly known as: ZYRTEC Take 10 mg by mouth daily.   famotidine 40 MG tablet Commonly known as: PEPCID Take 40 mg by mouth 2 (two) times daily.   folic acid 1 MG tablet Commonly known as: FOLVITE Take 1 mg by mouth daily.   Horizant 600 MG Tbcr Generic drug: Gabapentin Enacarbil Take 1 tablet by mouth 2 (two) times daily.   meclizine 25 MG tablet Commonly known as: ANTIVERT Take 1 tablet (25 mg total) by mouth 3 (three) times daily as needed for dizziness or nausea.   methotrexate 2.5 MG tablet Commonly known as: RHEUMATREX Take 15 mg by mouth once a week.   ondansetron 4 MG tablet Commonly known as: ZOFRAN Take 1 tablet (4 mg total) by mouth every 6 (six) hours as needed for nausea or refractory nausea / vomiting. What changed: reasons to take this   oxyCODONE 15 MG immediate release tablet Commonly known as: ROXICODONE Take 15 mg by mouth 4 (four) times daily as needed for pain.   pantoprazole 40 MG tablet Commonly known as: PROTONIX Take 1 tablet (40 mg total) by mouth daily.   polyethylene glycol 17 g packet Commonly known as: MIRALAX / GLYCOLAX Take 17 g by mouth daily. What changed:  when to take this reasons to take this   scopolamine 1 MG/3DAYS Commonly known as: TRANSDERM-SCOP Place 1 patch (1.5 mg total) onto the skin every 3 (three) days.   tiZANidine 4 MG tablet Commonly known as: ZANAFLEX Take 4 mg by mouth 2 (two) times daily as needed for muscle spasms.         Follow-up Information     Jordan Hawks, PA-C. Schedule an appointment as soon as possible for a visit .   Specialty: Physician  Assistant Contact information: [address] [place] [address] [phone]         [facility] EMERGENCY DEPARTMENT .   Specialty:  Emergency Medicine Why: If symptoms worsen Contact information: [address] [id number] [place] [id number] [phone]        Jordan HawksGordon, Sarah B, PA-C. Schedule an appointment as soon as possible for a visit in 1 week(s).   Specialty: Physician Assistant Contact information: [address] [place] [id number] [phone]                 Major procedures and Radiology Reports - PLEASE review detailed and final reports thoroughly  -       DG Abd Portable 1V  Result Date: 03/26/2021 CLINICAL DATA:  61 year old female with history of nausea and vomiting. EXAM: PORTABLE ABDOMEN - 1 VIEW COMPARISON:  Abdominal radiograph 06/16/2019. FINDINGS: Gas and stool are seen scattered throughout the colon extending to the level of the distal rectum. No pathologic distension of small bowel is noted. No gross evidence of pneumoperitoneum. Orthopedic fixation hardware in the lumbar spine incidentally noted. IMPRESSION: 1. Nonobstructive bowel gas pattern. 2. No pneumoperitoneum. Electronically Signed   By: Trudie Reedaniel  Entrikin M.D.   On: 03/26/2021 06:48     Today   Subjective    Shakirah Luretha RuedMunn today has no headache,no chest abdominal pain,no new weakness tingling or numbness, mild chronic nausea, feels much better wants to go home today.     Objective   Blood pressure (!) 144/67, pulse 94, temperature 98.3 F (36.8 C), temperature source Oral, resp. rate 17, height 5\' 6"  (1.676 m), weight 89.4 kg, SpO2 97 %.   Intake/Output Summary (Last 24 hours) at 03/26/2021 1200 Last data filed at 03/26/2021 0344 Gross per 24 hour  Intake 260.29 ml  Output 1225 ml  Net -964.71 ml    Exam  Awake Alert, No new F.N deficits,    [place].AT,PERRAL Supple Neck,   Symmetrical Chest  wall movement, Good air movement bilaterally, CTAB RRR,No Gallops,   +ve B.Sounds, Abd Soft, Non tender,  No Cyanosis, Clubbing or edema    Data Review   CBC w Diff:  Lab Results  Component Value Date   WBC 5.7 03/26/2021   HGB 14.4 03/26/2021   HCT 42.5 03/26/2021   PLT 338 03/26/2021   LYMPHOPCT 11 03/26/2021   MONOPCT 2 03/26/2021   EOSPCT 0 03/26/2021   BASOPCT 0 03/26/2021    CMP:  Lab Results  Component Value Date   NA 138 03/26/2021   K 3.6 03/26/2021   CL 107 03/26/2021   CO2 20 (L) 03/26/2021   BUN 6 03/26/2021   CREATININE 0.71 03/26/2021   PROT 8.1 03/26/2021   ALBUMIN 3.9 03/26/2021   BILITOT 0.6 03/26/2021   ALKPHOS 87 03/26/2021   AST 20 03/26/2021   ALT 20 03/26/2021  .   Total Time in preparing paper work, data evaluation and todays exam - 35 minutes  Susa RaringPrashant Kiyaan Haq M.D on 03/26/2021 at 12:00 PM  [facility]

## 2021-03-26 NOTE — Evaluation (Signed)
Occupational Therapy Evaluation Patient Details Name: Kristen Ramos MRN: [id number] DOB: May 10, 1960 Today's Date: 03/26/2021   History of Present Illness Pt is a 61 y.o. F who presents 03/25/2021 with intractable nausea/vomiting. Significant PMH: chronic back pain, GERD, rheumatoid arthritis on chronic immunosuppressive therapy AVN methotrexate, chronic tobacco use.   Clinical Impression   Andrell reports being indep PTA, she drives but does not work. She lives in a 2 level home (bedroom upstairs) with family and a friend, they can assist 24/7 at d/c if needed. Evaluation was significantly limited by pt malaise and nausea that is exacerbated by movement. Overall she complete bed mobility with increased time, and 1x sit<>stand with min A and declined further mobility attempts. Pt became pallor and requested to lay back down. Due to impairments and symptoms above she currently requires up to mod A for ADLs and significantly increased time. Pt will benefit from OT acutely. Anticipate pt to progress well once she is less nauseous, recommend home with HHOT pending pts ability to progress towards independence while in the hospital.     Recommendations for follow up therapy are one component of a multi-disciplinary discharge planning process, led by the attending physician.  Recommendations may be updated based on patient status, additional functional criteria and insurance authorization.   Follow Up Recommendations  Home health OT (Pending pt progress acutely)    Assistance Recommended at Discharge Frequent or constant Supervision/Assistance  Patient can return home with the following A little help with walking and/or transfers;A little help with bathing/dressing/bathroom;Assist for transportation;Help with stairs or ramp for entrance;Assistance with cooking/housework    Functional Status Assessment  Patient has had a recent decline in their functional status and demonstrates the ability to make  significant improvements in function in a reasonable and predictable amount of time.  Equipment Recommendations  Tub/shower seat    Recommendations for Other Services       Precautions / Restrictions Precautions Precautions: Fall Restrictions Weight Bearing Restrictions: No      Mobility Bed Mobility Overal bed mobility: Needs Assistance Bed Mobility: Supine to Sit;Sit to Supine     Supine to sit: Supervision Sit to supine: Supervision   General bed mobility comments: increased time    Transfers Overall transfer level: Needs assistance Equipment used: 1 person hand held assist Transfers: Sit to/from Stand Sit to Stand: Min assist           General transfer comment: 1x with minimal hip clearance, pt declining further due to feeling nauseous      Balance Overall balance assessment: Needs assistance Sitting-balance support: Feet supported Sitting balance-Leahy Scale: Fair     Standing balance support: No upper extremity supported;During functional activity Standing balance-Leahy Scale: Poor                             ADL either performed or assessed with clinical judgement   ADL Overall ADL's : Needs assistance/impaired Eating/Feeding: Independent;Bed level   Grooming: Set up;Sitting   Upper Body Bathing: Set up;Sitting   Lower Body Bathing: Minimal assistance;Sit to/from stand   Upper Body Dressing : Set up;Sitting   Lower Body Dressing: Moderate assistance;Sit to/from stand   Toilet Transfer: Minimal assistance;Ambulation   Toileting- Clothing Manipulation and Hygiene: Min guard;Sitting/lateral lean       Functional mobility during ADLs: Minimal assistance General ADL Comments: pt limited by nausea that is exacerbated by movement. incrased assistance required for all tasks due to general malaise. increased  time required. gentle encouragement needed.     Vision Baseline Vision/History: 0 No visual deficits Ability to See in  Adequate Light: 0 Adequate Patient Visual Report: No change from baseline Vision Assessment?: No apparent visual deficits            Pertinent Vitals/Pain Pain Assessment: Faces Faces Pain Scale: Hurts little more Pain Location: generalized, naueous Pain Descriptors / Indicators: Tender;Sore Pain Intervention(s): Limited activity within patient's tolerance;Monitored during session     Hand Dominance Right   Extremity/Trunk Assessment Upper Extremity Assessment Upper Extremity Assessment: Overall WFL for tasks assessed;Generalized weakness (poor effort given this date, movement exacerbating pt's symtoms)   Lower Extremity Assessment Lower Extremity Assessment: Defer to PT evaluation   Cervical / Trunk Assessment Cervical / Trunk Assessment: Normal   Communication Communication Communication: No difficulties   Cognition Arousal/Alertness: Awake/alert;Lethargic Behavior During Therapy: Flat affect Overall Cognitive Status: Difficult to assess               General Comments: pt only giving short one word answers to some questions this date, otherwise shutting eyes and not answering this therapist. Difficult to fully assess cogntion, likely heavily influenced by feeling poor and nauseous     General Comments  VSS on RA, pt initally stating she did not feel nauseous. After minimal movement pt became palor and nauseous            Home Living Family/patient expects to be discharged to:: Private residence Living Arrangements: Other relatives Available Help at Discharge: Friend(s) Type of Home: House Home Access: Level entry     Home Layout: Two level Alternate Level Stairs-Number of Steps: 14   Bathroom Shower/Tub: Teacher, early years/pre: Standard     Home Equipment: Conservation officer, nature (2 wheels)          Prior Functioning/Environment Prior Level of Function : Independent/Modified Independent             Mobility Comments: no AD ADLs  Comments: drives, does not work        Secretary/administrator Problem List: Decreased activity tolerance;Pain      OT Treatment/Interventions: Self-care/ADL training;Therapeutic activities;Therapeutic exercise;DME and/or AE instruction    OT Goals(Current goals can be found in the care plan section) Acute Rehab OT Goals Patient Stated Goal: to feel better OT Goal Formulation: With patient Time For Goal Achievement: 04/09/21 Potential to Achieve Goals: Good ADL Goals Pt/caregiver will Perform Home Exercise Program: Increased strength;Both right and left upper extremity;With written HEP provided Additional ADL Goal #1: Pt will indep complete BADLs  OT Frequency: Min 2X/week       AM-PAC OT "6 Clicks" Daily Activity     Outcome Measure Help from another person eating meals?: None Help from another person taking care of personal grooming?: A Little Help from another person toileting, which includes using toliet, bedpan, or urinal?: A Little Help from another person bathing (including washing, rinsing, drying)?: A Little Help from another person to put on and taking off regular upper body clothing?: A Little Help from another person to put on and taking off regular lower body clothing?: A Little 6 Click Score: 19   End of Session Nurse Communication: Mobility status  Activity Tolerance: Patient tolerated treatment well Patient left: in bed;with call bell/phone within reach;with bed alarm set  OT Visit Diagnosis: Muscle weakness (generalized) (M62.81)                Time: [id number] OT Time Calculation (min): 12 min Charges:  OT General Charges $OT Visit: 1 Visit OT Evaluation $OT Eval Moderate Complexity: 1 Mod  Izetta Sakamoto A Chelsey Kimberley 03/26/2021, 11:07 AM

## 2021-03-26 NOTE — Plan of Care (Signed)

## 2021-03-26 NOTE — Progress Notes (Signed)
Patient vomited 50ml of dark brown emesis. Dr. Arlean Hopping notified. New order received for gastroccult.

## 2021-07-23 ENCOUNTER — Other Ambulatory Visit: Payer: Self-pay

## 2021-07-23 ENCOUNTER — Encounter (HOSPITAL_COMMUNITY): Payer: Self-pay | Admitting: Emergency Medicine

## 2021-07-23 ENCOUNTER — Emergency Department (HOSPITAL_COMMUNITY)
Admission: EM | Admit: 2021-07-23 | Discharge: 2021-07-23 | Disposition: A | Discharge status: Home / Self Care | Payer: Medicare (Managed Care) | Attending: Emergency Medicine | Admitting: Emergency Medicine

## 2021-07-23 DIAGNOSIS — R1115 Cyclical vomiting syndrome unrelated to migraine: Secondary | ICD-10-CM | POA: Insufficient documentation

## 2021-07-23 DIAGNOSIS — R451 Restlessness and agitation: Secondary | ICD-10-CM | POA: Diagnosis not present

## 2021-07-23 DIAGNOSIS — R112 Nausea with vomiting, unspecified: Secondary | ICD-10-CM

## 2021-07-23 DIAGNOSIS — R1110 Vomiting, unspecified: Secondary | ICD-10-CM | POA: Diagnosis present

## 2021-07-23 DIAGNOSIS — R456 Violent behavior: Secondary | ICD-10-CM | POA: Diagnosis not present

## 2021-07-23 LAB — I-STAT CHEM 8, ED
BUN: 9 mg/dL (ref 6–20)
Calcium, Ion: 1.12 mmol/L — ABNORMAL LOW (ref 1.15–1.40)
Chloride: 110 mmol/L (ref 98–111)
Creatinine, Ser: 0.7 mg/dL (ref 0.44–1.00)
Glucose, Bld: 101 mg/dL — ABNORMAL HIGH (ref 70–99)
HCT: 44 % (ref 36.0–46.0)
Hemoglobin: 15 g/dL (ref 12.0–15.0)
Potassium: 4 mmol/L (ref 3.5–5.1)
Sodium: 141 mmol/L (ref 135–145)
TCO2: 18 mmol/L — ABNORMAL LOW (ref 22–32)

## 2021-07-23 LAB — COMPREHENSIVE METABOLIC PANEL
ALT: 22 U/L (ref 0–44)
AST: 18 U/L (ref 15–41)
Albumin: 3.7 g/dL (ref 3.5–5.0)
Alkaline Phosphatase: 82 U/L (ref 38–126)
Anion gap: 10 (ref 5–15)
BUN: 9 mg/dL (ref 6–20)
CO2: 19 mmol/L — ABNORMAL LOW (ref 22–32)
Calcium: 9.4 mg/dL (ref 8.9–10.3)
Chloride: 109 mmol/L (ref 98–111)
Creatinine, Ser: 0.93 mg/dL (ref 0.44–1.00)
GFR, Estimated: >60 mL/min (ref >60)
Glucose, Bld: 101 mg/dL — ABNORMAL HIGH (ref 70–99)
Potassium: 4 mmol/L (ref 3.5–5.1)
Sodium: 138 mmol/L (ref 135–145)
Total Bilirubin: 0.3 mg/dL (ref 0.3–1.2)
Total Protein: 7.7 g/dL (ref 6.5–8.1)

## 2021-07-23 LAB — CBC WITH DIFFERENTIAL/PLATELET
Abs Immature Granulocytes: 0.02 10*3/uL (ref 0.00–0.07)
Basophils Absolute: 0.1 10*3/uL (ref 0.0–0.1)
Basophils Relative: 1 %
Eosinophils Absolute: 0.2 10*3/uL (ref 0.0–0.5)
Eosinophils Relative: 3 %
HCT: 43.9 % (ref 36.0–46.0)
Hemoglobin: 14.5 g/dL (ref 12.0–15.0)
Immature Granulocytes: 0 %
Lymphocytes Relative: 32 %
Lymphs Abs: 3 10*3/uL (ref 0.7–4.0)
MCH: 30.9 pg (ref 26.0–34.0)
MCHC: 33 g/dL (ref 30.0–36.0)
MCV: 93.4 fL (ref 80.0–100.0)
Monocytes Absolute: 0.6 10*3/uL (ref 0.1–1.0)
Monocytes Relative: 6 %
Neutro Abs: 5.4 10*3/uL (ref 1.7–7.7)
Neutrophils Relative %: 58 %
Platelets: 332 10*3/uL (ref 150–400)
RBC: 4.7 MIL/uL (ref 3.87–5.11)
RDW: 13.3 % (ref 11.5–15.5)
WBC: 9.3 10*3/uL (ref 4.0–10.5)
nRBC: 0 % (ref 0.0–0.2)

## 2021-07-23 MED ORDER — ONDANSETRON HCL 4 MG/2ML IJ SOLN
2.0000 mg | Freq: Once | INTRAMUSCULAR | Status: AC
Start: 2021-07-23 — End: 2021-07-23
  Administered 2021-07-23: 2 mg via INTRAVENOUS
  Filled 2021-07-23: qty 2

## 2021-07-23 MED ORDER — DIPHENHYDRAMINE HCL 50 MG/ML IJ SOLN
12.5000 mg | Freq: Once | INTRAMUSCULAR | Status: AC
  Administered 2021-07-23: 12.5 mg via INTRAVENOUS
  Filled 2021-07-23: qty 1

## 2021-07-23 MED ORDER — SODIUM CHLORIDE 0.9 % IV BOLUS
500.0000 mL | Freq: Once | INTRAVENOUS | Status: AC
  Administered 2021-07-23: 500 mL via INTRAVENOUS

## 2021-07-23 MED ORDER — HALOPERIDOL LACTATE 5 MG/ML IJ SOLN
5.0000 mg | Freq: Once | INTRAMUSCULAR | Status: AC
  Administered 2021-07-23: 5 mg via INTRAVENOUS
  Filled 2021-07-23: qty 1

## 2021-07-23 MED ORDER — KETOROLAC TROMETHAMINE 30 MG/ML IJ SOLN
30.0000 mg | Freq: Once | INTRAMUSCULAR | Status: AC
  Administered 2021-07-23: 30 mg via INTRAVENOUS
  Filled 2021-07-23: qty 1

## 2021-07-23 MED ORDER — METOCLOPRAMIDE HCL 10 MG PO TABS: 10.0000 mg | ORAL_TABLET | Freq: Three times a day (TID) | ORAL | 0 refills | Status: DC | PRN

## 2021-07-23 NOTE — ED Provider Notes (Addendum)
?[facility] ?Provider Note ? ? ?CSN: [id number] ?Arrival date & time: 07/23/21  0454 ? ?  ? ?History ? ?Chief Complaint  ?Patient presents with  ? Diarrhea  ? ? ?Kristen Ramos is a 61 y.o. female. ? ?The history is provided by the patient.  ?Emesis ?Severity:  Severe (reportedly) ?Duration:  1 week ?Timing:  Intermittent ?Quality:  Stomach contents ?Progression:  Unchanged ?Chronicity:  Recurrent ?Recent urination:  Normal ?Relieved by:  Nothing ?Worsened by:  Nothing ?Ineffective treatments:  None tried ?Associated symptoms: no abdominal pain, no diarrhea and no fever   ?Risk factors: no alcohol use   ?Patient with cyclic vomiting syndrome presents with one week of emesis.  Denies diarrhea to me, states she is here for her typical vomiting.  Is yelling at staff and flailing in the bed.  Patient is screaming at EDP  "where is Dr. Marca AnconaKarki? Is this [facility]?"   ?  ? ?Home Medications ?Prior to Admission medications   ?Medication Sig Start Date End Date Taking? Authorizing Provider  ?amitriptyline (ELAVIL) 25 MG tablet Take 25 mg by mouth daily. 05/21/19   [provider]  ?cetirizine (ZYRTEC) 10 MG tablet Take 10 mg by mouth daily. 05/26/19   [provider]  ?famotidine (PEPCID) 40 MG tablet Take 40 mg by mouth 2 (two) times daily. 03/31/19   [provider]  ?folic acid (FOLVITE) 1 MG tablet Take 1 mg by mouth daily. 04/01/19   [provider]  ?HORIZANT 600 MG TBCR Take 1 tablet by mouth 2 (two) times daily. 06/01/19   [provider]  ?meclizine (ANTIVERT) 25 MG tablet Take 1 tablet (25 mg total) by mouth 3 (three) times daily as needed for dizziness or nausea. 06/22/19   Marguerita MerlesSheikh, Omair Latif, DO  ?methotrexate (RHEUMATREX) 2.5 MG tablet Take 15 mg by mouth once a week. 03/31/19   [provider]  ?ondansetron (ZOFRAN) 4 MG tablet Take 1 tablet (4 mg total) by mouth every 6 (six) hours as needed for nausea or refractory nausea /  vomiting. 03/26/21   Leroy SeaSingh, Prashant K, MD  ?oxyCODONE (ROXICODONE) 15 MG immediate release tablet Take 15 mg by mouth 4 (four) times daily as needed for pain.  02/03/18   [provider]  ?pantoprazole (PROTONIX) 40 MG tablet Take 1 tablet (40 mg total) by mouth daily. 06/22/19   Marguerita MerlesSheikh, Omair Latif, DO  ?polyethylene glycol (MIRALAX / GLYCOLAX) 17 g packet Take 17 g by mouth daily. ?Patient taking differently: Take 17 g by mouth daily as needed for mild constipation. 06/23/19   Merlene LaughterSheikh, Omair Latif, DO  ?scopolamine (TRANSDERM-SCOP) 1 MG/3DAYS Place 1 patch (1.5 mg total) onto the skin every 3 (three) days. ?Patient not taking: Reported on 03/25/2021 06/24/19   Marguerita MerlesSheikh, Omair Latif, DO  ?tiZANidine (ZANAFLEX) 4 MG tablet Take 4 mg by mouth 2 (two) times daily as needed for muscle spasms. 09/29/18   [provider]  ?   ? ?Allergies    ?Bee venom   ? ?Review of Systems   ?Review of Systems  ?Constitutional:  Negative for fever.  ?HENT:  Negative for facial swelling.   ?Eyes:  Negative for redness.  ?Respiratory:  Negative for wheezing and stridor.   ?Cardiovascular:  Negative for leg swelling.  ?Gastrointestinal:  Positive for vomiting. Negative for abdominal pain and diarrhea.  ?Neurological:  Negative for facial asymmetry.  ?All other systems reviewed and are negative. ? ?Physical Exam ?Updated Vital Signs ?BP (!) 107/59 (  BP Location: Left Arm)   Pulse 85   Temp 98.5 ?F (36.9 ?C)   Resp 17   Ht 5\' 6"  (1.676 m)   Wt 89.4 kg   SpO2 98%   BMI 31.81 kg/m?  ?Physical Exam ?Vitals and nursing note reviewed.  ?Constitutional:   ?   Appearance: She is not diaphoretic.  ?HENT:  ?   Head: Normocephalic and atraumatic.  ?   Nose: Nose normal.  ?   Mouth/Throat:  ?   Mouth: Mucous membranes are moist.  ?   Pharynx: Oropharynx is clear.  ?   Comments: Is not vomiting but is making loud noises and then spitting scant saliva into emesis bag  ?Eyes:  ?   Conjunctiva/sclera: Conjunctivae normal.  ?   Pupils:  Pupils are equal, round, and reactive to light.  ?Cardiovascular:  ?   Rate and Rhythm: Normal rate and regular rhythm.  ?   Pulses: Normal pulses.  ?   Heart sounds: Normal heart sounds.  ?Pulmonary:  ?   Effort: Pulmonary effort is normal.  ?   Breath sounds: Normal breath sounds.  ?Abdominal:  ?   General: Bowel sounds are normal.  ?   Palpations: Abdomen is soft. There is no mass.  ?   Tenderness: There is no abdominal tenderness. There is no guarding or rebound.  ?   Hernia: No hernia is present.  ?Musculoskeletal:     ?   General: Normal range of motion.  ?   Cervical back: Normal range of motion and neck supple.  ?Skin: ?   General: Skin is warm and dry.  ?   Capillary Refill: Capillary refill takes less than 2 seconds.  ?Neurological:  ?   General: No focal deficit present.  ?   Mental Status: She is alert.  ?   Deep Tendon Reflexes: Reflexes normal.  ?Psychiatric:     ?   Behavior: Behavior is agitated and aggressive.  ? ? ?ED Results / Procedures / Treatments   ?Labs ?(all labs ordered are listed, but only abnormal results are displayed) ?Results for orders placed or performed during the hospital encounter of 07/23/21  ?CBC with Differential  ?Result Value Ref Range  ? WBC 9.3 4.0 - 10.5 K/uL  ? RBC 4.70 3.87 - 5.11 MIL/uL  ? Hemoglobin 14.5 12.0 - 15.0 g/dL  ? HCT 43.9 36.0 - 46.0 %  ? MCV 93.4 80.0 - 100.0 fL  ? MCH 30.9 26.0 - 34.0 pg  ? MCHC 33.0 30.0 - 36.0 g/dL  ? RDW 13.3 11.5 - 15.5 %  ? Platelets 332 150 - 400 K/uL  ? nRBC 0.0 0.0 - 0.2 %  ? Neutrophils Relative % 58 %  ? Neutro Abs 5.4 1.7 - 7.7 K/uL  ? Lymphocytes Relative 32 %  ? Lymphs Abs 3.0 0.7 - 4.0 K/uL  ? Monocytes Relative 6 %  ? Monocytes Absolute 0.6 0.1 - 1.0 K/uL  ? Eosinophils Relative 3 %  ? Eosinophils Absolute 0.2 0.0 - 0.5 K/uL  ? Basophils Relative 1 %  ? Basophils Absolute 0.1 0.0 - 0.1 K/uL  ? Immature Granulocytes 0 %  ? Abs Immature Granulocytes 0.02 0.00 - 0.07 K/uL  ?Comprehensive metabolic panel  ?Result Value Ref Range   ? Sodium 138 135 - 145 mmol/L  ? Potassium 4.0 3.5 - 5.1 mmol/L  ? Chloride 109 98 - 111 mmol/L  ? CO2 19 (L) 22 - 32 mmol/L  ? Glucose, Bld 101 (  H) 70 - 99 mg/dL  ? BUN 9 6 - 20 mg/dL  ? Creatinine, Ser 0.93 0.44 - 1.00 mg/dL  ? Calcium 9.4 8.9 - 10.3 mg/dL  ? Total Protein 7.7 6.5 - 8.1 g/dL  ? Albumin 3.7 3.5 - 5.0 g/dL  ? AST 18 15 - 41 U/L  ? ALT 22 0 - 44 U/L  ? Alkaline Phosphatase 82 38 - 126 U/L  ? Total Bilirubin 0.3 0.3 - 1.2 mg/dL  ? GFR, Estimated >60 >60 mL/min  ? Anion gap 10 5 - 15  ?I-stat chem 8, ED (not at [facility] or [facility])  ?Result Value Ref Range  ? Sodium 141 135 - 145 mmol/L  ? Potassium 4.0 3.5 - 5.1 mmol/L  ? Chloride 110 98 - 111 mmol/L  ? BUN 9 6 - 20 mg/dL  ? Creatinine, Ser 0.70 0.44 - 1.00 mg/dL  ? Glucose, Bld 101 (H) 70 - 99 mg/dL  ? Calcium, Ion 1.12 (L) 1.15 - 1.40 mmol/L  ? TCO2 18 (L) 22 - 32 mmol/L  ? Hemoglobin 15.0 12.0 - 15.0 g/dL  ? HCT 44.0 36.0 - 46.0 %  ? ?No results found. ? ?Radiology ?No results found. ? ?Procedures ?Procedures  ? ? ?Medications Ordered in ED ?Medications  ?haloperidol lactate (HALDOL) injection 5 mg (5 mg Intravenous Given 07/23/21 0532)  ?sodium chloride 0.9 % bolus 500 mL (500 mLs Intravenous New Bag/Given 07/23/21 0532)  ?ketorolac (TORADOL) 30 MG/ML injection 30 mg (30 mg Intravenous Given 07/23/21 0541)  ?diphenhydrAMINE (BENADRYL) injection 12.5 mg (12.5 mg Intravenous Given 07/23/21 0541)  ? ? ?ED Course/ Medical Decision Making/ A&P ?  ?                        ?Medical Decision Making ?Patient with cyclic vomiting syndrome presents with vomiting x 1 week by her report ? ?Amount and/or Complexity of Data Reviewed ?External Data Reviewed: notes. ?   Details: previous ED notes reviewed ?Labs: ordered. ?   Details: all labs reviewed: normal white count 9.3.  this is interesting, if patient had been vomiting non-stop for 1 week as she reports there would be an elevation of the white count due to demargination. there is not.  Hemoglobin is normal 14.5 and is  not above the patient;s baseline, no signs of dehydration.  Normal platelet count.  Sodium is normal 138.  Potassium is also normal 4.0, which strongly argues against severe emesis as it would be low.  also norm

## 2021-07-23 NOTE — ED Notes (Signed)
Pt refuses to have the PO challenge, states she wants to be discharge from this ED to go to another one. Pt is resting on bed no sign and symptoms of nausea, vomiting or diarrhea during this visit on the ED.   ?

## 2021-07-23 NOTE — ED Triage Notes (Signed)
Pt arrive by EMS from home for c/o Diarrhea, Nausea, vomiting. ?

## 2021-10-20 ENCOUNTER — Other Ambulatory Visit: Payer: Self-pay

## 2021-10-20 ENCOUNTER — Inpatient Hospital Stay (HOSPITAL_COMMUNITY)
Admission: EM | Admit: 2021-10-20 | Discharge: 2021-10-29 | DRG: 394 | Disposition: A | Discharge status: Skilled Nursing Facility | Payer: Medicare (Managed Care) | Attending: Family Medicine | Admitting: Family Medicine

## 2021-10-20 DIAGNOSIS — M48061 Spinal stenosis, lumbar region without neurogenic claudication: Secondary | ICD-10-CM | POA: Diagnosis present

## 2021-10-20 DIAGNOSIS — G9340 Encephalopathy, unspecified: Secondary | ICD-10-CM | POA: Diagnosis present

## 2021-10-20 DIAGNOSIS — R569 Unspecified convulsions: Secondary | ICD-10-CM | POA: Diagnosis present

## 2021-10-20 DIAGNOSIS — Z91030 Bee allergy status: Secondary | ICD-10-CM

## 2021-10-20 DIAGNOSIS — Z79899 Other long term (current) drug therapy: Secondary | ICD-10-CM

## 2021-10-20 DIAGNOSIS — Z90710 Acquired absence of both cervix and uterus: Secondary | ICD-10-CM

## 2021-10-20 DIAGNOSIS — J309 Allergic rhinitis, unspecified: Secondary | ICD-10-CM | POA: Diagnosis present

## 2021-10-20 DIAGNOSIS — D84821 Immunodeficiency due to drugs: Secondary | ICD-10-CM | POA: Diagnosis present

## 2021-10-20 DIAGNOSIS — K429 Umbilical hernia without obstruction or gangrene: Secondary | ICD-10-CM | POA: Diagnosis present

## 2021-10-20 DIAGNOSIS — M549 Dorsalgia, unspecified: Secondary | ICD-10-CM | POA: Diagnosis present

## 2021-10-20 DIAGNOSIS — R9401 Abnormal electroencephalogram [EEG]: Secondary | ICD-10-CM | POA: Diagnosis present

## 2021-10-20 DIAGNOSIS — R1115 Cyclical vomiting syndrome unrelated to migraine: Principal | ICD-10-CM | POA: Diagnosis present

## 2021-10-20 DIAGNOSIS — R42 Dizziness and giddiness: Secondary | ICD-10-CM | POA: Diagnosis not present

## 2021-10-20 DIAGNOSIS — M069 Rheumatoid arthritis, unspecified: Secondary | ICD-10-CM | POA: Diagnosis present

## 2021-10-20 DIAGNOSIS — Z7960 Long term (current) use of unspecified immunomodulators and immunosuppressants: Secondary | ICD-10-CM

## 2021-10-20 DIAGNOSIS — E872 Acidosis, unspecified: Secondary | ICD-10-CM | POA: Diagnosis present

## 2021-10-20 DIAGNOSIS — E669 Obesity, unspecified: Secondary | ICD-10-CM | POA: Diagnosis present

## 2021-10-20 DIAGNOSIS — E876 Hypokalemia: Secondary | ICD-10-CM | POA: Diagnosis not present

## 2021-10-20 DIAGNOSIS — R112 Nausea with vomiting, unspecified: Secondary | ICD-10-CM | POA: Diagnosis present

## 2021-10-20 DIAGNOSIS — K219 Gastro-esophageal reflux disease without esophagitis: Secondary | ICD-10-CM | POA: Diagnosis present

## 2021-10-20 DIAGNOSIS — E860 Dehydration: Secondary | ICD-10-CM | POA: Diagnosis present

## 2021-10-20 DIAGNOSIS — Z6831 Body mass index (BMI) 31.0-31.9, adult: Secondary | ICD-10-CM

## 2021-10-20 DIAGNOSIS — F17210 Nicotine dependence, cigarettes, uncomplicated: Secondary | ICD-10-CM | POA: Diagnosis present

## 2021-10-20 DIAGNOSIS — G8929 Other chronic pain: Secondary | ICD-10-CM | POA: Diagnosis present

## 2021-10-20 LAB — CBC WITH DIFFERENTIAL/PLATELET
Abs Immature Granulocytes: 0.02 10*3/uL (ref 0.00–0.07)
Basophils Absolute: 0 10*3/uL (ref 0.0–0.1)
Basophils Relative: 1 %
Eosinophils Absolute: 0 10*3/uL (ref 0.0–0.5)
Eosinophils Relative: 1 %
HCT: 42.1 % (ref 36.0–46.0)
Hemoglobin: 14.5 g/dL (ref 12.0–15.0)
Immature Granulocytes: 0 %
Lymphocytes Relative: 22 %
Lymphs Abs: 1.1 10*3/uL (ref 0.7–4.0)
MCH: 31.3 pg (ref 26.0–34.0)
MCHC: 34.4 g/dL (ref 30.0–36.0)
MCV: 90.9 fL (ref 80.0–100.0)
Monocytes Absolute: 0.3 10*3/uL (ref 0.1–1.0)
Monocytes Relative: 6 %
Neutro Abs: 3.6 10*3/uL (ref 1.7–7.7)
Neutrophils Relative %: 70 %
Platelets: 338 10*3/uL (ref 150–400)
RBC: 4.63 MIL/uL (ref 3.87–5.11)
RDW: 13.9 % (ref 11.5–15.5)
WBC: 5.2 10*3/uL (ref 4.0–10.5)
nRBC: 0 % (ref 0.0–0.2)

## 2021-10-20 LAB — COMPREHENSIVE METABOLIC PANEL
ALT: 14 U/L (ref 0–44)
AST: 22 U/L (ref 15–41)
Albumin: 3.9 g/dL (ref 3.5–5.0)
Alkaline Phosphatase: 86 U/L (ref 38–126)
Anion gap: 14 (ref 5–15)
BUN: <5 mg/dL — ABNORMAL LOW (ref 6–20)
CO2: 17 mmol/L — ABNORMAL LOW (ref 22–32)
Calcium: 9.5 mg/dL (ref 8.9–10.3)
Chloride: 107 mmol/L (ref 98–111)
Creatinine, Ser: 0.82 mg/dL (ref 0.44–1.00)
GFR, Estimated: >60 mL/min (ref >60)
Glucose, Bld: 136 mg/dL — ABNORMAL HIGH (ref 70–99)
Potassium: 3.4 mmol/L — ABNORMAL LOW (ref 3.5–5.1)
Sodium: 138 mmol/L (ref 135–145)
Total Bilirubin: 0.7 mg/dL (ref 0.3–1.2)
Total Protein: 8.2 g/dL — ABNORMAL HIGH (ref 6.5–8.1)

## 2021-10-20 LAB — PHOSPHORUS: Phosphorus: 1.2 mg/dL — ABNORMAL LOW (ref 2.5–4.6)

## 2021-10-20 LAB — MAGNESIUM: Magnesium: 1.9 mg/dL (ref 1.7–2.4)

## 2021-10-20 MED ORDER — LORAZEPAM 2 MG/ML IJ SOLN
1.0000 mg | Freq: Once | INTRAMUSCULAR | Status: AC
Start: 2021-10-20 — End: 2021-10-20
  Administered 2021-10-20: 1 mg via INTRAVENOUS
  Filled 2021-10-20: qty 1

## 2021-10-20 MED ORDER — ONDANSETRON 4 MG PO TBDP
8.0000 mg | ORAL_TABLET | Freq: Once | ORAL | Status: AC
  Administered 2021-10-20: 8 mg via ORAL
  Filled 2021-10-20: qty 2

## 2021-10-20 MED ORDER — PANTOPRAZOLE SODIUM 40 MG IV SOLR
40.0000 mg | Freq: Once | INTRAVENOUS | Status: AC
  Administered 2021-10-20: 40 mg via INTRAVENOUS
  Filled 2021-10-20: qty 10

## 2021-10-20 MED ORDER — HALOPERIDOL LACTATE 5 MG/ML IJ SOLN
2.5000 mg | Freq: Once | INTRAMUSCULAR | Status: AC
Start: 2021-10-20 — End: 2021-10-20
  Administered 2021-10-20: 2.5 mg via INTRAVENOUS
  Filled 2021-10-20: qty 1

## 2021-10-20 MED ORDER — SUCRALFATE 1 G PO TABS
1.0000 g | ORAL_TABLET | Freq: Once | ORAL | Status: AC
  Administered 2021-10-20: 1 g via ORAL
  Filled 2021-10-20: qty 1

## 2021-10-20 MED ORDER — MECLIZINE HCL 25 MG PO TABS
50.0000 mg | ORAL_TABLET | Freq: Once | ORAL | Status: AC
  Administered 2021-10-20: 50 mg via ORAL
  Filled 2021-10-20: qty 2

## 2021-10-20 MED ORDER — MECLIZINE HCL 25 MG PO TABS: 25.0000 mg | ORAL_TABLET | Freq: Three times a day (TID) | ORAL | 0 refills | Status: DC | PRN

## 2021-10-20 NOTE — Discharge Instructions (Signed)
We saw you in the ER for nausea, vomiting and vertigo.  All the results in the ER are normal, labs and imaging.  Take the medications that are prescribed.  Please return to the ER if your symptoms worsen.

## 2021-10-20 NOTE — ED Notes (Signed)
Dr. Rosalia Hammers at the bedside to reassess pt

## 2021-10-20 NOTE — ED Notes (Signed)
Pt appears to be sleeping, laying on her left side, even RR and unlabored, NAD noted, call bell in reach, side rails up x2 for safety, care on going, will continue to monitor.

## 2021-10-20 NOTE — ED Provider Notes (Signed)
[facility] EMERGENCY DEPARTMENT Provider Note   CSN: [id number] Arrival date & time: 10/20/21  1004     History {Add pertinent medical, surgical, social history, OB history to HPI:1} Chief Complaint  Patient presents with   Abdominal Pain    Kristen Ramos is a 61 y.o. female.  HPI    61 y.o. 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 Authorizing Provider  amitriptyline (ELAVIL) 25 MG tablet Take 25 mg by mouth daily. 05/21/19   [provider]  cetirizine (ZYRTEC) 10 MG tablet Take 10 mg by mouth daily. 05/26/19   [provider]  famotidine (PEPCID) 40 MG tablet Take 40 mg by mouth 2 (two) times daily. 03/31/19   [provider]  folic acid (FOLVITE) 1 MG tablet Take 1 mg by mouth daily. 04/01/19   [provider]  HORIZANT 600 MG TBCR Take 1 tablet by mouth 2 (two) times daily. 06/01/19   [provider]  meclizine (ANTIVERT) 25 MG tablet Take 1 tablet (25 mg total) by mouth 3 (three) times daily as needed for dizziness or nausea. 06/22/19   Marguerita Merles Latif, DO  methotrexate (RHEUMATREX) 2.5 MG tablet Take 15 mg by mouth once a week. 03/31/19   [provider]  metoCLOPramide (REGLAN) 10 MG tablet Take 1 tablet (10 mg total) by mouth every 8 (eight) hours as needed for vomiting (nausea/headache). 07/23/21   Palumbo, April, MD  ondansetron (ZOFRAN) 4 MG tablet Take 1 tablet (4 mg total) by mouth every 6 (six) hours as needed for nausea or refractory nausea / vomiting. 03/26/21   Leroy Sea, MD  oxyCODONE (ROXICODONE) 15 MG immediate release tablet Take 15 mg by mouth 4 (four)  times daily as needed for pain.  02/03/18   [provider]  pantoprazole (PROTONIX) 40 MG tablet Take 1 tablet (40 mg total) by mouth daily. 06/22/19   Sheikh, Kateri Mc Latif, DO  polyethylene glycol (MIRALAX / GLYCOLAX) 17 g packet Take 17 g by mouth daily. Patient taking differently: Take 17 g by mouth daily as needed for mild constipation. 06/23/19   Marguerita Merles Latif, DO  scopolamine (TRANSDERM-SCOP) 1 MG/3DAYS Place 1 patch (1.5 mg total) onto the skin every 3 (three) days. Patient not taking: Reported on 03/25/2021 06/24/19   Marguerita Merles Latif, DO  tiZANidine (ZANAFLEX) 4 MG tablet Take 4 mg by mouth 2 (two) times daily as needed for muscle spasms. 09/29/18   [provider]      Allergies    Bee venom    Review of Systems   Review of Systems  Physical Exam Updated Vital Signs BP (!) 145/82   Pulse 69   Temp 98.6 F (37 C)   Resp 18   SpO2 100%  Physical Exam  ED Results / Procedures / Treatments   Labs (all labs ordered are listed, but only abnormal results are displayed) Labs Reviewed  COMPREHENSIVE METABOLIC PANEL - Abnormal; Notable for the following components:      Result Value   Potassium 3.4 (*)    CO2 17 (*)    Glucose, Bld 136 (*)    BUN <5 (*)    Total Protein 8.2 (*)  All other components within normal limits  PHOSPHORUS - Abnormal; Notable for the following components:   Phosphorus 1.2 (*)    All other components within normal limits  CBC WITH DIFFERENTIAL/PLATELET  MAGNESIUM    EKG EKG Interpretation  Date/Time:  Friday October 20 2021 10:54:22 EDT Ventricular Rate:  68 PR Interval:  144 QRS Duration: 100 QT Interval:  452 QTC Calculation: 481 R Axis:   64 Text Interpretation: Sinus rhythm Probable left atrial enlargement Borderline T abnormalities, anterior leads No acute changes No significant change since last tracing Confirmed by Derwood Kaplan [phone]) on 10/20/2021 11:43:46 AM  Radiology No results  found.  Procedures Procedures  {Document cardiac monitor, telemetry assessment procedure when appropriate:1}  Medications Ordered in ED Medications  meclizine (ANTIVERT) tablet 50 mg (has no administration in time range)  LORazepam (ATIVAN) injection 1 mg (has no administration in time range)  haloperidol lactate (HALDOL) injection 2.5 mg (2.5 mg Intravenous Given 10/20/21 1116)  haloperidol lactate (HALDOL) injection 2.5 mg (2.5 mg Intravenous Given 10/20/21 1418)  ondansetron (ZOFRAN-ODT) disintegrating tablet 8 mg (8 mg Oral Given 10/20/21 1424)  sucralfate (CARAFATE) tablet 1 g (1 g Oral Given 10/20/21 1423)  pantoprazole (PROTONIX) injection 40 mg (40 mg Intravenous Given 10/20/21 1419)    ED Course/ Medical Decision Making/ A&P Clinical Course as of 10/20/21 1507  Fri Oct 20, 2021  1507 Patient reassessed. She states that the nausea and vomiting is better, vertigo is persistent [AN]    Clinical Course User Index [AN] Derwood Kaplan, MD                           Medical Decision Making Amount and/or Complexity of Data Reviewed Labs: ordered.  Risk Prescription drug management.   ***  {Document critical care time when appropriate:1} {Document review of labs and clinical decision tools ie heart score, Chads2Vasc2 etc:1}  {Document your independent review of radiology images, and any outside records:1} {Document your discussion with family members, caretakers, and with consultants:1} {Document social determinants of health affecting pt's care:1} {Document your decision making why or why not admission, treatments were needed:1} Final Clinical Impression(s) / ED Diagnoses Final diagnoses:  None    Rx / DC Orders ED Discharge Orders     None

## 2021-10-20 NOTE — ED Notes (Signed)
Pt was able to ambulate in the hallway with assistance. Pt only walked to the next room and the turned around. Pt reported no weakness, or dizziness. Pt was unstable when not holding on to the wall or staff.

## 2021-10-20 NOTE — ED Provider Notes (Addendum)
  Physical Exam  BP (!) 151/94   Pulse 66   Temp 98.6 F (37 C)   Resp 15   SpO2 100%   Physical Exam  Procedures  Procedures  ED Course / MDM   Clinical Course as of 10/20/21 1607  Fri Oct 20, 2021  1507 Patient reassessed. She states that the nausea and vomiting is better, vertigo is persistent [AN]    Clinical Course User Index [AN] Derwood Kaplan, MD   Medical Decision Making Amount and/or Complexity of Data Reviewed Labs: ordered.  Risk Prescription drug management.   61 yo female with dizziness.  Nonfocal neuro and has history of vertigo and has been out of antivert. Plan to ambulate patient and d/c with antivert and zofran if able to ambulate  RN reports that patient walked to bathroom. Patient continues somnolent on my exam She received haldol and ativan.  She has not had any vomiting. Plan d/c when more alert Discussed with Dr.Pickering who has assumed care      Margarita Grizzle, MD 10/20/21 2153    Margarita Grizzle, MD 10/20/21 2317    Margarita Grizzle, MD 10/20/21 2318

## 2021-10-20 NOTE — ED Triage Notes (Signed)
Pt bib [facility] from home c/o upper abd pain x2days. Hx of same every 5-97months. Pt normally takes meclizine, but has been out for 2 days. Pt has not been able to tolerate PO intake x2days. EMS gave 4mg  zofran, fent.   EMS vitals 160/90 BP 90 HR 20g Rt wrist 650cc LR

## 2021-10-20 NOTE — ED Notes (Signed)
Pt appears to be sleeping, even RR and unlabored, NAD noted, call bell in reach, side rails up x2 for safety, care on going, will continue to monitor. 

## 2021-10-20 NOTE — ED Notes (Signed)
Pt was reposition in bed for comfort, pt appears in a somnolence state, opens eyes to name, pt appears to be humming, does follow commands, when asked questions pt does back off to sleep. Even RR and unlabored, call bell within reach for assistance, side rails up x2 for safety, will continue to monitor.

## 2021-10-21 ENCOUNTER — Emergency Department (HOSPITAL_COMMUNITY): Payer: Medicare (Managed Care)

## 2021-10-21 ENCOUNTER — Encounter (HOSPITAL_COMMUNITY): Payer: Self-pay | Admitting: Family Medicine

## 2021-10-21 DIAGNOSIS — K219 Gastro-esophageal reflux disease without esophagitis: Secondary | ICD-10-CM

## 2021-10-21 DIAGNOSIS — R1115 Cyclical vomiting syndrome unrelated to migraine: Secondary | ICD-10-CM | POA: Diagnosis not present

## 2021-10-21 DIAGNOSIS — R112 Nausea with vomiting, unspecified: Secondary | ICD-10-CM

## 2021-10-21 DIAGNOSIS — M549 Dorsalgia, unspecified: Secondary | ICD-10-CM

## 2021-10-21 DIAGNOSIS — G8929 Other chronic pain: Secondary | ICD-10-CM

## 2021-10-21 LAB — BASIC METABOLIC PANEL
Anion gap: 12 (ref 5–15)
BUN: 7 mg/dL (ref 6–20)
CO2: 19 mmol/L — ABNORMAL LOW (ref 22–32)
Calcium: 9.3 mg/dL (ref 8.9–10.3)
Chloride: 103 mmol/L (ref 98–111)
Creatinine, Ser: 0.65 mg/dL (ref 0.44–1.00)
GFR, Estimated: >60 mL/min (ref >60)
Glucose, Bld: 122 mg/dL — ABNORMAL HIGH (ref 70–99)
Potassium: 4 mmol/L (ref 3.5–5.1)
Sodium: 134 mmol/L — ABNORMAL LOW (ref 135–145)

## 2021-10-21 LAB — URINALYSIS, ROUTINE W REFLEX MICROSCOPIC
Bacteria, UA: NONE SEEN
Bilirubin Urine: NEGATIVE
Glucose, UA: NEGATIVE mg/dL
Ketones, ur: 20 mg/dL — AB
Leukocytes,Ua: NEGATIVE
Nitrite: NEGATIVE
Protein, ur: 30 mg/dL — AB
Specific Gravity, Urine: 1.017 (ref 1.005–1.030)
pH: 5 (ref 5.0–8.0)

## 2021-10-21 LAB — PHOSPHORUS: Phosphorus: 3.5 mg/dL (ref 2.5–4.6)

## 2021-10-21 MED ORDER — SODIUM CHLORIDE 0.9 % IV SOLN
12.5000 mg | Freq: Four times a day (QID) | INTRAVENOUS | Status: DC | PRN
  Filled 2021-10-21: qty 0.5

## 2021-10-21 MED ORDER — MECLIZINE HCL 12.5 MG PO TABS
12.5000 mg | ORAL_TABLET | Freq: Three times a day (TID) | ORAL | Status: DC
  Administered 2021-10-21 – 2021-10-23 (×5): 12.5 mg via ORAL
  Filled 2021-10-21 (×8): qty 1

## 2021-10-21 MED ORDER — POTASSIUM PHOSPHATES 15 MMOLE/5ML IV SOLN
30.0000 mmol | Freq: Once | INTRAVENOUS | Status: AC
  Administered 2021-10-21: 30 mmol via INTRAVENOUS
  Filled 2021-10-21: qty 10

## 2021-10-21 MED ORDER — ONDANSETRON HCL 4 MG PO TABS: 4.0000 mg | ORAL_TABLET | Freq: Four times a day (QID) | ORAL | Status: DC | PRN

## 2021-10-21 MED ORDER — SODIUM CHLORIDE 0.9% FLUSH
3.0000 mL | Freq: Two times a day (BID) | INTRAVENOUS | Status: DC
  Administered 2021-10-21 – 2021-10-29 (×16): 3 mL via INTRAVENOUS

## 2021-10-21 MED ORDER — PANTOPRAZOLE SODIUM 40 MG IV SOLR
40.0000 mg | INTRAVENOUS | Status: DC
  Administered 2021-10-21 – 2021-10-26 (×6): 40 mg via INTRAVENOUS
  Filled 2021-10-21 (×6): qty 10

## 2021-10-21 MED ORDER — SENNOSIDES-DOCUSATE SODIUM 8.6-50 MG PO TABS: 1.0000 | ORAL_TABLET | Freq: Every evening | ORAL | Status: DC | PRN

## 2021-10-21 MED ORDER — ONDANSETRON HCL 4 MG/2ML IJ SOLN
4.0000 mg | Freq: Four times a day (QID) | INTRAMUSCULAR | Status: DC | PRN
  Administered 2021-10-22: 4 mg via INTRAVENOUS
  Filled 2021-10-21: qty 2

## 2021-10-21 MED ORDER — PANTOPRAZOLE SODIUM 40 MG PO TBEC: 40.0000 mg | DELAYED_RELEASE_TABLET | Freq: Every day | ORAL | Status: DC

## 2021-10-21 MED ORDER — LACTATED RINGERS IV SOLN: INTRAVENOUS | Status: DC

## 2021-10-21 MED ORDER — OXYCODONE HCL 5 MG PO TABS
15.0000 mg | ORAL_TABLET | Freq: Four times a day (QID) | ORAL | Status: DC | PRN
  Administered 2021-10-21 – 2021-10-29 (×7): 15 mg via ORAL
  Filled 2021-10-21 (×7): qty 3

## 2021-10-21 MED ORDER — ACETAMINOPHEN 325 MG PO TABS
650.0000 mg | ORAL_TABLET | Freq: Four times a day (QID) | ORAL | Status: DC | PRN
  Administered 2021-10-21: 650 mg via ORAL
  Filled 2021-10-21 (×2): qty 2

## 2021-10-21 MED ORDER — ENOXAPARIN SODIUM 40 MG/0.4ML IJ SOSY
40.0000 mg | PREFILLED_SYRINGE | Freq: Every day | INTRAMUSCULAR | Status: DC
Start: 2021-10-21 — End: 2021-10-29
  Administered 2021-10-21 – 2021-10-29 (×9): 40 mg via SUBCUTANEOUS
  Filled 2021-10-21 (×9): qty 0.4

## 2021-10-21 MED ORDER — ACETAMINOPHEN 650 MG RE SUPP: 650.0000 mg | Freq: Four times a day (QID) | RECTAL | Status: DC | PRN

## 2021-10-21 MED ORDER — LACTATED RINGERS IV SOLN
INTRAVENOUS | Status: DC
Start: 2021-10-21 — End: 2021-10-26
  Administered 2021-10-23: 900 mL via INTRAVENOUS

## 2021-10-21 NOTE — ED Notes (Signed)
MD Pickering notified of pt's increase from 99.0 to 100.0 oral, remains in a somnolence state, pt's can be heard making a humming noise, but will open eyes to name and touch, but cannot keep her eyes open to hold conversation, Pt will follow commands intermittently

## 2021-10-21 NOTE — ED Notes (Signed)
Pt still appears in a somnolence state, open eyes to name and touch, but cannot keep her eyes open, unable to stay awake to hold conversation. Pt will follow commands when awake, pt can sit up on her on. Dr. Rubin Payor notified and will order head CT

## 2021-10-21 NOTE — Hospital Course (Addendum)
71 yof with medical history significant for RA w/ chronic pain, GERD, and cyclical vomiting syndrome, who presented to the ED with 2 days of upper abdominal pain, nausea, and vomiting, symptoms described similar to her previous bouts of vomiting with cyclical vomiting syndrome followed by GI. Per report has not been taking her PPI regularly but was finding relief with meclizine, but ran out recently. In the ED-afebrile, CT was a limited study due to patient motion but negative for acute intracranial abnormality. LABS-potassium 3.4 and phosphorus 1.2.  Patient was treated with meclizine, Zofran, Protonix, Carafate, Ativan, and 2 doses of Haldol in the ED and was no longer vomiting but complains of ongoing nausea and does not want to try drinking anything yet so admitted for intractable nausea in the setting of cyclical vomiting syndrome.

## 2021-10-21 NOTE — ED Notes (Signed)
Pt return from CT scanner, NAD noted, resting on stretcher, call bell within reach, side rails up x2.No needs voiced from pt at this time

## 2021-10-21 NOTE — ED Provider Notes (Signed)
  Physical Exam  BP (!) 166/90 (BP Location: Right Arm)   Pulse 84   Temp 100 F (37.8 C) (Oral)   Resp 20   SpO2 100%   Physical Exam  Procedures  Procedures  ED Course / MDM   Clinical Course as of 10/21/21 0401  Fri Oct 20, 2021  1507 Patient reassessed. She states that the nausea and vomiting is better, vertigo is persistent [AN]    Clinical Course User Index [AN] Derwood Kaplan, MD   Medical Decision Making Amount and/or Complexity of Data Reviewed Labs: ordered. Radiology: ordered.  Risk Prescription drug management.   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  Had abdominal pain with history of cyclic vomiting.  Also had some dizziness.  History of vertigo.  Had had Zofran Antivert and had some improvement of the nausea.  However did have Haldol 2.5 mg twice followed by some Ativan.  Last dose of that was 13 hours ago now.  However still remains somewhat somnolent.  Will awake some.  However cannot tolerate orals.  Does have a rather severe hypophosphatemia at 1.2.  With persistent mental status change could be secondary to the Haldol.  Head CT was added and was reassuring.  However after now 18 hours of ER time I think she would benefit from mission to the hospital.       Benjiman Core, MD 10/21/21 0403

## 2021-10-21 NOTE — ED Notes (Signed)
Pt to CT scanner at this time 

## 2021-10-21 NOTE — H&P (Signed)
History and Physical    Taneha Valentini [id number] DOB: [birth year] DOA: 10/20/2021  PCP: Mindi Curling, PA-C   Patient coming from: Home   Chief Complaint: Upper abdominal pain, N/V   HPI: Kristen Ramos is a 61 y.o. female with medical history significant for rheumatoid arthritis, chronic pain, GERD, and cyclical vomiting syndrome, who presented to the emergency department with 2 days of upper abdominal pain, nausea, and vomiting.  Patient reports that symptoms are consistent with her many prior bouts of vomiting for which she is followed with GI and carries a diagnosis of cyclical vomiting syndrome.  She has not been taking her PPI regularly but was finding relief with meclizine, but ran out recently.  She denies fever, chills, chest pain, or focal numbness or weakness.  ED Course: Upon arrival to the ED, patient is found to be afebrile and saturating well on room air with normal heart rate and stable blood pressure.  EKG features sinus rhythm.  Head CT was a limited study due to patient motion but negative for acute intracranial abnormality.  Chemistry panel notable for potassium 3.4 and phosphorus 1.2.  Patient was treated with meclizine, Zofran, Protonix, Carafate, Ativan, and 2 doses of Haldol in the ED.  She is no longer vomiting but complains of ongoing nausea and does not want to try drinking anything yet.  Review of Systems:  All other systems reviewed and apart from HPI, are negative.  Past Medical History:  Diagnosis Date   Allergic rhinitis    Arthritis    Chronic back pain    GERD (gastroesophageal reflux disease)    RA (rheumatoid arthritis) ([place])     Past Surgical History:  Procedure Laterality Date   ABDOMINAL HYSTERECTOMY     BACK SURGERY     BIOPSY  04/19/2018   Procedure: BIOPSY;  Surgeon: Ronnette Juniper, MD;  Location: [facility];  Service: Gastroenterology;;   CESAREAN SECTION     ESOPHAGOGASTRODUODENOSCOPY (EGD) WITH PROPOFOL N/A 04/19/2018   Procedure:  ESOPHAGOGASTRODUODENOSCOPY (EGD) WITH PROPOFOL;  Surgeon: Ronnette Juniper, MD;  Location: [facility];  Service: Gastroenterology;  Laterality: N/A;    Social History:   reports that she has been smoking cigarettes. She has never used smokeless tobacco. She reports that she does not drink alcohol and does not use drugs.  Allergies  Allergen Reactions   Bee Venom Swelling    History reviewed. No pertinent family history.   Prior to Admission medications   Medication Sig Start Date End Date Taking? Authorizing Provider  meclizine (ANTIVERT) 25 MG tablet Take 1 tablet (25 mg total) by mouth 3 (three) times daily as needed for dizziness. 10/20/21  Yes Varney Biles, MD  amitriptyline (ELAVIL) 25 MG tablet Take 25 mg by mouth daily. 05/21/19   [provider]  cetirizine (ZYRTEC) 10 MG tablet Take 10 mg by mouth daily. 05/26/19   [provider]  famotidine (PEPCID) 40 MG tablet Take 40 mg by mouth 2 (two) times daily. 03/31/19   [provider]  folic acid (FOLVITE) 1 MG tablet Take 1 mg by mouth daily. 04/01/19   [provider]  HORIZANT 600 MG TBCR Take 1 tablet by mouth 2 (two) times daily. 06/01/19   [provider]  methotrexate (RHEUMATREX) 2.5 MG tablet Take 15 mg by mouth once a week. 03/31/19   [provider]  metoCLOPramide (REGLAN) 10 MG tablet Take 1 tablet (10 mg total) by mouth every 8 (eight) hours as needed for vomiting (nausea/headache). 07/23/21  Palumbo, April, MD  ondansetron (ZOFRAN) 4 MG tablet Take 1 tablet (4 mg total) by mouth every 6 (six) hours as needed for nausea or refractory nausea / vomiting. 03/26/21   Leroy Sea, MD  oxyCODONE (ROXICODONE) 15 MG immediate release tablet Take 15 mg by mouth 4 (four) times daily as needed for pain.  02/03/18   [provider]  pantoprazole (PROTONIX) 40 MG tablet Take 1 tablet (40 mg total) by mouth daily. 06/22/19   Sheikh, Kateri Mc Latif, DO  polyethylene glycol  (MIRALAX / GLYCOLAX) 17 g packet Take 17 g by mouth daily. Patient taking differently: Take 17 g by mouth daily as needed for mild constipation. 06/23/19   Marguerita Merles Latif, DO  scopolamine (TRANSDERM-SCOP) 1 MG/3DAYS Place 1 patch (1.5 mg total) onto the skin every 3 (three) days. Patient not taking: Reported on 03/25/2021 06/24/19   Marguerita Merles Latif, DO  tiZANidine (ZANAFLEX) 4 MG tablet Take 4 mg by mouth 2 (two) times daily as needed for muscle spasms. 09/29/18   [provider]    Physical Exam: Vitals:   10/20/21 2355 10/21/21 0040 10/21/21 0200 10/21/21 0356  BP: (!) 173/92 138/61 (!) 125/100 (!) 166/90  Pulse: 81 72 74 84  Resp: (!) 21 20 (!) 21 20  Temp:    100 F (37.8 C)  TempSrc:    Oral  SpO2: 100% 100% 100% 100%    Constitutional: NAD, calm  Eyes: PERTLA, lids and conjunctivae normal ENMT: Mucous membranes are moist. Posterior pharynx clear of any exudate or lesions.   Neck: supple, no masses  Respiratory: no wheezing, no crackles. No accessory muscle use.  Cardiovascular: S1 & S2 heard, regular rate and rhythm. No extremity edema.  Abdomen: No distension, no tenderness, soft. Bowel sounds active.  Musculoskeletal: no clubbing / cyanosis. No joint deformity upper and lower extremities.   Skin: no significant rashes, lesions, ulcers. Warm, dry, well-perfused. Neurologic: CN 2-12 grossly intact. Moving all extremities. Sleeping, wakes to voice. Oriented to person, place, and situation.  Psychiatric: Calm. Cooperative.    Labs and Imaging on Admission: I have personally reviewed following labs and imaging studies  CBC: Recent Labs  Lab 10/20/21 1128  WBC 5.2  NEUTROABS 3.6  HGB 14.5  HCT 42.1  MCV 90.9  PLT 338   Basic Metabolic Panel: Recent Labs  Lab 10/20/21 1128  NA 138  K 3.4*  CL 107  CO2 17*  GLUCOSE 136*  BUN <5*  CREATININE 0.82  CALCIUM 9.5  MG 1.9  PHOS 1.2*   GFR: CrCl cannot be calculated (Unknown ideal weight.). Liver  Function Tests: Recent Labs  Lab 10/20/21 1128  AST 22  ALT 14  ALKPHOS 86  BILITOT 0.7  PROT 8.2*  ALBUMIN 3.9   No results for input(s): "LIPASE", "AMYLASE" in the last 168 hours. No results for input(s): "AMMONIA" in the last 168 hours. Coagulation Profile: No results for input(s): "INR", "PROTIME" in the last 168 hours. Cardiac Enzymes: No results for input(s): "CKTOTAL", "CKMB", "CKMBINDEX", "TROPONINI" in the last 168 hours. BNP (last 3 results) No results for input(s): "PROBNP" in the last 8760 hours. HbA1C: No results for input(s): "HGBA1C" in the last 72 hours. CBG: No results for input(s): "GLUCAP" in the last 168 hours. Lipid Profile: No results for input(s): "CHOL", "HDL", "LDLCALC", "TRIG", "CHOLHDL", "LDLDIRECT" in the last 72 hours. Thyroid Function Tests: No results for input(s): "TSH", "T4TOTAL", "FREET4", "T3FREE", "THYROIDAB" in the last 72 hours. Anemia Panel: No results for  input(s): "VITAMINB12", "FOLATE", "FERRITIN", "TIBC", "IRON", "RETICCTPCT" in the last 72 hours. Urine analysis:    Component Value Date/Time   COLORURINE YELLOW 10/21/2021 0440   APPEARANCEUR CLEAR 10/21/2021 0440   LABSPEC 1.017 10/21/2021 0440   PHURINE 5.0 10/21/2021 0440   GLUCOSEU NEGATIVE 10/21/2021 0440   HGBUR MODERATE (A) 10/21/2021 0440   BILIRUBINUR NEGATIVE 10/21/2021 0440   KETONESUR 20 (A) 10/21/2021 0440   PROTEINUR 30 (A) 10/21/2021 0440   NITRITE NEGATIVE 10/21/2021 0440   LEUKOCYTESUR NEGATIVE 10/21/2021 0440   Sepsis Labs: @LABRCNTIP (procalcitonin:4,lacticidven:4) )No results found for this or any previous visit (from the past 240 hour(s)).   Radiological Exams on Admission: CT HEAD WO CONTRAST ( )  Result Date: 10/21/2021 CLINICAL DATA:  Altered mental status. EXAM: CT HEAD WITHOUT CONTRAST TECHNIQUE: Contiguous axial images were obtained from the base of the skull through the vertex without intravenous contrast. RADIATION DOSE REDUCTION: This exam was  performed according to the departmental dose-optimization program which includes automated exposure control, adjustment of the mA and/or kV according to patient size and/or use of iterative reconstruction technique. COMPARISON:  None Available. FINDINGS: Brain: No evidence of acute infarction, hemorrhage, hydrocephalus, extra-axial collection or mass lesion/mass effect. Vascular: No hyperdense vessel or unexpected calcification. Skull: Normal. Negative for fracture or focal lesion. Sinuses/Orbits: No acute finding. Other: It should be noted that the study is limited secondary to marked severity patient motion and subsequent artifact. IMPRESSION: 1. Limited study secondary to marked severity patient motion and subsequent artifact. 2. No acute intracranial abnormality. Electronically Signed   By: 12/21/2021 M.D.   On: 10/21/2021 03:10    EKG: Independently reviewed. Sinus rhythm.   Assessment/Plan   1. Intractable N/V; cyclical vomiting syndrome  - Pt with chronic N/V followed by GI presents with 2 days of increased symptoms and inability to tolerate any oral intake  - Her symptoms had improved with meclizine but she recently ran out; has not been taking her PPI or Elavil regularly   - She was treated aggressively in ED with Zofran, Protonix, meclizine, Ativan, and Haldol x2 but continues to complain of nausea and does not feel ready to try sips  - Abdominal exam benign, no acute findings on head CT  - Continue antiemetics, IVF, advance diet as tolerated    2. Chronic pain  - Prescription database reviewed  - Continue home medications    3. GERD  - Continue PPI    4. Hypokalemia; hypophosphatemia  - Replacing     DVT prophylaxis: Lovenox  Code Status: Full  Level of Care: Level of care: Telemetry Medical Family Communication: None present  Disposition Plan:  Patient is from: home  Anticipated d/c is to: Home  Anticipated d/c date is: 10/22/21  Patient currently: Pending tolerance  of adequate oral intake  Consults called: None  Admission status: Observation     10/24/21, MD [facility]  10/21/2021, 5:36 AM

## 2021-10-21 NOTE — ED Notes (Signed)
Ramesh MD made aware of pts COWS assessment score.

## 2021-10-21 NOTE — ED Notes (Signed)
Pt appears to be sleeping, even RR and unlabored, NAD noted, call bell in reach, side rails up x2 for safety, care on going, will continue to monitor. 

## 2021-10-21 NOTE — ED Notes (Signed)
Pt appears to be sleeping, even RR and unlabored, NAD noted, call bell in reach, side rails up x2 for safety, care on going, will continue to monitor. Pt still currently waiting for her head CT

## 2021-10-21 NOTE — ED Notes (Signed)
Pt was reposition in bed, sheets readjusted, pt placed in a clear, dry diaper for comfort. Warm blankets applied on pt

## 2021-10-21 NOTE — Progress Notes (Signed)
  Patient seen and examined personally, I reviewed the chart, history and physical and admission note, done by admitting physician this morning and agree with the same with following addendum.  Please refer to the morning admission note for more detailed plan of care.  Briefly,  45 yof with medical history significant for RA w/ chronic pain, GERD, and cyclical vomiting syndrome, who presented to the ED with 2 days of upper abdominal pain, nausea, and vomiting, symptoms described similar to her previous bouts of vomiting with cyclical vomiting syndrome followed by GI. Per report has not been taking her PPI regularly but was finding relief with meclizine, but ran out recently. In the ED-afebrile, CT was a limited study due to patient motion but negative for acute intracranial abnormality. LABS-potassium 3.4 and phosphorus 1.2.  Patient was treated with meclizine, Zofran, Protonix, Carafate, Ativan, and 2 doses of Haldol in the ED and was no longer vomiting but complains of ongoing nausea and does not want to try drinking anything yet so admitted for intractable nausea in the setting of cyclical vomiting syndrome.     Issues: Intractable nausea and vomiting Cyclical vomiting syndrome Dehydration with ketosis in the urine: Patient with dehydration ketosis in the setting of intractable nausea vomiting with chronic cyclical vomiting syndrome.  Continue symptomatic management with meclizine,ppi, ivf, diet trials w/ clears.  Chronic back pain: Resume oxy, cont her pain management.  Nursing concern is elevated because this morning but patient is alert awake vital stable no tachycardia no sweating GERD: cont ppi. Hypokalemia/hypophosphatemia:Repleted. Recheck.

## 2021-10-21 NOTE — Plan of Care (Signed)
  Problem: Pain Managment: Goal: General experience of comfort will improve Outcome: Progressing   Problem: Safety: Goal: Ability to remain free from injury will improve Outcome: Progressing   Problem: Nutrition: Goal: Adequate nutrition will be maintained Outcome: Not Progressing   

## 2021-10-22 ENCOUNTER — Observation Stay (HOSPITAL_COMMUNITY): Payer: Medicare (Managed Care)

## 2021-10-22 DIAGNOSIS — G8929 Other chronic pain: Secondary | ICD-10-CM | POA: Diagnosis present

## 2021-10-22 DIAGNOSIS — M069 Rheumatoid arthritis, unspecified: Secondary | ICD-10-CM | POA: Diagnosis present

## 2021-10-22 DIAGNOSIS — Z90710 Acquired absence of both cervix and uterus: Secondary | ICD-10-CM | POA: Diagnosis not present

## 2021-10-22 DIAGNOSIS — D84821 Immunodeficiency due to drugs: Secondary | ICD-10-CM | POA: Diagnosis present

## 2021-10-22 DIAGNOSIS — R9401 Abnormal electroencephalogram [EEG]: Secondary | ICD-10-CM | POA: Diagnosis present

## 2021-10-22 DIAGNOSIS — Z6831 Body mass index (BMI) 31.0-31.9, adult: Secondary | ICD-10-CM | POA: Diagnosis not present

## 2021-10-22 DIAGNOSIS — M48061 Spinal stenosis, lumbar region without neurogenic claudication: Secondary | ICD-10-CM | POA: Diagnosis present

## 2021-10-22 DIAGNOSIS — R112 Nausea with vomiting, unspecified: Secondary | ICD-10-CM | POA: Diagnosis not present

## 2021-10-22 DIAGNOSIS — M549 Dorsalgia, unspecified: Secondary | ICD-10-CM | POA: Diagnosis present

## 2021-10-22 DIAGNOSIS — K429 Umbilical hernia without obstruction or gangrene: Secondary | ICD-10-CM | POA: Diagnosis present

## 2021-10-22 DIAGNOSIS — E872 Acidosis, unspecified: Secondary | ICD-10-CM | POA: Diagnosis present

## 2021-10-22 DIAGNOSIS — J309 Allergic rhinitis, unspecified: Secondary | ICD-10-CM | POA: Diagnosis present

## 2021-10-22 DIAGNOSIS — E669 Obesity, unspecified: Secondary | ICD-10-CM | POA: Diagnosis present

## 2021-10-22 DIAGNOSIS — Z79899 Other long term (current) drug therapy: Secondary | ICD-10-CM | POA: Diagnosis not present

## 2021-10-22 DIAGNOSIS — G9340 Encephalopathy, unspecified: Secondary | ICD-10-CM | POA: Diagnosis present

## 2021-10-22 DIAGNOSIS — K219 Gastro-esophageal reflux disease without esophagitis: Secondary | ICD-10-CM | POA: Diagnosis present

## 2021-10-22 DIAGNOSIS — E860 Dehydration: Secondary | ICD-10-CM | POA: Diagnosis present

## 2021-10-22 DIAGNOSIS — Z7960 Long term (current) use of unspecified immunomodulators and immunosuppressants: Secondary | ICD-10-CM | POA: Diagnosis not present

## 2021-10-22 DIAGNOSIS — Z91030 Bee allergy status: Secondary | ICD-10-CM | POA: Diagnosis not present

## 2021-10-22 DIAGNOSIS — R1115 Cyclical vomiting syndrome unrelated to migraine: Secondary | ICD-10-CM | POA: Diagnosis present

## 2021-10-22 DIAGNOSIS — F17210 Nicotine dependence, cigarettes, uncomplicated: Secondary | ICD-10-CM | POA: Diagnosis present

## 2021-10-22 DIAGNOSIS — E876 Hypokalemia: Secondary | ICD-10-CM | POA: Diagnosis not present

## 2021-10-22 DIAGNOSIS — R42 Dizziness and giddiness: Secondary | ICD-10-CM | POA: Diagnosis present

## 2021-10-22 DIAGNOSIS — R569 Unspecified convulsions: Secondary | ICD-10-CM | POA: Diagnosis present

## 2021-10-22 LAB — BASIC METABOLIC PANEL
Anion gap: 11 (ref 5–15)
BUN: 6 mg/dL (ref 6–20)
CO2: 20 mmol/L — ABNORMAL LOW (ref 22–32)
Calcium: 9 mg/dL (ref 8.9–10.3)
Chloride: 105 mmol/L (ref 98–111)
Creatinine, Ser: 0.7 mg/dL (ref 0.44–1.00)
GFR, Estimated: >60 mL/min (ref >60)
Glucose, Bld: 115 mg/dL — ABNORMAL HIGH (ref 70–99)
Potassium: 3.6 mmol/L (ref 3.5–5.1)
Sodium: 136 mmol/L (ref 135–145)

## 2021-10-22 LAB — CBC
HCT: 38 % (ref 36.0–46.0)
Hemoglobin: 13.5 g/dL (ref 12.0–15.0)
MCH: 32 pg (ref 26.0–34.0)
MCHC: 35.5 g/dL (ref 30.0–36.0)
MCV: 90 fL (ref 80.0–100.0)
Platelets: 297 10*3/uL (ref 150–400)
RBC: 4.22 MIL/uL (ref 3.87–5.11)
RDW: 13.9 % (ref 11.5–15.5)
WBC: 8.2 10*3/uL (ref 4.0–10.5)
nRBC: 0 % (ref 0.0–0.2)

## 2021-10-22 LAB — PHOSPHORUS: Phosphorus: 2.9 mg/dL (ref 2.5–4.6)

## 2021-10-22 LAB — MAGNESIUM: Magnesium: 1.8 mg/dL (ref 1.7–2.4)

## 2021-10-22 MED ORDER — METOCLOPRAMIDE HCL 5 MG/ML IJ SOLN
10.0000 mg | Freq: Three times a day (TID) | INTRAMUSCULAR | Status: DC
Start: 2021-10-22 — End: 2021-10-23
  Administered 2021-10-22 – 2021-10-23 (×4): 10 mg via INTRAVENOUS
  Filled 2021-10-22 (×4): qty 2

## 2021-10-22 MED ORDER — MORPHINE SULFATE (PF) 2 MG/ML IV SOLN: 2.0000 mg | INTRAVENOUS | Status: DC | PRN

## 2021-10-22 NOTE — Progress Notes (Signed)
2 episodes of n/v noted between 7p-7a. Both noted to be clear. Unable to advance patient diet

## 2021-10-22 NOTE — Progress Notes (Signed)
PROGRESS NOTE Kristen Ramos  VOZ:[id number] DOB: [birth year] DOA: 10/20/2021 PCP: Jordan Hawks, PA-C   Brief Narrative/Hospital Course: 27 yof with medical history significant for RA w/ chronic pain, GERD, and cyclical vomiting syndrome, who presented to the ED with 2 days of upper abdominal pain, nausea, and vomiting, symptoms described similar to her previous bouts of vomiting with cyclical vomiting syndrome followed by GI. Per report has not been taking her PPI regularly but was finding relief with meclizine, but ran out recently. In the ED-afebrile, CT was a limited study due to patient motion but negative for acute intracranial abnormality. LABS-potassium 3.4 and phosphorus 1.2.  Patient was treated with meclizine, Zofran, Protonix, Carafate, Ativan, and 2 doses of Haldol in the ED and was no longer vomiting but complains of ongoing nausea and does not want to try drinking anything yet so admitted for intractable nausea in the setting of cyclical vomiting syndrome.      Subjective: Seen and examined. Overnight patient had 2 episodes of clear  vomiting and unable to tolerate/advance diet Complains of generalized abdominal discomfort No chest pain fever chills  Assessment and Plan: Principal Problem:   Intractable nausea and vomiting Active Problems:   Chronic back pain   GERD (gastroesophageal reflux disease)   Intractable nausea and vomiting Cyclical vomiting syndrome Dehydration with ketosis in the urine Metabolic acidosis: Patient with dehydration/ketosis in the setting of intractable nausea vomiting with chronic cyclical vomiting syndrome.  Unable to advance/tolerate diet we will continue with current IV Protonix, IV fluids, antiemetics pain management-flat abdominal x-ray unremarkable, added IV Reglan-if persistent symptoms will consider GI eval vs further imaging. Currently afebrile no leukocytosis LFTs lipase are normal.  Chronic back pain: Continue her home oxy/pain  management. prn iv morphine for severe pain.  GERD: cont ppi. Hypokalemia/hypophosphatemia: Resolved   DVT prophylaxis: enoxaparin (LOVENOX) injection 40 mg Start: 10/21/21 1000 Code Status:   Code Status: Full Code Family Communication: plan of care discussed with patient at bedside. Patient status is: Admitted as observation but remains hospitalized unable to tolerate diet, remains on IV fluids for the hydration.  Level of care: Telemetry Medical  Dispo: The patient is from: home            Anticipated disposition: home in 1-2 days once tolerating diet  Mobility Assessment (last 72 hours)     Mobility Assessment     Row Name 10/21/21 2000 10/21/21 1052         Does patient have an order for bedrest or is patient medically unstable No - Continue assessment Yes- Bedfast (Level 1) - Complete      What is the highest level of mobility based on the progressive mobility assessment? Level 5 (Walks with assist in room/hall) - Balance while stepping forward/back and can walk in room with assist - Complete --                Objective: Vitals last 24 hrs: Vitals:   10/21/21 1948 10/22/21 0020 10/22/21 0223 10/22/21 0526  BP: (!) 154/75 (!) 176/102 (!) 160/79 (!) 154/74  Pulse: 65 90 62 78  Resp: 18 17 17 16   Temp: 99.6 F (37.6 C) 98.6 F (37 C) (!) 97.5 F (36.4 C) 98 F (36.7 C)  TempSrc: Oral Oral Oral Oral  SpO2: 100% 100% 100% 100%   Weight change:   Physical Examination: General exam: alert awake,older than stated age, weak appearing. HEENT:Oral mucosa moist, Ear/Nose WNL grossly, dentition normal. Respiratory system: bilaterally diminished BS, no  use of accessory muscle Cardiovascular system: S1 & S2 +, No JVD. Gastrointestinal system: Abdomen soft, mild tenderness on the mid epigastrium,ND, BS+ Nervous System:Alert, awake, moving extremities and grossly nonfocal Extremities: LE edema neg ,distal peripheral pulses palpable.  Skin: No rashes,no icterus. MSK:  Normal muscle bulk,tone, power  Medications reviewed:  Scheduled Meds:  enoxaparin (LOVENOX) injection  40 mg Subcutaneous Daily   meclizine  12.5 mg Oral TID   pantoprazole (PROTONIX) IV  40 mg Intravenous Q24H   sodium chloride flush  3 mL Intravenous Q12H   Continuous Infusions:  lactated ringers 100 mL/hr at 10/22/21 0647   promethazine (PHENERGAN) injection (IM or IVPB)        Diet Order             Diet clear liquid Room service appropriate? Yes; Fluid consistency: Thin  Diet effective now                            Intake/Output Summary (Last 24 hours) at 10/22/2021 0741 Last data filed at 10/22/2021 0600 Gross per 24 hour  Intake 634.97 ml  Output --  Net 634.97 ml   Net IO Since Admission: 634.97 mL [10/22/21 0741]  Wt Readings from Last 3 Encounters:  07/23/21 89.4 kg  03/26/21 89.4 kg  11/16/20 96.2 kg     Unresulted Labs (From admission, onward)     Start     Ordered   10/28/21 0500  Creatinine, serum  (enoxaparin (LOVENOX)    CrCl >/= 30 ml/min)  Weekly,   R     Comments: while on enoxaparin therapy    10/21/21 0536          Data Reviewed: I have personally reviewed following labs and imaging studies CBC: Recent Labs  Lab 10/20/21 1128 10/22/21 0406  WBC 5.2 8.2  NEUTROABS 3.6  --   HGB 14.5 13.5  HCT 42.1 38.0  MCV 90.9 90.0  PLT 338 297   Basic Metabolic Panel: Recent Labs  Lab 10/20/21 1128 10/21/21 0729 10/21/21 0737 10/22/21 0051  NA 138 134*  --  136  K 3.4* 4.0  --  3.6  CL 107 103  --  105  CO2 17* 19*  --  20*  GLUCOSE 136* 122*  --  115*  BUN <5* 7  --  6  CREATININE 0.82 0.65  --  0.70  CALCIUM 9.5 9.3  --  9.0  MG 1.9  --   --  1.8  PHOS 1.2*  --  3.5 2.9   GFR: CrCl cannot be calculated (Unknown ideal weight.). Liver Function Tests: Recent Labs  Lab 10/20/21 1128  AST 22  ALT 14  ALKPHOS 86  BILITOT 0.7  PROT 8.2*  ALBUMIN 3.9   No results for input(s): "LIPASE", "AMYLASE" in the last 168  hours. No results for input(s): "AMMONIA" in the last 168 hours. Coagulation Profile: No results for input(s): "INR", "PROTIME" in the last 168 hours. BNP (last 3 results) No results for input(s): "PROBNP" in the last 8760 hours. HbA1C: No results for input(s): "HGBA1C" in the last 72 hours. CBG: No results for input(s): "GLUCAP" in the last 168 hours. Lipid Profile: No results for input(s): "CHOL", "HDL", "LDLCALC", "TRIG", "CHOLHDL", "LDLDIRECT" in the last 72 hours. Thyroid Function Tests: No results for input(s): "TSH", "T4TOTAL", "FREET4", "T3FREE", "THYROIDAB" in the last 72 hours. Sepsis Labs: No results for input(s): "PROCALCITON", "LATICACIDVEN" in the last 168 hours.  No results found for this or any previous visit (from the past 240 hour(s)).  Antimicrobials: Anti-infectives (From admission, onward)    None      Culture/Microbiology    Component Value Date/Time   SDES BLOOD LEFT HAND 06/19/2019 1342   SPECREQUEST  06/19/2019 1342    BOTTLES DRAWN AEROBIC ONLY Blood Culture adequate volume Performed at [facility], [address], [place], [place] [address]    CULT NO GROWTH 5 DAYS 06/19/2019 1342   REPTSTATUS 06/24/2019 FINAL 06/19/2019 1342   Radiology Studies: CT HEAD WO CONTRAST ( )  Result Date: 10/21/2021 CLINICAL DATA:  Altered mental status. EXAM: CT HEAD WITHOUT CONTRAST TECHNIQUE: Contiguous axial images were obtained from the base of the skull through the vertex without intravenous contrast. RADIATION DOSE REDUCTION: This exam was performed according to the departmental dose-optimization program which includes automated exposure control, adjustment of the mA and/or kV according to patient size and/or use of iterative reconstruction technique. COMPARISON:  None Available. FINDINGS: Brain: No evidence of acute infarction, hemorrhage, hydrocephalus, extra-axial collection or mass lesion/mass effect. Vascular: No hyperdense vessel or  unexpected calcification. Skull: Normal. Negative for fracture or focal lesion. Sinuses/Orbits: No acute finding. Other: It should be noted that the study is limited secondary to marked severity patient motion and subsequent artifact. IMPRESSION: 1. Limited study secondary to marked severity patient motion and subsequent artifact. 2. No acute intracranial abnormality. Electronically Signed   By: Aram Candela M.D.   On: 10/21/2021 03:10     LOS: 0 days   Lanae Boast, MD [facility]  10/22/2021, 7:41 AM

## 2021-10-23 ENCOUNTER — Inpatient Hospital Stay (HOSPITAL_COMMUNITY): Payer: Medicare (Managed Care)

## 2021-10-23 DIAGNOSIS — R112 Nausea with vomiting, unspecified: Secondary | ICD-10-CM | POA: Diagnosis not present

## 2021-10-23 LAB — BASIC METABOLIC PANEL
Anion gap: 13 (ref 5–15)
BUN: 9 mg/dL (ref 6–20)
CO2: 21 mmol/L — ABNORMAL LOW (ref 22–32)
Calcium: 8.9 mg/dL (ref 8.9–10.3)
Chloride: 101 mmol/L (ref 98–111)
Creatinine, Ser: 0.67 mg/dL (ref 0.44–1.00)
GFR, Estimated: >60 mL/min (ref >60)
Glucose, Bld: 97 mg/dL (ref 70–99)
Potassium: 3.2 mmol/L — ABNORMAL LOW (ref 3.5–5.1)
Sodium: 135 mmol/L (ref 135–145)

## 2021-10-23 LAB — GLUCOSE, CAPILLARY: Glucose-Capillary: 101 mg/dL — ABNORMAL HIGH (ref 70–99)

## 2021-10-23 MED ORDER — IOHEXOL 300 MG/ML  SOLN
100.0000 mL | Freq: Once | INTRAMUSCULAR | Status: AC | PRN
  Administered 2021-10-23: 100 mL via INTRAVENOUS

## 2021-10-23 MED ORDER — POTASSIUM CHLORIDE CRYS ER 20 MEQ PO TBCR
40.0000 meq | EXTENDED_RELEASE_TABLET | Freq: Once | ORAL | Status: AC
  Administered 2021-10-23: 40 meq via ORAL
  Filled 2021-10-23: qty 2

## 2021-10-23 MED ORDER — IOHEXOL 9 MG/ML PO SOLN
ORAL | Status: AC
  Administered 2021-10-23: 1000 mL
  Filled 2021-10-23: qty 1000

## 2021-10-23 NOTE — Progress Notes (Signed)
PROGRESS NOTE Kristen Ramos  EXH:[id number] DOB: [birth year] DOA: 10/20/2021 PCP: Jordan Hawks, PA-C   Brief Narrative/Hospital Course: 35 yof with medical history significant for RA w/ chronic pain, GERD, and cyclical vomiting syndrome, who presented to the ED with 2 days of upper abdominal pain, nausea, and vomiting, symptoms described similar to her previous bouts of vomiting with cyclical vomiting syndrome followed by GI. Per report has not been taking her PPI regularly but was finding relief with meclizine, but ran out recently. In the ED-afebrile, CT was a limited study due to patient motion but negative for acute intracranial abnormality. LABS-potassium 3.4 and phosphorus 1.2.  Patient was treated with meclizine, Zofran, Protonix, Carafate, Ativan, and 2 doses of Haldol in the ED and was no longer vomiting but complains of ongoing nausea and does not want to try drinking anything yet so admitted for intractable nausea in the setting of cyclical vomiting syndrome.      Subjective: Seen and examined Much more alert awake Spoke with patient's daughter over the phone see usually likes to sleep and lay on while she is going through these episodes, she gets these episodes once or twice a year Patient is not motivated to eat any food or get up.  Assessment and Plan: Principal Problem:   Intractable nausea and vomiting Active Problems:   Chronic back pain   GERD (gastroesophageal reflux disease)   Intractable nausea and vomiting Cyclical vomiting syndrome Dehydration with ketosis in the urine Metabolic acidosis: Patient with dehydration/ketosis in the setting of intractable nausea vomiting with chronic cyclical vomiting syndrome.  Advance to full liquid diet, encourage oral hydration oral intake.  We will obtain CT abdomen pelvis given persistent symptoms. Cont current IV Protonix, IV fluids, antiemetics -minimize sedatives narcotics as she has been lethargic.  Currently afebrile no  leukocytosis LFTs lipase are normal.  Chronic back pain: Continue her home oxy/pain management. prn iv morphine for severe pain-avoid for sedation.  GERD: cont ppi. Hypokalemia/hypophosphatemia: Replaced potassium  DVT prophylaxis: enoxaparin (LOVENOX) injection 40 mg Start: 10/21/21 1000 Code Status:   Code Status: Full Code Family Communication: plan of care discussed with patient at bedside. Patient status is: Inpatient for ongoing inability to tolerate diet and ongoing need for IV fluids  hydration.  I updated daughter over the phone  Level of care: Telemetry Medical  Dispo: The patient is from: home            Anticipated disposition: home in 1-2 days once tolerating diet  Mobility Assessment (last 72 hours)     Mobility Assessment     Row Name 10/22/21 2300 10/22/21 0930 10/21/21 2000 10/21/21 1052     Does patient have an order for bedrest or is patient medically unstable No - Continue assessment No - Continue assessment No - Continue assessment Yes- Bedfast (Level 1) - Complete    What is the highest level of mobility based on the progressive mobility assessment? Level 2 (Chairfast) - Balance while sitting on edge of bed and cannot stand Level 6 (Walks independently in room and hall) - Balance while walking in room without assist - Complete Level 5 (Walks with assist in room/hall) - Balance while stepping forward/back and can walk in room with assist - Complete --    Is the above level different from baseline mobility prior to current illness? Yes - Recommend PT order -- -- --              Objective: Vitals last 24 hrs: Vitals:  10/22/21 1714 10/22/21 1944 10/23/21 0447 10/23/21 0856  BP: (!) 151/95 (!) 164/89 (!) 165/85 137/76  Pulse: 71 70 84 82  Resp:  16 16 17   Temp:  98.8 F (37.1 C) 99 F (37.2 C) 99.6 F (37.6 C)  TempSrc:  Oral Oral Oral  SpO2:  100% 100% 100%   Weight change:   Physical Examination: General exam: AAox3, able to speak up and  interact older than stated age, weak appearing. HEENT:Oral mucosa moist, Ear/Nose WNL grossly, dentition normal. Respiratory system: bilaterally diminished, no use of accessory muscle Cardiovascular system: S1 & S2 +, No JVD,. Gastrointestinal system: Abdomen soft,NT,ND,BS+ Nervous System:Alert, awake, moving extremities and grossly nonfocal Extremities: LE ankle edema neg, distal peripheral pulses palpable.  Skin: No rashes,no icterus. MSK: Normal muscle bulk,tone, power   Medications reviewed:  Scheduled Meds:  enoxaparin (LOVENOX) injection  40 mg Subcutaneous Daily   meclizine  12.5 mg Oral TID   metoCLOPramide (REGLAN) injection  10 mg Intravenous Q8H   pantoprazole (PROTONIX) IV  40 mg Intravenous Q24H   sodium chloride flush  3 mL Intravenous Q12H   Continuous Infusions:  lactated ringers 100 mL/hr at 10/23/21 10/25/21      Diet Order             Diet full liquid Room service appropriate? Yes; Fluid consistency: Thin  Diet effective now                  Intake/Output Summary (Last 24 hours) at 10/23/2021 1048 Last data filed at 10/23/2021 0630 Gross per 24 hour  Intake 2198.49 ml  Output 600 ml  Net 1598.49 ml   Net IO Since Admission: 2,233.46 mL [10/23/21 1048]  Wt Readings from Last 3 Encounters:  07/23/21 89.4 kg  03/26/21 89.4 kg  11/16/20 96.2 kg     Unresulted Labs (From admission, onward)     Start     Ordered   10/28/21 0500  Creatinine, serum  (enoxaparin (LOVENOX)    CrCl >/= 30 ml/min)  Weekly,   R     Comments: while on enoxaparin therapy    10/21/21 0536   10/24/21 0500  Basic metabolic panel  Tomorrow morning,   R        10/23/21 0942   10/24/21 0500  CBC  Tomorrow morning,   R        10/23/21 0942          Data Reviewed: I have personally reviewed following labs and imaging studies CBC: Recent Labs  Lab 10/20/21 1128 10/22/21 0406  WBC 5.2 8.2  NEUTROABS 3.6  --   HGB 14.5 13.5  HCT 42.1 38.0  MCV 90.9 90.0  PLT 338 297    Basic Metabolic Panel: Recent Labs  Lab 10/20/21 1128 10/21/21 0729 10/21/21 0737 10/22/21 0051 10/23/21 0752  NA 138 134*  --  136 135  K 3.4* 4.0  --  3.6 3.2*  CL 107 103  --  105 101  CO2 17* 19*  --  20* 21*  GLUCOSE 136* 122*  --  115* 97  BUN <5* 7  --  6 9  CREATININE 0.82 0.65  --  0.70 0.67  CALCIUM 9.5 9.3  --  9.0 8.9  MG 1.9  --   --  1.8  --   PHOS 1.2*  --  3.5 2.9  --    GFR: CrCl cannot be calculated (Unknown ideal weight.). Liver Function Tests: Recent Labs  Lab 10/20/21 1128  AST 22  ALT 14  ALKPHOS 86  BILITOT 0.7  PROT 8.2*  ALBUMIN 3.9   No results for input(s): "LIPASE", "AMYLASE" in the last 168 hours. No results for input(s): "AMMONIA" in the last 168 hours. Coagulation Profile: No results for input(s): "INR", "PROTIME" in the last 168 hours. BNP (last 3 results) No results for input(s): "PROBNP" in the last 8760 hours. HbA1C: No results for input(s): "HGBA1C" in the last 72 hours. CBG: No results for input(s): "GLUCAP" in the last 168 hours. Lipid Profile: No results for input(s): "CHOL", "HDL", "LDLCALC", "TRIG", "CHOLHDL", "LDLDIRECT" in the last 72 hours. Thyroid Function Tests: No results for input(s): "TSH", "T4TOTAL", "FREET4", "T3FREE", "THYROIDAB" in the last 72 hours. Sepsis Labs: No results for input(s): "PROCALCITON", "LATICACIDVEN" in the last 168 hours.  No results found for this or any previous visit (from the past 240 hour(s)).  Antimicrobials: Anti-infectives (From admission, onward)    None      Culture/Microbiology    Component Value Date/Time   SDES BLOOD LEFT HAND 06/19/2019 1342   SPECREQUEST  06/19/2019 1342    BOTTLES DRAWN AEROBIC ONLY Blood Culture adequate volume Performed at [facility], [address] [place], [place] [address]    CULT NO GROWTH 5 DAYS 06/19/2019 1342   REPTSTATUS 06/24/2019 FINAL 06/19/2019 1342   Radiology Studies: DG Abd 1 View  Result Date:  10/22/2021 CLINICAL DATA:  Nausea and vomiting. EXAM: ABDOMEN - 1 VIEW COMPARISON:  None Available. FINDINGS: No free air, portal venous gas, or pneumatosis. The bowel gas pattern is unremarkable no evidence of obstruction. IMPRESSION: No cause for symptoms identified.  No evidence of obstruction. Electronically Signed   By: Gerome Sam III M.D.   On: 10/22/2021 08:30     LOS: 1 day   Lanae Boast, MD [facility]  10/23/2021, 10:48 AM

## 2021-10-24 ENCOUNTER — Inpatient Hospital Stay (HOSPITAL_COMMUNITY): Payer: Medicare (Managed Care)

## 2021-10-24 DIAGNOSIS — R569 Unspecified convulsions: Secondary | ICD-10-CM

## 2021-10-24 DIAGNOSIS — R112 Nausea with vomiting, unspecified: Secondary | ICD-10-CM | POA: Diagnosis not present

## 2021-10-24 LAB — URINALYSIS, ROUTINE W REFLEX MICROSCOPIC
Bilirubin Urine: NEGATIVE
Glucose, UA: NEGATIVE mg/dL
Hgb urine dipstick: NEGATIVE
Ketones, ur: 20 mg/dL — AB
Leukocytes,Ua: NEGATIVE
Nitrite: NEGATIVE
Protein, ur: NEGATIVE mg/dL
Specific Gravity, Urine: 1.016 (ref 1.005–1.030)
pH: 7 (ref 5.0–8.0)

## 2021-10-24 LAB — CBC
HCT: 41.4 % (ref 36.0–46.0)
Hemoglobin: 14.5 g/dL (ref 12.0–15.0)
MCH: 31.7 pg (ref 26.0–34.0)
MCHC: 35 g/dL (ref 30.0–36.0)
MCV: 90.6 fL (ref 80.0–100.0)
Platelets: 297 10*3/uL (ref 150–400)
RBC: 4.57 MIL/uL (ref 3.87–5.11)
RDW: 13.5 % (ref 11.5–15.5)
WBC: 8.8 10*3/uL (ref 4.0–10.5)
nRBC: 0 % (ref 0.0–0.2)

## 2021-10-24 LAB — BASIC METABOLIC PANEL
Anion gap: 9 (ref 5–15)
BUN: 9 mg/dL (ref 6–20)
CO2: 21 mmol/L — ABNORMAL LOW (ref 22–32)
Calcium: 8.7 mg/dL — ABNORMAL LOW (ref 8.9–10.3)
Chloride: 105 mmol/L (ref 98–111)
Creatinine, Ser: 0.74 mg/dL (ref 0.44–1.00)
GFR, Estimated: >60 mL/min (ref >60)
Glucose, Bld: 93 mg/dL (ref 70–99)
Potassium: 3.1 mmol/L — ABNORMAL LOW (ref 3.5–5.1)
Sodium: 135 mmol/L (ref 135–145)

## 2021-10-24 LAB — MAGNESIUM: Magnesium: 1.9 mg/dL (ref 1.7–2.4)

## 2021-10-24 MED ORDER — LEVETIRACETAM IN NACL 1500 MG/100ML IV SOLN
1500.0000 mg | Freq: Once | INTRAVENOUS | Status: AC
  Administered 2021-10-24: 1500 mg via INTRAVENOUS
  Filled 2021-10-24: qty 100

## 2021-10-24 MED ORDER — POTASSIUM CHLORIDE 10 MEQ/100ML IV SOLN
10.0000 meq | INTRAVENOUS | Status: AC
  Administered 2021-10-24 (×3): 10 meq via INTRAVENOUS
  Filled 2021-10-24 (×3): qty 100

## 2021-10-24 NOTE — Progress Notes (Signed)
At shift change when nurse went to visit the pt, the pt was noticed having a seizure, seizure last 2 minutes and 50 seconds.Suction was set up in the room and on call provider notified, rapid response nurse also came to lay eyes on the pt. Pt daughter was in the room and all questions answered. Since then no signs of any more seizures on this shift, will continue to monitor.

## 2021-10-24 NOTE — Progress Notes (Signed)
EEG has been read as abnormal.  Have sent message to neurology for consult as she will need long term eeg based on note from reading neurologist. Kristen Ramos

## 2021-10-24 NOTE — Progress Notes (Signed)
EEG complete - results pending 

## 2021-10-24 NOTE — Progress Notes (Signed)
vLTM started  all impedances below 10kohms.  Patient event button tested.  Family in room aware of how to press when needs.  [facility] monitoring

## 2021-10-24 NOTE — Procedures (Addendum)
Patient Name: Kristen Ramos  MRN: [id number]  Epilepsy Attending: Charlsie Quest  Referring Physician/Provider: Joseph Art, DO  Date: 10/24/2021 Duration: 24.38 mins  Patient history: 61yo M with seizure like activity. EEG to evaluate for seizure  Level of alertness: lethargic  AEDs during EEG study: None  Technical aspects: This EEG study was done with scalp electrodes positioned according to the 10-20 International system of electrode placement. Electrical activity was reviewed with band pass filter of 1-70Hz , sensitivity of 7 uV/mm, display speed of 36mm/sec with a 60Hz  notched filter applied as appropriate. EEG data were recorded continuously and digitally stored.  Video monitoring was available and reviewed as appropriate.  Description: No clear posterior dominant rhythm was seen. EEG showed continuous generalized 5-7hz  theta slowing admixed with intermittent generalized 2-3Hz  delta slowing. Generalized periodic discharges with triphasic morphology at 2.5-3 Hz were also noted intermittently. Hyperventilation and photic stimulation were not performed.      ABNORMALITY - Periodic discharges with triphasic morphology, generalized ( GPDs) - Continuous slow, generalized   IMPRESSION: This study generalized periodic discharges with triphasic morphology which can be on the ictal-interictal continuum. Recommend long term eeg monitoring for further evaluation.  Additionally there is evidence of moderate diffuse encephalopathy.  No seizures were seen throughout the recording.  Misk Galentine 

## 2021-10-24 NOTE — Progress Notes (Signed)
PROGRESS NOTE Kristen Ramos  QIO:[id number] DOB: 1960/08/14 DOA: 10/20/2021 PCP: Jordan Hawks, PA-C   Brief Narrative/Hospital Course: 10 yof with medical history significant for RA w/ chronic pain, GERD, and cyclical vomiting syndrome, who presented to the ED with 2 days of upper abdominal pain, nausea, and vomiting, symptoms described similar to her previous bouts of vomiting with cyclical vomiting syndrome followed by GI. Per report has not been taking her PPI regularly but was finding relief with meclizine, but ran out recently. Patient was treated with meclizine, Zofran, Protonix, Carafate, Ativan, and 2 doses of Haldol in the ED and was no longer vomiting but complains of ongoing nausea and does not want to try drinking anything yet so admitted for intractable nausea in the setting of cyclical vomiting syndrome.  Nursing note indicate patient had seizure like activity on PM of 8/14-- does not appear anyone was notified.     Subjective: C/o abdominal pain -does not remember "seizure episode" yesterday  Assessment and Plan: Principal Problem:   Intractable nausea and vomiting Active Problems:   Chronic back pain   GERD (gastroesophageal reflux disease)   Intractable nausea and vomiting Cyclical vomiting syndrome Dehydration with ketosis in the urine Metabolic acidosis: Patient with dehydration/ketosis in the setting of intractable nausea vomiting with chronic cyclical vomiting syndrome.   -Advance to full liquid diet, encourage oral hydration oral intake.   - CT abdomen pelvis done: No acute intra-abdominal or intrapelvic abnormalities. Tiny umbilical hernia containing fat.  Slightly prominent CBD 11 mm diameter, unchanged, correlate with LFTs. Cont current IV Protonix, -Currently afebrile no leukocytosis LFTs lipase are normal.  Seizure like activity -PM of 8/14 -no known history -note from RN not clear who was notified/what was done -EEG ordered -will hold on neurology  consult/MRI -has had head CT done already in hospitalization  Obesity Estimated body mass index is 31.81 kg/m as calculated from the following:   Height as of 07/23/21:  (1.676 m).   Weight as of 07/23/21: 89.4 kg.   Chronic back pain: Continue her home oxy/pain management. prn iv morphine for severe pain  GERD: cont ppi.  Hypokalemia/hypophosphatemia: Replaced potassium  RA w/ finger deformity- methotrexate on hold  DVT prophylaxis: enoxaparin (LOVENOX) injection 40 mg Start: 10/21/21 1000 Code Status:   Code Status: Full Code Family Communication: plan of care discussed with patient at bedside. Patient status is: Inpatient for ongoing inability to tolerate diet and ongoing need for IV fluids  hydration.    Level of care: Telemetry Medical  Dispo: The patient is from: home              Objective: Vitals last 24 hrs: Vitals:   10/23/21 1942 10/23/21 2010 10/24/21 0528 10/24/21 0937  BP: (!) 185/82 (!) 146/95 (!) 167/86 104/89  Pulse: (!) 105 (!) 108 71 81  Resp: Temp: 99.8 F (37.7 C)  98.5 F (36.9 C) 98.1 F (36.7 C)  TempSrc: Axillary  Oral Oral  SpO2: 100%  99% 98%   Weight change:   Physical Examination:  General: Appearance:    Obese female in no acute distress     Lungs:     respirations unlabored  Heart:    Normal heart rate.  MS:   All extremities are intact.   Neurologic:   Awake, alert     Medications reviewed:  Scheduled Meds:  enoxaparin (LOVENOX) injection  40 mg Subcutaneous Daily   pantoprazole (PROTONIX) IV  40 mg Intravenous Q24H  sodium chloride flush  3 mL Intravenous Q12H   Continuous Infusions:  lactated ringers 100 mL/hr at 10/24/21 0857   potassium chloride 10 mEq (10/24/21 1100)      Diet Order             Diet full liquid Room service appropriate? Yes; Fluid consistency: Thin  Diet effective now                  Intake/Output Summary (Last 24 hours) at 10/24/2021 1132 Last data filed at 10/24/2021  1020 Gross per 24 hour  Intake 0 ml  Output 700 ml  Net -700 ml   Net IO Since Admission: 1,593.46 mL [10/24/21 1132]  Wt Readings from Last 3 Encounters:  07/23/21 89.4 kg  03/26/21 89.4 kg  11/16/20 96.2 kg     Unresulted Labs (From admission, onward)     Start     Ordered   10/28/21 0500  Creatinine, serum  (enoxaparin (LOVENOX)    CrCl >/= 30 ml/min)  Weekly,   R     Comments: while on enoxaparin therapy    10/21/21 0536   10/24/21 0931  Urinalysis, Routine w reflex microscopic  Once,   R        10/24/21 0930   10/24/21 0928  Magnesium  Add-on,   AD       Question:  Specimen collection method  Answer:  Lab=Lab collect   10/24/21 6270          Data Reviewed: I have personally reviewed following labs and imaging studies CBC: Recent Labs  Lab 10/20/21 1128 10/22/21 0406 10/24/21 0208  WBC 5.2 8.2 8.8  NEUTROABS 3.6  --   --   HGB 14.5 13.5 14.5  HCT 42.1 38.0 41.4  MCV 90.9 90.0 90.6  PLT 338 297 297   Basic Metabolic Panel: Recent Labs  Lab 10/20/21 1128 10/21/21 0729 10/21/21 0737 10/22/21 0051 10/23/21 0752 10/24/21 0208  NA 138 134*  --  136 135 135  K 3.4* 4.0  --  3.6 3.2* 3.1*  CL 107 103  --  105 101 105  CO2 17* 19*  --  20* 21* 21*  GLUCOSE 136* 122*  --  115* 97 93  BUN <5* 7  --  6 9 9   CREATININE 0.82 0.65  --  0.70 0.67 0.74  CALCIUM 9.5 9.3  --  9.0 8.9 8.7*  MG 1.9  --   --  1.8  --   --   PHOS 1.2*  --  3.5 2.9  --   --    GFR: CrCl cannot be calculated (Unknown ideal weight.). Liver Function Tests: Recent Labs  Lab 10/20/21 1128  AST 22  ALT 14  ALKPHOS 86  BILITOT 0.7  PROT 8.2*  ALBUMIN 3.9   No results for input(s): "LIPASE", "AMYLASE" in the last 168 hours. No results for input(s): "AMMONIA" in the last 168 hours. Coagulation Profile: No results for input(s): "INR", "PROTIME" in the last 168 hours. BNP (last 3 results) No results for input(s): "PROBNP" in the last 8760 hours. HbA1C: No results for input(s):  "HGBA1C" in the last 72 hours. CBG: Recent Labs  Lab 10/23/21 2140  GLUCAP 101*   Lipid Profile: No results for input(s): "CHOL", "HDL", "LDLCALC", "TRIG", "CHOLHDL", "LDLDIRECT" in the last 72 hours. Thyroid Function Tests: No results for input(s): "TSH", "T4TOTAL", "FREET4", "T3FREE", "THYROIDAB" in the last 72 hours. Sepsis Labs: No results for input(s): "PROCALCITON", "LATICACIDVEN" in the last  168 hours.  No results found for this or any previous visit (from the past 240 hour(s)).  Antimicrobials: Anti-infectives (From admission, onward)    None      Culture/Microbiology    Component Value Date/Time   SDES BLOOD LEFT HAND 06/19/2019 1342   SPECREQUEST  06/19/2019 1342    BOTTLES DRAWN AEROBIC ONLY Blood Culture adequate volume Performed at [facility], [address], [place], [place] [address]    CULT NO GROWTH 5 DAYS 06/19/2019 1342   REPTSTATUS 06/24/2019 FINAL 06/19/2019 1342   Radiology Studies: CT ABDOMEN PELVIS W CONTRAST  Result Date: 10/23/2021 CLINICAL DATA:  Intractable nausea and vomiting with chronic cyclical vomiting syndrome, dehydration, ketosis EXAM: CT ABDOMEN AND PELVIS WITH CONTRAST TECHNIQUE: Multidetector CT imaging of the abdomen and pelvis was performed using the standard protocol following bolus administration of intravenous contrast. RADIATION DOSE REDUCTION: This exam was performed according to the departmental dose-optimization program which includes automated exposure control, adjustment of the mA and/or kV according to patient size and/or use of iterative reconstruction technique. CONTRAST:  OMNIPAQUE IOHEXOL 300 MG/ML SOLN IV. No oral contrast. COMPARISON:  06/19/2019 FINDINGS: Lower chest: Lung bases clear Hepatobiliary: Gallbladder and liver normal appearance. Slightly prominent CBD 11 mm diameter, unchanged. Pancreas: Normal appearance Spleen: Normal appearance Adrenals/Urinary Tract: LEFT adrenal thickening without  discrete mass. Unremarkable RIGHT adrenal gland and kidneys. No renal mass, hydronephrosis, ureteral dilatation, or urinary tract calcification. Bladder unremarkable. Stomach/Bowel: Normal appendix. Stomach and bowel loops normal appearance Vascular/Lymphatic: Atherosclerotic calcifications aorta and iliac arteries without aneurysm. No adenopathy. Reproductive: Uterus surgically absent. Small amount of fluid in vagina. Ovaries unremarkable. Other: No free air or free fluid. Tiny umbilical hernia containing fat. Foci of subcutaneous gas anterior abdominal wall question medication injection site Musculoskeletal: Prior lumbar fusion.  No acute osseous findings. IMPRESSION: No acute intra-abdominal or intrapelvic abnormalities. Tiny umbilical hernia containing fat. Slightly prominent CBD 11 mm diameter, unchanged, correlate with LFTs. Aortic Atherosclerosis (ICD10-I70.0). Electronically Signed   By: Ulyses Southward M.D.   On: 10/23/2021 15:13     LOS: 2 days   Joseph Art, DO [facility]  10/24/2021, 11:32 AM

## 2021-10-24 NOTE — Progress Notes (Signed)
OT Cancellation Note  Patient Details Name: Kristen Ramos MRN: [id number] DOB: 03-Nov-1960   Cancelled Treatment:    Reason Eval/Treat Not Completed: Medical issues which prohibited therapy;Fatigue/lethargy limiting ability to participate Pt with documented seizure this AM and per staff, extremely lethargic. Will hold OT eval attempts at this time and follow up as schedule permits, likely tomorrow 8/16.  Lorre Munroe 10/24/2021, 10:57 AM

## 2021-10-24 NOTE — Progress Notes (Signed)
PT Cancellation Note  Patient Details Name: Kristen Ramos MRN: [id number] DOB: 1960/03/23   Cancelled Treatment:    Reason Eval/Treat Not Completed: Pain limiting ability to participate;Fatigue/lethargy limiting ability to participate;Other (comment) (MD reporting need to further assess seizure activity) Attempted x 2 to see pt for PT evaluation. On first attempt, pt asleep and RN reporting lethargy. On second attempt, pt awake with NT in room but reporting 7/10 pain in stomach and fatigue and declined participation at this time. Spoke with MD who reported trying to gather more information on pt's seizure activity from last night/early morning and requested pt be put on therapy schedule for tomorrow - notified OT.   Marlana Salvage Imagene Gurney PT, DPT  10/24/2021, 10:57 AM

## 2021-10-25 DIAGNOSIS — R569 Unspecified convulsions: Secondary | ICD-10-CM | POA: Diagnosis not present

## 2021-10-25 DIAGNOSIS — R112 Nausea with vomiting, unspecified: Secondary | ICD-10-CM | POA: Diagnosis not present

## 2021-10-25 LAB — BASIC METABOLIC PANEL
Anion gap: 10 (ref 5–15)
BUN: 7 mg/dL (ref 6–20)
CO2: 20 mmol/L — ABNORMAL LOW (ref 22–32)
Calcium: 8.4 mg/dL — ABNORMAL LOW (ref 8.9–10.3)
Chloride: 106 mmol/L (ref 98–111)
Creatinine, Ser: 0.69 mg/dL (ref 0.44–1.00)
GFR, Estimated: >60 mL/min (ref >60)
Glucose, Bld: 80 mg/dL (ref 70–99)
Potassium: 3.6 mmol/L (ref 3.5–5.1)
Sodium: 136 mmol/L (ref 135–145)

## 2021-10-25 MED ORDER — LEVETIRACETAM IN NACL 500 MG/100ML IV SOLN
500.0000 mg | Freq: Two times a day (BID) | INTRAVENOUS | Status: DC
Start: 2021-10-25 — End: 2021-10-29
  Administered 2021-10-25 – 2021-10-28 (×8): 500 mg via INTRAVENOUS
  Filled 2021-10-25 (×9): qty 100

## 2021-10-25 NOTE — Consult Note (Signed)
Neurology Consultation Reason for Consult: Abnormal EEG Requesting Physician: Mahala Menghini  CC: Cyclic vomiting  History is obtained from:Patient, Chart review  HPI: Kristen Ramos is a 61 y.o. female with a past medical history of arthritis, chronic pain, GERD, and cyclical vomiting syndrome, who presented to the emergency department with 2 days of upper abdominal pain, nausea, and vomiting. Patient reports that her symptoms are consistent with her previous episodes of cyclical vomiting syndrome. She does follow with GI.   She does not use a PPI regularly, but she does use meclizine.  Seizure like activity reports on 8/14 per notes. She was subsequently connected to EEG and it was noted that she had GTC and Triphasic waves. 1500mg  Keppra load with 500mg  BID.    She states that she does not have a history of seizures, head trauma, falls, or headaches and she denies alcohol or drug use. She is still reporting nausea, but denies vomiting overnight.   ROS: All other review of systems was negative except as noted in the HPI.  Unable to obtain due to altered mental status.   Past Medical History:  Diagnosis Date   Allergic rhinitis    Arthritis    Chronic back pain    GERD (gastroesophageal reflux disease)    RA (rheumatoid arthritis) (HCC)    History reviewed. No pertinent family history.   Social History:  reports that she has been smoking cigarettes. She has never used smokeless tobacco. She reports that she does not drink alcohol and does not use drugs.   Exam: Current vital signs: BP (!) 160/83 (BP Location: Right Arm)   Pulse 72   Temp 97.8 F (36.6 C) (Oral)   Resp 17   SpO2 100%  Vital signs in last 24 hours: Temp:  [97.8 F (36.6 C)-99.4 F (37.4 C)] 97.8 F (36.6 C) (08/16 0420) Pulse Rate:  [72-88] 72 (08/16 0420) Resp:  [17-18] 17 (08/16 0420) BP: (104-160)/(77-89) 160/83 (08/16 0420) SpO2:  [97 %-100 %] 100 % (08/16 0420)   Physical Exam  Constitutional: Appears  well-developed and well-nourished.  Psych: Affect appropriate to situation,  Eyes: No scleral injection HENT: No oropharyngeal obstruction.  MSK: no joint deformities.  Cardiovascular: Normal rate and regular rhythm.  Perfusing extremities well Respiratory: Effort normal, non-labored breathing GI: Soft.  No distension. There is no tenderness.  Skin: Warm dry and intact visible skin  Neuro: Mental Status: Patient is awake, alert, oriented to person, place, month, year, and situation. Patient is able to give a clear and coherent history. No signs of aphasia or neglect Cranial Nerves: II: Visual Fields are full. Pupils are equal, round, and reactive to light.   III,IV, VI: EOMI without ptosis or diploplia.  V: Facial sensation is symmetric to temperature VII: Facial movement is symmetric.  VIII: hearing is intact to voice X: Uvula elevates symmetrically XI: Shoulder shrug is symmetric. XII: tongue is midline without atrophy or fasciculations.  Motor: Tone is normal. Bulk is normal. Poor effort noted during exam. Strength is 4/5 in all extremities.  Sensory: Sensation is symmetric to light touch and temperature in the arms and legs. Deep Tendon Reflexes: 2+ and symmetric in the brachioradialis and patellae.  Plantars: Toes are downgoing bilaterally.  Cerebellar: FNF and HKS are intact bilaterally Gait:  Deferred in acute setting   I have reviewed labs in epic and the results pertinent to this consultation are:  I have reviewed the images obtained:   Impression:   Recommendations: - Continue Keppra 500mg  BID -  Continue LTM for 24 hours minimum - MRI Brain w wo contrast ordered    NEUROHOSPITALIST ADDENDUM Performed a face to face diagnostic evaluation.   I have reviewed the contents of history and physical exam as documented by PA/ARNP/Resident and agree with above documentation.  I have discussed and formulated the above plan as documented. Edits to the note have  been made as needed.  Impression/Key exam findings/Plan: first time seizure in hospital with GPD/triphasic waves on EEG and thus on LTM now. Plan is to do LTM for 24 hours and if no further seizures, will get MRI Brain with and without contrast.  No seizure risk factors that she endorses. Denies any prior hx of seizures.  Given her seizure and EEG findings, will do Keppra 500mg  BID and can consider repeat EEG outpatient after a few months and consider tapering off.  We will follow up on MRI Brain.  , MD [facility] Erick Blinks   If 7pm to 7am, please call on call as listed on AMION.

## 2021-10-25 NOTE — Care Management Important Message (Signed)
Important Message  Patient Details  Name: Kristen Ramos MRN: [id number] Date of Birth: 07/30/1960   Medicare Important Message Given:  Yes     Sherilyn Banker 10/25/2021, 2:08 PM

## 2021-10-25 NOTE — Evaluation (Signed)
Occupational Therapy Evaluation Patient Details Name: Kristen Ramos MRN: [id number] DOB: 07/26/1960 Today's Date: 10/25/2021   History of Present Illness Pt is a 61 y/o female who presented to ED with upper abdominal pain, nausea and vomiting suspicious for cyclical vomiting syndrome. During admission, pt experienced seizure-like activity with abnormal EEG and further work up pending. PMH: chronic pain, GERD   Clinical Impression   PTA, pt lives with family and reports typically Independent in all ADLs, IADLs and mobility without AD. No family present to confirm PLOF at this time. Pt presents now with significant motor planning impairments, flat affect and slow response time hindering completion of all daily tasks. Pt requires Total A to roll in bed with max cues. Unable to progress EOB as pt noted with bowel incontinence and requiring Total A for clean up bed level. Question if current presentation mirroring postictal symptoms and if pt will show improvements in next therapy sessions. Rec SNF rehab based on current presentation though will follow acutely and update DC recs as appropriate.       Recommendations for follow up therapy are one component of a multi-disciplinary discharge planning process, led by the attending physician.  Recommendations may be updated based on patient status, additional functional criteria and insurance authorization.   Follow Up Recommendations  Skilled nursing-short term rehab (<3 hours/day)    Assistance Recommended at Discharge Frequent or constant Supervision/Assistance  Patient can return home with the following A lot of help with walking and/or transfers;A lot of help with bathing/dressing/bathroom    Functional Status Assessment  Patient has had a recent decline in their functional status and demonstrates the ability to make significant improvements in function in a reasonable and predictable amount of time.  Equipment Recommendations  Other (comment)  (TBD pending progress)    Recommendations for Other Services       Precautions / Restrictions Precautions Precautions: Fall Restrictions Weight Bearing Restrictions: No      Mobility Bed Mobility Overal bed mobility: Needs Assistance Bed Mobility: Rolling Rolling: Total assist         General bed mobility comments: Max multimodal cues and assist to initiate tasks, able to hold to bedrail when cued but unable to sustain task. Attempted EOB but noted with bowel incontinence once rolled    Transfers                          Balance                                           ADL either performed or assessed with clinical judgement   ADL Overall ADL's : Needs assistance/impaired Eating/Feeding: Moderate assistance;Bed level   Grooming: Moderate assistance;Bed level   Upper Body Bathing: Maximal assistance;Bed level   Lower Body Bathing: Total assistance;Bed level   Upper Body Dressing : Bed level;Maximal assistance Upper Body Dressing Details (indicate cue type and reason): able to lift UE when cued for donning clean gown Lower Body Dressing: Total assistance;Bed level Lower Body Dressing Details (indicate cue type and reason): donning socks     Toileting- Clothing Manipulation and Hygiene: Total assistance;Bed level Toileting - Clothing Manipulation Details (indicate cue type and reason): NT also present d/t pt difficulty maintaining side lying (with poor command following). Total A for clean up x 2 after bowel incontinence bed level. Pt reports able to  feel bathroom urge typically though no indication of that during session.       General ADL Comments: Poor motor planning, inconsistent/delayed command following and unable to fully progress EOB/OOB today     Vision Ability to See in Adequate Light: 0 Adequate Patient Visual Report: Other (comment) (unable to fully assess) Vision Assessment?: No apparent visual deficits     Perception      Praxis      Pertinent Vitals/Pain Pain Assessment Pain Assessment: Faces Faces Pain Scale: Hurts little more Pain Location: peri region with peri care Pain Descriptors / Indicators: Grimacing, Guarding Pain Intervention(s): Monitored during session, Limited activity within patient's tolerance     Hand Dominance Right   Extremity/Trunk Assessment Upper Extremity Assessment Upper Extremity Assessment: RUE deficits/detail;LUE deficits/detail RUE Deficits / Details: 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, pt reports this is arthritic deformities RUE Coordination: decreased fine motor LUE Deficits / Details: 1 PIP contracture LUE Coordination: decreased fine motor   Lower Extremity Assessment Lower Extremity Assessment: Defer to PT evaluation       Communication Communication Communication: Expressive difficulties;Other (comment) (inconsistent responses)   Cognition Arousal/Alertness: Awake/alert Behavior During Therapy: Flat affect Overall Cognitive Status: No family/caregiver present to determine baseline cognitive functioning Area of Impairment: Orientation, Attention, Following commands, Safety/judgement, Awareness, Problem solving                 Orientation Level: Disoriented to, Situation Current Attention Level: Sustained   Following Commands: Follows one step commands inconsistently, Follows one step commands with increased time Safety/Judgement: Decreased awareness of safety, Decreased awareness of deficits Awareness: Intellectual Problem Solving: Slow processing, Decreased initiation, Difficulty sequencing, Requires verbal cues, Requires tactile cues General Comments: Able to accurately report hospital, August but decreased awareness of situation. Flat affect and poor motor planning with inconsistent follow through of basic instructions.     General Comments  Communicated with MD prior to session, cleared for therapy attempts with EEG    Exercises      Shoulder Instructions      Home Living Family/patient expects to be discharged to:: Private residence Living Arrangements: Other relatives (son in Sports coach, daughter, granddaughter?) Available Help at Discharge: Friend(s) Type of Home: Apartment Home Access: Level entry     Home Layout: Two level Alternate Level Stairs-Number of Steps: 14   Bathroom Shower/Tub: Teacher, early years/pre: Standard     Home Equipment: Conservation officer, nature (2 wheels)   Additional Comments: home setup from previous stay + patient able to confirm some of home setup      Prior Functioning/Environment Prior Level of Function : Independent/Modified Independent;Patient poor historian/Family not available             Mobility Comments: reports independence in mobility ADLs Comments: reports independent with ADLs, IADLs and driving        OT Problem List: Decreased strength;Impaired balance (sitting and/or standing);Decreased activity tolerance;Decreased cognition;Decreased safety awareness;Decreased coordination      OT Treatment/Interventions: Self-care/ADL training;Therapeutic exercise;Energy conservation;DME and/or AE instruction;Therapeutic activities;Patient/family education;Balance training    OT Goals(Current goals can be found in the care plan section) Acute Rehab OT Goals Patient Stated Goal: unable to state today OT Goal Formulation: Patient unable to participate in goal setting Time For Goal Achievement: 11/08/21 Potential to Achieve Goals: Good ADL Goals Pt Will Perform Grooming: with set-up;sitting Pt Will Perform Lower Body Dressing: with min assist;sitting/lateral leans;sit to/from stand Pt Will Transfer to Toilet: with min assist;stand pivot transfer;bedside commode Additional ADL Goal #1:  Pt to complete bed mobility with no more than Min A in prep for ADLs  OT Frequency: Min 2X/week    Co-evaluation              AM-PAC OT "6 Clicks" Daily Activity     Outcome  Measure Help from another person eating meals?: A Lot Help from another person taking care of personal grooming?: A Lot Help from another person toileting, which includes using toliet, bedpan, or urinal?: Total Help from another person bathing (including washing, rinsing, drying)?: A Lot Help from another person to put on and taking off regular upper body clothing?: A Lot Help from another person to put on and taking off regular lower body clothing?: Total 6 Click Score: 10   End of Session    Activity Tolerance: Patient tolerated treatment well Patient left: in bed;with call bell/phone within reach  OT Visit Diagnosis: Other abnormalities of gait and mobility (R26.89);Muscle weakness (generalized) (M62.81);Other symptoms and signs involving cognitive function                Time: 0630-1601 OT Time Calculation (min): 22 min Charges:  OT General Charges $OT Visit: 1 Visit OT Evaluation $OT Eval Moderate Complexity: 1 Mod  Bradd Canary, OTR/L Acute Rehab Services Office: [phone]   Lorre Munroe 10/25/2021, 11:08 AM

## 2021-10-25 NOTE — Progress Notes (Signed)
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 Chronic tobacco Allergic rhinitis  Admit 8/12 upper abdominal pain nausea vomiting-admits to running out of meclizine recently and not taking PPI Elavil regularly  Found to have potassium 3.4 phosphorus 1.2-was treated with meclizine Zofran Protonix Carafate Ativan Haldol and found to have starvation ketosis consistent with her chronic cyclical vomiting 8/14 p.m.-found to have seizure lasting 2 minutes 50 seconds-EEG ordered--neurology consulted   Hospital-Problem based course  Clinical vomiting syndrome Starvation ketosis Seems to be improved but having some diarrhea--no vomiting therefore will advance diet Probably got worse because patient ran out of medication on admission Advance from full liquid to soft diet CT abdomen pelvis previously showed no new findings but tiny umbilical hernia Cut back fluids to 50 cc/H Will discontinue morphine in a.m. continue oxycodone as Oxy IR 15 every 6 as needed severe pain Seizure-like activity on 8/14 EEG long-term is pending--preliminary shows mild diffuse encephalopathy with no seizures or epileptiform discharges and EEG.  Improved Neurology recommending Keppra 500 twice daily and outpatient EEG MRI is ordered and pending Driving recommendations as per neurology Hypokalemia on admission Given runs of potassium 8/15 now corrected Periodic labs Rheumatoid arthritis with finger deformities Methotrexate on hold resume as an outpatient  DVT prophylaxis: Lovenox Code Status: Full Family Communication: None present Disposition:  Status is: Inpatient Remains inpatient appropriate because: Requires further  work-up including MRI brain  Will need skilled nursing facility placement on discharge likely in 24 to 48 hours   Consultants:  Neurology  Procedures: No  Antimicrobials: No   Subjective: Slightly sleepy on long-term EEG still in no distress  Objective: Vitals:   10/24/21 1530 10/24/21 2113 10/25/21 0420 10/25/21 0500  BP: 127/77  (!) 160/83   Pulse: 77 88 72   Resp: 18 18 17    Temp: 99.4 F (37.4 C)  97.8 F (36.6 C)   TempSrc: Axillary  Oral   SpO2: 97% 100% 100%   Weight:    76.2 kg    Intake/Output Summary (Last 24 hours) at 10/25/2021 0730 Last data filed at 10/25/2021 0316 Gross per 24 hour  Intake 4433.06 ml  Output 300 ml  Net 4133.06 ml   Filed Weights   10/25/21 0500  Weight: 76.2 kg    Examination:  EOMI NCAT no focal deficit Chest clear S1-S2 no murmur Moving 4 limbs equally without distress Abdomen soft no rebound no guarding ROM intact  Data Reviewed: personally reviewed   CBC    Component Value Date/Time   WBC 8.8 10/24/2021 0208   RBC 4.57 10/24/2021 0208   HGB 14.5 10/24/2021 0208   HCT 41.4 10/24/2021 0208   PLT 297 10/24/2021 0208   MCV 90.6 10/24/2021 0208   MCH 31.7 10/24/2021 0208   MCHC 35.0 10/24/2021 0208   RDW 13.5 10/24/2021 0208   LYMPHSABS 1.1 10/20/2021 1128   MONOABS 0.3 10/20/2021 1128   EOSABS 0.0 10/20/2021 1128   BASOSABS 0.0 10/20/2021 1128      Latest Ref Rng & Units 10/25/2021    2:08 AM 10/24/2021    2:08 AM 10/23/2021    7:52 AM  CMP  Glucose 70 - 99 mg/dL 80  93  97   BUN 6 - 20 mg/dL 7  9  9   Creatinine 0.44 - 1.00 mg/dL 5.99  3.57  0.17   Sodium 135 - 145 mmol/L 136  135  135   Potassium 3.5 - 5.1 mmol/L 3.6  3.1  3.2   Chloride 98 - 111 mmol/L 106  105  101   CO2 22 - 32 mmol/L 20  21  21    Calcium 8.9 - 10.3 mg/dL 8.4  8.7  8.9      Radiology Studies: EEG adult  Result Date: November 22, 2021 10/26/2021, MD     22-Nov-2021  3:08 PM Patient Name: Kristen Ramos MRN: Silvana Newness Epilepsy  Attending: [id number] Referring Physician/Provider: Charlsie Quest, DO Date: 11/22/21 Duration: 24.38 mins Patient history: 61yo M with seizure like activity. EEG to evaluate for seizure Level of alertness: lethargic AEDs during EEG study: None Technical aspects: This EEG study was done with scalp electrodes positioned according to the 10-20 International system of electrode placement. Electrical activity was reviewed with band pass filter of 1-70Hz , sensitivity of 7 uV/mm, display speed of 76mm/sec with a 60Hz  notched filter applied as appropriate. EEG data were recorded continuously and digitally stored.  Video monitoring was available and reviewed as appropriate. Description: No clear posterior dominant rhythm was seen. EEG showed continuous generalized 5-7hz  theta slowing admixed with intermittent generalized 2-3Hz  delta slowing. Generalized periodic discharges with triphasic morphology at 2.5-3 Hz were also noted intermittently. Hyperventilation and photic stimulation were not performed.    ABNORMALITY - Periodic discharges with triphasic morphology, generalized ( GPDs) - Continuous slow, generalized  IMPRESSION: This study generalized periodic discharges with triphasic morphology which can be on the ictal-interictal continuum. Recommend long term eeg monitoring for further evaluation.  Additionally there is evidence of moderate diffuse encephalopathy.  No seizures were seen throughout the recording. 31m   CT ABDOMEN PELVIS W CONTRAST  Result Date: 10/23/2021 CLINICAL DATA:  Intractable nausea and vomiting with chronic cyclical vomiting syndrome, dehydration, ketosis EXAM: CT ABDOMEN AND PELVIS WITH CONTRAST TECHNIQUE: Multidetector CT imaging of the abdomen and pelvis was performed using the standard protocol following bolus administration of intravenous contrast. RADIATION DOSE REDUCTION: This exam was performed according to the departmental dose-optimization program which includes  automated exposure control, adjustment of the mA and/or kV according to patient size and/or use of iterative reconstruction technique. CONTRAST:  Charlsie Quest OMNIPAQUE IOHEXOL 300 MG/ML SOLN IV. No oral contrast. COMPARISON:  06/19/2019 FINDINGS: Lower chest: Lung bases clear Hepatobiliary: Gallbladder and liver normal appearance. Slightly prominent CBD 11 mm diameter, unchanged. Pancreas: Normal appearance Spleen: Normal appearance Adrenals/Urinary Tract: LEFT adrenal thickening without discrete mass. Unremarkable RIGHT adrenal gland and kidneys. No renal mass, hydronephrosis, ureteral dilatation, or urinary tract calcification. Bladder unremarkable. Stomach/Bowel: Normal appendix. Stomach and bowel loops normal appearance Vascular/Lymphatic: Atherosclerotic calcifications aorta and iliac arteries without aneurysm. No adenopathy. Reproductive: Uterus surgically absent. Small amount of fluid in vagina. Ovaries unremarkable. Other: No free air or free fluid. Tiny umbilical hernia containing fat. Foci of subcutaneous gas anterior abdominal wall question medication injection site Musculoskeletal: Prior lumbar fusion.  No acute osseous findings. IMPRESSION: No acute intra-abdominal or intrapelvic abnormalities. Tiny umbilical hernia containing fat. Slightly prominent CBD 11 mm diameter, unchanged, correlate with LFTs. Aortic Atherosclerosis (ICD10-I70.0). Electronically Signed   By: M.D.   On: 10/23/2021 15:13     Scheduled Meds:  enoxaparin (LOVENOX) injection  40 mg Subcutaneous Daily   pantoprazole (PROTONIX) IV  40 mg Intravenous Q24H   sodium chloride flush  3 mL  Intravenous Q12H   Continuous Infusions:  lactated ringers 100 mL/hr at 10/25/21 0315   levETIRAcetam 500 mg (10/25/21 1014)     LOS: 3 days   Time spent: 55  Rhetta Mura, MD [facility] To contact the attending provider between 7A-7P or the covering provider during after hours 7P-7A, please log into the web site  [email or web address] and access using universal [place] password for that web site. If you do not have the password, please call the hospital operator.  10/25/2021, 7:30 AM

## 2021-10-25 NOTE — Progress Notes (Signed)
LTM maint complete - no skin breakdown under: Fp1 F3  Service P3 Fp1 , [facility] monitored, Event button test confirmed by [facility].

## 2021-10-25 NOTE — Procedures (Signed)
Patient Name: Kristen Ramos  MRN: [id number]  Epilepsy Attending: Charlsie Quest  Referring Physician/Provider: Charlsie Quest, MD Duration: 10/24/2021 10/25/2021 1045   Patient history: 61yo M with seizure like activity. EEG to evaluate for seizure   Level of alertness: awake, asleep   AEDs during EEG study: None   Technical aspects: This EEG study was done with scalp electrodes positioned according to the 10-20 International system of electrode placement. Electrical activity was reviewed with band pass filter of 1-70Hz , sensitivity of 7 uV/mm, display speed of 42mm/sec with a 60Hz  notched filter applied as appropriate. EEG data were recorded continuously and digitally stored.  Video monitoring was available and reviewed as appropriate.   Description:  The posterior dominant rhythm consists of 8 Hz activity of moderate voltage (25-35 uV) seen predominantly in posterior head regions, symmetric and reactive to eye opening and eye closing. Sleep was characterized by vertex waves, sleep spindles (12 to 14 Hz), maximal frontocentral region. EEG showed intermittent generalized rhythmic sharply contoured 2 to 3 Hz delta slowing.  Hyperventilation and photic stimulation were not performed.      ABNORMALITY -Intermittent rhythmic delta slow, generalized   IMPRESSION: This study is suggestive of mild diffuse encephalopathy.  No seizures or epileptiform discharges were seen throughout the recording.  EEG appears to be improved compared to previous day.   Kristen Ramos 

## 2021-10-25 NOTE — Evaluation (Addendum)
Physical Therapy Evaluation Patient Details Name: Kristen Ramos MRN: [id number] DOB: 1960/12/25 Today's Date: 10/25/2021  History of Present Illness  Pt is a 61 y/o female who presented to ED with upper abdominal pain, nausea and vomiting suspicious for cyclical vomiting syndrome. During admission, pt experienced seizure-like activity with abnormal EEG and further work up pending including MRI of brain. PMH: chronic back pain, GERD, RA.  Clinical Impression  Pt agreeable to physical therapy evaluation/treatment session. Pt with confusion and therapist unable to confirm PLOF at this time (some inconsistencies present compared to OT eval). Pt requiring total assistance for bed mobility including rolling R <> L and supine <> sit. Pt with poor ability to initiate movements due to motor planning deficits. Pt currently presents with functional limitations secondary to impairments listed in PT problem list. Pt to benefit from skilled, acute care physical therapy interventions to maximize her strength, independence level, and quality of life. Will continue to assess for discharge recommendations.        Recommendations for follow up therapy are one component of a multi-disciplinary discharge planning process, led by the attending physician.  Recommendations may be updated based on patient status, additional functional criteria and insurance authorization.  Follow Up Recommendations Skilled nursing-short term rehab (<3 hours/day) Can patient physically be transported by private vehicle: No    Assistance Recommended at Discharge Frequent or constant Supervision/Assistance  Patient can return home with the following  A lot of help with walking and/or transfers;A lot of help with bathing/dressing/bathroom;Direct supervision/assist for medications management;Direct supervision/assist for financial management;Help with stairs or ramp for entrance;Assistance with feeding    Equipment Recommendations Other  (comment) (TBD)  Recommendations for Other Services       Functional Status Assessment Patient has had a recent decline in their functional status and/or demonstrates limited ability to make significant improvements in function in a reasonable and predictable amount of time     Precautions / Restrictions Precautions Precautions: Fall Precaution Comments: bowel incontinence; seizure Restrictions Weight Bearing Restrictions: No      Mobility  Bed Mobility Overal bed mobility: Needs Assistance Bed Mobility: Rolling, Supine to Sit, Sit to Supine Rolling: +2 for physical assistance, Total assist   Supine to sit: Total assist, +2 for physical assistance Sit to supine: Total assist, +2 for physical assistance   General bed mobility comments: Pt required max cues to initiate movements but still with limited ability to do so. Pt required therapist bring her hand to bed rail when rolling. Pt performed rolling R <> L for pericare and to have linens changed prior to supine <> sit transfer. Pt able to maintain SL holding rail with standby A once in position. Pt rolled to R side during supine to sit due to inability to move B LE's towards EOB while in supine including resisting therapist assist. Pt able to hold herself up at EOB with R UE use and standby A. Deferred further mobility due to current level of assistance required for basic bed mobility tasks.    Transfers                   General transfer comment: deferred    Ambulation/Gait                  Stairs            Wheelchair Mobility    Modified Rankin (Stroke Patients Only)       Balance Overall balance assessment: Needs assistance Sitting-balance support: Single extremity  supported Sitting balance-Leahy Scale: Fair                                       Pertinent Vitals/Pain Pain Assessment Pain Assessment: No/denies pain (Pt denies headache, N/T, vision changes, dizziness. However,  does report R shoulder pain when performing supine to sit (DNQ).)    Home Living Family/patient expects to be discharged to:: Private residence Living Arrangements: Other relatives (son in law and granddaughter) Available Help at Discharge: Family Type of Home: Apartment Home Access: Level entry     Alternate Level Stairs-Number of Steps: 14 with right and left railings w/n reach Home Layout: Two level Home Equipment: None Additional Comments: Pt limited historian.    Prior Function Prior Level of Function : Independent/Modified Independent;Driving (denies falls)             Mobility Comments: reports independence in mobility; did not use AD ADLs Comments: reports independent with ADLs, IADLs, and driving including for grocery shopping and errands     Hand Dominance   Dominant Hand: Right    Extremity/Trunk Assessment   Upper Extremity Assessment Upper Extremity Assessment: Defer to OT evaluation    Lower Extremity Assessment Lower Extremity Assessment: Difficult to assess due to impaired cognition (Grossly 2-/5 B LE's; unable to perform SLR)       Communication   Communication: Expressive difficulties;Other (comment) (decreased responses)  Cognition Arousal/Alertness: Awake/alert Behavior During Therapy: Flat affect Overall Cognitive Status: No family/caregiver present to determine baseline cognitive functioning Area of Impairment: Orientation, Attention, Following commands, Safety/judgement, Awareness, Problem solving                 Orientation Level: Disoriented to, Time, Situation (DOB) Current Attention Level: Sustained   Following Commands: Follows one step commands inconsistently, Follows one step commands with increased time Safety/Judgement: Decreased awareness of safety, Decreased awareness of deficits Awareness: Intellectual, Emergent, Anticipatory Problem Solving: Slow processing, Decreased initiation, Difficulty sequencing, Requires verbal  cues, Requires tactile cues General Comments: Pt unable to identify her DOB correctly (able to when asked later on in session). Pt unable to identify current year. Pt with slow processing. Also unable to orient to reason she is in hospital. Able to identify time correctly on analog clock. Unable to count forwards by 3's with instructions.        General Comments General comments (skin integrity, edema, etc.): Cleared by MD and nurse for participation in therapy. 97% SpO2 and HR 70 on RA.    Exercises     Assessment/Plan    PT Assessment Patient needs continued PT services  PT Problem List Decreased strength;Decreased activity tolerance;Decreased balance;Decreased mobility;Decreased cognition;Decreased knowledge of use of DME;Pain       PT Treatment Interventions DME instruction;Gait training;Stair training;Functional mobility training;Therapeutic activities;Therapeutic exercise;Balance training;Cognitive remediation;Neuromuscular re-education;Patient/family education    PT Goals (Current goals can be found in the Care Plan section)  Acute Rehab PT Goals Patient Stated Goal: unable PT Goal Formulation: With patient Time For Goal Achievement: 11/08/21 Potential to Achieve Goals:  (guarded)    Frequency Min 3X/week     Co-evaluation               AM-PAC PT "6 Clicks" Mobility  Outcome Measure Help needed turning from your back to your side while in a flat bed without using bedrails?: Total Help needed moving from lying on your back to sitting on the side of  a flat bed without using bedrails?: Total Help needed moving to and from a bed to a chair (including a wheelchair)?: Total Help needed standing up from a chair using your arms (e.g., wheelchair or bedside chair)?: Total Help needed to walk in hospital room?: Total Help needed climbing 3-5 steps with a railing? : Total 6 Click Score: 6    End of Session   Activity Tolerance: Other (comment) (Pt limited by cognitive  deficits and weakness) Patient left: in bed;with call bell/phone within reach;with bed alarm set Nurse Communication: Mobility status PT Visit Diagnosis: Other abnormalities of gait and mobility (R26.89);Muscle weakness (generalized) (M62.81)    Time: 1440-1520 PT Time Calculation (min) (ACUTE ONLY): 40 min   Charges:   PT Evaluation $PT Eval High Complexity: 1 High PT Treatments $Therapeutic Activity: 8-22 mins        Tana Coast, PT   [employer] 10/25/2021, 3:39 PM

## 2021-10-26 ENCOUNTER — Inpatient Hospital Stay (HOSPITAL_COMMUNITY): Payer: Medicare (Managed Care)

## 2021-10-26 DIAGNOSIS — R569 Unspecified convulsions: Secondary | ICD-10-CM | POA: Diagnosis not present

## 2021-10-26 DIAGNOSIS — R112 Nausea with vomiting, unspecified: Secondary | ICD-10-CM | POA: Diagnosis not present

## 2021-10-26 MED ORDER — PANTOPRAZOLE SODIUM 40 MG PO TBEC
40.0000 mg | DELAYED_RELEASE_TABLET | Freq: Every day | ORAL | Status: DC
  Administered 2021-10-27 – 2021-10-29 (×3): 40 mg via ORAL
  Filled 2021-10-26 (×3): qty 1

## 2021-10-26 MED ORDER — GADOBUTROL 1 MMOL/ML IV SOLN
9.0000 mL | Freq: Once | INTRAVENOUS | Status: AC | PRN
  Administered 2021-10-26: 9 mL via INTRAVENOUS

## 2021-10-26 NOTE — Progress Notes (Signed)
EEG lab contacted and notified of order for discontinuing pt's EEG monitoring.

## 2021-10-26 NOTE — Progress Notes (Signed)
Brief Neuro Update:  No seizures, no GPDs/Triphasic waves on EEG overnight. Will discontinue LTM. She should be on keppra 500mg  BID for atleast a few months. No driving for 6 months. Would recommend repeat EEG outpatient and if no epileptiform discharges, no seizures, can taper off Keppra. She should follow with outpatient neurology for this.  We will signoff. Plan discussed with Dr. in person.  Mahala Menghini [facility] Pager Number Erick Blinks

## 2021-10-26 NOTE — Progress Notes (Signed)
LTM EEG discontinued - no skin breakdown at unhook.   

## 2021-10-26 NOTE — Procedures (Signed)
Patient Name: Kristen Ramos  MRN: [id number]  Epilepsy Attending: Charlsie Quest  Referring Physician/Provider: Charlsie Quest, MD Duration: 10/25/2021 1645 to 10/26/2021 0915   Patient history: 61yo M with seizure like activity. EEG to evaluate for seizure   Level of alertness: awake, asleep   AEDs during EEG study: None   Technical aspects: This EEG study was done with scalp electrodes positioned according to the 10-20 International system of electrode placement. Electrical activity was reviewed with band pass filter of 1-70Hz , sensitivity of 7 uV/mm, display speed of 64mm/sec with a 60Hz  notched filter applied as appropriate. EEG data were recorded continuously and digitally stored.  Video monitoring was available and reviewed as appropriate.   Description:  The posterior dominant rhythm consists of 8 Hz activity of moderate voltage (25-35 uV) seen predominantly in posterior head regions, symmetric and reactive to eye opening and eye closing. Sleep was characterized by vertex waves, sleep spindles (12 to 14 Hz), maximal frontocentral region. EEG showed intermittent generalized rhythmic sharply contoured 2 to 3 Hz delta slowing.  Hyperventilation and photic stimulation were not performed.      ABNORMALITY -Intermittent rhythmic delta slow, generalized   IMPRESSION: This study is suggestive of mild diffuse encephalopathy.  No seizures or epileptiform discharges were seen throughout the recording.    Kristen Ramos 

## 2021-10-26 NOTE — Progress Notes (Signed)
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 Chronic tobacco Allergic rhinitis  Admit 8/12 upper abdominal pain nausea vomiting-admits to running out of meclizine recently and not taking PPI Elavil regularly  Found to have potassium 3.4 phosphorus 1.2-was treated with meclizine Zofran Protonix Carafate Ativan Haldol and found to have starvation ketosis consistent with her chronic cyclical vomiting 8/14 p.m.-found to have seizure lasting 2 minutes 50 seconds-EEG ordered--neurology consulted   Hospital-Problem based course  Clinical vomiting syndrome Starvation ketosis Tolerating diet now Graduated to regular today CT abdomen pelvis previously showed no new findings but tiny umbilical hernia DC IVF oxycodone as Oxy IR 15 every 6 as needed severe pain Seizure-like activity on 8/14 EEG long-term is pending--preliminary shows mild diffuse encephalopathy with no seizures or epileptiform discharges and EEG.   Neurology recommends indefinite Keppra 500 twice daily MRI is ordered and pending Driving recommendations as per neurology Hypokalemia on admission Given runs of potassium 8/15 now corrected Periodic labs Rheumatoid arthritis with finger deformities Methotrexate on hold resume as an outpatient  DVT prophylaxis: Lovenox Code Status: Full Family Communication: None present Disposition:  Status is: Inpatient Remains inpatient appropriate because: Requires further work-up including MRI brain  Will need skilled nursing facility placement on discharge likely in 24 to 48 hours   Consultants:  Neurology  Procedures: No  Antimicrobials: No   Subjective: Overall seems improved and  no distress She is more awake She is able to tolerate diet she has mild nausea but otherwise is fine    Objective: Vitals:   10/25/21 2022 10/25/21 2245 10/26/21 0519 10/26/21 0908  BP: (!) 142/88  (!) 147/84 (!) 153/77  Pulse: (!) 106  80 83  Resp: 18  18 16   Temp: 98.3 F (36.8 C)  98.4 F (36.9 C) 98 F (36.7 C)  TempSrc: Oral  Oral Oral  SpO2: 98%  100% 95%  Weight:   90.2 kg   Height:  5\' 6"  (1.676 m)      Intake/Output Summary (Last 24 hours) at 10/26/2021 1149 Last data filed at 10/26/2021 0216 Gross per 24 hour  Intake 1638.28 ml  Output --  Net 1638.28 ml    Filed Weights   10/25/21 0500 10/26/21 0519  Weight: 76.2 kg 90.2 kg    Examination:  EOMI NCAT no focal deficit Chest clear S1-S2 no murmur Moving 4 limbs equally without distress Abdomen soft n without rebound no guarding ROM is intact, no focal deficit  Data Reviewed: personally reviewed   CBC    Component Value Date/Time   WBC 8.8 10/24/2021 0208   RBC 4.57 10/24/2021 0208   HGB 14.5 10/24/2021 0208   HCT 41.4 10/24/2021 0208   PLT 297 10/24/2021 0208   MCV 90.6 10/24/2021 0208   MCH 31.7 10/24/2021 0208   MCHC 35.0 10/24/2021 0208   RDW 13.5 10/24/2021 0208   LYMPHSABS 1.1 10/20/2021 1128   MONOABS 0.3 10/20/2021 1128   EOSABS 0.0 10/20/2021 1128   BASOSABS 0.0 10/20/2021 1128      Latest Ref Rng & Units 10/25/2021    2:08 AM 10/24/2021    2:08 AM 10/23/2021    7:52 AM  CMP  Glucose 70 - 99 mg/dL 80  93  97   BUN 6 -  20 mg/dL Creatinine 0.44 - 1.00 mg/dL 1.61  0.96  0.45   Sodium 135 - 145 mmol/L 136  135  135   Potassium 3.5 - 5.1 mmol/L 3.6  3.1  3.2   Chloride 98 - 111 mmol/L 106  105  101   CO2 22 - 32 mmol/L Calcium 8.9 - 10.3 mg/dL 8.4  8.7  8.9      Radiology Studies: Overnight EEG with video  Result Date: 10/25/2021 Charlsie Quest, MD     10/26/2021 11:42 AM Patient Name: Kristen Ramos MRN: [id number] Epilepsy Attending: Charlsie Quest  Referring Physician/Provider: Charlsie Quest, MD Duration: 10/24/2021 1645 to 10/25/2021 1645  Patient history: 61yo M with seizure like activity. EEG to evaluate for seizure  Level of alertness: awake, asleep  AEDs during EEG study: None  Technical aspects: This EEG study was done with scalp electrodes positioned according to the 10-20 International system of electrode placement. Electrical activity was reviewed with band pass filter of 1-70Hz , sensitivity of 7 uV/mm, display speed of 45mm/sec with a  notched filter applied as appropriate. EEG data were recorded continuously and digitally stored.  Video monitoring was available and reviewed as appropriate.  Description:  The posterior dominant rhythm consists of 8 Hz activity of moderate voltage (25-35 uV) seen predominantly in posterior head regions, symmetric and reactive to eye opening and eye closing. Sleep was characterized by vertex waves, sleep spindles (12 to 14 Hz), maximal frontocentral region. EEG showed intermittent generalized rhythmic sharply contoured 2 to 3 Hz delta slowing.  Hyperventilation and photic stimulation were not performed.    ABNORMALITY -Intermittent rhythmic delta slow, generalized  IMPRESSION: This study is suggestive of mild diffuse encephalopathy.  No seizures or epileptiform discharges were seen throughout the recording. EEG appears to be improved compared to previous day.  Charlsie Quest   EEG adult  Result Date: 10/24/2021 Charlsie Quest, MD     10/24/2021  3:08 PM Patient Name: Kristen Ramos MRN: [id number] Epilepsy Attending: Charlsie Quest Referring Physician/Provider: Joseph Art, DO Date: 10/24/2021 Duration: 24.38 mins Patient history: 61yo M with seizure like activity. EEG to evaluate for seizure Level of alertness: lethargic AEDs during EEG study: None Technical aspects: This EEG study was done with scalp electrodes positioned according to the 10-20 International system of electrode placement. Electrical  activity was reviewed with band pass filter of 1-70Hz , sensitivity of 7 uV/mm, display speed of 32mm/sec with a  notched filter applied as appropriate. EEG data were recorded continuously and digitally stored.  Video monitoring was available and reviewed as appropriate. Description: No clear posterior dominant rhythm was seen. EEG showed continuous generalized 5-7hz  theta slowing admixed with intermittent generalized 2-3Hz  delta slowing. Generalized periodic discharges with triphasic morphology at 2.5-3 Hz were also noted intermittently. Hyperventilation and photic stimulation were not performed.    ABNORMALITY - Periodic discharges with triphasic morphology, generalized ( GPDs) - Continuous slow, generalized  IMPRESSION: This study generalized periodic discharges with triphasic morphology which can be on the ictal-interictal continuum. Recommend long term eeg monitoring for further evaluation.  Additionally there is evidence of moderate diffuse encephalopathy.  No seizures were seen throughout the recording. Priyanka Annabelle Harman     Scheduled Meds:  enoxaparin (LOVENOX) injection  40 mg Subcutaneous Daily   pantoprazole (PROTONIX) IV  40 mg Intravenous Q24H   sodium chloride flush  3 mL Intravenous Q12H   Continuous  Infusions:  levETIRAcetam 500 mg (10/26/21 1042)     LOS: 4 days   Time spent: 94  Rhetta Mura, MD [facility] To contact the attending provider between 7A-7P or the covering provider during after hours 7P-7A, please log into the web site [email or web address] and access using universal [place] password for that web site. If you do not have the password, please call the hospital operator.  10/26/2021, 11:49 AM

## 2021-10-27 DIAGNOSIS — R112 Nausea with vomiting, unspecified: Secondary | ICD-10-CM | POA: Diagnosis not present

## 2021-10-27 MED ORDER — LEVETIRACETAM 500 MG PO TABS: 500.0000 mg | ORAL_TABLET | Freq: Two times a day (BID) | ORAL | 0 refills | Status: DC

## 2021-10-27 MED ORDER — OXYCODONE HCL 15 MG PO TABS
15.0000 mg | ORAL_TABLET | Freq: Four times a day (QID) | ORAL | 0 refills | Status: AC | PRN
Start: 2021-10-27 — End: ?

## 2021-10-27 NOTE — TOC Progression Note (Signed)
Transition of Care [facility]) - Progression Note    Patient Details  Name: Kristen Ramos MRN: [id number] Date of Birth: 10/08/1960  Transition of Care [facility]) CM/SW Contact  Carley Hammed, [place] Phone Number: 10/27/2021, 3:57 PM  Clinical Narrative:    CSW spoke with pt's daughter and confirmed they have chosen [facility] for SNF. CSW attempted to contact [facility] to request they start authorization. Voicemail was left.   Expected Discharge Plan: Skilled Nursing Facility Barriers to Discharge: Continued Medical Work up  Expected Discharge Plan and Services Expected Discharge Plan: Skilled Nursing Facility         Expected Discharge Date: 10/27/21                                     Social Determinants of Health (SDOH) Interventions    Readmission Risk Interventions     No data to display

## 2021-10-27 NOTE — TOC Progression Note (Addendum)
Transition of Care [facility]) - Progression Note    Patient Details  Name: Kristen Ramos MRN: [id number] Date of Birth: December 27, 1960  Transition of Care [facility]) CM/SW Contact  Ivette Loyal, [place] Phone Number: 10/27/2021, 4:30 PM  Clinical Narrative:    [facility] pt but will not able to accept pt until Monday due to insurance auth.    Expected Discharge Plan: Skilled Nursing Facility Barriers to Discharge: Continued Medical Work up  Expected Discharge Plan and Services Expected Discharge Plan: Skilled Nursing Facility         Expected Discharge Date: 10/27/21                                     Social Determinants of Health (SDOH) Interventions    Readmission Risk Interventions     No data to display

## 2021-10-27 NOTE — Discharge Summary (Signed)
Physician Discharge Summary  Kristen Ramos AVW:[id number] DOB: 02/25/1961 DOA: 10/20/2021  PCP: Jordan Hawks, PA-C  Admit date: 10/20/2021 Discharge date: 10/27/2021  Time spent: 35 minutes  Recommendations for Outpatient Follow-up:  Patient will need methotrexate  reduction dosing in the outpatient setting by rheumatologist-daughter made aware and will need to ensure that this is done Patient is not supposed to drive for the next 6 months given history of seizure this hospital stay will be on Keppra for the next 2 to 3 months and then this can gradually be tapered and weaned with referral to neurology directing this Will need screening labs Chem-12 CBC in about 1 week  Discharge Diagnoses:  MAIN problem for hospitalization   Press syndrome Seizure x1 during hospital stay Starvation ketosis Cyclical vomiting syndrome  Please see below for itemized issues addressed in HOpsital- refer to other progress notes for clarity if needed  Discharge Condition: Improved  Diet recommendation: Regular  Filed Weights   10/25/21 0500 10/26/21 0519 10/27/21 0419  Weight: 76.2 kg 90.2 kg 90.1 kg    History of present illness:  61 year old female Known chronic nausea vomiting (cyclical vomiting) wound care [facility] 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 Chronic tobacco Allergic rhinitis   Admit 8/12 upper abdominal pain nausea vomiting-admits to running out of meclizine recently and not taking PPI Elavil regularly   Found to have potassium 3.4 phosphorus 1.2-was treated with meclizine Zofran Protonix Carafate Ativan Haldol and found to have starvation ketosis consistent with her chronic cyclical vomiting 8/14 p.m.-found to have seizure lasting 2 minutes 50 seconds-EEG ordered--neurology consulted  Hospital Course:  Clinical vomiting syndrome Starvation ketosis Tolerated regular diet since 8/17  without any further nausea vomiting-fluids were discontinued 8/17 CT abdomen pelvis previously showed no new findings but tiny umbilical hernia Resuming on discharge oxycodone as Oxy IR 15 every 6 as needed severe pain Seizure-like activity on 8/14 EEG long-term  shows mild diffuse encephalopathy with no seizures or epileptiform discharges and EEG.   Neurology recommends Keppra 500 twice daily for several months and then discussion about tapering Possible press syndrome seen on MRI brain 8/17 Neurology felt this could be secondary to methotrexate the patient does not have a history of hypertension No other specific recommendations made Hypokalemia on admission Patient given potassium during hospital stay-potassium has been resolved Rheumatoid arthritis with finger deformities Methotrexate on hold resume as an outpatient Neurology had recommended dosage adjustments in the outpatient setting as this could have been a precipitant to press syndrome   Discharge Exam: Vitals:   10/27/21 0401 10/27/21 0745  BP: 123/70 135/74  Pulse: 76 85  Resp: 17 17  Temp: 97.6 F (36.4 C) 98.2 F (36.8 C)  SpO2: 100% 96%    Subj on day of d/c   Awake coherent no distress No chills rigors Sitting in chair  General Exam on discharge  EOMI NCAT no focal deficit slightly slow responses Chest is clear no rales rhonchi S1-S2 no murmur ROM intact moving 4 limbs but slightly weak  Discharge Instructions   Discharge Instructions     Diet - low sodium heart healthy   Complete by: As directed    Increase activity slowly   Complete by: As directed       Allergies as of 10/27/2021       Reactions   Bee Venom Swelling        Medication List     STOP taking  these medications    emtricitabine-tenofovir 200-300 MG tablet Commonly known as: TRUVADA   Horizant 600 MG Tbcr Generic drug: Gabapentin Enacarbil   metoCLOPramide 10 MG tablet Commonly known as: Reglan       TAKE these  medications    folic acid 1 MG tablet Commonly known as: FOLVITE Take 1 mg by mouth daily.   levETIRAcetam 500 MG tablet Commonly known as: Keppra Take 1 tablet (500 mg total) by mouth 2 (two) times daily.   meclizine 25 MG tablet Commonly known as: ANTIVERT Take 1 tablet (25 mg total) by mouth 3 (three) times daily as needed for dizziness. What changed: reasons to take this   methotrexate 2.5 MG tablet Commonly known as: RHEUMATREX Take 15 mg by mouth every Friday.   ondansetron 4 MG disintegrating tablet Commonly known as: ZOFRAN-ODT Take 4 mg by mouth every 8 (eight) hours as needed for nausea/vomiting.   oxyCODONE 15 MG immediate release tablet Commonly known as: ROXICODONE Take 1 tablet (15 mg total) by mouth 4 (four) times daily as needed for pain.   pantoprazole 40 MG tablet Commonly known as: PROTONIX Take 1 tablet (40 mg total) by mouth daily.   polyethylene glycol 17 g packet Commonly known as: MIRALAX / GLYCOLAX Take 17 g by mouth daily. What changed:  when to take this reasons to take this   VITAMIN D-3 PO Take 1 capsule by mouth daily.       Allergies  Allergen Reactions   Bee Venom Swelling      The results of significant diagnostics from this hospitalization (including imaging, microbiology, ancillary and laboratory) are listed below for reference.    Significant Diagnostic Studies: MR BRAIN W WO CONTRAST  Result Date: 10/26/2021 CLINICAL DATA:  Seizure, new-onset, no history of trauma EXAM: MRI HEAD WITHOUT AND WITH CONTRAST TECHNIQUE: Multiplanar, multiecho pulse sequences of the brain and surrounding structures were obtained without and with intravenous contrast. CONTRAST:  17mL GADAVIST GADOBUTROL 1 MMOL/ML IV SOLN COMPARISON:  April 2022 FINDINGS: Brain: There is no reduced diffusion. No evidence of hemorrhage. Patchy subcortical T2 hyperintensity in the occipital lobes and parietal lobes. There also likely some patchy frontal lobe  involvement that is new from the prior study. Ventricles and sulci are stable in size and configuration. There is no intracranial mass or mass effect. No hydrocephalus or extra-axial collection. Vascular: Major vessel flow voids at the skull base are preserved. Skull and upper cervical spine: Normal marrow signal is preserved. Sinuses/Orbits: Minor mucosal thickening.  Orbits are unremarkable. Other: Sella is unremarkable. Trace mastoid fluid opacification. Incidental retention or Tornwaldt cyst of the posterior nasopharyngeal wall. IMPRESSION: New patchy subcortical T2 hyperintensity in the parietooccipital lobes and probably frontal lobes. Consider PRES. Electronically Signed   By: Guadlupe Spanish M.D.   On: 10/26/2021 16:39   Overnight EEG with video  Result Date: 10/25/2021 Charlsie Quest, MD     10/26/2021 11:42 AM Patient Name: Eleanna Theilen MRN: [id number] Epilepsy Attending: Charlsie Quest Referring Physician/Provider: Charlsie Quest, MD Duration: 10/24/2021 1645 to 10/25/2021 1645  Patient history: 61yo M with seizure like activity. EEG to evaluate for seizure  Level of alertness: awake, asleep  AEDs during EEG study: None  Technical aspects: This EEG study was done with scalp electrodes positioned according to the 10-20 International system of electrode placement. Electrical activity was reviewed with band pass filter of 1-70Hz , sensitivity of 7 uV/mm, display speed of 63mm/sec with a 60Hz  notched filter applied as appropriate. EEG  data were recorded continuously and digitally stored.  Video monitoring was available and reviewed as appropriate.  Description:  The posterior dominant rhythm consists of 8 Hz activity of moderate voltage (25-35 uV) seen predominantly in posterior head regions, symmetric and reactive to eye opening and eye closing. Sleep was characterized by vertex waves, sleep spindles (12 to 14 Hz), maximal frontocentral region. EEG showed intermittent generalized rhythmic sharply  contoured 2 to 3 Hz delta slowing.  Hyperventilation and photic stimulation were not performed.    ABNORMALITY -Intermittent rhythmic delta slow, generalized  IMPRESSION: This study is suggestive of mild diffuse encephalopathy.  No seizures or epileptiform discharges were seen throughout the recording. EEG appears to be improved compared to previous day.  Charlsie Quest   EEG adult  Result Date: 10/24/2021 Charlsie Quest, MD     10/24/2021  3:08 PM Patient Name: Tamakia Porto MRN: [id number] Epilepsy Attending: Charlsie Quest Referring Physician/Provider: Joseph Art, DO Date: 10/24/2021 Duration: 24.38 mins Patient history: 61yo M with seizure like activity. EEG to evaluate for seizure Level of alertness: lethargic AEDs during EEG study: None Technical aspects: This EEG study was done with scalp electrodes positioned according to the 10-20 International system of electrode placement. Electrical activity was reviewed with band pass filter of 1-70Hz , sensitivity of 7 uV/mm, display speed of 71mm/sec with a 60Hz  notched filter applied as appropriate. EEG data were recorded continuously and digitally stored.  Video monitoring was available and reviewed as appropriate. Description: No clear posterior dominant rhythm was seen. EEG showed continuous generalized 5-7hz  theta slowing admixed with intermittent generalized 2-3Hz  delta slowing. Generalized periodic discharges with triphasic morphology at 2.5-3 Hz were also noted intermittently. Hyperventilation and photic stimulation were not performed.    ABNORMALITY - Periodic discharges with triphasic morphology, generalized ( GPDs) - Continuous slow, generalized  IMPRESSION: This study generalized periodic discharges with triphasic morphology which can be on the ictal-interictal continuum. Recommend long term eeg monitoring for further evaluation.  Additionally there is evidence of moderate diffuse encephalopathy.  No seizures were seen throughout the recording.    CT ABDOMEN PELVIS W CONTRAST  Result Date: 10/23/2021 CLINICAL DATA:  Intractable nausea and vomiting with chronic cyclical vomiting syndrome, dehydration, ketosis EXAM: CT ABDOMEN AND PELVIS WITH CONTRAST TECHNIQUE: Multidetector CT imaging of the abdomen and pelvis was performed using the standard protocol following bolus administration of intravenous contrast. RADIATION DOSE REDUCTION: This exam was performed according to the departmental dose-optimization program which includes automated exposure control, adjustment of the mA and/or kV according to patient size and/or use of iterative reconstruction technique. CONTRAST:  10/25/2021 OMNIPAQUE IOHEXOL 300 MG/ML SOLN IV. No oral contrast. COMPARISON:  06/19/2019 FINDINGS: Lower chest: Lung bases clear Hepatobiliary: Gallbladder and liver normal appearance. Slightly prominent CBD 11 mm diameter, unchanged. Pancreas: Normal appearance Spleen: Normal appearance Adrenals/Urinary Tract: LEFT adrenal thickening without discrete mass. Unremarkable RIGHT adrenal gland and kidneys. No renal mass, hydronephrosis, ureteral dilatation, or urinary tract calcification. Bladder unremarkable. Stomach/Bowel: Normal appendix. Stomach and bowel loops normal appearance Vascular/Lymphatic: Atherosclerotic calcifications aorta and iliac arteries without aneurysm. No adenopathy. Reproductive: Uterus surgically absent. Small amount of fluid in vagina. Ovaries unremarkable. Other: No free air or free fluid. Tiny umbilical hernia containing fat. Foci of subcutaneous gas anterior abdominal wall question medication injection site Musculoskeletal: Prior lumbar fusion.  No acute osseous findings. IMPRESSION: No acute intra-abdominal or intrapelvic abnormalities. Tiny umbilical hernia containing fat. Slightly prominent CBD 11 mm diameter, unchanged, correlate with LFTs. Aortic  Atherosclerosis (ICD10-I70.0). Electronically Signed   By: Ulyses Southward M.D.   On: 10/23/2021 15:13    DG Abd 1 View  Result Date: 10/22/2021 CLINICAL DATA:  Nausea and vomiting. EXAM: ABDOMEN - 1 VIEW COMPARISON:  None Available. FINDINGS: No free air, portal venous gas, or pneumatosis. The bowel gas pattern is unremarkable no evidence of obstruction. IMPRESSION: No cause for symptoms identified.  No evidence of obstruction. Electronically Signed   By: Gerome Sam III M.D.   On: 10/22/2021 08:30   CT HEAD WO CONTRAST ( )  Result Date: 10/21/2021 CLINICAL DATA:  Altered mental status. EXAM: CT HEAD WITHOUT CONTRAST TECHNIQUE: Contiguous axial images were obtained from the base of the skull through the vertex without intravenous contrast. RADIATION DOSE REDUCTION: This exam was performed according to the departmental dose-optimization program which includes automated exposure control, adjustment of the mA and/or kV according to patient size and/or use of iterative reconstruction technique. COMPARISON:  None Available. FINDINGS: Brain: No evidence of acute infarction, hemorrhage, hydrocephalus, extra-axial collection or mass lesion/mass effect. Vascular: No hyperdense vessel or unexpected calcification. Skull: Normal. Negative for fracture or focal lesion. Sinuses/Orbits: No acute finding. Other: It should be noted that the study is limited secondary to marked severity patient motion and subsequent artifact. IMPRESSION: 1. Limited study secondary to marked severity patient motion and subsequent artifact. 2. No acute intracranial abnormality. Electronically Signed   By: Aram Candela M.D.   On: 10/21/2021 03:10    Microbiology: No results found for this or any previous visit (from the past 240 hour(s)).   Labs: Basic Metabolic Panel: Recent Labs  Lab 10/20/21 1128 10/21/21 0729 10/21/21 0737 10/22/21 0051 10/23/21 0752 10/24/21 0208 10/25/21 0208  NA 138 134*  --  136 135 135 136  K 3.4* 4.0  --  3.6 3.2* 3.1* 3.6  CL 107 103  --  105 101 105 106  CO2 17* 19*  --  20* 21* 21* 20*   GLUCOSE 136* 122*  --  115* 97 93 80  BUN <5* 7  --  CREATININE 0.82 0.65  --  0.70 0.67 0.74 0.69  CALCIUM 9.5 9.3  --  9.0 8.9 8.7* 8.4*  MG 1.9  --   --  1.8  --  1.9  --   PHOS 1.2*  --  3.5 2.9  --   --   --    Liver Function Tests: Recent Labs  Lab 10/20/21 1128  AST 22  ALT 14  ALKPHOS 86  BILITOT 0.7  PROT 8.2*  ALBUMIN 3.9   No results for input(s): "LIPASE", "AMYLASE" in the last 168 hours. No results for input(s): "AMMONIA" in the last 168 hours. CBC: Recent Labs  Lab 10/20/21 1128 10/22/21 0406 10/24/21 0208  WBC 5.2 8.2 8.8  NEUTROABS 3.6  --   --   HGB 14.5 13.5 14.5  HCT 42.1 38.0 41.4  MCV 90.9 90.0 90.6  PLT 338 297 297   Cardiac Enzymes: No results for input(s): "CKTOTAL", "CKMB", "CKMBINDEX", "TROPONINI" in the last 168 hours. BNP: BNP (last 3 results) No results for input(s): "BNP" in the last 8760 hours.  ProBNP (last 3 results) No results for input(s): "PROBNP" in the last 8760 hours.  CBG: Recent Labs  Lab 10/23/21 2140  GLUCAP 101*       Signed:  Rhetta Mura MD   [facility] 10/27/2021, 10:05 AM

## 2021-10-27 NOTE — TOC Initial Note (Addendum)
Transition of Care [facility]) - Initial/Assessment Note    Patient Details  Name: Kristen Ramos MRN: [id number] Date of Birth: December 07, 1960  [facility] CM/SW Contact:    Lynett Grimes Phone Number: 10/27/2021, 11:11 AM  Clinical Narrative:                 CSW received SNF consult. CSWspoke with pt daughter via phone. CSW introduced self and explained role at the hospital. Pt reports that PTA the pt was from home alone. PT reports pt is maxA/totalA. Pt has a click score of 6 and has extreme weakness.  CSW reviewed PT/OT recommendations for SNF. Pt is agreeable to SNF and her daughter is leading in making the decision for her SNF.CSW has faxed pt out.  CSW will continue to follow.    Expected Discharge Plan: Skilled Nursing Facility Barriers to Discharge: Continued Medical Work up   Patient Goals and CMS Choice Patient states their goals for this hospitalization and ongoing recovery are:: Rehab CMS Medicare.gov Compare Post Acute Care list provided to:: Patient Choice offered to / list presented to : Patient  Expected Discharge Plan and Services Expected Discharge Plan: Skilled Nursing Facility         Expected Discharge Date: 10/27/21                                    Prior Living Arrangements/Services     Patient language and need for interpreter reviewed:: Yes        Need for Family Participation in Patient Care: Yes (Comment) Care giver support system in place?: Yes (comment)   Criminal Activity/Legal Involvement Pertinent to Current Situation/Hospitalization: No - Comment as needed  Activities of Daily Living Home Assistive Devices/Equipment: None ADL Screening (condition at time of admission) Patient's cognitive ability adequate to safely complete daily activities?: Yes Is the patient deaf or have difficulty hearing?: No Does the patient have difficulty seeing, even when wearing glasses/contacts?: No Does the patient have difficulty  concentrating, remembering, or making decisions?: Yes Patient able to express need for assistance with ADLs?: Yes Does the patient have difficulty dressing or bathing?: No Independently performs ADLs?: Yes (appropriate for developmental age) Does the patient have difficulty walking or climbing stairs?: Yes Weakness of Legs: Both Weakness of Arms/Hands: Both  Permission Sought/Granted      Share Information with NAME: Cruz,Shawnette (Daughter)   [phone]  Permission granted to share info w AGENCY: SNF  Permission granted to share info w Relationship: Cruz,Shawnette (Daughter)   [phone]  Permission granted to share info w Contact Information: Cruz,Shawnette (Daughter)   [phone]  Emotional Assessment       Orientation: : Oriented to Place, Oriented to Self, Oriented to  Time, Oriented to Situation Alcohol / Substance Use: Not Applicable Psych Involvement: No (comment)  Admission diagnosis:  Vertigo [R42] Cyclic vomiting syndrome [R11.15] Intractable nausea and vomiting [R11.2] Patient Active Problem List   Diagnosis Date Noted   Cyclic vomiting syndrome    AKI (acute kidney injury) (HCC) 03/25/2021   Allergic rhinitis    GERD (gastroesophageal reflux disease)    Tobacco abuse    Intractable cyclical vomiting 06/16/2019   Chronic back pain    Hypokalemia    Impaired fasting blood sugar    Abdominal pain 04/15/2018   Nausea & vomiting 04/15/2018   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

## 2021-10-27 NOTE — Progress Notes (Signed)
Occupational Therapy Treatment Patient Details Name: Kristen Ramos MRN: [id number] DOB: 1960/04/25 Today's Date: 10/27/2021   History of present illness Pt is a 61 y/o female who presented to ED with upper abdominal pain, nausea and vomiting suspicious for cyclical vomiting syndrome. During admission, pt experienced seizure-like activity with abnormal EEG and further work up pending including MRI of brain. PMH: chronic back pain, GERD, RA.   OT comments  Pt has met 2/4 OT goals today, upgraded accordingly. Pt much more alert and participatory today though impaired motor planning and initiation present. Overall, pt requires Min A for bed mobility, Mod A for basic transfers w/ balance deficits (also contributed to prolonged time in bed). Pt able to complete basic feeding/grooming today though physical assistance still required for bathing, dressing and toileting. Unable to progress mobility safely at this time as no RW available in room during session. Continue to rec SNF rehab though with consistent mobility, pt may be able to progress to [place].    Recommendations for follow up therapy are one component of a multi-disciplinary discharge planning process, led by the attending physician.  Recommendations may be updated based on patient status, additional functional criteria and insurance authorization.    Follow Up Recommendations  Skilled nursing-short term rehab (<3 hours/day)    Assistance Recommended at Discharge Frequent or constant Supervision/Assistance  Patient can return home with the following  A lot of help with walking and/or transfers;A lot of help with bathing/dressing/bathroom;Direct supervision/assist for medications management;Direct supervision/assist for financial management;Assist for transportation   Equipment Recommendations  Other (comment);BSC/3in1 (RW)    Recommendations for Other Services      Precautions / Restrictions Precautions Precautions:  Fall Restrictions Weight Bearing Restrictions: No       Mobility Bed Mobility Overal bed mobility: Needs Assistance Bed Mobility: Supine to Sit       Sit to supine: Min assist, HOB elevated   General bed mobility comments: assist to scoot hips EOB and lift trunk. cues for initiation    Transfers Overall transfer level: Needs assistance Equipment used: 1 person hand held assist Transfers: Sit to/from Stand, Bed to chair/wheelchair/BSC Sit to Stand: Mod assist     Step pivot transfers: Mod assist     General transfer comment: No RW present in room, guided pt in sit to stand locking elbows with therapist at mod A and tactile cues to tuck bottom in. multiple trials to sustain and then able to step to recliner reaching to armrest and cues for steps     Balance Overall balance assessment: Needs assistance Sitting-balance support: Single extremity supported Sitting balance-Leahy Scale: Fair     Standing balance support: During functional activity Standing balance-Leahy Scale: Poor Standing balance comment: reliant on UE support in standing                           ADL either performed or assessed with clinical judgement   ADL Overall ADL's : Needs assistance/impaired Eating/Feeding: Independent;Sitting Eating/Feeding Details (indicate cue type and reason): able to open containers, self feed up in chair today Grooming: Supervision/safety;Sitting;Wash/dry hands;Wash/dry face           Upper Body Dressing : Moderate assistance;Sitting Upper Body Dressing Details (indicate cue type and reason): donning gown around back, cues for sequencing                   General ADL Comments: Progressing cognitively and physically, able to progress OOB and complete basic  UB tasks today. 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, pt reports this is arthritic deformities RUE Coordination: decreased fine motor LUE Deficits / Details: 1 PIP contracture LUE Coordination: decreased fine motor   Lower Extremity Assessment Lower Extremity Assessment: Defer to PT evaluation        Vision   Vision Assessment?: No apparent visual deficits   Perception     Praxis      Cognition Arousal/Alertness: Awake/alert Behavior During Therapy: Flat affect Overall Cognitive Status: No family/caregiver present to determine baseline cognitive functioning Area of Impairment: Attention, Following commands, Safety/judgement, Awareness, Problem solving                   Current Attention Level: Selective   Following Commands: Follows one step commands with increased time, Follows one step commands consistently Safety/Judgement: Decreased awareness of safety, Decreased awareness of deficits Awareness: Intellectual Problem Solving: Slow processing, Decreased initiation, Difficulty sequencing, Requires verbal cues, Requires tactile cues General Comments: able to answer orientation questions, flat affect. follows directions with improved ability today though still motor planning and initiation deficits noted.        Exercises      Shoulder Instructions       General Comments      Pertinent Vitals/ Pain       Pain Assessment Pain Assessment: No/denies pain  Home Living                                          Prior Functioning/Environment              Frequency  Min 2X/week        Progress Toward Goals  OT Goals(current goals can now be found in the care plan section)  Progress towards OT goals: Progressing toward goals  Acute Rehab OT Goals Patient Stated Goal: be able to go home OT Goal Formulation: With patient Time For Goal Achievement: 11/08/21 Potential to Achieve Goals: Good  Plan Discharge plan remains appropriate     Co-evaluation                 AM-PAC OT "6 Clicks" Daily Activity     Outcome Measure   Help from another person eating meals?: None Help from another person taking care of personal grooming?: A Little Help from another person toileting, which includes using toliet, bedpan, or urinal?: A Lot Help from another person bathing (including washing, rinsing, drying)?: A Lot Help from another person to put on and taking off regular upper body clothing?: A Lot Help from another person to put on and taking off regular lower body clothing?: A Lot 6 Click Score: 15    End of Session Equipment Utilized During Treatment: Gait belt  OT Visit Diagnosis: Other abnormalities of gait and mobility (R26.89);Muscle weakness (generalized) (M62.81);Other symptoms and signs involving cognitive function   Activity Tolerance Patient tolerated treatment well   Patient Left in chair;with call bell/phone within reach;with chair alarm set (rehab tech able to locate chair alarm box after session to connect)   Nurse Communication Mobility status        Time: 9678-9381 OT Time Calculation (min): 25 min  Charges: OT General Charges $OT Visit: 1 Visit OT Treatments $Self Care/Home  Management : 8-22 mins $Therapeutic Activity: 8-22 mins  Malachy Chamber, OTR/L Acute Rehab Services Office: [phone]   Layla Maw 10/27/2021, 9:20 AM

## 2021-10-27 NOTE — Progress Notes (Signed)
NEUROLOGY CONSULTATION PROGRESS NOTE   Date of service: October 27, 2021 Patient Name: Kristen Ramos MRN:  [id number] DOB:  05-27-1960  Interval Hx   No issues. Working with therapy and sitting in a chair.  Vitals   Vitals:   10/26/21 2000 10/27/21 0401 10/27/21 0419 10/27/21 0745  BP: 115/65 123/70  135/74  Pulse: 84 76  85  Resp: 18 17  17   Temp:  97.6 F (36.4 C)  98.2 F (36.8 C)  TempSrc: Oral Oral  Oral  SpO2: 100% 100%  96%  Weight:   90.1 kg   Height:         Body mass index is 32.06 kg/m.  Physical Exam   General: sitting comfortably in chair; in no acute distress.  HENT: Normal oropharynx and mucosa. Normal external appearance of ears and nose.  Neck: Supple, no pain or tenderness  CV: No JVD. No peripheral edema.  Pulmonary: Symmetric Chest rise. Normal respiratory effort.  Abdomen: Soft to touch, non-tender.  Ext: No cyanosis, edema, or deformity  Skin: No rash. Normal palpation of skin.   Musculoskeletal: Normal digits and nails by inspection. No clubbing.   Neurologic Examination  Mental status/Cognition: Alert, oriented to self, place, month and year, good attention.  Speech/language: Fluent, comprehension intact, object naming intact, repetition intact. Cranial nerves:   CN II Pupils equal and reactive to light, no VF deficits    CN III,IV,VI EOM intact, no gaze preference or deviation, no nystagmus    CN V normal sensation in V1, V2, and V3 segments bilaterally    CN VII no asymmetry, no nasolabial fold flattening    CN VIII normal hearing to speech    CN IX & X normal palatal elevation, no uvular deviation    CN XI 5/5 head turn and 5/5 shoulder shrug bilaterally    CN XII midline tongue protrusio   Motor:  Muscle bulk: normal, tone normal, pronator drift none tremor none Moves all extremities spontaneously and antigravity and able to stand up from the chair  Sensation:  Light touch Intact BL   Pin prick    Temperature    Vibration    Proprioception    Coordination/Complex Motor:  - Finger to Nose are normal  Labs   Basic Metabolic Panel:  Lab Results  Component Value Date   NA 136 10/25/2021   K 3.6 10/25/2021   CO2 20 (L) 10/25/2021   GLUCOSE 80 10/25/2021   BUN 7 10/25/2021   CREATININE 0.69 10/25/2021   CALCIUM 8.4 (L) 10/25/2021   GFRNONAA >60 10/25/2021   GFRAA >60 06/22/2019   HbA1c:  Lab Results  Component Value Date   HGBA1C 5.9 (H) 06/19/2019   LDL:  Lab Results  Component Value Date   LDLCALC 185 (H) 07/16/2018   Urine Drug Screen:     Component Value Date/Time   LABOPIA POSITIVE (A) 03/25/2021 2308   COCAINSCRNUR NONE DETECTED 03/25/2021 2308   LABBENZ NONE DETECTED 03/25/2021 2308   AMPHETMU NONE DETECTED 03/25/2021 2308   THCU NONE DETECTED 03/25/2021 2308   LABBARB NONE DETECTED 03/25/2021 2308    Alcohol Level     Component Value Date/Time   ETH <10 10/04/2018 0535   No results found for: "PHENYTOIN", "ZONISAMIDE", "LAMOTRIGINE", "LEVETIRACETA" No results found for: "PHENYTOIN", "PHENOBARB", "VALPROATE", "CBMZ"  Imaging and Diagnostic studies   MRI 10/06/2018 with and without contrast: New patchy subcortical T2 hyperintensity in the parietooccipital lobes and probably frontal lobes. Consider PRES.  Impression  Kristen Ramos is a 62 y.o. female admitted with cyclic vomiting. She has a first time seizure in hospital with GPD/triphasic waves on EEG and LTM with no further seizures and resolution of GPDs/Triphasic waves. MRI Brain without contrast concerning for PRES which explains the GPDs on cEEG. I suspect that this is likely due to a combination of  elevated BP on arrival to 170s systolic and the fact that she is on Methotrexate.  Recommendations  - Would recommend continuing Keppra 500mg  BID.  - Repeat MRI Brain without contrast in 3 months.  - Strict blood pressure control.  - Hold Methotrexate for now and would recommend discussion with her outpatient team about  alternative or attempting resuming at a lower dose. - follow up with neurology outpatient. Outpatient neurology can consider repeating routine EEG in a few months and then consider tapering off her AEDs. - we will signoff. Please feel free to contact with any questions or concerns.   [place] [facility] Pager Number Erick Blinks ______________________________________________________________________   Thank you for the opportunity to take part in the care of this patient. If you have any further questions, please contact the neurology consultation attending.  Signed,  [phone] [facility] Pager Number Erick Blinks

## 2021-10-27 NOTE — NC FL2 (Signed)
[place] MEDICAID FL2 LEVEL OF CARE SCREENING TOOL     IDENTIFICATION  Patient Name: Kristen Ramos Birthdate: 08/15/1960 Sex: female Admission Date (Current Location): 10/20/2021  [facility] and [place] Number:  Producer, television/film/video and Address:  [facility] [place], [address], [place], [place] [address]      Provider Number:    Attending Physician Name and Address:  Rhetta Mura, MD  Relative Name and Phone Number:  Alphonsus Sias (Daughter)   [phone]    Current Level of Care: Hospital Recommended Level of Care: Skilled Nursing Facility Prior Approval Number:    Date Approved/Denied:   PASRR Number: [id number]  Discharge Plan: SNF    Current Diagnoses: Patient Active Problem List   Diagnosis Date Noted   Cyclic vomiting syndrome    AKI (acute kidney injury) (HCC) 03/25/2021   Allergic rhinitis    GERD (gastroesophageal reflux disease)    Tobacco abuse    Intractable cyclical vomiting 06/16/2019   Chronic back pain    Hypokalemia    Impaired fasting blood sugar    Abdominal pain 04/15/2018   Nausea & vomiting 04/15/2018   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   Intractable nausea and vomiting 02/12/2018   Epigastric abdominal pain 02/12/2018    Orientation RESPIRATION BLADDER Height & Weight     Self, Time, Situation, Place  Normal Incontinent, External catheter Weight: 198 lb 10.2 oz (90.1 kg) Height:  5\' 6"  (167.6 cm)  BEHAVIORAL SYMPTOMS/MOOD NEUROLOGICAL BOWEL NUTRITION STATUS      Incontinent Diet (See DC Summary)  AMBULATORY STATUS COMMUNICATION OF NEEDS Skin   Extensive Assist Verbally Normal                       Personal Care Assistance Level of Assistance  Bathing, Dressing, Feeding Bathing Assistance: Maximum assistance Feeding assistance: Independent Dressing Assistance: Maximum assistance     Functional Limitations Info  Sight, Hearing, Speech Sight Info: Adequate Hearing Info: Adequate Speech  Info: Adequate    SPECIAL CARE FACTORS FREQUENCY  PT (By licensed PT), OT (By licensed OT)     PT Frequency: 5x a week OT Frequency: 5x a week            Contractures Contractures Info: Not present    Additional Factors Info  Code Status, Allergies Code Status Info: Full Allergies Info: Bee Venom           Current Medications (10/27/2021):  This is the current hospital active medication list Current Facility-Administered Medications  Medication Dose Route Frequency Provider Last Rate Last Admin   acetaminophen (TYLENOL) tablet 650 mg  650 mg Oral Q6H PRN Opyd, 10/29/2021, MD   650 mg at 10/21/21 0555   Or   acetaminophen (TYLENOL) suppository 650 mg  650 mg Rectal Q6H PRN Opyd, 12/21/21, MD       enoxaparin (LOVENOX) injection 40 mg  40 mg Subcutaneous Daily Opyd, Lavone Neri, MD   40 mg at 10/27/21 0920   levETIRAcetam (KEPPRA) IVPB 500 mg/100 mL premix  500 mg Intravenous Q12H Shafer, 10/29/21, NP 400 mL/hr at 10/27/21 0923 500 mg at 10/27/21 0923   morphine (PF) 2 MG/ML injection 2 mg  2 mg Intravenous Q4H PRN Kc, 10/29/21, MD       ondansetron (ZOFRAN) tablet 4 mg  4 mg Oral Q6H PRN Opyd, Dayna Barker, MD       Or   ondansetron (ZOFRAN) injection 4 mg  4 mg Intravenous Q6H PRN  Briscoe Deutscher, MD   4 mg at 10/22/21 0210   oxyCODONE (Oxy IR/ROXICODONE) immediate release tablet 15 mg  15 mg Oral Q6H PRN Opyd, Lavone Neri, MD   15 mg at 10/26/21 2253   pantoprazole (PROTONIX) EC tablet 40 mg  40 mg Oral Daily Doristine Counter, RPH   40 mg at 10/27/21 0920   sodium chloride flush (NS) 0.9 % injection 3 mL  3 mL Intravenous Q12H Opyd, Lavone Neri, MD   3 mL at 10/27/21 6237     Discharge Medications: Please see discharge summary for a list of discharge medications.  Relevant Imaging Results:  Relevant Lab Results:   Additional Information SSN: [id number]  Ivette Loyal, LCSWA

## 2021-10-27 NOTE — TOC Progression Note (Addendum)
Transition of Care [facility]) - Progression Note    Patient Details  Name: Kristen Ramos MRN: [id number] Date of Birth: 07-29-1960  Transition of Care [facility]) CM/SW Contact  Ivette Loyal, [place] Phone Number: 10/27/2021, 11:44 AM  Clinical Narrative:    CSW provided pt bed offers this morning, she will follow up with CSW on bed offer.   Expected Discharge Plan: Skilled Nursing Facility Barriers to Discharge: Continued Medical Work up  Expected Discharge Plan and Services Expected Discharge Plan: Skilled Nursing Facility         Expected Discharge Date: 10/27/21                                     Social Determinants of Health (SDOH) Interventions    Readmission Risk Interventions     No data to display

## 2021-10-27 NOTE — Progress Notes (Signed)
Family of pt called nursing this am and requested pt receive PT session, last PT eval was 8/16, pt was more lethargic and limited by cognition at that time Pt has shown some improvement and and is requesting PT treatment today. Will follow up this am

## 2021-10-28 NOTE — Progress Notes (Signed)
Seen and examined no changes from the day before  Really wants to go home is trying to ambulate in the room with nursing staff-have mentioned to her she may probably meet her goals quicker with therapy at rehab We will review again in detail in a.m. 8/20 Pleas Koch, MD [facility] 1:54 PM

## 2021-10-29 DIAGNOSIS — R112 Nausea with vomiting, unspecified: Secondary | ICD-10-CM | POA: Diagnosis not present

## 2021-10-29 MED ORDER — LEVETIRACETAM 500 MG PO TABS
500.0000 mg | ORAL_TABLET | Freq: Two times a day (BID) | ORAL | Status: DC
  Administered 2021-10-29: 500 mg via ORAL
  Filled 2021-10-29: qty 1

## 2021-10-29 NOTE — Progress Notes (Signed)
Mobility Specialist Progress Note:   10/29/21 1110  Mobility  Activity Ambulated independently in hallway  Level of Assistance Independent  Assistive Device None  Distance Ambulated (ft) 550 ft  Activity Response Tolerated well  $Mobility charge 1 Mobility   Pt eager for mobility session. Ambulated independently with no AD use. Pt back in room with all needs met.     Acute Rehab Secure Chat or Office Phone: [phone]  

## 2021-10-29 NOTE — Discharge Summary (Signed)
Physician Discharge Summary  Kristen Ramos EQA:[id number] DOB: 1960/06/10 DOA: 10/20/2021  PCP: Jordan Hawks, PA-C  Admit date: 10/20/2021 Discharge date: 10/29/2021  Time spent: 35 minutes  Recommendations for Outpatient Follow-up:  Patient will need methotrexate  reduction dosing in the outpatient setting by rheumatologist-daughter made aware and will need to ensure that this is done Patient is not supposed to drive for the next 6 months given history of seizure this hospital stay will be on Keppra for the next 2 to 3 months and then this can gradually be tapered and weaned with referral to neurology directing this Will need screening labs Chem-12 CBC in about 1 week  Discharge Diagnoses:  MAIN problem for hospitalization   Press syndrome Seizure x1 during hospital stay Starvation ketosis Cyclical vomiting syndrome  Please see below for itemized issues addressed in HOpsital- refer to other progress notes for clarity if needed  Discharge Condition: Improved  Diet recommendation: Regular  Filed Weights   10/26/21 0519 10/27/21 0419 10/29/21 0500  Weight: 90.2 kg 90.1 kg 94.8 kg    History of present illness:  61 year old female Known chronic nausea vomiting (cyclical vomiting) wound care [facility] 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 Chronic tobacco Allergic rhinitis   Admit 8/12 upper abdominal pain nausea vomiting-admits to running out of meclizine recently and not taking PPI Elavil regularly   Found to have potassium 3.4 phosphorus 1.2-was treated with meclizine Zofran Protonix Carafate Ativan Haldol and found to have starvation ketosis consistent with her chronic cyclical vomiting 8/14 p.m.-found to have seizure lasting 2 minutes 50 seconds-EEG ordered--neurology consulted  Hospital Course:  Clinical vomiting syndrome Starvation ketosis Tolerated regular diet since 8/17  without any further nausea vomiting--resolved completely CT abdomen pelvis previously showed no new findings but tiny umbilical hernia Resuming on discharge oxycodone as Oxy IR 15 every 6 as needed severe pain Seizure-like activity on 8/14 EEG long-term  shows mild diffuse encephalopathy with no seizures or epileptiform discharges and EEG.   Neurology recommends Keppra 500 twice daily for several months and then discussion about tapering after repeat studies Possible press syndrome seen on MRI brain 8/17 Neurology felt this could be secondary to methotrexate the patient does not have a history of hypertension No other specific recommendations made Hypokalemia on admission Patient given potassium during hospital stay-potassium has been resolved Rheumatoid arthritis with finger deformities Methotrexate on hold resume as an outpatient Neurology had recommended dosage adjustments in the outpatient setting as this could have been a precipitant to press syndrome   Discharge Exam: Vitals:   10/29/21 0417 10/29/21 0832  BP: (!) 107/58 112/64  Pulse: 82 79  Resp: 16 17  Temp: 98 F (36.7 C) 98.1 F (36.7 C)  SpO2: 95% 94%    Subj on day of d/c   Awake--walking all over the unit--no distress  General Exam on discharge  EOMI NCAT no focal deficit slightly slow responses Chest is clear no rales rhonchi S1-S2 no murmur ROM intact moving 4 limbs but slightly weak  Discharge Instructions   Discharge Instructions     Diet - low sodium heart healthy   Complete by: As directed    Increase activity slowly   Complete by: As directed       Allergies as of 10/29/2021       Reactions   Bee Venom Swelling        Medication List     STOP taking these medications  emtricitabine-tenofovir 200-300 MG tablet Commonly known as: TRUVADA   Horizant 600 MG Tbcr Generic drug: Gabapentin Enacarbil   metoCLOPramide 10 MG tablet Commonly known as: Reglan       TAKE these  medications    folic acid 1 MG tablet Commonly known as: FOLVITE Take 1 mg by mouth daily.   levETIRAcetam 500 MG tablet Commonly known as: Keppra Take 1 tablet (500 mg total) by mouth 2 (two) times daily.   meclizine 25 MG tablet Commonly known as: ANTIVERT Take 1 tablet (25 mg total) by mouth 3 (three) times daily as needed for dizziness. What changed: reasons to take this   methotrexate 2.5 MG tablet Commonly known as: RHEUMATREX Take 15 mg by mouth every Friday.   ondansetron 4 MG disintegrating tablet Commonly known as: ZOFRAN-ODT Take 4 mg by mouth every 8 (eight) hours as needed for nausea/vomiting.   oxyCODONE 15 MG immediate release tablet Commonly known as: ROXICODONE Take 1 tablet (15 mg total) by mouth 4 (four) times daily as needed for pain.   pantoprazole 40 MG tablet Commonly known as: PROTONIX Take 1 tablet (40 mg total) by mouth daily.   polyethylene glycol 17 g packet Commonly known as: MIRALAX / GLYCOLAX Take 17 g by mouth daily. What changed:  when to take this reasons to take this   VITAMIN D-3 PO Take 1 capsule by mouth daily.       Allergies  Allergen Reactions   Bee Venom Swelling    Contact information for after-discharge care     Destination     [facility] .   Service: Skilled Nursing Contact information: [address] [place] [address] [phone]                      The results of significant diagnostics from this hospitalization (including imaging, microbiology, ancillary and laboratory) are listed below for reference.    Significant Diagnostic Studies: MR BRAIN W WO CONTRAST  Result Date: 10/26/2021 CLINICAL DATA:  Seizure, new-onset, no history of trauma EXAM: MRI HEAD WITHOUT AND WITH CONTRAST TECHNIQUE: Multiplanar, multiecho pulse sequences of the brain and surrounding structures were obtained without and with intravenous contrast. CONTRAST:  76mL GADAVIST  GADOBUTROL 1 MMOL/ML IV SOLN COMPARISON:  April 2022 FINDINGS: Brain: There is no reduced diffusion. No evidence of hemorrhage. Patchy subcortical T2 hyperintensity in the occipital lobes and parietal lobes. There also likely some patchy frontal lobe involvement that is new from the prior study. Ventricles and sulci are stable in size and configuration. There is no intracranial mass or mass effect. No hydrocephalus or extra-axial collection. Vascular: Major vessel flow voids at the skull base are preserved. Skull and upper cervical spine: Normal marrow signal is preserved. Sinuses/Orbits: Minor mucosal thickening.  Orbits are unremarkable. Other: Sella is unremarkable. Trace mastoid fluid opacification. Incidental retention or Tornwaldt cyst of the posterior nasopharyngeal wall. IMPRESSION: New patchy subcortical T2 hyperintensity in the parietooccipital lobes and probably frontal lobes. Consider PRES. Electronically Signed   By: Guadlupe Spanish M.D.   On: 10/26/2021 16:39   Overnight EEG with video  Result Date: 10/25/2021 Charlsie Quest, MD     10/26/2021 11:42 AM Patient Name: Patriece Archbold MRN: [id number] Epilepsy Attending: Charlsie Quest Referring Physician/Provider: Charlsie Quest, MD Duration: 10/24/2021 1645 to 10/25/2021 1645  Patient history: 61yo M with seizure like activity. EEG to evaluate for seizure  Level of alertness: awake, asleep  AEDs during EEG  study: None  Technical aspects: This EEG study was done with scalp electrodes positioned according to the 10-20 International system of electrode placement. Electrical activity was reviewed with band pass filter of 1-70Hz , sensitivity of 7 uV/mm, display speed of 82mm/sec with a  notched filter applied as appropriate. EEG data were recorded continuously and digitally stored.  Video monitoring was available and reviewed as appropriate.  Description:  The posterior dominant rhythm consists of 8 Hz activity of moderate voltage (25-35 uV) seen  predominantly in posterior head regions, symmetric and reactive to eye opening and eye closing. Sleep was characterized by vertex waves, sleep spindles (12 to 14 Hz), maximal frontocentral region. EEG showed intermittent generalized rhythmic sharply contoured 2 to 3 Hz delta slowing.  Hyperventilation and photic stimulation were not performed.    ABNORMALITY -Intermittent rhythmic delta slow, generalized  IMPRESSION: This study is suggestive of mild diffuse encephalopathy.  No seizures or epileptiform discharges were seen throughout the recording. EEG appears to be improved compared to previous day.  Charlsie Quest   EEG adult  Result Date: 10/24/2021 Charlsie Quest, MD     10/24/2021  3:08 PM Patient Name: Mackenna Kamer MRN: [id number] Epilepsy Attending: Charlsie Quest Referring Physician/Provider: Joseph Art, DO Date: 10/24/2021 Duration: 24.38 mins Patient history: 61yo M with seizure like activity. EEG to evaluate for seizure Level of alertness: lethargic AEDs during EEG study: None Technical aspects: This EEG study was done with scalp electrodes positioned according to the 10-20 International system of electrode placement. Electrical activity was reviewed with band pass filter of 1-70Hz , sensitivity of 7 uV/mm, display speed of 28mm/sec with a  notched filter applied as appropriate. EEG data were recorded continuously and digitally stored.  Video monitoring was available and reviewed as appropriate. Description: No clear posterior dominant rhythm was seen. EEG showed continuous generalized 5-7hz  theta slowing admixed with intermittent generalized 2-3Hz  delta slowing. Generalized periodic discharges with triphasic morphology at 2.5-3 Hz were also noted intermittently. Hyperventilation and photic stimulation were not performed.    ABNORMALITY - Periodic discharges with triphasic morphology, generalized ( GPDs) - Continuous slow, generalized  IMPRESSION: This study generalized periodic discharges  with triphasic morphology which can be on the ictal-interictal continuum. Recommend long term eeg monitoring for further evaluation.  Additionally there is evidence of moderate diffuse encephalopathy.  No seizures were seen throughout the recording. Charlsie Quest   CT ABDOMEN PELVIS W CONTRAST  Result Date: 10/23/2021 CLINICAL DATA:  Intractable nausea and vomiting with chronic cyclical vomiting syndrome, dehydration, ketosis EXAM: CT ABDOMEN AND PELVIS WITH CONTRAST TECHNIQUE: Multidetector CT imaging of the abdomen and pelvis was performed using the standard protocol following bolus administration of intravenous contrast. RADIATION DOSE REDUCTION: This exam was performed according to the departmental dose-optimization program which includes automated exposure control, adjustment of the mA and/or kV according to patient size and/or use of iterative reconstruction technique. CONTRAST:  OMNIPAQUE IOHEXOL 300 MG/ML SOLN IV. No oral contrast. COMPARISON:  06/19/2019 FINDINGS: Lower chest: Lung bases clear Hepatobiliary: Gallbladder and liver normal appearance. Slightly prominent CBD 11 mm diameter, unchanged. Pancreas: Normal appearance Spleen: Normal appearance Adrenals/Urinary Tract: LEFT adrenal thickening without discrete mass. Unremarkable RIGHT adrenal gland and kidneys. No renal mass, hydronephrosis, ureteral dilatation, or urinary tract calcification. Bladder unremarkable. Stomach/Bowel: Normal appendix. Stomach and bowel loops normal appearance Vascular/Lymphatic: Atherosclerotic calcifications aorta and iliac arteries without aneurysm. No adenopathy. Reproductive: Uterus surgically absent. Small amount of fluid in vagina. Ovaries unremarkable. Other: No free air or  free fluid. Tiny umbilical hernia containing fat. Foci of subcutaneous gas anterior abdominal wall question medication injection site Musculoskeletal: Prior lumbar fusion.  No acute osseous findings. IMPRESSION: No acute  intra-abdominal or intrapelvic abnormalities. Tiny umbilical hernia containing fat. Slightly prominent CBD 11 mm diameter, unchanged, correlate with LFTs. Aortic Atherosclerosis (ICD10-I70.0). Electronically Signed   By: Ulyses Southward M.D.   On: 10/23/2021 15:13   DG Abd 1 View  Result Date: 10/22/2021 CLINICAL DATA:  Nausea and vomiting. EXAM: ABDOMEN - 1 VIEW COMPARISON:  None Available. FINDINGS: No free air, portal venous gas, or pneumatosis. The bowel gas pattern is unremarkable no evidence of obstruction. IMPRESSION: No cause for symptoms identified.  No evidence of obstruction. Electronically Signed   By: Gerome Sam III M.D.   On: 10/22/2021 08:30   CT HEAD WO CONTRAST ( )  Result Date: 10/21/2021 CLINICAL DATA:  Altered mental status. EXAM: CT HEAD WITHOUT CONTRAST TECHNIQUE: Contiguous axial images were obtained from the base of the skull through the vertex without intravenous contrast. RADIATION DOSE REDUCTION: This exam was performed according to the departmental dose-optimization program which includes automated exposure control, adjustment of the mA and/or kV according to patient size and/or use of iterative reconstruction technique. COMPARISON:  None Available. FINDINGS: Brain: No evidence of acute infarction, hemorrhage, hydrocephalus, extra-axial collection or mass lesion/mass effect. Vascular: No hyperdense vessel or unexpected calcification. Skull: Normal. Negative for fracture or focal lesion. Sinuses/Orbits: No acute finding. Other: It should be noted that the study is limited secondary to marked severity patient motion and subsequent artifact. IMPRESSION: 1. Limited study secondary to marked severity patient motion and subsequent artifact. 2. No acute intracranial abnormality. Electronically Signed   By: Aram Candela M.D.   On: 10/21/2021 03:10    Microbiology: No results found for this or any previous visit (from the past 240 hour(s)).   Labs: Basic Metabolic  Panel: Recent Labs  Lab 10/23/21 0752 10/24/21 0208 10/25/21 0208  NA 135 135 136  K 3.2* 3.1* 3.6  CL 101 105 106  CO2 21* 21* 20*  GLUCOSE 97 93 80  BUN 9 9 7   CREATININE 0.67 0.74 0.69  CALCIUM 8.9 8.7* 8.4*  MG  --  1.9  --     Liver Function Tests: No results for input(s): "AST", "ALT", "ALKPHOS", "BILITOT", "PROT", "ALBUMIN" in the last 168 hours.  No results for input(s): "LIPASE", "AMYLASE" in the last 168 hours. No results for input(s): "AMMONIA" in the last 168 hours. CBC: Recent Labs  Lab 10/24/21 0208  WBC 8.8  HGB 14.5  HCT 41.4  MCV 90.6  PLT 297    Cardiac Enzymes: No results for input(s): "CKTOTAL", "CKMB", "CKMBINDEX", "TROPONINI" in the last 168 hours. BNP: BNP (last 3 results) No results for input(s): "BNP" in the last 8760 hours.  ProBNP (last 3 results) No results for input(s): "PROBNP" in the last 8760 hours.  CBG: Recent Labs  Lab 10/23/21 2140  GLUCAP 101*        Signed:  Rhetta Mura MD   [facility] 10/29/2021, 10:46 AM

## 2021-10-30 ENCOUNTER — Other Ambulatory Visit: Payer: Self-pay | Admitting: Student

## 2021-10-30 DIAGNOSIS — R569 Unspecified convulsions: Secondary | ICD-10-CM

## 2021-10-30 NOTE — Progress Notes (Signed)
Ordered 30 day event monitor for further evaluation of seizure like activity at the request of Neurology. Patient has never been seen by our office so will also arrange a New Patient visit with Dr. Antoine Poche in 6-8 weeks to review monitor results.  Corrin Parker, PA-C 10/30/2021 5:22 PM

## 2021-10-31 ENCOUNTER — Other Ambulatory Visit: Payer: Self-pay | Admitting: Student

## 2021-10-31 DIAGNOSIS — R42 Dizziness and giddiness: Secondary | ICD-10-CM

## 2021-10-31 DIAGNOSIS — R569 Unspecified convulsions: Secondary | ICD-10-CM

## 2021-11-17 ENCOUNTER — Emergency Department (HOSPITAL_COMMUNITY): Payer: Medicare (Managed Care)

## 2021-11-17 ENCOUNTER — Emergency Department (HOSPITAL_COMMUNITY)
Admission: EM | Admit: 2021-11-17 | Discharge: 2021-11-18 | Disposition: A | Discharge status: Home / Self Care | Payer: Medicare (Managed Care) | Source: Home / Self Care | Attending: Emergency Medicine | Admitting: Emergency Medicine

## 2021-11-17 ENCOUNTER — Encounter (HOSPITAL_COMMUNITY): Payer: Self-pay | Admitting: Emergency Medicine

## 2021-11-17 DIAGNOSIS — Z20822 Contact with and (suspected) exposure to covid-19: Secondary | ICD-10-CM | POA: Insufficient documentation

## 2021-11-17 DIAGNOSIS — R0981 Nasal congestion: Secondary | ICD-10-CM | POA: Insufficient documentation

## 2021-11-17 DIAGNOSIS — M549 Dorsalgia, unspecified: Secondary | ICD-10-CM | POA: Insufficient documentation

## 2021-11-17 DIAGNOSIS — R1115 Cyclical vomiting syndrome unrelated to migraine: Secondary | ICD-10-CM | POA: Diagnosis not present

## 2021-11-17 DIAGNOSIS — R0600 Dyspnea, unspecified: Secondary | ICD-10-CM | POA: Insufficient documentation

## 2021-11-17 DIAGNOSIS — G9340 Encephalopathy, unspecified: Secondary | ICD-10-CM | POA: Diagnosis not present

## 2021-11-17 DIAGNOSIS — R051 Acute cough: Secondary | ICD-10-CM | POA: Insufficient documentation

## 2021-11-17 DIAGNOSIS — R079 Chest pain, unspecified: Secondary | ICD-10-CM | POA: Insufficient documentation

## 2021-11-17 DIAGNOSIS — J449 Chronic obstructive pulmonary disease, unspecified: Secondary | ICD-10-CM | POA: Insufficient documentation

## 2021-11-17 NOTE — ED Triage Notes (Addendum)
Per EMS, patient from home, c/o persistent cough with green sputum x4 days. C/o headache and pain to right flank with coughing.   BP 130/90 RR 18 99% RA CBG 89

## 2021-11-18 ENCOUNTER — Emergency Department (HOSPITAL_COMMUNITY): Payer: Medicare (Managed Care)

## 2021-11-18 LAB — RESP PANEL BY RT-PCR (FLU A&B, COVID) ARPGX2
Influenza A by PCR: NEGATIVE
Influenza B by PCR: NEGATIVE
SARS Coronavirus 2 by RT PCR: NEGATIVE

## 2021-11-18 MED ORDER — BENZONATATE 100 MG PO CAPS: 100.0000 mg | ORAL_CAPSULE | Freq: Three times a day (TID) | ORAL | 0 refills | Status: DC | PRN

## 2021-11-18 MED ORDER — LIDOCAINE 5 % EX PTCH: 1.0000 | MEDICATED_PATCH | Freq: Every day | CUTANEOUS | 0 refills | Status: DC | PRN

## 2021-11-18 MED ORDER — LIDOCAINE 5 % EX PTCH
1.0000 | MEDICATED_PATCH | CUTANEOUS | Status: DC
  Administered 2021-11-18: 1 via TRANSDERMAL
  Filled 2021-11-18: qty 1

## 2021-11-18 MED ORDER — ALBUTEROL SULFATE HFA 108 (90 BASE) MCG/ACT IN AERS
1.0000 | INHALATION_SPRAY | Freq: Once | RESPIRATORY_TRACT | Status: AC
  Administered 2021-11-18: 1 via RESPIRATORY_TRACT
  Filled 2021-11-18: qty 6.7

## 2021-11-18 MED ORDER — IPRATROPIUM-ALBUTEROL 0.5-2.5 (3) MG/3ML IN SOLN
3.0000 mL | Freq: Once | RESPIRATORY_TRACT | Status: AC
  Administered 2021-11-18: 3 mL via RESPIRATORY_TRACT
  Filled 2021-11-18: qty 3

## 2021-11-18 MED ORDER — ACETAMINOPHEN 325 MG PO TABS
650.0000 mg | ORAL_TABLET | Freq: Once | ORAL | Status: AC
  Administered 2021-11-18: 650 mg via ORAL
  Filled 2021-11-18: qty 2

## 2021-11-18 NOTE — Discharge Instructions (Addendum)
You were seen in the emergency department tonight for cough.  Your chest x-ray was normal.  Your covid/flu test returned and are negative. We are sending you home with the following medicines to help with your symptoms: - Albuterol inhaler: Use 1 to 2 puffs every 4-6 hours as needed for wheezing/shortness of breath - Lidoderm patch: Apply 1 patch to area with significant pain once per day to help numb/soothe the area, remove and discard within 12 hours.  Do not apply heat over the patch. -Tessalon: Take every 8 hours as needed for coughing.  We have prescribed you new medication(s) today. Discuss the medications prescribed today with your pharmacist as they can have adverse effects and interactions with your other medicines including over the counter and prescribed medications. Seek medical evaluation if you start to experience new or abnormal symptoms after taking one of these medicines, seek care immediately if you start to experience difficulty breathing, feeling of your throat closing, facial swelling, or rash as these could be indications of a more serious allergic reaction  Please follow-up with your primary care provider early next week for recheck of your symptoms.  Return to the emergency department for any new or worsening symptoms including but not limited to new or worsening pain, fever, passing out, increased work of breathing, coughing up blood, inability to keep fluids down, or any other concerns.

## 2021-11-18 NOTE — ED Provider Notes (Signed)
[facility]-EMERGENCY DEPT Provider Note   CSN: [id number] Arrival date & time: 11/17/21  2115     History  Chief Complaint  Patient presents with   Cough    Kristen Ramos is a 61 y.o. female with a hx of RA on methotrexate, cyclic vomiting syndrome, GERD, and reported COPD who presents to the ED with complaints of cough for the past 4-5 days. Patient reports cough productive of green mucous sputum, now more clear, with associated nasal congestion, dyspnea, wheezing, and chest pain/back pain with coughing spells. No other alleviating/aggravating factors. Denies fever, hemoptysis, leg swelling, abdominal pain, or syncope.   HPI     Home Medications Prior to Admission medications   Medication Sig Start Date End Date Taking? Authorizing Provider  Cholecalciferol (VITAMIN D-3 PO) Take 1 capsule by mouth daily.    [provider]  folic acid (FOLVITE) 1 MG tablet Take 1 mg by mouth daily. 04/01/19   [provider]  levETIRAcetam (KEPPRA) 500 MG tablet Take 1 tablet (500 mg total) by mouth 2 (two) times daily. 10/27/21   Rhetta Mura, MD  meclizine (ANTIVERT) 25 MG tablet Take 1 tablet (25 mg total) by mouth 3 (three) times daily as needed for dizziness. 10/20/21   Derwood Kaplan, MD  methotrexate (RHEUMATREX) 2.5 MG tablet Take 15 mg by mouth every Friday. 03/31/19   [provider]  ondansetron (ZOFRAN-ODT) 4 MG disintegrating tablet Take 4 mg by mouth every 8 (eight) hours as needed for nausea/vomiting. 10/18/21   [provider]  oxyCODONE (ROXICODONE) 15 MG immediate release tablet Take 1 tablet (15 mg total) by mouth 4 (four) times daily as needed for pain. 10/27/21   Rhetta Mura, MD  pantoprazole (PROTONIX) 40 MG tablet Take 1 tablet (40 mg total) by mouth daily. 06/22/19   Sheikh, Kateri Mc Latif, DO  polyethylene glycol (MIRALAX / GLYCOLAX) 17 g packet Take 17 g by mouth daily. Patient taking differently: Take 17 g by  mouth daily as needed for mild constipation. 06/23/19   Marguerita Merles Latif, DO      Allergies    Bee venom    Review of Systems   Review of Systems  Constitutional:  Negative for chills and fever.  HENT:  Positive for congestion. Negative for ear pain.   Respiratory:  Positive for cough, shortness of breath and wheezing.   Cardiovascular:  Positive for chest pain (w/ coughing). Negative for leg swelling.  Gastrointestinal:  Negative for abdominal pain.  Musculoskeletal:  Positive for back pain (w/ coughing).  Neurological:  Negative for syncope.  All other systems reviewed and are negative.   Physical Exam Updated Vital Signs BP (!) 138/92   Pulse 78   Temp 98.2 F (36.8 C) (Oral)   Resp 20   SpO2 98%  Physical Exam Vitals and nursing note reviewed.  Constitutional:      General: She is not in acute distress.    Appearance: She is well-developed.  HENT:     Head: Normocephalic and atraumatic.     Right Ear: Ear canal normal. Tympanic membrane is not perforated, erythematous, retracted or bulging.     Left Ear: Ear canal normal. Tympanic membrane is not perforated, erythematous, retracted or bulging.     Ears:     Comments: No mastoid erythema/swelling/tenderness.     Nose: Congestion present.     Right Sinus: No maxillary sinus tenderness or frontal sinus tenderness.     Left Sinus: No maxillary sinus tenderness or  frontal sinus tenderness.     Mouth/Throat:     Pharynx: Uvula midline. No oropharyngeal exudate or posterior oropharyngeal erythema.     Comments: Posterior oropharynx is symmetric appearing. Patient tolerating own secretions without difficulty. No trismus. No drooling. No hot potato voice. No swelling beneath the tongue, submandibular compartment is soft.  Eyes:     General:        Right eye: No discharge.        Left eye: No discharge.     Conjunctiva/sclera: Conjunctivae normal.     Pupils: Pupils are equal, round, and reactive to light.   Cardiovascular:     Rate and Rhythm: Normal rate and regular rhythm.     Heart sounds: No murmur heard. Pulmonary:     Effort: No respiratory distress.     Breath sounds: Transmitted upper airway sounds present. Wheezing (minimal end expiratory) present. No rhonchi or rales.  Chest:     Chest wall: Tenderness present.  Abdominal:     General: There is no distension.     Palpations: Abdomen is soft.     Tenderness: There is no abdominal tenderness.  Musculoskeletal:     Cervical back: Normal range of motion and neck supple. No edema or rigidity.  Lymphadenopathy:     Cervical: No cervical adenopathy.  Skin:    General: Skin is warm and dry.     Findings: No rash.  Neurological:     Mental Status: She is alert.  Psychiatric:        Behavior: Behavior normal.    ED Results / Procedures / Treatments   Labs (all labs ordered are listed, but only abnormal results are displayed) Labs Reviewed  RESP PANEL BY RT-PCR (FLU A&B, COVID) [id number]    EKG None  Radiology DG Chest 2 View  Result Date: 11/18/2021 CLINICAL DATA:  Persistent cough EXAM: CHEST - 2 VIEW COMPARISON:  06/16/2019 FINDINGS: Cardiac and mediastinal contours are within normal limits. No focal pulmonary opacity. No pleural effusion or pneumothorax. No acute osseous abnormality. Status post ACDF IMPRESSION: No acute cardiopulmonary process. Electronically Signed   By: Wiliam Ke M.D.   On: 11/18/2021 01:09    Procedures Procedures    Medications Ordered in ED Medications  ipratropium-albuterol (DUONEB) 0.5-2.5 (3) MG/3ML nebulizer solution 3 mL (has no administration in time range)    ED Course/ Medical Decision Making/ A&P                           Medical Decision Making Amount and/or Complexity of Data Reviewed Radiology: ordered.  Risk OTC drugs. Prescription drug management.  Patient presents to the ED with complaints of cough.  Nontoxic, vitals w/ mildly elevated BP on arrival- low suspicion  for HTN emergency.   Chart/nursing notes viewed for additional hx Viewed external records including most recent CXR on record.   I ordered, viewed & interpreted imaging including CXR- agree with radiologist- No acute cardiopulmonary process.   I ordered & viewed labs- covid/flu testing negative  Patient given a trial of a DuoNeb given she does have some mild wheezing/upper airway sounds being transmitted, also given Lidoderm patch and Tylenol to help with discomfort as her chest pain is reproducible with chest wall palpation and is specific to coughing.  On reassessment patient is feeling improved.  Lung sounds are now clear.  SPO2 100% on room air without increased work of breathing.  Afebrile without sinus tenderness to suggest acute bacterial  sinusitis.  Low Centor score, low suspicion for strep.  No findings of AOM/AOE/mastoiditis.  X-ray without infiltrate to suggest pneumonia.  Chest x-ray is also without fluid overload or pneumothorax.  Possibly viral versus allergic, given symptomatic improvement and reassuring repeat exam the patient is reasonable for discharge home with supportive care.  I discussed results, treatment plan, need for follow-up, and return precautions with the patient and her daughter at bedside.  Provided opportunity for questions, they have confirmed understanding and are in agreement.        Final Clinical Impression(s) / ED Diagnoses Final diagnoses:  Acute cough    Rx / DC Orders ED Discharge Orders          Ordered    benzonatate (TESSALON) 100 MG capsule  3 times daily PRN        11/18/21 0156    lidocaine (LIDODERM) 5 %  Daily PRN        11/18/21 0156              Goodwin Kamphaus, Glynda Jaeger, PA-C 11/18/21 [id number]    Ripley Fraise, MD 11/18/21 [phone]

## 2021-11-19 ENCOUNTER — Inpatient Hospital Stay (HOSPITAL_BASED_OUTPATIENT_CLINIC_OR_DEPARTMENT_OTHER)
Admission: EM | Admit: 2021-11-19 | Discharge: 2021-11-27 | DRG: 393 | Disposition: A | Discharge status: Home / Self Care | Payer: Medicare (Managed Care) | Attending: Family Medicine | Admitting: Family Medicine

## 2021-11-19 ENCOUNTER — Other Ambulatory Visit: Payer: Self-pay

## 2021-11-19 ENCOUNTER — Encounter (HOSPITAL_BASED_OUTPATIENT_CLINIC_OR_DEPARTMENT_OTHER): Payer: Self-pay

## 2021-11-19 DIAGNOSIS — I6783 Posterior reversible encephalopathy syndrome: Secondary | ICD-10-CM

## 2021-11-19 DIAGNOSIS — G9340 Encephalopathy, unspecified: Principal | ICD-10-CM

## 2021-11-19 DIAGNOSIS — M0520 Rheumatoid vasculitis with rheumatoid arthritis of unspecified site: Secondary | ICD-10-CM | POA: Diagnosis present

## 2021-11-19 DIAGNOSIS — K219 Gastro-esophageal reflux disease without esophagitis: Secondary | ICD-10-CM | POA: Diagnosis present

## 2021-11-19 DIAGNOSIS — R569 Unspecified convulsions: Secondary | ICD-10-CM | POA: Diagnosis present

## 2021-11-19 DIAGNOSIS — R7301 Impaired fasting glucose: Secondary | ICD-10-CM | POA: Diagnosis present

## 2021-11-19 DIAGNOSIS — G9341 Metabolic encephalopathy: Secondary | ICD-10-CM | POA: Diagnosis present

## 2021-11-19 DIAGNOSIS — E7220 Disorder of urea cycle metabolism, unspecified: Secondary | ICD-10-CM | POA: Diagnosis present

## 2021-11-19 DIAGNOSIS — M069 Rheumatoid arthritis, unspecified: Secondary | ICD-10-CM | POA: Diagnosis present

## 2021-11-19 DIAGNOSIS — R4182 Altered mental status, unspecified: Secondary | ICD-10-CM | POA: Diagnosis present

## 2021-11-19 DIAGNOSIS — E876 Hypokalemia: Secondary | ICD-10-CM | POA: Diagnosis present

## 2021-11-19 DIAGNOSIS — Z79631 Long term (current) use of antimetabolite agent: Secondary | ICD-10-CM

## 2021-11-19 DIAGNOSIS — E669 Obesity, unspecified: Secondary | ICD-10-CM

## 2021-11-19 DIAGNOSIS — R471 Dysarthria and anarthria: Secondary | ICD-10-CM | POA: Diagnosis not present

## 2021-11-19 DIAGNOSIS — R112 Nausea with vomiting, unspecified: Secondary | ICD-10-CM | POA: Diagnosis present

## 2021-11-19 DIAGNOSIS — R1115 Cyclical vomiting syndrome unrelated to migraine: Principal | ICD-10-CM | POA: Diagnosis present

## 2021-11-19 DIAGNOSIS — Z90710 Acquired absence of both cervix and uterus: Secondary | ICD-10-CM

## 2021-11-19 DIAGNOSIS — Z20822 Contact with and (suspected) exposure to covid-19: Secondary | ICD-10-CM | POA: Diagnosis present

## 2021-11-19 DIAGNOSIS — F17210 Nicotine dependence, cigarettes, uncomplicated: Secondary | ICD-10-CM | POA: Diagnosis present

## 2021-11-19 DIAGNOSIS — M549 Dorsalgia, unspecified: Secondary | ICD-10-CM | POA: Diagnosis present

## 2021-11-19 DIAGNOSIS — Z91030 Bee allergy status: Secondary | ICD-10-CM

## 2021-11-19 DIAGNOSIS — E873 Alkalosis: Secondary | ICD-10-CM | POA: Diagnosis present

## 2021-11-19 DIAGNOSIS — I10 Essential (primary) hypertension: Secondary | ICD-10-CM

## 2021-11-19 DIAGNOSIS — Z6833 Body mass index (BMI) 33.0-33.9, adult: Secondary | ICD-10-CM

## 2021-11-19 DIAGNOSIS — Z720 Tobacco use: Secondary | ICD-10-CM | POA: Diagnosis present

## 2021-11-19 DIAGNOSIS — J449 Chronic obstructive pulmonary disease, unspecified: Secondary | ICD-10-CM | POA: Diagnosis present

## 2021-11-19 DIAGNOSIS — Z79899 Other long term (current) drug therapy: Secondary | ICD-10-CM

## 2021-11-19 DIAGNOSIS — G40909 Epilepsy, unspecified, not intractable, without status epilepticus: Secondary | ICD-10-CM

## 2021-11-19 DIAGNOSIS — G8929 Other chronic pain: Secondary | ICD-10-CM | POA: Diagnosis present

## 2021-11-19 LAB — COMPREHENSIVE METABOLIC PANEL
ALT: 12 U/L (ref 0–44)
AST: 15 U/L (ref 15–41)
Albumin: 4.5 g/dL (ref 3.5–5.0)
Alkaline Phosphatase: 108 U/L (ref 38–126)
Anion gap: 16 — ABNORMAL HIGH (ref 5–15)
BUN: 9 mg/dL (ref 6–20)
CO2: 20 mmol/L — ABNORMAL LOW (ref 22–32)
Calcium: 10.4 mg/dL — ABNORMAL HIGH (ref 8.9–10.3)
Chloride: 102 mmol/L (ref 98–111)
Creatinine, Ser: 0.75 mg/dL (ref 0.44–1.00)
GFR, Estimated: >60 mL/min (ref >60)
Glucose, Bld: 165 mg/dL — ABNORMAL HIGH (ref 70–99)
Potassium: 3.7 mmol/L (ref 3.5–5.1)
Sodium: 138 mmol/L (ref 135–145)
Total Bilirubin: 0.6 mg/dL (ref 0.3–1.2)
Total Protein: 9.1 g/dL — ABNORMAL HIGH (ref 6.5–8.1)

## 2021-11-19 LAB — CBC
HCT: 39.6 % (ref 36.0–46.0)
Hemoglobin: 13.7 g/dL (ref 12.0–15.0)
MCH: 31.6 pg (ref 26.0–34.0)
MCHC: 34.6 g/dL (ref 30.0–36.0)
MCV: 91.2 fL (ref 80.0–100.0)
Platelets: 420 10*3/uL — ABNORMAL HIGH (ref 150–400)
RBC: 4.34 MIL/uL (ref 3.87–5.11)
RDW: 13.5 % (ref 11.5–15.5)
WBC: 7.5 10*3/uL (ref 4.0–10.5)
nRBC: 0 % (ref 0.0–0.2)

## 2021-11-19 LAB — CBG MONITORING, ED: Glucose-Capillary: 161 mg/dL — ABNORMAL HIGH (ref 70–99)

## 2021-11-19 LAB — LIPASE, BLOOD: Lipase: 10 U/L — ABNORMAL LOW (ref 11–51)

## 2021-11-19 MED ORDER — ONDANSETRON HCL 4 MG/2ML IJ SOLN
4.0000 mg | Freq: Once | INTRAMUSCULAR | Status: DC
  Filled 2021-11-19: qty 2

## 2021-11-19 MED ORDER — SODIUM CHLORIDE 0.9 % IV BOLUS
1000.0000 mL | Freq: Once | INTRAVENOUS | Status: AC
  Administered 2021-11-19: 1000 mL via INTRAVENOUS

## 2021-11-19 MED ORDER — METOCLOPRAMIDE HCL 5 MG/ML IJ SOLN
5.0000 mg | Freq: Once | INTRAMUSCULAR | Status: AC
  Administered 2021-11-19: 5 mg via INTRAVENOUS
  Filled 2021-11-19: qty 2

## 2021-11-19 NOTE — ED Triage Notes (Signed)
Patient BIB [facility] from Home.  Endorses N/V for Unknown Time associated with ABD Pain.   Given 4 of IV Zofran through 20G on Left Forearm established by Same.   VSS with EMS En Route. A&Ox4. GCS 15. BIB Wheelchair/Stretcher. Agitated in Triage.

## 2021-11-19 NOTE — ED Notes (Signed)
Pt uncooperative for repeat vital signs at this time

## 2021-11-20 ENCOUNTER — Emergency Department (HOSPITAL_BASED_OUTPATIENT_CLINIC_OR_DEPARTMENT_OTHER): Payer: Medicare (Managed Care)

## 2021-11-20 ENCOUNTER — Encounter (HOSPITAL_COMMUNITY): Payer: Self-pay

## 2021-11-20 DIAGNOSIS — M069 Rheumatoid arthritis, unspecified: Secondary | ICD-10-CM | POA: Diagnosis present

## 2021-11-20 DIAGNOSIS — E7220 Disorder of urea cycle metabolism, unspecified: Secondary | ICD-10-CM | POA: Diagnosis present

## 2021-11-20 DIAGNOSIS — R7301 Impaired fasting glucose: Secondary | ICD-10-CM

## 2021-11-20 DIAGNOSIS — E876 Hypokalemia: Secondary | ICD-10-CM

## 2021-11-20 DIAGNOSIS — G9341 Metabolic encephalopathy: Secondary | ICD-10-CM | POA: Diagnosis present

## 2021-11-20 DIAGNOSIS — R112 Nausea with vomiting, unspecified: Secondary | ICD-10-CM

## 2021-11-20 DIAGNOSIS — R4182 Altered mental status, unspecified: Secondary | ICD-10-CM

## 2021-11-20 DIAGNOSIS — M549 Dorsalgia, unspecified: Secondary | ICD-10-CM | POA: Diagnosis present

## 2021-11-20 DIAGNOSIS — R471 Dysarthria and anarthria: Secondary | ICD-10-CM | POA: Diagnosis not present

## 2021-11-20 DIAGNOSIS — K219 Gastro-esophageal reflux disease without esophagitis: Secondary | ICD-10-CM

## 2021-11-20 DIAGNOSIS — E669 Obesity, unspecified: Secondary | ICD-10-CM | POA: Diagnosis present

## 2021-11-20 DIAGNOSIS — M0520 Rheumatoid vasculitis with rheumatoid arthritis of unspecified site: Secondary | ICD-10-CM

## 2021-11-20 DIAGNOSIS — Z6833 Body mass index (BMI) 33.0-33.9, adult: Secondary | ICD-10-CM | POA: Diagnosis not present

## 2021-11-20 DIAGNOSIS — Z91030 Bee allergy status: Secondary | ICD-10-CM | POA: Diagnosis not present

## 2021-11-20 DIAGNOSIS — F17210 Nicotine dependence, cigarettes, uncomplicated: Secondary | ICD-10-CM | POA: Diagnosis present

## 2021-11-20 DIAGNOSIS — R1115 Cyclical vomiting syndrome unrelated to migraine: Secondary | ICD-10-CM

## 2021-11-20 DIAGNOSIS — Z720 Tobacco use: Secondary | ICD-10-CM

## 2021-11-20 DIAGNOSIS — G9340 Encephalopathy, unspecified: Secondary | ICD-10-CM | POA: Diagnosis present

## 2021-11-20 DIAGNOSIS — Z79631 Long term (current) use of antimetabolite agent: Secondary | ICD-10-CM | POA: Diagnosis not present

## 2021-11-20 DIAGNOSIS — R569 Unspecified convulsions: Secondary | ICD-10-CM | POA: Diagnosis present

## 2021-11-20 DIAGNOSIS — Z20822 Contact with and (suspected) exposure to covid-19: Secondary | ICD-10-CM | POA: Diagnosis present

## 2021-11-20 DIAGNOSIS — I10 Essential (primary) hypertension: Secondary | ICD-10-CM | POA: Diagnosis present

## 2021-11-20 DIAGNOSIS — E873 Alkalosis: Secondary | ICD-10-CM | POA: Diagnosis present

## 2021-11-20 DIAGNOSIS — G8929 Other chronic pain: Secondary | ICD-10-CM | POA: Diagnosis present

## 2021-11-20 DIAGNOSIS — Z90710 Acquired absence of both cervix and uterus: Secondary | ICD-10-CM | POA: Diagnosis not present

## 2021-11-20 DIAGNOSIS — I6783 Posterior reversible encephalopathy syndrome: Secondary | ICD-10-CM | POA: Diagnosis present

## 2021-11-20 DIAGNOSIS — Z79899 Other long term (current) drug therapy: Secondary | ICD-10-CM | POA: Diagnosis not present

## 2021-11-20 DIAGNOSIS — J449 Chronic obstructive pulmonary disease, unspecified: Secondary | ICD-10-CM | POA: Diagnosis present

## 2021-11-20 LAB — URINALYSIS, ROUTINE W REFLEX MICROSCOPIC
Bilirubin Urine: NEGATIVE
Glucose, UA: NEGATIVE mg/dL
Hgb urine dipstick: NEGATIVE
Ketones, ur: 15 mg/dL — AB
Leukocytes,Ua: NEGATIVE
Nitrite: NEGATIVE
Specific Gravity, Urine: 1.02 (ref 1.005–1.030)
pH: 8 (ref 5.0–8.0)

## 2021-11-20 LAB — CBG MONITORING, ED: Glucose-Capillary: 119 mg/dL — ABNORMAL HIGH (ref 70–99)

## 2021-11-20 LAB — I-STAT ARTERIAL BLOOD GAS, ED
Acid-Base Excess: 0 mmol/L (ref 0.0–2.0)
Bicarbonate: 22.2 mmol/L (ref 20.0–28.0)
Calcium, Ion: 1.19 mmol/L (ref 1.15–1.40)
HCT: 44 % (ref 36.0–46.0)
Hemoglobin: 15 g/dL (ref 12.0–15.0)
O2 Saturation: 97 %
Patient temperature: 98.6
Potassium: 3.5 mmol/L (ref 3.5–5.1)
Sodium: 141 mmol/L (ref 135–145)
TCO2: 23 mmol/L (ref 22–32)
pCO2 arterial: 30.7 mmHg — ABNORMAL LOW (ref 32–48)
pH, Arterial: 7.469 — ABNORMAL HIGH (ref 7.35–7.45)
pO2, Arterial: 88 mmHg (ref 83–108)

## 2021-11-20 LAB — COMPREHENSIVE METABOLIC PANEL
ALT: 17 U/L (ref 0–44)
AST: 19 U/L (ref 15–41)
Albumin: 4 g/dL (ref 3.5–5.0)
Alkaline Phosphatase: 98 U/L (ref 38–126)
Anion gap: 13 (ref 5–15)
BUN: 10 mg/dL (ref 6–20)
CO2: 21 mmol/L — ABNORMAL LOW (ref 22–32)
Calcium: 9.7 mg/dL (ref 8.9–10.3)
Chloride: 107 mmol/L (ref 98–111)
Creatinine, Ser: 0.67 mg/dL (ref 0.44–1.00)
GFR, Estimated: >60 mL/min (ref >60)
Glucose, Bld: 130 mg/dL — ABNORMAL HIGH (ref 70–99)
Potassium: 3.3 mmol/L — ABNORMAL LOW (ref 3.5–5.1)
Sodium: 141 mmol/L (ref 135–145)
Total Bilirubin: 0.9 mg/dL (ref 0.3–1.2)
Total Protein: 9.4 g/dL — ABNORMAL HIGH (ref 6.5–8.1)

## 2021-11-20 LAB — PHOSPHORUS: Phosphorus: 3.7 mg/dL (ref 2.5–4.6)

## 2021-11-20 LAB — CBC
HCT: 46.4 % — ABNORMAL HIGH (ref 36.0–46.0)
Hemoglobin: 15.6 g/dL — ABNORMAL HIGH (ref 12.0–15.0)
MCH: 31.2 pg (ref 26.0–34.0)
MCHC: 33.6 g/dL (ref 30.0–36.0)
MCV: 92.8 fL (ref 80.0–100.0)
Platelets: 478 10*3/uL — ABNORMAL HIGH (ref 150–400)
RBC: 5 MIL/uL (ref 3.87–5.11)
RDW: 13.8 % (ref 11.5–15.5)
WBC: 9.4 10*3/uL (ref 4.0–10.5)
nRBC: 0 % (ref 0.0–0.2)

## 2021-11-20 LAB — RAPID URINE DRUG SCREEN, HOSP PERFORMED
Amphetamines: NOT DETECTED
Barbiturates: NOT DETECTED
Benzodiazepines: NOT DETECTED
Cocaine: NOT DETECTED
Opiates: NOT DETECTED
Tetrahydrocannabinol: NOT DETECTED

## 2021-11-20 LAB — AMMONIA: Ammonia: 67 umol/L — ABNORMAL HIGH (ref 9–35)

## 2021-11-20 LAB — GLUCOSE, CAPILLARY: Glucose-Capillary: 121 mg/dL — ABNORMAL HIGH (ref 70–99)

## 2021-11-20 LAB — ETHANOL: Alcohol, Ethyl (B): <10 mg/dL (ref <10)

## 2021-11-20 LAB — MAGNESIUM: Magnesium: 2.5 mg/dL — ABNORMAL HIGH (ref 1.7–2.4)

## 2021-11-20 MED ORDER — POTASSIUM CHLORIDE IN NACL 20-0.9 MEQ/L-% IV SOLN
INTRAVENOUS | Status: DC
  Filled 2021-11-20 (×16): qty 1000

## 2021-11-20 MED ORDER — LEVETIRACETAM IN NACL 500 MG/100ML IV SOLN
500.0000 mg | Freq: Two times a day (BID) | INTRAVENOUS | Status: DC
  Administered 2021-11-20 – 2021-11-21 (×2): 500 mg via INTRAVENOUS
  Filled 2021-11-20 (×2): qty 100

## 2021-11-20 MED ORDER — LACTATED RINGERS IV BOLUS
1000.0000 mL | Freq: Once | INTRAVENOUS | Status: AC
  Administered 2021-11-20: 1000 mL via INTRAVENOUS

## 2021-11-20 MED ORDER — METOCLOPRAMIDE HCL 5 MG/ML IJ SOLN
5.0000 mg | Freq: Four times a day (QID) | INTRAMUSCULAR | Status: DC
  Administered 2021-11-20 – 2021-11-21 (×3): 5 mg via INTRAVENOUS
  Filled 2021-11-20 (×3): qty 2

## 2021-11-20 MED ORDER — HYDRALAZINE HCL 20 MG/ML IJ SOLN: 15.0000 mg | INTRAMUSCULAR | Status: DC | PRN

## 2021-11-20 MED ORDER — ONDANSETRON HCL 4 MG PO TABS: 4.0000 mg | ORAL_TABLET | Freq: Four times a day (QID) | ORAL | Status: DC | PRN

## 2021-11-20 MED ORDER — LACTULOSE ENEMA
300.0000 mL | Freq: Once | ORAL | Status: AC
  Administered 2021-11-20: 300 mL via RECTAL
  Filled 2021-11-20: qty 300

## 2021-11-20 MED ORDER — ONDANSETRON HCL 4 MG/2ML IJ SOLN
4.0000 mg | Freq: Four times a day (QID) | INTRAMUSCULAR | Status: DC | PRN
  Administered 2021-11-21 (×2): 4 mg via INTRAVENOUS
  Filled 2021-11-20 (×3): qty 2

## 2021-11-20 MED ORDER — ACETAMINOPHEN 650 MG RE SUPP: 650.0000 mg | Freq: Four times a day (QID) | RECTAL | Status: DC | PRN

## 2021-11-20 MED ORDER — PANTOPRAZOLE SODIUM 40 MG IV SOLR
40.0000 mg | INTRAVENOUS | Status: DC
  Administered 2021-11-20: 40 mg via INTRAVENOUS
  Filled 2021-11-20: qty 10

## 2021-11-20 MED ORDER — METOCLOPRAMIDE HCL 5 MG/ML IJ SOLN
5.0000 mg | Freq: Once | INTRAMUSCULAR | Status: AC
  Administered 2021-11-20: 5 mg via INTRAVENOUS
  Filled 2021-11-20: qty 2

## 2021-11-20 MED ORDER — ACETAMINOPHEN 325 MG PO TABS
650.0000 mg | ORAL_TABLET | Freq: Four times a day (QID) | ORAL | Status: DC | PRN
  Administered 2021-11-22 – 2021-11-24 (×2): 650 mg via ORAL
  Filled 2021-11-20 (×2): qty 2

## 2021-11-20 MED ORDER — HYDRALAZINE HCL 20 MG/ML IJ SOLN
5.0000 mg | Freq: Once | INTRAMUSCULAR | Status: DC
  Filled 2021-11-20: qty 1

## 2021-11-20 NOTE — H&P (Signed)
History and Physical    Patient: Kristen Ramos BJS:[id number] DOB: Jul 16, 1960 DOA: 11/19/2021 DOS: the patient was seen and examined on 11/20/2021 PCP: Jordan Hawks, PA-C  Patient coming from: Home  Chief Complaint:  Chief Complaint  Patient presents with   Emesis   HPI: Kristen Ramos is a 61 y.o. female with medical history significant of allergic rhinitis, osteoarthritis, chronic back pain, GERD, rheumatoid arthritis, PRES, who presented to the emergency department via EMS due to abdominal pain, nausea and vomiting.  She sometimes responds to verbal stimuli, but does not answer questions.  She has had admissions for similar clinical picture in the past.  ED course: Initial vital signs were temperature 98 F, pulse 84, respiration 18, BP 147/94 mmHg and O2 sat 100% on room air.  The patient received 1000 mL of LR bolus, 1000 mL normal saline bolus and 5 mg of metoclopramide IVP.  Lab work: Her urinalysis ketonuria 50 mg/deciliter, trace proteinuria and rare bacteria.  UDS was negative.  CBC showed white count 7.5, hemoglobin 13.7 g/dL platelets 607.  Lipase was unremarkable.  Arterial blood gas showed pH of 7.47 and PCO2 of 30.7 mmHg.  The rest of the measurements were normal.  CMP with a CO2 of 20 mmol/L and an anion gap of 16, glucose 165 and calcium 10.3 mg/dL.  Total protein 9.1 g/dL.  The rest of the electrolytes, hepatic and renal function were normal.  Imaging: CT head without contrast was negative.   Review of Systems: As mentioned in the history of present illness. All other systems reviewed and are negative. Past Medical History:  Diagnosis Date   Allergic rhinitis    Arthritis    Chronic back pain    GERD (gastroesophageal reflux disease)    RA (rheumatoid arthritis) (HCC)    Past Surgical History:  Procedure Laterality Date   ABDOMINAL HYSTERECTOMY     BACK SURGERY     BIOPSY  04/19/2018   Procedure: BIOPSY;  Surgeon: Kerin Salen, MD;  Location: [facility];  Service:  Gastroenterology;;   CESAREAN SECTION     ESOPHAGOGASTRODUODENOSCOPY (EGD) WITH PROPOFOL N/A 04/19/2018   Procedure: ESOPHAGOGASTRODUODENOSCOPY (EGD) WITH PROPOFOL;  Surgeon: Kerin Salen, MD;  Location: [facility];  Service: Gastroenterology;  Laterality: N/A;   Social History:  reports that she has been smoking cigarettes. She has never used smokeless tobacco. She reports that she does not drink alcohol and does not use drugs.  Allergies  Allergen Reactions   Bee Venom Hives and Swelling    No family history on file.  Prior to Admission medications   Medication Sig Start Date End Date Taking? Authorizing Provider  benzonatate (TESSALON) 100 MG capsule Take 1-2 capsules (100-200 mg total) by mouth 3 (three) times daily as needed for cough. 11/18/21   Petrucelli, Samantha R, PA-C  Cholecalciferol (VITAMIN D-3 PO) Take 1 capsule by mouth daily.    [provider]  emtricitabine-tenofovir (TRUVADA) 200-300 MG tablet Take 1 tablet by mouth daily. 11/01/21   [provider]  folic acid (FOLVITE) 1 MG tablet Take 1 mg by mouth daily. 04/01/19   [provider]  HORIZANT 600 MG TBCR Take 1 tablet by mouth 2 (two) times daily. 10/30/21   [provider]  levETIRAcetam (KEPPRA) 500 MG tablet Take 1 tablet (500 mg total) by mouth 2 (two) times daily. 10/27/21   Rhetta Mura, MD  lidocaine (LIDODERM) 5 % Place 1 patch onto the skin daily as needed. Apply patch to area most significant  pain once per day.  Remove and discard patch within 12 hours of application. 11/18/21   Petrucelli, Pleas Koch, PA-C  meclizine (ANTIVERT) 25 MG tablet Take 1 tablet (25 mg total) by mouth 3 (three) times daily as needed for dizziness. 10/20/21   Derwood Kaplan, MD  methotrexate (RHEUMATREX) 2.5 MG tablet Take 15 mg by mouth every Friday. 03/31/19   [provider]  ondansetron (ZOFRAN-ODT) 4 MG disintegrating tablet Take 4 mg by mouth every 8 (eight) hours as needed for  nausea/vomiting. 10/18/21   [provider]  oxyCODONE (ROXICODONE) 15 MG immediate release tablet Take 1 tablet (15 mg total) by mouth 4 (four) times daily as needed for pain. 10/27/21   Rhetta Mura, MD  pantoprazole (PROTONIX) 40 MG tablet Take 1 tablet (40 mg total) by mouth daily. 06/22/19   Sheikh, Kateri Mc Latif, DO  polyethylene glycol (MIRALAX / GLYCOLAX) 17 g packet Take 17 g by mouth daily. Patient taking differently: Take 17 g by mouth daily as needed for mild constipation. 06/23/19   Marguerita Merles [place], DO    Physical Exam: Vitals:   11/20/21 0536 11/20/21 0600 11/20/21 0917 11/20/21 1028  BP: 130/81 129/87 (!) 158/79 (!) 152/84  Pulse: 85 96 77 97  Resp: (!) 23  12 18   Temp:   98.8 F (37.1 C) 98.9 F (37.2 C)  TempSrc:   Axillary Axillary  SpO2: 100% 100% 98% 100%  Weight:      Height:       Physical Exam Vitals and nursing note reviewed.  Constitutional:      Appearance: Normal appearance. She is obese.  HENT:     Head: Normocephalic.     Mouth/Throat:     Mouth: Mucous membranes are dry.  Eyes:     General: No scleral icterus.    Pupils: Pupils are equal, round, and reactive to light.  Neck:     Vascular: No JVD.  Cardiovascular:     Rate and Rhythm: Normal rate and regular rhythm.     Heart sounds: S1 normal and S2 normal.  Pulmonary:     Effort: No tachypnea.     Breath sounds: No wheezing, rhonchi or rales.  Abdominal:     General: Bowel sounds are normal. There is no distension.     Palpations: Abdomen is soft.     Tenderness: There is no abdominal tenderness. There is no guarding.  Musculoskeletal:     Cervical back: Neck supple.     Right lower leg: No edema.     Left lower leg: No edema.  Skin:    General: Skin is warm and dry.  Neurological:     General: No focal deficit present.     Mental Status: She is disoriented and confused.     Comments: Sometimes answers simple questions.  Psychiatric:     Comments: Mildly restless.   Encephalopathic.   Data Reviewed:  Results are pending, will review when available.  Assessment and Plan: Principal Problem:   AMS (altered mental status)   Acute encephalopathy Hepatic versus metabolic. Inpatient/PCU. Continue IV fluids. Treat hyperammonemia. Correct electrolytes. Neurochecks. Supportive care.  Active Problems:   Hyperammonemia (HCC) Lactulose enema. Follow-up ammonia level in the morning.    Intractable nausea and vomiting In the setting of:   Cyclic vomiting syndrome Metoclopramide 5 mg every 6 hours. As needed antiemetics.    Hypokalemia Correcting. Follow-up potassium level.    Impaired fasting blood sugar Check CBG every 6 hours.  GERD (gastroesophageal reflux disease) Pantoprazole 40 mg IVP daily. Switch to oral form once patient is cleared for oral intake.    Tobacco abuse Nicotine replacement therapy as needed.    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 Follow-up with rheumatology as an outpatient.    Advance Care Planning:   Code Status: Full Code   Consults:   Family Communication:   Severity of Illness: The appropriate patient status for this patient is INPATIENT. Inpatient status is judged to be reasonable and necessary in order to provide the required intensity of service to ensure the patient's safety. The patient's presenting symptoms, physical exam findings, and initial radiographic and laboratory data in the context of their chronic comorbidities is felt to place them at high risk for further clinical deterioration. Furthermore, it is not anticipated that the patient will be medically stable for discharge from the hospital within 2 midnights of admission.   * I certify that at the point of admission it is my clinical judgment that the patient will require inpatient hospital care spanning beyond 2 midnights from the point of admission due to high intensity of service, high risk for further deterioration and high  frequency of surveillance required.*  Author: Bobette Mo, MD 11/20/2021 10:33 AM  For on call review [email or web address].   This document was prepared using Dragon voice recognition software and may contain some unintended transcription errors.

## 2021-11-20 NOTE — ED Notes (Addendum)
ED Provider at bedside. MD responded to bedside for assement

## 2021-11-20 NOTE — ED Provider Notes (Signed)
  Physical Exam  BP 129/87   Pulse 96   Temp 99.9 F (37.7 C)   Resp (!) 23   Ht 1.676 m (5\' 6" )   Wt 94.8 kg   SpO2 100%   BMI 33.73 kg/m   Physical Exam  Procedures  Procedures  ED Course / MDM   Clinical Course as of 11/20/21 0755  [facility] Nov 20, 2021  Nov 22, 2021 Per nursing, patient unsteady on her feet and more confused. Recent history of encephalopathy and prolonged obtunded state which was thought to be secondary to medications and PRESS.  Added UDS and EtOH.  Requested in and out cath.  Will obtain CT.  BPs 180s/90s. [CH]  [phone] Spoke with daughter.  Keppra was discontinued.  She has continued her methotrexate because she has not been able to get into her doctor to taper this.  Last dose was Saturday.  She does not normally have high blood pressure. [CH]  [phone] Daughter called me back.  Did state that she may be was somewhat confused to evaluation but she thought she just was agitated because of her vomiting. [CH]  [phone] Patient has not received any sedating medications while in the emergency department.  Repeat blood pressure is 130/81. [CH]  [phone] Patient presented for cyclical vomiting. REceived reglan and fluids She is increasingly  On methotrexate and previously thought to have some cns toxicity and was supposed to d/c but took regular dose. She is admitted to hospitalist No fever no headache- previous mri suggestive of PRES but no hypertension at that time Today ct clear Pending admission [DR]    Clinical Course User Index [CH] Horton, [id number], MD [DR] Mayer Masker, MD   Medical Decision Making Patient remains stable ABG reviewed wnl Patient awake and alert per Care[facility]- improved from prior exam Ammonia pending Patient being transported to [facility]  Amount and/or Complexity of Data Reviewed Labs: ordered. Decision-making details documented in ED Course. Radiology: ordered.  Risk Prescription drug management. Decision regarding hospitalization.   Patient signed  out by Dr. Margarita Grizzle and bed now ready. RN states patient continues confused. Patient evaluated at bedside. Airway is clear Patient follows directions with prompting Patient without focal or lateralized deficits. Ammonia, abg, and uds added       Wilkie Aye, MD 11/20/21 0930

## 2021-11-20 NOTE — ED Notes (Signed)
Pt in bed lying on her face with her posterior end in the air. Pt will respond to questions but is only oriented to self. Pt also mumbles responses with unintelligible words.

## 2021-11-20 NOTE — ED Notes (Signed)
Pajama top, socks, cell phone,and cell phone charger placed in patient belonging bag and with patient.

## 2021-11-20 NOTE — ED Notes (Signed)
Attempted to walk pt to the restroom with no success. MD notified

## 2021-11-20 NOTE — ED Notes (Signed)
Report given to carelink 

## 2021-11-20 NOTE — ED Provider Notes (Signed)
[facility] EMERGENCY DEPT Provider Note   CSN: [id number] Arrival date & time: 11/19/21  1800     History  Chief Complaint  Patient presents with   Emesis    Kristen Ramos is a 61 y.o. female.  HPI     This is a 61 year old female with a history of cyclic vomiting who presents with her daughter with concerns for vomiting.  Daughter provides most of the history.  Recently was evaluated for chest pain and had a negative work-up.  Per the patient's daughter, she has several episodes per year of cyclic vomiting.  She last had an episode several weeks ago.  It is unusual for her to have 2 episodes weeks apart but her daughter states she has been under more stress lately.  She normally takes Zofran and Reglan at home but did not take her Reglan yesterday.  She began to have nonbilious, nonbloody emesis today.  Patient reports "pain all over."  However, daughter states that this is normal for her.  Has not had any fevers.  Of note, patient with recent admission for encephalopathy.  Thought to be medication related and/or related to PRESS although she has no history of hypertension.  She was given multiple sedating medications while in the ED.  There was also some thought that her methotrexate was contributing.  Home Medications Prior to Admission medications   Medication Sig Start Date End Date Taking? Authorizing Provider  benzonatate (TESSALON) 100 MG capsule Take 1-2 capsules (100-200 mg total) by mouth 3 (three) times daily as needed for cough. 11/18/21   Petrucelli, Samantha R, PA-C  Cholecalciferol (VITAMIN D-3 PO) Take 1 capsule by mouth daily.    [provider]  folic acid (FOLVITE) 1 MG tablet Take 1 mg by mouth daily. 04/01/19   [provider]  levETIRAcetam (KEPPRA) 500 MG tablet Take 1 tablet (500 mg total) by mouth 2 (two) times daily. 10/27/21   Rhetta Mura, MD  lidocaine (LIDODERM) 5 % Place 1 patch onto the skin daily as needed. Apply  patch to area most significant pain once per day.  Remove and discard patch within 12 hours of application. 11/18/21   Petrucelli, Pleas Koch, PA-C  meclizine (ANTIVERT) 25 MG tablet Take 1 tablet (25 mg total) by mouth 3 (three) times daily as needed for dizziness. 10/20/21   Derwood Kaplan, MD  methotrexate (RHEUMATREX) 2.5 MG tablet Take 15 mg by mouth every Friday. 03/31/19   [provider]  ondansetron (ZOFRAN-ODT) 4 MG disintegrating tablet Take 4 mg by mouth every 8 (eight) hours as needed for nausea/vomiting. 10/18/21   [provider]  oxyCODONE (ROXICODONE) 15 MG immediate release tablet Take 1 tablet (15 mg total) by mouth 4 (four) times daily as needed for pain. 10/27/21   Rhetta Mura, MD  pantoprazole (PROTONIX) 40 MG tablet Take 1 tablet (40 mg total) by mouth daily. 06/22/19   Sheikh, Kateri Mc Latif, DO  polyethylene glycol (MIRALAX / GLYCOLAX) 17 g packet Take 17 g by mouth daily. Patient taking differently: Take 17 g by mouth daily as needed for mild constipation. 06/23/19   Marguerita Merles Latif, DO      Allergies    Bee venom    Review of Systems   Review of Systems  Constitutional:  Negative for fever.  Gastrointestinal:  Positive for abdominal pain and vomiting.  All other systems reviewed and are negative.   Physical Exam Updated Vital Signs BP 130/81   Pulse 85   Temp 99.9  F (37.7 C)   Resp (!) 23   Ht 1.676 m (5\' 6" )   Wt 94.8 kg   SpO2 100%   BMI 33.73 kg/m  Physical Exam Vitals and nursing note reviewed.  Constitutional:      Appearance: She is well-developed.     Comments: Chronically ill-appearing but nontoxic  HENT:     Head: Normocephalic and atraumatic.     Mouth/Throat:     Mouth: Mucous membranes are dry.  Eyes:     Pupils: Pupils are equal, round, and reactive to light.  Cardiovascular:     Rate and Rhythm: Normal rate and regular rhythm.     Heart sounds: Normal heart sounds.  Pulmonary:     Effort: Pulmonary effort is  normal. No respiratory distress.     Breath sounds: No wheezing.  Abdominal:     General: Bowel sounds are normal.     Palpations: Abdomen is soft.     Tenderness: There is no abdominal tenderness.  Musculoskeletal:     Cervical back: Neck supple.  Skin:    General: Skin is warm and dry.  Neurological:     Mental Status: She is alert and oriented to person, place, and time.  Psychiatric:        Mood and Affect: Mood normal.     ED Results / Procedures / Treatments   Labs (all labs ordered are listed, but only abnormal results are displayed) Labs Reviewed  CBC - Abnormal; Notable for the following components:      Result Value   Platelets 420 (*)    All other components within normal limits  URINALYSIS, ROUTINE W REFLEX MICROSCOPIC - Abnormal; Notable for the following components:   Ketones, ur 15 (*)    Protein, ur TRACE (*)    Bacteria, UA RARE (*)    All other components within normal limits  COMPREHENSIVE METABOLIC PANEL - Abnormal; Notable for the following components:   CO2 20 (*)    Glucose, Bld 165 (*)    Calcium 10.4 (*)    Total Protein 9.1 (*)    Anion gap 16 (*)    All other components within normal limits  LIPASE, BLOOD - Abnormal; Notable for the following components:   Lipase 10 (*)    All other components within normal limits  CBG MONITORING, ED - Abnormal; Notable for the following components:   Glucose-Capillary 161 (*)    All other components within normal limits  RAPID URINE DRUG SCREEN, HOSP PERFORMED  ETHANOL    EKG EKG Interpretation  Date/Time:  Monday November 20 2021 05:35:21 EDT Ventricular Rate:  85 PR Interval:  133 QRS Duration: 85 QT Interval:  390 QTC Calculation: 464 R Axis:   67 Text Interpretation: Sinus rhythm Confirmed by Ross Marcus ([id number]) on 11/20/2021 5:39:21 AM  Radiology CT Head Wo Contrast  Result Date: 11/20/2021 CLINICAL DATA:  Mental status change with unknown cause EXAM: CT HEAD WITHOUT CONTRAST  TECHNIQUE: Contiguous axial images were obtained from the base of the skull through the vertex without intravenous contrast. RADIATION DOSE REDUCTION: This exam was performed according to the departmental dose-optimization program which includes automated exposure control, adjustment of the mA and/or kV according to patient size and/or use of iterative reconstruction technique. COMPARISON:  Brain MRI 10/26/2021 FINDINGS: Brain: No evidence of acute infarction, hemorrhage, hydrocephalus, extra-axial collection or mass lesion/mass effect. FLAIR hyperintensity on recent brain MRI is not detected by CT. Vascular: No hyperdense vessel or unexpected calcification. Skull: Normal.  Negative for fracture or focal lesion. Sinuses/Orbits: No acute finding. IMPRESSION: Negative head CT. Electronically Signed   By: Tiburcio Pea M.D.   On: 11/20/2021 05:14    Procedures .Critical Care  Performed by: Shon Baton, MD Authorized by: Shon Baton, MD   Critical care provider statement:    Critical care time (minutes):  70   Critical care was necessary to treat or prevent imminent or life-threatening deterioration of the following conditions: encephalopathy.   Critical care was time spent personally by me on the following activities:  Development of treatment plan with patient or surrogate, discussions with consultants, evaluation of patient's response to treatment, examination of patient, ordering and review of laboratory studies, ordering and review of radiographic studies, ordering and performing treatments and interventions, pulse oximetry, re-evaluation of patient's condition and review of old charts     Medications Ordered in ED Medications  ondansetron (ZOFRAN) injection 4 mg (4 mg Intravenous Patient Refused/Not Given 11/19/21 2351)  hydrALAZINE (APRESOLINE) injection 5 mg (0 mg Intravenous Hold 11/20/21 0538)  sodium chloride 0.9 % bolus 1,000 mL (0 mLs Intravenous Stopped 11/20/21 0044)   metoCLOPramide (REGLAN) injection 5 mg (5 mg Intravenous Given 11/19/21 2351)  metoCLOPramide (REGLAN) injection 5 mg (5 mg Intravenous Given 11/20/21 0234)    ED Course/ Medical Decision Making/ A&P Clinical Course as of 11/20/21 0543  Mon Nov 20, 2021  4098 Per nursing, patient unsteady on her feet and more confused. Recent history of encephalopathy and prolonged obtunded state which was thought to be secondary to medications and PRESS.  Added UDS and EtOH.  Requested in and out cath.  Will obtain CT.  BPs 180s/90s. [CH]  [phone] Spoke with daughter.  Keppra was discontinued.  She has continued her methotrexate because she has not been able to get into her doctor to taper this.  Last dose was Saturday.  She does not normally have high blood pressure. [CH]  [phone] Daughter called me back.  Did state that she may be was somewhat confused to evaluation but she thought she just was agitated because of her vomiting. [CH]  [phone] Patient has not received any sedating medications while in the emergency department.  Repeat blood pressure is 130/81. [CH]    Clinical Course User Index [CH] Ileane Sando, Mayer Masker, MD                           Medical Decision Making Amount and/or Complexity of Data Reviewed Labs: ordered. Radiology: ordered.  Risk Prescription drug management.   This patient presents to the ED for concern of nausea and vomiting, altered mental status, this involves an extensive number of treatment options, and is a complaint that carries with it a high risk of complications and morbidity.  I considered the following differential and admission for this acute, potentially life threatening condition.  The differential diagnosis includes cyclic vomiting, pancreatitis, gastritis, encephalopathy, acute infection, intoxication, medication side effect  MDM:    This is a 61 year old female who initially presented with complaints of nausea and vomiting.  Per the patient's daughter, she was up and  walking around and mostly having her normal episode of cyclic vomiting.  However, while in the emergency room but she became more more altered.  She is now only oriented to herself.  She cannot ambulate independently to the bathroom.  She has not received any sedating medications while in the emergency department.  She has received fluids and Reglan for  her vomiting.  Labs obtained and reviewed.  She does not show as significant dehydration as she did upon prior admission.  She does have 15 ketones in the urine.  No leukocytosis.  No evidence of UTI.  See clinical course above.  With progressive encephalopathy, additional work-up was initiated including UDS and EtOH which is negative.  CT scan is fairly reassuring.  Daughter confirmed that she has not yet stopped her methotrexate.  This could be culprit.  She remains afebrile.  Initially was going to treat blood pressure which has been persistently elevated.  She has no history of high blood pressure.  However, prior to treatment, patient's blood pressure down trended to 130/81.  This was held.  Given that she is acutely altered and not back to her baseline, she will need admission.  May need repeat neurology evaluation.  We will hold methotrexate at this time.  Has not had any seizure-like activity while in the emergency room.  (Labs, imaging, consults)  Labs: I Ordered, and personally interpreted labs.  The pertinent results include: CBC, CMP, lipase, urinalysis, EtOH, UDS  Imaging Studies ordered: I ordered imaging studies including CT head I independently visualized and interpreted imaging. I agree with the radiologist interpretation  Additional history obtained from daughter.  External records from outside source obtained and reviewed including prior admission history and discharge summary  Cardiac Monitoring: The patient was maintained on a cardiac monitor.  I personally viewed and interpreted the cardiac monitored which showed an underlying  rhythm of: Sinus rhythm  Reevaluation: After the interventions noted above, I reevaluated the patient and found that they have :worsened  Social Determinants of Health: Lives independently  Disposition: Admit  Co morbidities that complicate the patient evaluation  Past Medical History:  Diagnosis Date   Allergic rhinitis    Arthritis    Chronic back pain    GERD (gastroesophageal reflux disease)    RA (rheumatoid arthritis) (HCC)      Medicines Meds ordered this encounter  Medications   ondansetron (ZOFRAN) injection 4 mg   sodium chloride 0.9 % bolus 1,000 mL   metoCLOPramide (REGLAN) injection 5 mg   metoCLOPramide (REGLAN) injection 5 mg   hydrALAZINE (APRESOLINE) injection 5 mg    I have reviewed the patients home medicines and have made adjustments as needed  Problem List / ED Course: Problem List Items Addressed This Visit       Digestive   Cyclic vomiting syndrome   Other Visit Diagnoses     Encephalopathy    -  Primary                   Final Clinical Impression(s) / ED Diagnoses Final diagnoses:  Encephalopathy  Cyclic vomiting syndrome    Rx / DC Orders ED Discharge Orders     None         Shon Baton, MD 11/20/21 [phone]

## 2021-11-21 ENCOUNTER — Encounter: Payer: Self-pay | Admitting: *Deleted

## 2021-11-21 DIAGNOSIS — I10 Essential (primary) hypertension: Secondary | ICD-10-CM | POA: Diagnosis not present

## 2021-11-21 DIAGNOSIS — E669 Obesity, unspecified: Secondary | ICD-10-CM

## 2021-11-21 DIAGNOSIS — K219 Gastro-esophageal reflux disease without esophagitis: Secondary | ICD-10-CM | POA: Diagnosis not present

## 2021-11-21 DIAGNOSIS — G9341 Metabolic encephalopathy: Secondary | ICD-10-CM

## 2021-11-21 DIAGNOSIS — R1115 Cyclical vomiting syndrome unrelated to migraine: Secondary | ICD-10-CM | POA: Diagnosis not present

## 2021-11-21 LAB — COMPREHENSIVE METABOLIC PANEL
ALT: 18 U/L (ref 0–44)
AST: 22 U/L (ref 15–41)
Albumin: 3.7 g/dL (ref 3.5–5.0)
Alkaline Phosphatase: 94 U/L (ref 38–126)
Anion gap: 11 (ref 5–15)
BUN: 10 mg/dL (ref 6–20)
CO2: 19 mmol/L — ABNORMAL LOW (ref 22–32)
Calcium: 9.1 mg/dL (ref 8.9–10.3)
Chloride: 113 mmol/L — ABNORMAL HIGH (ref 98–111)
Creatinine, Ser: 0.7 mg/dL (ref 0.44–1.00)
GFR, Estimated: >60 mL/min (ref >60)
Glucose, Bld: 105 mg/dL — ABNORMAL HIGH (ref 70–99)
Potassium: 3.9 mmol/L (ref 3.5–5.1)
Sodium: 143 mmol/L (ref 135–145)
Total Bilirubin: 1.2 mg/dL (ref 0.3–1.2)
Total Protein: 8.4 g/dL — ABNORMAL HIGH (ref 6.5–8.1)

## 2021-11-21 LAB — GLUCOSE, CAPILLARY
Glucose-Capillary: 107 mg/dL — ABNORMAL HIGH (ref 70–99)
Glucose-Capillary: 111 mg/dL — ABNORMAL HIGH (ref 70–99)
Glucose-Capillary: 95 mg/dL (ref 70–99)

## 2021-11-21 LAB — CBC
HCT: 51.9 % — ABNORMAL HIGH (ref 36.0–46.0)
Hemoglobin: 16.4 g/dL — ABNORMAL HIGH (ref 12.0–15.0)
MCH: 31.5 pg (ref 26.0–34.0)
MCHC: 31.6 g/dL (ref 30.0–36.0)
MCV: 99.8 fL (ref 80.0–100.0)
Platelets: 394 10*3/uL (ref 150–400)
RBC: 5.2 MIL/uL — ABNORMAL HIGH (ref 3.87–5.11)
RDW: 13.9 % (ref 11.5–15.5)
WBC: 10.2 10*3/uL (ref 4.0–10.5)
nRBC: 0 % (ref 0.0–0.2)

## 2021-11-21 LAB — AMMONIA: Ammonia: 20 umol/L (ref 9–35)

## 2021-11-21 MED ORDER — PANTOPRAZOLE SODIUM 40 MG PO TBEC
40.0000 mg | DELAYED_RELEASE_TABLET | Freq: Every day | ORAL | Status: DC
  Administered 2021-11-21 – 2021-11-27 (×7): 40 mg via ORAL
  Filled 2021-11-21 (×7): qty 1

## 2021-11-21 MED ORDER — OXYCODONE HCL 5 MG PO TABS
15.0000 mg | ORAL_TABLET | Freq: Four times a day (QID) | ORAL | Status: DC | PRN
  Administered 2021-11-27 (×2): 15 mg via ORAL
  Filled 2021-11-21 (×2): qty 3

## 2021-11-21 MED ORDER — MECLIZINE HCL 25 MG PO TABS
25.0000 mg | ORAL_TABLET | Freq: Three times a day (TID) | ORAL | Status: DC | PRN
  Administered 2021-11-21: 25 mg via ORAL
  Filled 2021-11-21: qty 1

## 2021-11-21 MED ORDER — LABETALOL HCL 5 MG/ML IV SOLN: 10.0000 mg | Freq: Four times a day (QID) | INTRAVENOUS | Status: DC | PRN

## 2021-11-21 MED ORDER — AMLODIPINE BESYLATE 5 MG PO TABS
5.0000 mg | ORAL_TABLET | Freq: Every day | ORAL | Status: DC
  Administered 2021-11-21 – 2021-11-23 (×3): 5 mg via ORAL
  Filled 2021-11-21 (×3): qty 1

## 2021-11-21 MED ORDER — METOCLOPRAMIDE HCL 5 MG/ML IJ SOLN
10.0000 mg | Freq: Four times a day (QID) | INTRAMUSCULAR | Status: DC
  Administered 2021-11-21 – 2021-11-27 (×21): 10 mg via INTRAVENOUS
  Filled 2021-11-21 (×23): qty 2

## 2021-11-21 MED ORDER — FOLIC ACID 1 MG PO TABS
1.0000 mg | ORAL_TABLET | Freq: Every day | ORAL | Status: DC
  Administered 2021-11-21 – 2021-11-27 (×7): 1 mg via ORAL
  Filled 2021-11-21 (×8): qty 1

## 2021-11-21 MED ORDER — ENOXAPARIN SODIUM 40 MG/0.4ML IJ SOSY
40.0000 mg | PREFILLED_SYRINGE | INTRAMUSCULAR | Status: DC
  Administered 2021-11-21 – 2021-11-26 (×6): 40 mg via SUBCUTANEOUS
  Filled 2021-11-21 (×7): qty 0.4

## 2021-11-21 NOTE — Assessment & Plan Note (Signed)
-   Supplemented and resolved 

## 2021-11-21 NOTE — Assessment & Plan Note (Signed)
Estimated body mass index is 33.58 kg/m as calculated from the following:   Height as of this encounter: 5' (1.524 m).   Weight as of this encounter: 78 kg.   -This will complicate overall prognosis 

## 2021-11-21 NOTE — Assessment & Plan Note (Signed)
Probably clinically insignificant.  She had transient hyperammonemia yesterday.  This is now completely resolved, she does not have any symptoms of hepatic encephalopathy, no history of liver disease.

## 2021-11-21 NOTE — Progress Notes (Signed)
Progress Note   Patient: Kristen Ramos SNK:[id number] DOB: [birth year] DOA: 11/19/2021     1 DOS: the patient was seen and examined on 11/21/2021 at 8:30AM      Brief hospital course: Kristen Ramos is a 61 y.o. 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  In the ER, CTH normal, urine ketonuria, lipase normal, ABG with mild alkalosis, electrolytes and renal function mostly normal.     9/11: Admitted on fluids, Reglan 9/12: Mentation normal     Assessment and Plan: * Cyclic vomiting syndrome - Continue IV fluids - Continue IV reglan scheduled - Continue Ondansetron PRN - Continue PPI      Accelerated hypertension Does not take blood pressure medicines at baseline.    Here blood pressures been 160-180 in the last 24 hours.  Given pressures with blood pressures in the 170 range during last hospitalization, will be more strict - Continue PRN hydralazine and labetalol to maintain blood pressure less than 160 - Start new amlodipine     Obesity (BMI 30-39.9) BMI 33  Acute metabolic encephalopathy At baseline, she is alert, independent, no cognitive impairment.  On admission, she was poorly responsive.  This seems to have resolved.  No seizure-like activity noted.  Recurrent process possible (I am uncertain if she stopped her methotrexate, which neurology thought during the last hospitalization might be related), but either way today her mentation appears to be normal.  Admittedly she does not respond to most questions, but this is clearly because she is in a lot of discomfort from nausea, not because she is encephalopathic anymore.  She clearly follows instructions from her daughter, and answers some questions and is oriented with me.  Hyperammonemia (HCC) Probably clinically insignificant.  She had transient hyperammonemia  yesterday.  This is now completely resolved, she does not have any symptoms of hepatic encephalopathy, no history of liver disease.  Tobacco abuse    GERD (gastroesophageal reflux disease) - Continue pantoprazole  Hypokalemia Supplemented and resolved  Rheumatoid arthritis (HCC) During last hospitalization, neurology recommended titrating off methotrexate. - Hold methotrexate - Follow-up with rheumatology          Subjective: Patient still has nausea, still vomiting.  Unable to take anything by mouth.  Complains of abdominal pain, diffuse.    Physical Exam: BP (!) 141/120 (BP Location: Left Arm)   Pulse 83   Temp 98.3 F (36.8 C)   Resp 18   Ht 5\' 6"  (1.676 m)   Wt 94.8 kg   SpO2 100%   BMI 33.73 kg/m   Obese adult female, sitting in bed on her hands and knees, appears uncomfortable RRR, no murmurs, no peripheral edema Respiratory rate normal, lungs clear without rales or wheezes Difficult to obtain abdominal exam, she mostly refuses to turn from a position on her hands and knees, although with her daughters help she does and I can tell that she has some voluntary guarding, but no concerning rigidity, tenderness, rebound, or mass Attention diminished, but mostly due to pain, nausea, face symmetric, speech fluent, oriented to person, place, time  Data Reviewed: Basic metabolic panel shows normal lites calcium, normalized potassium, bicarb slightly low, creatinine normal Ammonia normalized PCO2 low, respiratory alkalosis Hemoglobin elevated  Family Communication: Daughter at the bedside    Disposition: Status is: Inpatient The patient was admitted for cyclic vomiting syndrome  She had some transient encephalopathy, so we will manage her blood pressure strictly  However I suspect she will just need several days of antiemetics, IV fluids, and then transition to an oral diet later this week and discharged home        Author: Alberteen Sam,  MD 11/21/2021 5:36 PM  For on call review [email or web address].

## 2021-11-21 NOTE — Assessment & Plan Note (Signed)
-   Continue IV fluids - Continue IV reglan scheduled - Continue Ondansetron PRN - Continue PPI

## 2021-11-21 NOTE — Assessment & Plan Note (Signed)
At baseline, she is alert, independent, no cognitive impairment.  On admission, she was poorly responsive.  This seems to have resolved.  No seizure-like activity noted.  Recurrent process possible (I am uncertain if she stopped her methotrexate, which neurology thought during the last hospitalization might be related), but either way today her mentation appears to be normal.  Admittedly she does not respond to most questions, but this is clearly because she is in a lot of discomfort from nausea, not because she is encephalopathic anymore.  She clearly follows instructions from her daughter, and answers some questions and is oriented with me.

## 2021-11-21 NOTE — Assessment & Plan Note (Signed)
During last hospitalization, neurology recommended titrating off methotrexate. - Hold methotrexate - Follow-up with rheumatology

## 2021-11-21 NOTE — Assessment & Plan Note (Signed)
Continue pantoprazole. °

## 2021-11-21 NOTE — Assessment & Plan Note (Addendum)
Does not take blood pressure medicines at baseline.    Here blood pressures been 160-180 in the last 24 hours.  Given pressures with blood pressures in the 170 range during last hospitalization, will be more strict - Continue PRN hydralazine and labetalol to maintain blood pressure less than 160 - Start new amlodipine

## 2021-11-22 DIAGNOSIS — R1115 Cyclical vomiting syndrome unrelated to migraine: Secondary | ICD-10-CM | POA: Diagnosis not present

## 2021-11-22 LAB — GLUCOSE, CAPILLARY
Glucose-Capillary: 87 mg/dL (ref 70–99)
Glucose-Capillary: 116 mg/dL — ABNORMAL HIGH (ref 70–99)

## 2021-11-22 LAB — COMPREHENSIVE METABOLIC PANEL
ALT: 22 U/L (ref 0–44)
AST: 24 U/L (ref 15–41)
Albumin: 3.4 g/dL — ABNORMAL LOW (ref 3.5–5.0)
Alkaline Phosphatase: 87 U/L (ref 38–126)
Anion gap: 9 (ref 5–15)
BUN: 9 mg/dL (ref 6–20)
CO2: 19 mmol/L — ABNORMAL LOW (ref 22–32)
Calcium: 8.9 mg/dL (ref 8.9–10.3)
Chloride: 109 mmol/L (ref 98–111)
Creatinine, Ser: 0.6 mg/dL (ref 0.44–1.00)
GFR, Estimated: >60 mL/min (ref >60)
Glucose, Bld: 97 mg/dL (ref 70–99)
Potassium: 4 mmol/L (ref 3.5–5.1)
Sodium: 137 mmol/L (ref 135–145)
Total Bilirubin: 1.3 mg/dL — ABNORMAL HIGH (ref 0.3–1.2)
Total Protein: 7.7 g/dL (ref 6.5–8.1)

## 2021-11-22 LAB — MAGNESIUM: Magnesium: 2 mg/dL (ref 1.7–2.4)

## 2021-11-22 LAB — CBC
HCT: 43.5 % (ref 36.0–46.0)
Hemoglobin: 14.4 g/dL (ref 12.0–15.0)
MCH: 31.4 pg (ref 26.0–34.0)
MCHC: 33.1 g/dL (ref 30.0–36.0)
MCV: 95 fL (ref 80.0–100.0)
Platelets: 393 10*3/uL (ref 150–400)
RBC: 4.58 MIL/uL (ref 3.87–5.11)
RDW: 13.4 % (ref 11.5–15.5)
WBC: 8.6 10*3/uL (ref 4.0–10.5)
nRBC: 0 % (ref 0.0–0.2)

## 2021-11-22 NOTE — Evaluation (Signed)
Physical Therapy Evaluation Patient Details Name: Kristen Ramos MRN: [id number] DOB: [birth year] Today's Date: 11/22/2021  History of Present Illness  61 years old female with PMH significant for rheumatoid arthritis on methotrexate, history of AVN, cyclic vomiting syndrome, obesity, recent admission for CVS in which she had seizure and PRES and chronic back pain who presented with vomiting and appeared to have confusion.  Clinical Impression  Pt admitted with above diagnosis.  Pt currently with functional limitations due to the deficits listed below (see PT Problem List). Pt will benefit from skilled PT to increase their independence and safety with mobility to allow discharge to the venue listed below.   Pt with decreased cognition limiting mobility today.  Pt was able to be assisted OOB to recliner however not yet safe to ambulate.  Daughter present and reports cognition is not at baseline and goal is for pt to return home.        Recommendations for follow up therapy are one component of a multi-disciplinary discharge planning process, led by the attending physician.  Recommendations may be updated based on patient status, additional functional criteria and insurance authorization.  Follow Up Recommendations Home health PT      Assistance Recommended at Discharge Frequent or constant Supervision/Assistance  Patient can return home with the following  A lot of help with walking and/or transfers;A lot of help with bathing/dressing/bathroom;Direct supervision/assist for medications management;Direct supervision/assist for financial management;Help with stairs or ramp for entrance;Assistance with feeding    Equipment Recommendations Rolling walker (2 wheels) (TBD on progress)  Recommendations for Other Services       Functional Status Assessment Patient has had a recent decline in their functional status and demonstrates the ability to make significant improvements in function in a  reasonable and predictable amount of time.     Precautions / Restrictions Precautions Precautions: Fall      Mobility  Bed Mobility Overal bed mobility: Needs Assistance Bed Mobility: Supine to Sit     Supine to sit: Total assist, +2 for physical assistance     General bed mobility comments: pt not following commands and required encouragement, total assist likely due to not wanting to participate?    Transfers Overall transfer level: Needs assistance Equipment used: 2 person hand held assist Transfers: Sit to/from Stand, Bed to chair/wheelchair/BSC Sit to Stand: Min assist   Step pivot transfers: Min assist       General transfer comment: provided bil HHA due to pt's confusion, assist to rise and steady, multimodal cues for guiding pt safely over to recliner    Ambulation/Gait                  Stairs            Wheelchair Mobility    Modified Rankin (Stroke Patients Only)       Balance Overall balance assessment: Needs assistance         Standing balance support: Bilateral upper extremity supported, During functional activity Standing balance-Leahy Scale: Poor                               Pertinent Vitals/Pain Pain Assessment Pain Assessment: Faces Faces Pain Scale: Hurts little more Pain Location: uncertain, possibly with urination as pt reports but pt poor historian at this time Pain Descriptors / Indicators: Crying Pain Intervention(s): Repositioned, Monitored during session (RN informed)    Home Living Family/patient expects to be discharged to:: Private  residence Living Arrangements: Other relatives;Spouse/significant other (son in Sports coach and granddaughter) Available Help at Discharge: Family Type of Home: Apartment Home Access: Level entry     Alternate Level Stairs-Number of Steps: 14 Home Layout: Two level Home Equipment: None Additional Comments: lives with spouse, caretaker for her niece - per daughter in room     Prior Function Prior Level of Function : Independent/Modified Independent;Driving (denies falls)             Mobility Comments: reports independence in mobility; did not use AD ADLs Comments: reports independent with ADLs, IADLs, and driving including for grocery shopping and errands     Hand Dominance   Dominant Hand: Right    Extremity/Trunk Assessment   Upper Extremity Assessment Upper Extremity Assessment:  (UE assessment as below from last admission and appears accurate) RUE Deficits / Details: 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, arthritic deformities RUE Coordination: decreased fine motor LUE Deficits / Details: 1 PIP contracture LUE Coordination: decreased fine motor    Lower Extremity Assessment Lower Extremity Assessment: Generalized weakness       Communication   Communication: Expressive difficulties;Other (comment);Receptive difficulties (decreased responses, pt remains confused)  Cognition Arousal/Alertness: Awake/alert Behavior During Therapy: Flat affect Overall Cognitive Status: Impaired/Different from baseline Area of Impairment: Following commands, Safety/judgement, Awareness, Problem solving                 Orientation Level: Disoriented to, Situation, Place     Following Commands: Follows one step commands with increased time, Follows one step commands consistently Safety/Judgement: Decreased awareness of safety, Decreased awareness of deficits Awareness: Intellectual Problem Solving: Slow processing, Decreased initiation, Difficulty sequencing, Requires verbal cues, Requires tactile cues General Comments: likes to keep eyes closed, flat affect. inconsistent with following directions; daughter present and providing encouragement        General Comments      Exercises     Assessment/Plan    PT Assessment Patient needs continued PT services  PT Problem List Decreased strength;Decreased activity tolerance;Decreased  balance;Decreased mobility;Decreased cognition;Decreased knowledge of use of DME;Decreased safety awareness       PT Treatment Interventions DME instruction;Gait training;Stair training;Functional mobility training;Therapeutic activities;Therapeutic exercise;Balance training;Cognitive remediation;Neuromuscular re-education;Patient/family education    PT Goals (Current goals can be found in the Care Plan section)  Acute Rehab PT Goals PT Goal Formulation: With patient/family Time For Goal Achievement: 12/06/21 Potential to Achieve Goals: Good    Frequency Min 3X/week     Co-evaluation               AM-PAC PT "6 Clicks" Mobility  Outcome Measure Help needed turning from your back to your side while in a flat bed without using bedrails?: Total Help needed moving from lying on your back to sitting on the side of a flat bed without using bedrails?: Total Help needed moving to and from a bed to a chair (including a wheelchair)?: Total Help needed standing up from a chair using your arms (e.g., wheelchair or bedside chair)?: Total Help needed to walk in hospital room?: Total Help needed climbing 3-5 steps with a railing? : Total 6 Click Score: 6    End of Session   Activity Tolerance: Other (comment) (limited by cognition) Patient left: in chair;with call bell/phone within reach;with chair alarm set;with family/visitor present Nurse Communication: Mobility status PT Visit Diagnosis: Other abnormalities of gait and mobility (R26.89);Muscle weakness (generalized) (M62.81)    Time: [id number] PT Time Calculation (min) (ACUTE ONLY): 17 min   Charges:  PT Evaluation $PT Eval Low Complexity: 1 Low        Kati PT, DPT Physical Therapist Acute Rehabilitation Services Preferred contact method: Secure Chat Weekend Pager Only: [phone] Office: [phone]   Janan Halter [place] 11/22/2021, 2:09 PM

## 2021-11-22 NOTE — Plan of Care (Signed)

## 2021-11-22 NOTE — Progress Notes (Signed)
PROGRESS NOTE    Kristen Ramos  XBJ:[id number] DOB: 30-Jun-1960 DOA: 11/19/2021  PCP: Jordan Hawks, PA-C    Brief Narrative:  This 61 years old female with PMH significant for rheumatoid arthritis on methotrexate, history of AVN, cyclic vomiting syndrome, obesity, recent admission for CVS in which she had seizure and PRES and chronic back pain who presented with vomiting and appeared to have confusion.  In the ER CT head was normal,  urine shows ketones, lipase normal.  ABG with mild alkalosis, electrolytes and renal function mostly normal.  Patient was admitted for further evaluation.  Patient started on IV fluids and Reglan for vomiting.  Altered mentation has significantly improved.  Assessment & Plan:   Principal Problem:   Cyclic vomiting syndrome Active Problems:   Accelerated hypertension   Rheumatoid arthritis (HCC)   Hypokalemia   GERD (gastroesophageal reflux disease)   Tobacco abuse   Hyperammonemia (HCC)   Acute metabolic encephalopathy   Obesity (BMI 30-39.9)  Cyclic vomiting syndrome: Patient presented again with similar symptoms ( Nausea , vomiting and abdominal pain) Continue IV fluids, Continue IV Reglan scheduled. Continue Zofran as needed. Continue pantoprazole 40 mg daily  Accelerated blood pressure: > Improving Patient does not take blood pressure medications at home. Blood pressure found to be systolic 160-180. Continue hydralazine as needed and labetalol as needed. Continue amlodipine 5 mg daily. Blood pressure is improving  Acute metabolic Encephalopathy: > Improving She was poorly responsive on arrival, seems to have responded. At baseline she is alert, independent , no cognitive impairment. No seizure-like activity noted. CT head without any abnormality. Patient following commands, slow in response.   Hyperammonemia: > Resolved Probably clinically insignificant.  She had transient hyperammonemia  9/11.   This is now completely resolved, she  does not have any symptoms of hepatic encephalopathy, no history of liver disease.   Tobacco abuse: Smoking cessation discussed with patient   GERD: Continue pantoprazole 40 mg daily  Hypokalemia: Replaced and resolved  Rheumatoid arthritis: During last hospitalization,  Neurology recommended titration of methotrexate Hold methotrexate.  Follow-up rheumatology outpatient  Obesity: Diet and exercise discussed in detail   DVT prophylaxis: Lovenox Code Status: Full code Family Communication: (Daughter at bedside Disposition Plan:    Status is: Inpatient Remains inpatient appropriate because: Admitted for recurrent cyclic vomiting syndrome requiring IV hydration. Found to have elevated blood pressure, started on blood pressure medication and is improving  Anticipated discharge home in 1 to 2 days.   Consultants:  None  Procedures: CT head Antimicrobials: None  Subjective: Patient was seen and examined at bedside.  Overnight events noted.   Patient is following commands.  She appears slightly confused but responds to questions appropriately most of the times.  Objective: Vitals:   11/20/21 2300 11/21/21 0356 11/21/21 1733 11/21/21 2010  BP: (!) 178/80 (!) 157/78 (!) 141/120 (!) 165/90  Pulse: 82 67 83 73  Resp:  18 18 20   Temp: 99.2 F (37.3 C) 99 F (37.2 C) 98.3 F (36.8 C) 99.1 F (37.3 C)  TempSrc: Axillary Oral    SpO2: 100% 100% 100% 100%  Weight:      Height:        Intake/Output Summary (Last 24 hours) at 11/22/2021 1209 Last data filed at 11/22/2021 11/24/2021 Gross per 24 hour  Intake 2957.1 ml  Output 600 ml  Net 2357.1 ml   Filed Weights   11/19/21 1819  Weight: 94.8 kg    Examination:  General exam: Appears comfortable, not  in any acute distress, deconditioned Respiratory system: CTA bilaterally, respiratory effort normal, no accessory muscle use. Cardiovascular system: S1 & S2 heard, regular rate and rhythm, no murmur. Gastrointestinal  system: Abdomen is soft, nontender, nondistended, BS+ Central nervous system: Alert and oriented x2 . No focal neurological deficits. Extremities: No edema, no cyanosis, no clubbing Skin: No rashes, lesions or ulcers Psychiatry: Judgement and insight appear normal. Mood & affect appropriate.     Data Reviewed: I have personally reviewed following labs and imaging studies  CBC: Recent Labs  Lab 11/19/21 1826 11/20/21 0806 11/20/21 1058 11/21/21 0454 11/22/21 0447  WBC 7.5  --  9.4 10.2 8.6  HGB 13.7 15.0 15.6* 16.4* 14.4  HCT 39.6 44.0 46.4* 51.9* 43.5  MCV 91.2  --  92.8 99.8 95.0  PLT 420*  --  478* 394 393   Basic Metabolic Panel: Recent Labs  Lab 11/19/21 1916 11/20/21 0806 11/20/21 1058 11/21/21 0454 11/22/21 0447  NA 138 141 141 143 137  K 3.7 3.5 3.3* 3.9 4.0  CL 102  --  107 113* 109  CO2 20*  --  21* 19* 19*  GLUCOSE 165*  --  130* 105* 97  BUN 9  --  10 10 9   CREATININE 0.75  --  0.67 0.70 0.60  CALCIUM 10.4*  --  9.7 9.1 8.9  MG  --   --  2.5*  --  2.0  PHOS  --   --  3.7  --   --    GFR: Estimated Creatinine Clearance: 87.9 mL/min (by C-G formula based on SCr of 0.6 mg/dL). Liver Function Tests: Recent Labs  Lab 11/19/21 1916 11/20/21 1058 11/21/21 0454 11/22/21 0447  AST 15 19 22 24   ALT 12 17 18 22   ALKPHOS 108 98 94 87  BILITOT 0.6 0.9 1.2 1.3*  PROT 9.1* 9.4* 8.4* 7.7  ALBUMIN 4.5 4.0 3.7 3.4*   Recent Labs  Lab 11/19/21 1916  LIPASE 10*   Recent Labs  Lab 11/20/21 0759 11/21/21 0454  AMMONIA 67* 20   Coagulation Profile: No results for input(s): "INR", "PROTIME" in the last 168 hours. Cardiac Enzymes: No results for input(s): "CKTOTAL", "CKMB", "CKMBINDEX", "TROPONINI" in the last 168 hours. BNP (last 3 results) No results for input(s): "PROBNP" in the last 8760 hours. HbA1C: No results for input(s): "HGBA1C" in the last 72 hours. CBG: Recent Labs  Lab 11/20/21 0924 11/20/21 1901 11/20/21 2358 11/21/21 0607  11/21/21 1208  GLUCAP 119* 121* 111* 95 107*   Lipid Profile: No results for input(s): "CHOL", "HDL", "LDLCALC", "TRIG", "CHOLHDL", "LDLDIRECT" in the last 72 hours. Thyroid Function Tests: No results for input(s): "TSH", "T4TOTAL", "FREET4", "T3FREE", "THYROIDAB" in the last 72 hours. Anemia Panel: No results for input(s): "VITAMINB12", "FOLATE", "FERRITIN", "TIBC", "IRON", "RETICCTPCT" in the last 72 hours. Sepsis Labs: No results for input(s): "PROCALCITON", "LATICACIDVEN" in the last 168 hours.  Recent Results (from the past 240 hour(s))  Resp Panel by RT-PCR (Flu A&B, Covid) Anterior Nasal Swab     Status: None   Collection Time: 11/18/21 12:56 AM   Specimen: Anterior Nasal Swab  Result Value Ref Range Status   SARS Coronavirus 2 by RT PCR NEGATIVE NEGATIVE Final    Comment: (NOTE) SARS-CoV-2 target nucleic acids are NOT DETECTED.  The SARS-CoV-2 RNA is generally detectable in upper respiratory specimens during the acute phase of infection. The lowest concentration of SARS-CoV-2 viral copies this assay can detect is 138 copies/mL. A negative result does not preclude  SARS-Cov-2 infection and should not be used as the sole basis for treatment or other patient management decisions. A negative result may occur with  improper specimen collection/handling, submission of specimen other than nasopharyngeal swab, presence of viral mutation(s) within the areas targeted by this assay, and inadequate number of viral copies(<138 copies/mL). A negative result must be combined with clinical observations, patient history, and epidemiological information. The expected result is Negative.  Fact Sheet for Patients:  EntrepreneurPulse.com.au  Fact Sheet for Healthcare Providers:  IncredibleEmployment.be  This test is no t yet approved or cleared by the [place] FDA and  has been authorized for detection and/or diagnosis of SARS-CoV-2 by FDA under an  Emergency Use Authorization (EUA). This EUA will remain  in effect (meaning this test can be used) for the duration of the COVID-19 declaration under Section 564(b)(1) of the Act, 21 U.S.C.section 360bbb-3(b)(1), unless the authorization is terminated  or revoked sooner.       Influenza A by PCR NEGATIVE NEGATIVE Final   Influenza B by PCR NEGATIVE NEGATIVE Final    Comment: (NOTE) The Xpert Xpress SARS-CoV-2/FLU/RSV plus assay is intended as an aid in the diagnosis of influenza from Nasopharyngeal swab specimens and should not be used as a sole basis for treatment. Nasal washings and aspirates are unacceptable for Xpert Xpress SARS-CoV-2/FLU/RSV testing.  Fact Sheet for Patients: EntrepreneurPulse.com.au  Fact Sheet for Healthcare Providers: IncredibleEmployment.be  This test is not yet approved or cleared by the [place] FDA and has been authorized for detection and/or diagnosis of SARS-CoV-2 by FDA under an Emergency Use Authorization (EUA). This EUA will remain in effect (meaning this test can be used) for the duration of the COVID-19 declaration under Section 564(b)(1) of the Act, 21 U.S.C. section 360bbb-3(b)(1), unless the authorization is terminated or revoked.  Performed at [facility], [place] [address] [place], [place] [address]     Radiology Studies: No results found.  Scheduled Meds:  amLODipine  5 mg Oral Daily   enoxaparin (LOVENOX) injection  40 mg Subcutaneous [id number]   folic acid  1 mg Oral Daily   hydrALAZINE  5 mg Intravenous Once   metoCLOPramide (REGLAN) injection  10 mg Intravenous Q6H   ondansetron (ZOFRAN) IV  4 mg Intravenous Once   pantoprazole  40 mg Oral Daily   Continuous Infusions:  0.9 % NaCl with KCl 20 mEq / L 125 mL/hr at 11/22/21 0757     LOS: 2 days    Time spent: 50 mins    Edel Rivero, MD [facility]   If 7PM-7AM, please contact night-coverage

## 2021-11-23 ENCOUNTER — Inpatient Hospital Stay (HOSPITAL_COMMUNITY): Payer: Medicare (Managed Care)

## 2021-11-23 DIAGNOSIS — I6783 Posterior reversible encephalopathy syndrome: Secondary | ICD-10-CM | POA: Diagnosis not present

## 2021-11-23 DIAGNOSIS — R569 Unspecified convulsions: Secondary | ICD-10-CM | POA: Diagnosis not present

## 2021-11-23 DIAGNOSIS — R1115 Cyclical vomiting syndrome unrelated to migraine: Secondary | ICD-10-CM | POA: Diagnosis not present

## 2021-11-23 LAB — GLUCOSE, CAPILLARY
Glucose-Capillary: 106 mg/dL — ABNORMAL HIGH (ref 70–99)
Glucose-Capillary: 122 mg/dL — ABNORMAL HIGH (ref 70–99)
Glucose-Capillary: 88 mg/dL (ref 70–99)
Glucose-Capillary: 98 mg/dL (ref 70–99)
Glucose-Capillary: 99 mg/dL (ref 70–99)

## 2021-11-23 MED ORDER — LORAZEPAM 2 MG/ML IJ SOLN
1.0000 mg | INTRAMUSCULAR | Status: DC | PRN
  Administered 2021-11-23: 1 mg via INTRAVENOUS
  Filled 2021-11-23: qty 1

## 2021-11-23 MED ORDER — SODIUM CHLORIDE 0.9 % IV SOLN
2000.0000 mg | Freq: Once | INTRAVENOUS | Status: AC
  Administered 2021-11-23: 2000 mg via INTRAVENOUS
  Filled 2021-11-23: qty 20

## 2021-11-23 MED ORDER — GADOBUTROL 1 MMOL/ML IV SOLN
9.5000 mL | Freq: Once | INTRAVENOUS | Status: AC | PRN
  Administered 2021-11-23: 9.5 mL via INTRAVENOUS

## 2021-11-23 MED ORDER — LEVETIRACETAM IN NACL 1000 MG/100ML IV SOLN
1000.0000 mg | Freq: Two times a day (BID) | INTRAVENOUS | Status: DC
  Administered 2021-11-24 – 2021-11-25 (×3): 1000 mg via INTRAVENOUS
  Filled 2021-11-23 (×3): qty 100

## 2021-11-23 MED ORDER — AMLODIPINE BESYLATE 10 MG PO TABS
10.0000 mg | ORAL_TABLET | Freq: Every day | ORAL | Status: DC
  Administered 2021-11-24 – 2021-11-27 (×4): 10 mg via ORAL
  Filled 2021-11-23 (×4): qty 1

## 2021-11-23 NOTE — Care Management Important Message (Signed)
Important Message  Patient Details IM Letter placed in Patients room for Daughter Name: Americus Scheurich MRN: [id number] Date of Birth: [birth year]   Medicare Important Message Given:  Yes     Caren Macadam 11/23/2021, 10:35 AM

## 2021-11-23 NOTE — Progress Notes (Signed)
   11/23/21 1110  Assess: MEWS Score  Temp 99 F (37.2 C)  BP (!) 180/105  MAP (mmHg) 124  Pulse Rate (!) 147  Assess: MEWS Score  MEWS Temp 0  MEWS Systolic 0  MEWS Pulse 3  MEWS RR 0  MEWS LOC 0  MEWS Score 3  MEWS Score Color Yellow  Assess: if the MEWS score is Yellow or Red  Were vital signs taken at a resting state? No  Focused Assessment Change from prior assessment (see assessment flowsheet)  Does the patient meet 2 or more of the SIRS criteria? Yes  Does the patient have a confirmed or suspected source of infection? No  MEWS guidelines implemented *See Row Information* Yes  Treat  MEWS Interventions Other (Comment) (MD paged and came to bedside as well as CN and AC at the bedside)  Take Vital Signs  Increase Vital Sign Frequency  Yellow: Q 2hr X 2 then Q 4hr X 2, if remains yellow, continue Q 4hrs  Notify: Charge Nurse/RN  Name of Charge Nurse/RN Notified Lovette Cliche, RN  Date Charge Nurse/RN Notified 11/19/21  Time Charge Nurse/RN Notified 1110  Notify: Provider  Provider Name/Title Lucianne Muss  Date Provider Notified 11/23/21  Time Provider Notified 1115  Method of Notification Page;Face-to-face  Notification Reason Change in status;Other (Comment) (Patient had a seizure)  Provider response At bedside;See new orders  Date of Provider Response 11/20/21  Time of Provider Response 1116  Document  Patient Outcome Transferred/level of care increased;Other (Comment) (Patient awaiting to be transferred to [facility]  Progress note created (see row info) Yes  Assess: SIRS CRITERIA  SIRS Temperature  0  SIRS Pulse 1  SIRS Respirations  0  SIRS WBC 1  SIRS Score Sum  2   Upon responding to the duress alarm, patient found to be seizing in the bed at the times from 1109-1110. Seizure/tonic-clonic activity lasted about a minute. CN, AC, nurse tech and other staff nurses at the bedside assisting to place patient on their side, set up suction, and pad the railings. Patient's vitals  signs during activity listed above. Patient was alert but unable to follow commands. Attending Lucianne Muss MD arrived to the bedside post seizure activity and once patient was more alert and back to baseline. MD placed new orders to consult neurology and transfer patient to [facility]. Patient currently stable and back to baseline; AOx1-self and vital signs are back to within defined limits.

## 2021-11-23 NOTE — Plan of Care (Signed)

## 2021-11-23 NOTE — Consult Note (Signed)
Neurology Consultation  Reason for Consult: Seizure Referring Physician: Lucianne Muss  CC: Altered mental status, seizure  History is obtained from: Chart review  HPI: Kristen Ramos is a 61 y.o. female with a past medical history medical history significant for cyclic vomiting syndrome, allergic rhinitis, osteoarthritis, chronic back pain, GERD, rheumatoid arthritis, PRES, who presented to the emergency department on 11/19/2021 via EMS due to abdominal pain, nausea and vomiting.  During last admission patient was noted to have seizure activity on 8/15.  Follow-up EEG did show periodic discharges with triphasic morphology.  She was loaded with 1500 mg of Keppra and then started on 500 mg twice daily.  Prior to that she did not have a history of seizures, head trauma, falls, or headaches.  She was discharged on Keppra 500 mg twice daily per discharge summary.  On 9/11 patient presented to ED for vomiting and agitation.  Her daughter told the EDP that she has been under more stress and had not taken her Reglan.  On 912 she was noted to be oriented and following commands. 9/14 patient had a seizure witnessed by nursing.  Neurology was consulted and we have recommended transfer to [facility] for continuous EEG.  Patient appears postictal and she is not able to state her name or follow commands.  She is restless in the bed but moving all extremities right side more than left. She does not currently have any AEDs ordered.   ROS: Unable to obtain due to altered mental status.   Past Medical History:  Diagnosis Date   Allergic rhinitis    Arthritis    Chronic back pain    GERD (gastroesophageal reflux disease)    RA (rheumatoid arthritis) (HCC)     No family history on file.  Social History:   reports that she has been smoking cigarettes. She has never used smokeless tobacco. She reports that she does not drink alcohol and does not use drugs.  Medications  Current Facility-Administered  Medications:    0.9 % NaCl with KCl 20 mEq/ L  infusion, , Intravenous, Continuous, Bobette Mo, MD, Last Rate: 125 mL/hr at 11/23/21 0821, New Bag at 11/23/21 0821   acetaminophen (TYLENOL) tablet 650 mg, 650 mg, Oral, Q6H PRN, 650 mg at 11/22/21 1322 **OR** acetaminophen (TYLENOL) suppository 650 mg, 650 mg, Rectal, Q6H PRN, Bobette Mo, MD   [START ON 11/24/2021] amLODipine (NORVASC) tablet 10 mg, 10 mg, Oral, Daily, Cipriano Bunker, MD   enoxaparin (LOVENOX) injection 40 mg, 40 mg, Subcutaneous, Q24H, Danford, Earl Lites, MD, 40 mg at 11/23/21 1610   folic acid (FOLVITE) tablet 1 mg, 1 mg, Oral, Daily, Danford, Earl Lites, MD, 1 mg at 11/23/21 0817   hydrALAZINE (APRESOLINE) injection 15 mg, 15 mg, Intravenous, Q4H PRN, Bobette Mo, MD   hydrALAZINE (APRESOLINE) injection 5 mg, 5 mg, Intravenous, Once, Horton, Mayer Masker, MD   labetalol (NORMODYNE) injection 10 mg, 10 mg, Intravenous, Q6H PRN, Danford, Earl Lites, MD   meclizine (ANTIVERT) tablet 25 mg, 25 mg, Oral, TID PRN, Alberteen Sam, MD, 25 mg at 11/21/21 1003   metoCLOPramide (REGLAN) injection 10 mg, 10 mg, Intravenous, Q6H, Danford, Earl Lites, MD, 10 mg at 11/23/21 1141   ondansetron (ZOFRAN) injection 4 mg, 4 mg, Intravenous, Once, Horton, Mayer Masker, MD   ondansetron (ZOFRAN) tablet 4 mg, 4 mg, Oral, Q6H PRN **OR** ondansetron (ZOFRAN) injection 4 mg, 4 mg, Intravenous, Q6H PRN, Bobette Mo, MD, 4 mg at 11/21/21 [phone]  oxyCODONE (Oxy IR/ROXICODONE) immediate release tablet 15 mg, 15 mg, Oral, QID PRN, Danford, Earl Lites, MD   pantoprazole (PROTONIX) EC tablet 40 mg, 40 mg, Oral, Daily, Danford, Earl Lites, MD, 40 mg at 11/23/21 0817   Exam: Current vital signs: BP 129/84   Pulse (!) 114   Temp 99 F (37.2 C)   Resp 18   Ht 5\' 6"  (1.676 m)   Wt 94.8 kg   SpO2 100%   BMI 33.73 kg/m  Vital signs in last 24 hours: Temp:  [98.8 F (37.1 C)-99.1 F (37.3 C)] 99 F  (37.2 C) (09/14 1115) Pulse Rate:  [74-147] 114 (09/14 1120) Resp:  [18-21] 18 (09/14 0519) BP: (129-180)/(84-108) 129/84 (09/14 1120) SpO2:  [100 %] 100 % (09/14 0519)  GENERAL: Awake, restless HEENT: - Normocephalic and atraumatic, dry mm, no LN++, no Thyromegally LUNGS - Clear to auscultation bilaterally with no wheezes CV - S1S2 RRR, no m/r/g, equal pulses bilaterally. ABDOMEN - Soft, nontender, nondistended with normoactive BS Ext: warm, well perfused, intact peripheral pulses, no edema  NEURO:  Mental Status: awake, does not answer questions. Will track examiner in the room Language: speech is unintelligible. Cranial Nerves: PERRL 69mm/brisk. EOMI, visual fields full, no facial asymmetry, facial sensation intact, hearing intact, tongue/uvula/soft palate midline, normal sternocleidomastoid and trapezius muscle strength. No evidence of tongue atrophy or fibrillations Motor: moving all extremities spontaneously, appears to be moving right side more than left Tone: is normal and bulk is normal Sensation- Intact to light touch bilaterally Gait- deferred   Labs I have reviewed labs in epic and the results pertinent to this consultation are:   CBC    Component Value Date/Time   WBC 8.6 11/22/2021 0447   RBC 4.58 11/22/2021 0447   HGB 14.4 11/22/2021 0447   HCT 43.5 11/22/2021 0447   PLT 393 11/22/2021 0447   MCV 95.0 11/22/2021 0447   MCH 31.4 11/22/2021 0447   MCHC 33.1 11/22/2021 0447   RDW 13.4 11/22/2021 0447   LYMPHSABS 1.1 10/20/2021 1128   MONOABS 0.3 10/20/2021 1128   EOSABS 0.0 10/20/2021 1128   BASOSABS 0.0 10/20/2021 1128    CMP     Component Value Date/Time   NA 137 11/22/2021 0447   K 4.0 11/22/2021 0447   CL 109 11/22/2021 0447   CO2 19 (L) 11/22/2021 0447   GLUCOSE 97 11/22/2021 0447   BUN 9 11/22/2021 0447   CREATININE 0.60 11/22/2021 0447   CALCIUM 8.9 11/22/2021 0447   PROT 7.7 11/22/2021 0447   ALBUMIN 3.4 (L) 11/22/2021 0447   AST 24  11/22/2021 0447   ALT 22 11/22/2021 0447   ALKPHOS 87 11/22/2021 0447   BILITOT 1.3 (H) 11/22/2021 0447   GFRNONAA >60 11/22/2021 0447   GFRAA >60 06/22/2019 0524    Lipid Panel     Component Value Date/Time   CHOL 259 (H) 07/16/2018 0234   TRIG 70 07/16/2018 0234   HDL 60 07/16/2018 0234   CHOLHDL 4.3 07/16/2018 0234   VLDL 14 07/16/2018 0234   LDLCALC 185 (H) 07/16/2018 0234     Imaging I have reviewed the images obtained:  CT-head- negative head CT MRI brain  Assessment:  61 y.o. female with a past medical history medical history significant for cyclic vomiting syndrome, allergic rhinitis, osteoarthritis, chronic back pain, GERD, rheumatoid arthritis, PRES, who presented to the emergency department on 11/19/2021 via EMS due to abdominal pain, nausea and vomiting. Patient had witnessed GTC seizure today, concern for subclinical  seizures given patient's persistent encephalopathy and recent admission for PRES.  Recommendations: -Transferred to [facility] for continuous EEG - Stat MRI brain wwo given L sided weakness - Maintain seizure precautions - Maintain airway precautions -Notify provider for any seizure activity or change in neuro status - 2g IV keppra load to be f/b 1000mg  keppra IV q 12 hrs  Patient seen and examined by NP/APP with MD. MD to update note as needed.   , DNP, FNP-BC [facility] Pager: [phone]  Attending Neurologist's note:   I personally saw this patient, gathering history, performing a full neurologic examination, reviewing relevant labs, personally reviewing relevant imaging, and formulated the assessment and plan, adding the note above for completeness and clarity to accurately reflect my findings and recommendations. MRI brain personally reviewed, truncated exam 2/2 cooperation and also motion degraded, no clear e/o infarct although findings c/w PRES have progressed since last admission.   [phone], MD [facility] [phone]  If 7pm- 7am, please page neurology on call as listed in AMION.

## 2021-11-23 NOTE — Progress Notes (Signed)
PROGRESS NOTE    Kristen Ramos  EPP:[id number] DOB: [birth year] DOA: 11/19/2021  PCP: Jordan Hawks, PA-C    Brief Narrative:  This 61 years old female with PMH significant for rheumatoid arthritis on methotrexate, history of AVN, cyclic vomiting syndrome, obesity, recent admission for CVS in which she had seizure and PRES and chronic back pain who presented with vomiting and appeared to have confusion.  In the ER CT head was normal,  urine shows ketones, lipase normal.  ABG with mild alkalosis, electrolytes and renal function mostly normal.  Patient was admitted for further evaluation.  Patient started on IV fluids and Reglan for vomiting.  Altered mentation has significantly improved.  Patient has developed generalized tonic-clonic seizures with frothing at the mouth, lasted 1 minute.  Patient remained postictal.  Neurology consulted recommended transfer to [facility] for continuous EEG.  Assessment & Plan:   Principal Problem:   Cyclic vomiting syndrome Active Problems:   Accelerated hypertension   Rheumatoid arthritis (HCC)   Hypokalemia   GERD (gastroesophageal reflux disease)   Tobacco abuse   Hyperammonemia (HCC)   Acute metabolic encephalopathy   Obesity (BMI 30-39.9)  Cyclic vomiting syndrome: Patient presented again with similar symptoms ( Nausea , vomiting and abdominal pain) Continue IV fluids, Continue IV Reglan scheduled. Continue Zofran as needed. Continue pantoprazole 40 mg daily.  Accelerated blood pressure: > Improving Patient does not take blood pressure medications at home. Blood pressure found to be systolic 160-180. Continue hydralazine as needed and labetalol as needed. Increased amlodipine 5 > 10 mg daily. Blood pressure is improving  Acute metabolic Encephalopathy: > She was poorly responsive on arrival, seems to have improved. At baseline she is alert, independent , no cognitive impairment. No seizure-like activity was noted. CT head without any  abnormality. Patient following commands, slow in response.  9/14: Patient had generalized tonic-clonic seizures with frothing in the mouth.  Remains postictal Neurology is consulted,  recommended transfer to [facility] for continuous EEG.  New onset seizures: Patient had developed generalized tonic-clonic seizures with frothing in the mouth.   She remained postictal afterwards.   Continue Ativan 1 mg IV as needed for seizures. Neurology is consulted recommended transfer to [facility] for continuous EEG. Follow-up formal neurology consult.   Hyperammonemia: > Resolved Probably clinically insignificant.  She had transient hyperammonemia  9/11.   This is now completely resolved, she does not have any symptoms of hepatic encephalopathy, no history of liver disease.   Tobacco abuse: Smoking cessation discussed with patient and daughter.   GERD: Continue pantoprazole 40 mg daily.  Hypokalemia: Replaced and resolved.  Rheumatoid arthritis: During last hospitalization,  Neurology recommended titration of methotrexate Hold methotrexate.  Follow-up rheumatology outpatient  Obesity: Diet and exercise discussed in detail.   DVT prophylaxis: Lovenox Code Status: Full code Family Communication: (Daughter at bedside Disposition Plan:    Status is: Inpatient Remains inpatient appropriate because: Admitted for recurrent cyclic vomiting syndrome requiring IV hydration. Found to have elevated blood pressure, started on blood pressure medication and is improving  Anticipated discharge home in 1 to 2 days.   Consultants:  None  Procedures: CT head Antimicrobials: None  Subjective: Patient was seen and examined at bedside.  Overnight events noted.   Patient was still confused but following full commands appropriately in the morning. Later in the day patient has developed generalized tonic-clonic seizures with frothing in the mouth.  Remained postictal. Seizure lasted less than 1  minute and stopped spontaneously.  Objective:  Vitals:   11/23/21 1100 11/23/21 1110 11/23/21 1115 11/23/21 1120  BP: (!) 158/84 (!) 180/105 (!) 168/89 129/84  Pulse: 96 (!) 147 (!) 124 (!) 114  Resp:      Temp: 99 F (37.2 C) 99 F (37.2 C) 99 F (37.2 C)   TempSrc: Oral Oral    SpO2:      Weight:      Height:        Intake/Output Summary (Last 24 hours) at 11/23/2021 1146 Last data filed at 11/23/2021 1058 Gross per 24 hour  Intake 360 ml  Output 3200 ml  Net -2840 ml   Filed Weights   11/19/21 1819  Weight: 94.8 kg    Examination:  General exam: Appears comfortable, not in any acute distress, deconditioned. Respiratory system: CTA bilaterally, respiratory effort normal, no accessory muscle use. Cardiovascular system: S1 & S2 heard, regular rate and rhythm, no murmur. Gastrointestinal system: Abdomen is soft, non tender, non distended, BS+ Central nervous system: Remain confused after seizure. No focal neurological deficits. Extremities: No edema, no cyanosis, no clubbing Skin: No rashes, lesions or ulcers Psychiatry: Mood and affect appropriate.    Data Reviewed: I have personally reviewed following labs and imaging studies  CBC: Recent Labs  Lab 11/19/21 1826 11/20/21 0806 11/20/21 1058 11/21/21 0454 11/22/21 0447  WBC 7.5  --  9.4 10.2 8.6  HGB 13.7 15.0 15.6* 16.4* 14.4  HCT 39.6 44.0 46.4* 51.9* 43.5  MCV 91.2  --  92.8 99.8 95.0  PLT 420*  --  478* 394 393   Basic Metabolic Panel: Recent Labs  Lab 11/19/21 1916 11/20/21 0806 11/20/21 1058 11/21/21 0454 11/22/21 0447  NA 138 141 141 143 137  K 3.7 3.5 3.3* 3.9 4.0  CL 102  --  107 113* 109  CO2 20*  --  21* 19* 19*  GLUCOSE 165*  --  130* 105* 97  BUN 9  --  10 10 9   CREATININE 0.75  --  0.67 0.70 0.60  CALCIUM 10.4*  --  9.7 9.1 8.9  MG  --   --  2.5*  --  2.0  PHOS  --   --  3.7  --   --    GFR: Estimated Creatinine Clearance: 87.9 mL/min (by C-G formula based on SCr of 0.6  mg/dL). Liver Function Tests: Recent Labs  Lab 11/19/21 1916 11/20/21 1058 11/21/21 0454 11/22/21 0447  AST 15 19 22 24   ALT 12 17 18 22   ALKPHOS 108 98 94 87  BILITOT 0.6 0.9 1.2 1.3*  PROT 9.1* 9.4* 8.4* 7.7  ALBUMIN 4.5 4.0 3.7 3.4*   Recent Labs  Lab 11/19/21 1916  LIPASE 10*   Recent Labs  Lab 11/20/21 0759 11/21/21 0454  AMMONIA 67* 20   Coagulation Profile: No results for input(s): "INR", "PROTIME" in the last 168 hours. Cardiac Enzymes: No results for input(s): "CKTOTAL", "CKMB", "CKMBINDEX", "TROPONINI" in the last 168 hours. BNP (last 3 results) No results for input(s): "PROBNP" in the last 8760 hours. HbA1C: No results for input(s): "HGBA1C" in the last 72 hours. CBG: Recent Labs  Lab 11/21/21 1208 11/22/21 1237 11/22/21 1836 11/23/21 0012 11/23/21 0601  GLUCAP 107* 87 116* 99 106*   Lipid Profile: No results for input(s): "CHOL", "HDL", "LDLCALC", "TRIG", "CHOLHDL", "LDLDIRECT" in the last 72 hours. Thyroid Function Tests: No results for input(s): "TSH", "T4TOTAL", "FREET4", "T3FREE", "THYROIDAB" in the last 72 hours. Anemia Panel: No results for input(s): "VITAMINB12", "FOLATE", "FERRITIN", "TIBC", "IRON", "  RETICCTPCT" in the last 72 hours. Sepsis Labs: No results for input(s): "PROCALCITON", "LATICACIDVEN" in the last 168 hours.  Recent Results (from the past 240 hour(s))  Resp Panel by RT-PCR (Flu A&B, Covid) Anterior Nasal Swab     Status: None   Collection Time: 11/18/21 12:56 AM   Specimen: Anterior Nasal Swab  Result Value Ref Range Status   SARS Coronavirus 2 by RT PCR NEGATIVE NEGATIVE Final    Comment: (NOTE) SARS-CoV-2 target nucleic acids are NOT DETECTED.  The SARS-CoV-2 RNA is generally detectable in upper respiratory specimens during the acute phase of infection. The lowest concentration of SARS-CoV-2 viral copies this assay can detect is 138 copies/mL. A negative result does not preclude SARS-Cov-2 infection and should not  be used as the sole basis for treatment or other patient management decisions. A negative result may occur with  improper specimen collection/handling, submission of specimen other than nasopharyngeal swab, presence of viral mutation(s) within the areas targeted by this assay, and inadequate number of viral copies(<138 copies/mL). A negative result must be combined with clinical observations, patient history, and epidemiological information. The expected result is Negative.  Fact Sheet for Patients:  BloggerCourse.com  Fact Sheet for Healthcare Providers:  SeriousBroker.it  This test is no t yet approved or cleared by the [place] FDA and  has been authorized for detection and/or diagnosis of SARS-CoV-2 by FDA under an Emergency Use Authorization (EUA). This EUA will remain  in effect (meaning this test can be used) for the duration of the COVID-19 declaration under Section 564(b)(1) of the Act, 21 U.S.C.section 360bbb-3(b)(1), unless the authorization is terminated  or revoked sooner.       Influenza A by PCR NEGATIVE NEGATIVE Final   Influenza B by PCR NEGATIVE NEGATIVE Final    Comment: (NOTE) The Xpert Xpress SARS-CoV-2/FLU/RSV plus assay is intended as an aid in the diagnosis of influenza from Nasopharyngeal swab specimens and should not be used as a sole basis for treatment. Nasal washings and aspirates are unacceptable for Xpert Xpress SARS-CoV-2/FLU/RSV testing.  Fact Sheet for Patients: BloggerCourse.com  Fact Sheet for Healthcare Providers: SeriousBroker.it  This test is not yet approved or cleared by the [place] FDA and has been authorized for detection and/or diagnosis of SARS-CoV-2 by FDA under an Emergency Use Authorization (EUA). This EUA will remain in effect (meaning this test can be used) for the duration of the COVID-19 declaration under Section  564(b)(1) of the Act, 21 U.S.C. section 360bbb-3(b)(1), unless the authorization is terminated or revoked.  Performed at [facility], [address], [place], [place] [address]     Radiology Studies: No results found.  Scheduled Meds:  [START ON 11/24/2021] amLODipine  10 mg Oral Daily   enoxaparin (LOVENOX) injection  40 mg Subcutaneous Q24H   folic acid  1 mg Oral Daily   hydrALAZINE  5 mg Intravenous Once   metoCLOPramide (REGLAN) injection  10 mg Intravenous Q6H   ondansetron (ZOFRAN) IV  4 mg Intravenous Once   pantoprazole  40 mg Oral Daily   Continuous Infusions:  0.9 % NaCl with KCl 20 mEq / L 125 mL/hr at 11/23/21 0821     LOS: 3 days    Time spent: 35 mins    Analeah Brame, MD [facility]   If 7PM-7AM, please contact night-coverage

## 2021-11-23 NOTE — Progress Notes (Signed)
LTM EEG hooked up and running - no initial skin breakdown - push button tested - neuro notified. [facility] monitoring.  

## 2021-11-23 NOTE — Progress Notes (Signed)
Attempted to call and give report to [facility] nurse 2 times and not able to. This day shift RN passed on to night shift RN to call report to [facility]. [facility] is set up and in route to getting the patient transferred.

## 2021-11-23 NOTE — Progress Notes (Signed)
Report called to [facility], Report was given to [facility], RN @ 20:50. Pt leaving [facility] now with [facility]. Family is aware of where pt is transferring and will follow up with new care team tomorrow.

## 2021-11-24 DIAGNOSIS — I10 Essential (primary) hypertension: Secondary | ICD-10-CM | POA: Diagnosis not present

## 2021-11-24 DIAGNOSIS — G9341 Metabolic encephalopathy: Secondary | ICD-10-CM | POA: Diagnosis not present

## 2021-11-24 DIAGNOSIS — G40909 Epilepsy, unspecified, not intractable, without status epilepticus: Secondary | ICD-10-CM

## 2021-11-24 DIAGNOSIS — I6783 Posterior reversible encephalopathy syndrome: Secondary | ICD-10-CM | POA: Diagnosis not present

## 2021-11-24 DIAGNOSIS — R569 Unspecified convulsions: Secondary | ICD-10-CM | POA: Diagnosis not present

## 2021-11-24 DIAGNOSIS — R1115 Cyclical vomiting syndrome unrelated to migraine: Secondary | ICD-10-CM | POA: Diagnosis not present

## 2021-11-24 LAB — GLUCOSE, CAPILLARY
Glucose-Capillary: 102 mg/dL — ABNORMAL HIGH (ref 70–99)
Glucose-Capillary: 81 mg/dL (ref 70–99)
Glucose-Capillary: 83 mg/dL (ref 70–99)
Glucose-Capillary: 88 mg/dL (ref 70–99)
Glucose-Capillary: 94 mg/dL (ref 70–99)
Glucose-Capillary: 94 mg/dL (ref 70–99)
Glucose-Capillary: 99 mg/dL (ref 70–99)

## 2021-11-24 NOTE — Progress Notes (Signed)
LTM maint complete - no skin breakdown under: REF,TA,CZ. Test event button.

## 2021-11-24 NOTE — Progress Notes (Signed)
Neurology progress note  S: Patient is much more alert and conversant today. Oriented x3. No new neurologic complaints.  LTM EEG overnight: moderate diffuse slowing  O:  Vitals:   11/24/21 1512 11/24/21 1956  BP: (!) 146/84 129/73  Pulse: 72 74  Resp: 17 16  Temp: 98.2 F (36.8 C) 97.7 F (36.5 C)  SpO2: 100% 100%   A&Ox3, NAD Resp: CTAB CV: RRR  Neurologic exam MS: A&Ox3, follows simple commands Speech: mild dysarthria, able to name and repeat CN: PERRL, VFF, EOMI, sensation intact, face symmetric at rest, hearing intact to voice Motor: 4/5 diffusely Sensory: SILT Reflexes: 2+ symm throughout, toes down Coordination: FNF intact bilat Gait: deferred  MRI brain 0/14/23 personally reviewed, truncated exam 2/2 cooperation and also motion degraded, no clear e/o infarct although findings c/w PRES have progressed since last admission.   A/P: 61 y.o. female with a past medical history medical history significant for cyclic vomiting syndrome, allergic rhinitis, osteoarthritis, chronic back pain, GERD, rheumatoid arthritis, PRES, who presented to the emergency department on 11/19/2021 via EMS due to abdominal pain, nausea and vomiting. Patient had witnessed GTC seizure 9/14 and [facility] 2/2 concern for subclinical seizures given patient's persistent encephalopathy and recent dx of PRES.  Since starting keppra yesterday prior to transfer she has improved significantly. Upon further chart review it appears that keppra was discontinued as an outpatient 2/2 lethargy and mild rash but she is tolerating it here without side effects and with apparent resolution of subclinical seizures so will continue it for now. She can likely be slowly weaned off as o/p after 3 mos if seizure free.   Recommendations: - Continue LTM EEG overnight, can unhook tomorrow if no seizures overnight - Maintain seizure precautions - Maintain airway precautions -Notify provider for any seizure  activity or change in neuro status - Continue 1000mg  keppra IV q 12 hrs  Will continue to follow.  , MD [facility] [phone]  If 7pm- 7am, please page neurology on call as listed in AMION.

## 2021-11-24 NOTE — TOC Initial Note (Signed)
Transition of Care [facility]) - Initial/Assessment Note    Patient Details  Name: Kristen Ramos MRN: [id number] Date of Birth: [birth year]  Transition of Care [facility]) CM/SW Contact:    Kermit Balo, RN Phone Number: 11/24/2021, 3:00 PM  Clinical Narrative:                 Patient continues with some lethargy and confusion. CM called and spoke to patients daughter over the phone. She relates that patient lives with her spouse and niece (14yo).  Pt was totally independent in ADL's. She was driving, managing her own medications.  She does not have 24/7 supervision. Her spouse works: T,W,Th, Sat. Daughter has planned to take some days off next week to hopefully transition her home.  Will see how patient does after EEG d/ced with therapies to see if recommendations stay the same. Current recommendations are for [facility] services and daughter in agreement. She will also need a walker at d/c if not improved when therapy re-sees.  TOC following.  Expected Discharge Plan: Home w Home Health Services Barriers to Discharge: Continued Medical Work up   Patient Goals and CMS Choice   CMS Medicare.gov Compare Post Acute Care list provided to:: Patient Represenative (must comment) Choice offered to / list presented to : Adult Children  Expected Discharge Plan and Services Expected Discharge Plan: Home w Home Health Services   Discharge Planning Services: CM Consult Post Acute Care Choice: Home Health, Durable Medical Equipment Living arrangements for the past 2 months: Single Family Home                                      Prior Living Arrangements/Services Living arrangements for the past 2 months: Single Family Home Lives with:: Spouse, Minor Children Patient language and need for interpreter reviewed:: Yes Do you feel safe going back to the place where you live?: Yes            Criminal Activity/Legal Involvement Pertinent to Current Situation/Hospitalization: No - Comment as  needed  Activities of Daily Living Home Assistive Devices/Equipment: None ADL Screening (condition at time of admission) Patient's cognitive ability adequate to safely complete daily activities?: No Is the patient deaf or have difficulty hearing?: No Does the patient have difficulty seeing, even when wearing glasses/contacts?: No Does the patient have difficulty concentrating, remembering, or making decisions?: No Patient able to express need for assistance with ADLs?: Yes Does the patient have difficulty dressing or bathing?: No Independently performs ADLs?: Yes (appropriate for developmental age) (typically, not now) Does the patient have difficulty walking or climbing stairs?: No Weakness of Legs: Both Weakness of Arms/Hands: None  Permission Sought/Granted                  Emotional Assessment Appearance:: Appears stated age         Psych Involvement: No (comment)  Admission diagnosis:  Encephalopathy [G93.40] Cyclic vomiting syndrome [R11.15] AMS (altered mental status) [R41.82] Patient Active Problem List   Diagnosis Date Noted   Obesity (BMI 30-39.9) 11/21/2021   Accelerated hypertension 11/21/2021   Hyperammonemia (HCC) 11/20/2021   Acute metabolic encephalopathy 11/20/2021   Cyclic vomiting syndrome    AKI (acute kidney injury) (HCC) 03/25/2021   Allergic rhinitis    GERD (gastroesophageal reflux disease)    Tobacco abuse    Intractable cyclical vomiting 06/16/2019   Chronic back pain    Hypokalemia  Impaired fasting blood sugar    Abdominal pain 04/15/2018   Nausea & vomiting 04/15/2018   Rheumatoid arthritis (HCC) 02/12/2018   Epigastric abdominal pain 02/12/2018   PCP:  Jordan Hawks, PA-C Pharmacy:   [id number] - [place], [place] - [address] AT [facility] OF [facility] & [address] [place] [address] Phone: [phone] Fax: [phone]  [facility] #[id number] [place], [place] - [address] AT [facility] OF [address] & [address] [place] [address] Phone: [phone] Fax: [phone]     Social Determinants of Health (SDOH) Interventions    Readmission Risk Interventions     No data to display

## 2021-11-24 NOTE — Plan of Care (Signed)
During shift, alertness increased - arousable to voice but still lethargic/fatigued, unable to answer questions but verbal.   Problem: Education: Goal: Knowledge of General Education information will improve Description: Including pain rating scale, medication(s)/side effects and non-pharmacologic comfort measures Outcome: Progressing   Problem: Health Behavior/Discharge Planning: Goal: Ability to manage health-related needs will improve Outcome: Progressing   Problem: Clinical Measurements: Goal: Ability to maintain clinical measurements within normal limits will improve Outcome: Progressing Goal: Will remain free from infection Outcome: Progressing Goal: Diagnostic test results will improve Outcome: Progressing Goal: Respiratory complications will improve Outcome: Progressing Goal: Cardiovascular complication will be avoided Outcome: Progressing   Problem: Activity: Goal: Risk for activity intolerance will decrease Outcome: Progressing   Problem: Nutrition: Goal: Adequate nutrition will be maintained Outcome: Progressing   Problem: Coping: Goal: Level of anxiety will decrease Outcome: Progressing   Problem: Elimination: Goal: Will not experience complications related to bowel motility Outcome: Progressing Goal: Will not experience complications related to urinary retention Outcome: Progressing   Problem: Pain Managment: Goal: General experience of comfort will improve Outcome: Progressing   Problem: Safety: Goal: Ability to remain free from injury will improve Outcome: Progressing   Problem: Skin Integrity: Goal: Risk for impaired skin integrity will decrease Outcome: Progressing

## 2021-11-24 NOTE — Progress Notes (Signed)
Physical Therapy Treatment Patient Details Name: Kristen Ramos MRN: [id number] DOB: [birth year] Today's Date: 11/24/2021   History of Present Illness 61 years old female with PMH significant for rheumatoid arthritis on methotrexate, history of AVN, cyclic vomiting syndrome, obesity, recent admission for [facility] in which she had seizure and PRES and chronic back pain who presented with vomiting and appeared to have confusion.    PT Comments    Patient needing increased time for all mobility and to initiate movement following commands.  She was able to assist with mobility and improved during session with sit to stand from max to mod A, but was unable to progress to taking steps toward [facility].  She was unable to utilize RW as previous arthritic deformities and weakness prevented much grip with her hands.  She balanced well on EOB and verbalized month and year and that she was in the hospital, but was unable to state name of hospital.  Seems to have declined some since last PT session as was min A for sit to stand and able to transition to recliner.  She may benefit from follow up acute inpatient rehab prior to d/c home.  She will need 24/7 assist at d/c.  PT will continue to follow acutely.    Recommendations for follow up therapy are one component of a multi-disciplinary discharge planning process, led by the attending physician.  Recommendations may be updated based on patient status, additional functional criteria and insurance authorization.  Follow Up Recommendations  Acute inpatient rehab (3hours/day) Can patient physically be transported by private vehicle: No   Assistance Recommended at Discharge Frequent or constant Supervision/Assistance  Patient can return home with the following A lot of help with walking and/or transfers;A lot of help with bathing/dressing/bathroom;Direct supervision/assist for medications management;Direct supervision/assist for financial management;Help with stairs or ramp for  entrance;Assistance with feeding   Equipment Recommendations  Wheelchair (measurements PT);Wheelchair cushion (measurements PT);BSC/3in1    Recommendations for Other Services       Precautions / Restrictions Precautions Precautions: Fall Precaution Comments: seizure, EEG monitoring     Mobility  Bed Mobility Overal bed mobility: Needs Assistance Bed Mobility: Supine to Sit     Supine to sit: HOB elevated, Max assist Sit to supine: Mod assist   General bed mobility comments: increased time to engage, showed her phone with photo of grandchildren and engaged in conversation about current situation prior to pt initiating movement; needed assist to move legs to EOB and then pt pulled up to sit with R UE and A for trunk, to supine/side with mod A for LE's    Transfers Overall transfer level: Needs assistance   Transfers: Sit to/from Stand Sit to Stand: Mod assist, From elevated surface, Max assist          Lateral/Scoot Transfers: Mod assist General transfer comment: stood x 4 from EOB with inital lifting help, but progressively pt providing more LE push for liftoff; while standing attempted side stepping to [facility], but pt unable even with lateral weight shift, so seated pt holding bed rail, mod A for lateral scoot toward [facility]    Ambulation/Gait                   Stairs             Wheelchair Mobility    Modified Rankin (Stroke Patients Only)       Balance Overall balance assessment: Needs assistance   Sitting balance-Leahy Scale: Fair Sitting balance - Comments: on EOB no  UE support   Standing balance support: Bilateral upper extremity supported Standing balance-Leahy Scale: Poor Standing balance comment: needs support in standing, initially pt flexed over PT assisting in front, progressively improved upright posture with successive trials of standing                            Cognition Arousal/Alertness: Awake/alert Behavior During  Therapy: Flat affect Overall Cognitive Status: Impaired/Different from baseline Area of Impairment: Attention, Following commands, Safety/judgement, Problem solving, Orientation                 Orientation Level: Disoriented to, Place, Time (knew Sept 2023, but not date or day; knew hospital, but not the name) Current Attention Level: Sustained   Following Commands: Follows one step commands with increased time, Follows one step commands consistently Safety/Judgement: Decreased awareness of safety, Decreased awareness of deficits Awareness: Intellectual Problem Solving: Slow processing, Decreased initiation, Difficulty sequencing, Requires verbal cues, Requires tactile cues          Exercises      General Comments General comments (skin integrity, edema, etc.): noted hands with arthritic deformities so pt not able to utilize walker for standing; HR 114 with activity      Pertinent Vitals/Pain Pain Assessment Pain Score: 0-No pain    Home Living                          Prior Function            PT Goals (current goals can now be found in the care plan section) Progress towards PT goals: Not progressing toward goals - comment    Frequency    Min 3X/week      PT Plan Discharge plan needs to be updated    Co-evaluation              AM-PAC PT "6 Clicks" Mobility   Outcome Measure  Help needed turning from your back to your side while in a flat bed without using bedrails?: Total Help needed moving from lying on your back to sitting on the side of a flat bed without using bedrails?: Total Help needed moving to and from a bed to a chair (including a wheelchair)?: Total Help needed standing up from a chair using your arms (e.g., wheelchair or bedside chair)?: Total Help needed to walk in hospital room?: Total Help needed climbing 3-5 steps with a railing? : Total 6 Click Score: 6    End of Session Equipment Utilized During Treatment: Gait  belt Activity Tolerance: Patient limited by fatigue Patient left: in bed;with call bell/phone within reach;with bed alarm set   PT Visit Diagnosis: Other abnormalities of gait and mobility (R26.89);Muscle weakness (generalized) (M62.81);Other symptoms and signs involving the nervous system (R29.898)     Time: 8657-8469 PT Time Calculation (min) (ACUTE ONLY): 29 min  Charges:  $Therapeutic Activity: 23-37 mins                     Sheran Lawless, PT Acute Rehabilitation Services Office:[phone] 11/24/2021    Elray Mcgregor 11/24/2021, 6:47 PM

## 2021-11-24 NOTE — Procedures (Signed)
Patient Name: Kristen Ramos  MRN: [id number]  Epilepsy Attending: Charlsie Quest  Referring Physician/Provider: Erick Blinks, MD Duration: 11/23/2021 2248 to  11/24/2021 2248  Patient history: 61yo F with seizure in setting of PRES. EEG to evaluate for seizure.  Level of alertness: Awake, asleep  AEDs during EEG study: LEV  Technical aspects: This EEG study was done with scalp electrodes positioned according to the 10-20 International system of electrode placement. Electrical activity was reviewed with band pass filter of 1-70Hz , sensitivity of 7 uV/mm, display speed of 34mm/sec with a 60Hz  notched filter applied as appropriate. EEG data were recorded continuously and digitally stored.  Video monitoring was available and reviewed as appropriate.  Description: The posterior dominant rhythm consists of 8 Hz activity of moderate voltage (25-35 uV) seen predominantly in posterior head regions, symmetric and reactive to eye opening and eye closing. Sleep was characterized by vertex waves, sleep spindles (12 to 14 Hz), maximal frontocentral region.  EEG showed continuous generalized polymorphic 3 to 6 Hz theta-delta slowing. Hyperventilation and photic stimulation were not performed.     ABNORMALITY - Continuous slow, generalized  IMPRESSION: This study is suggestive of mild to moderate diffuse encephalopathy, nonspecific etiology. No seizures or epileptiform discharges were seen throughout the recording.  Britain Saber 

## 2021-11-24 NOTE — Progress Notes (Signed)
Progress Note   Patient: Kristen Ramos MWU:[id number] DOB: 12/28/1960 DOA: 11/19/2021     4 DOS: the patient was seen and examined on 11/24/2021   Brief hospital course: 61 year old woman discharged 8/18 after presenting with cyclical vomiting, had first-time seizure during her hospitalization confirmed with EEG, MRI brain concerning for PRES (possibly secondary to MTX), discharged on Keppra with plan for repeat brain MRI in 3 months who PRESENTED 9/10 with nausea and vomiting was found to be altered during her stay in the emergency department.  Admitted for acute encephalopathy, intractable nausea and vomiting.  Encephalopathy spontaneously improved but was noted to be hypertensive and several days into the hospitalization she developed generalized tonic-clonic seizures and was transferred to [facility] for continuous EEG.  Assessment and Plan: Generalized tonic-clonic seizures, acute metabolic encephalopathy -- Acute encephalopathy was present on admission but then spontaneously improved.  CT head no acute abnormalities.  Trivial elevation of ammonia on admission.   -- Transferred to [facility], continuous EEG.  Continue Keppra. -- Of note patient had seizures last hospitalization and was discharged on Keppra but this was discontinued by outpatient neurology. -- Clinically much improved today.  Continue Keppra.  Further management per neurology.  Recent hospitalization for PRES --Repeat MRI Brain without contrast in 3 months.  --Strict blood pressure control.  --follow up with neurology outpatient. Outpatient neurology can consider repeating routine EEG in a few months and then consider tapering off her AEDs.  Cyclic vomiting syndrome: --Appears resolved at this point.  Continue antiemetics as needed.   Accelerated blood pressure, no history of essential hypertension --Has been hypertensive during this hospitalization, will continue newly started antihypertensive therapy and recommend close  outpatient follow-up. --Consider outpatient work-up for secondary causes  Cigarette smoker --Recommend cessation   GERD --Continue pantoprazole 40 mg daily.   Rheumatoid arthritis --On methotrexate as an outpatient.  During last hospitalization was recommended that this be tapered or discontinued as it was thought to be precipitating agent for PRES -- Close outpatient follow-up with rheumatology.   Chart review  --seen by neurologist 8/24 in follow-up after hospitalization, "Called clinic 10/31/21 to report itching and rash concerning for possible allergic reaction to Keppra (levetiracetam). Nurse directed patient to be evaluated by PCP... Denied any seizure activities since the last seizure event, believed last seizure event was triggered by haldol received during the last hospitalization, reported having multiple SE from taking keppra including whole body rashes, nauseous, disruption of sleep at night due to the itchy rashes, as well as daytime sleepiness.She requests to discontinue keppra... PLAN... Stop Keppra"     Subjective:  Feels ok today No n/v  Physical Exam: Vitals:   11/24/21 0605 11/24/21 0737 11/24/21 1203 11/24/21 1512  BP: (!) 152/87 (!) 157/84 138/73 (!) 146/84  Pulse: 74 65 80 72  Resp: 16 16 16 17   Temp: 98.5 F (36.9 C) 98.2 F (36.8 C) 98.3 F (36.8 C) 98.2 F (36.8 C)  TempSrc: Oral Oral Oral Oral  SpO2: 100% 100% 98% 100%  Weight:      Height:       Physical Exam Vitals reviewed.  Constitutional:      General: She is not in acute distress.    Appearance: She is not ill-appearing or toxic-appearing.  Cardiovascular:     Rate and Rhythm: Normal rate and regular rhythm.     Heart sounds: No murmur heard. Pulmonary:     Effort: Pulmonary effort is normal. No respiratory distress.     Breath sounds:  No wheezing, rhonchi or rales.  Neurological:     Mental Status: She is alert.  Psychiatric:        Mood and Affect: Mood normal.        Behavior:  Behavior normal.    Data Reviewed:  CBG stable  Family Communication: sister at bedside  Disposition: Status is: Inpatient Remains inpatient appropriate because: video EEG monitoring  Planned Discharge Destination:  TBD    Time spent: 35 minutes  Author: Brendia Sacks, MD 11/24/2021 5:47 PM  For on call review [email or web address].

## 2021-11-25 DIAGNOSIS — G9341 Metabolic encephalopathy: Secondary | ICD-10-CM | POA: Diagnosis not present

## 2021-11-25 DIAGNOSIS — I6783 Posterior reversible encephalopathy syndrome: Secondary | ICD-10-CM | POA: Diagnosis not present

## 2021-11-25 DIAGNOSIS — R1115 Cyclical vomiting syndrome unrelated to migraine: Secondary | ICD-10-CM | POA: Diagnosis not present

## 2021-11-25 DIAGNOSIS — I10 Essential (primary) hypertension: Secondary | ICD-10-CM | POA: Diagnosis not present

## 2021-11-25 DIAGNOSIS — R569 Unspecified convulsions: Secondary | ICD-10-CM | POA: Diagnosis not present

## 2021-11-25 LAB — GLUCOSE, CAPILLARY
Glucose-Capillary: 104 mg/dL — ABNORMAL HIGH (ref 70–99)
Glucose-Capillary: 90 mg/dL (ref 70–99)

## 2021-11-25 MED ORDER — LEVETIRACETAM IN NACL 500 MG/100ML IV SOLN
500.0000 mg | Freq: Two times a day (BID) | INTRAVENOUS | Status: DC
  Administered 2021-11-25 – 2021-11-26 (×3): 500 mg via INTRAVENOUS
  Filled 2021-11-25 (×4): qty 100

## 2021-11-25 MED ORDER — SODIUM CHLORIDE 0.9 % IV SOLN
200.0000 mg | Freq: Once | INTRAVENOUS | Status: AC
  Administered 2021-11-25: 200 mg via INTRAVENOUS
  Filled 2021-11-25: qty 20

## 2021-11-25 MED ORDER — LACOSAMIDE 50 MG PO TABS
50.0000 mg | ORAL_TABLET | Freq: Two times a day (BID) | ORAL | Status: DC
  Administered 2021-11-26 – 2021-11-27 (×3): 50 mg via ORAL
  Filled 2021-11-25 (×3): qty 1

## 2021-11-25 NOTE — Progress Notes (Signed)
LTM EEG discontinued - no skin breakdown at unhook.   

## 2021-11-25 NOTE — Plan of Care (Signed)

## 2021-11-25 NOTE — Progress Notes (Signed)
EEG maint complete.  ?

## 2021-11-25 NOTE — TOC Progression Note (Signed)
Transition of Care [facility]) - Progression Note    Patient Details  Name: Kristen Ramos MRN: [id number] Date of Birth: [birth year]  Transition of [facility] CM/SW [place], LCSW Phone Number: 11/25/2021, 11:49 AM  Clinical Narrative:    CSW spoke with patient's daughter and discussed discharge plan since CIR declined. She stated that she is hopeful for patient to be able to progress with therapies at the hospital. OT to see patient today. She will come to the hospital and assess patient today for her progress and let TOC team know her thoughts.    Expected Discharge Plan: [place] Barriers to Discharge: Continued Medical Work up  Expected Discharge Plan and Services Expected Discharge Plan: [place] In-house Referral: Clinical Social Work Discharge Planning Services: CM Consult Post Acute Care Choice: Home Health, Durable Medical Equipment Living arrangements for the past 2 months: Single Family Home                                       Social Determinants of Health (SDOH) Interventions    Readmission Risk Interventions     No data to display

## 2021-11-25 NOTE — Progress Notes (Signed)
Neurology progress note  S: Patient is much more alert and conversant today. Oriented x3. She is upset to learn that she has been started back on seizure medicine and states that she will not take keppra if she is discharged on it. She is not happy about any seizure medicine at all but is willing to take an alternative medication to keppra if she has to.  LTM EEG overnight: moderate diffuse slowing  O:  Vitals:   11/25/21 1200 11/25/21 1933  BP: 138/77 (!) 110/58  Pulse: 77 98  Resp: 16   Temp: 98.5 F (36.9 C) 97.8 F (36.6 C)  SpO2: 100% 100%   A&Ox3, NAD Resp: CTAB CV: RRR  Neurologic exam MS: A&Ox3, follows simple commands Speech: mild dysarthria, able to name and repeat CN: PERRL, VFF, EOMI, sensation intact, face symmetric at rest, hearing intact to voice Motor: 4/5 diffusely Sensory: SILT Reflexes: 2+ symm throughout, toes down Coordination: FNF intact bilat Gait: deferred  MRI brain 0/14/23 personally reviewed, truncated exam 2/2 cooperation and also motion degraded, no clear e/o infarct although findings c/w PRES have progressed since last admission.   A/P: 61 y.o. female with a past medical history medical history significant for cyclic vomiting syndrome, allergic rhinitis, osteoarthritis, chronic back pain, GERD, rheumatoid arthritis, PRES, who presented to the emergency department on 11/19/2021 via EMS due to abdominal pain, nausea and vomiting. Patient had witnessed [facility] seizure 9/14 and [facility]2 concern for subclinical seizures given patient's persistent encephalopathy and recent dx of PRES.  Since starting keppra prior to transfer she has improved significantly. Upon further chart review it appears that keppra was discontinued as an outpatient 2/2 lethargy and mild rash but she is tolerating it here without side effects. She was upset to learn that she had been started back on seizure medicine and we had a long conversation about how  important AED coverage is until her PRES resolves. She states that she will not take keppra if she is discharged on it but will consider an alternative AED.  She can likely be slowly weaned off as o/p after 3 mos if seizure free.   Recommendations: - D/c LTM - Vimpat 200mg  IV now f/b 50mg  bid - Decrease keppra to 500mg  bid, if feeling well and seizure free can discharge on vimpat monotherapy - Maintain seizure precautions - Maintain airway precautions -Notify provider for any seizure activity or change in neuro status  Will f/u tmrw  Kristen Monks, MD [facility] [phone]  If 7pm- 7am, please page neurology on call as listed in [place].

## 2021-11-25 NOTE — Evaluation (Signed)
Occupational Therapy Evaluation Patient Details Name: Kristen Ramos MRN: [id number] DOB: [birth year] Today's Date: 11/25/2021   History of Present Illness 61 years old female with PMH significant for rheumatoid arthritis on methotrexate, history of AVN, cyclic vomiting syndrome, obesity, recent admission for CVS in which she had seizure and PRES and chronic back pain who presented with vomiting and appeared to have confusion.   Clinical Impression   Pt independent at baseline with ADLs and functional mobility; lives with spouse and granddaughter who can assist at d/c. Pt currently with decreased cognition, flat affect, slow processing and impaired sequencing with tasks during session. Pt needing min -mod A +2 for ADLs, mod A for bed mobility, and min-mod A+2 for transfers with 2 person assist. Pt's daughter present during session, daughter hopeful pt will progress well and return home. Currently pt presenting with impairments listed below, will follow acutely. Recommend SNF at d/c pending progression, may be able to d/c home with [facility] if family able to provide necessary level  of assistance.     Recommendations for follow up therapy are one component of a multi-disciplinary discharge planning process, led by the attending physician.  Recommendations may be updated based on patient status, additional functional criteria and insurance authorization.   Follow Up Recommendations  Skilled nursing-short term rehab (<3 hours/day)    Assistance Recommended at Discharge Frequent or constant Supervision/Assistance  Patient can return home with the following A lot of help with walking and/or transfers;A lot of help with bathing/dressing/bathroom;Direct supervision/assist for medications management;Direct supervision/assist for financial management;Assist for transportation    Functional Status Assessment  Patient has had a recent decline in their functional status and demonstrates the ability to make  significant improvements in function in a reasonable and predictable amount of time.  Equipment Recommendations  Other (comment);BSC/3in1 (RW)    Recommendations for Other Services PT consult     Precautions / Restrictions Precautions Precautions: Fall Precaution Comments: seizure, EEG monitoring Restrictions Weight Bearing Restrictions: No      Mobility Bed Mobility Overal bed mobility: Needs Assistance Bed Mobility: Supine to Sit     Supine to sit: Mod assist     General bed mobility comments: cues for sequencing to come to EOB    Transfers Overall transfer level: Needs assistance Equipment used: 2 person hand held assist Transfers: Sit to/from Stand, Bed to chair/wheelchair/BSC Sit to Stand: Min assist, +2 physical assistance, Mod assist     Step pivot transfers: Mod assist, +2 physical assistance, Min assist            Balance Overall balance assessment: Needs assistance Sitting-balance support: Feet supported Sitting balance-Leahy Scale: Fair     Standing balance support: Bilateral upper extremity supported Standing balance-Leahy Scale: Poor Standing balance comment: reliant on external support                           ADL either performed or assessed with clinical judgement   ADL Overall ADL's : Needs assistance/impaired Eating/Feeding: Minimal assistance;Sitting Eating/Feeding Details (indicate cue type and reason): cutting up food Grooming: Minimal assistance;Standing;Sitting   Upper Body Bathing: Moderate assistance   Lower Body Bathing: Maximal assistance   Upper Body Dressing : Moderate assistance   Lower Body Dressing: Maximal assistance   Toilet Transfer: +2 for physical assistance;Moderate assistance;Minimal assistance   Toileting- Clothing Manipulation and Hygiene: Total assistance Toileting - Clothing Manipulation Details (indicate cue type and reason): pericare     Functional mobility during ADLs:  Minimal  assistance;Moderate assistance;+2 for physical assistance General ADL Comments: mod A for transfers from lower surface     Vision   Vision Assessment?: No apparent visual deficits     Perception     Praxis      Pertinent Vitals/Pain Pain Assessment Pain Assessment: No/denies pain     Hand Dominance Right   Extremity/Trunk Assessment Upper Extremity Assessment Upper Extremity Assessment: LUE deficits/detail;RUE deficits/detail;Generalized weakness RUE Deficits / Details: 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, arthritic deformities RUE Coordination: decreased fine motor LUE Deficits / Details: 1 PIP contracture LUE Coordination: decreased fine motor   Lower Extremity Assessment Lower Extremity Assessment: Defer to PT evaluation   Cervical / Trunk Assessment Cervical / Trunk Assessment: Normal   Communication Communication Communication: Expressive difficulties;Other (comment);Receptive difficulties (decreased responses)   Cognition Arousal/Alertness: Awake/alert Behavior During Therapy: Flat affect Overall Cognitive Status: Impaired/Different from baseline Area of Impairment: Attention, Following commands, Safety/judgement, Problem solving, Orientation                   Current Attention Level: Sustained   Following Commands: Follows one step commands with increased time, Follows one step commands consistently Safety/Judgement: Decreased awareness of safety, Decreased awareness of deficits Awareness: Intellectual Problem Solving: Slow processing, Decreased initiation, Difficulty sequencing, Requires verbal cues, Requires tactile cues General Comments: daughter present for encouragement     General Comments  VSS on RA, daughter present and supportive    Exercises     Shoulder Instructions      Home Living Family/patient expects to be discharged to:: Private residence Living Arrangements: Other relatives;Spouse/significant other (granddaughter) Available  Help at Discharge: Family Type of Home: House Home Access: Level entry     Home Layout: Two level;Bed/bath upstairs Alternate Level Stairs-Number of Steps: 14 Alternate Level Stairs-Rails: Right;Left;Can reach both Bathroom Shower/Tub: Chief Strategy Officer: Standard     Home Equipment: None   Additional Comments: lives with spouse, caretaker for her niece - per daughter in room      Prior Functioning/Environment Prior Level of Function : Independent/Modified Independent;Driving             Mobility Comments: reports independence in mobility; did not use AD ADLs Comments: reports independent with ADLs, IADLs, and driving including for grocery shopping and errands        OT Problem List: Decreased strength;Impaired balance (sitting and/or standing);Decreased activity tolerance;Decreased cognition;Decreased safety awareness;Decreased coordination      OT Treatment/Interventions: Self-care/ADL training;Therapeutic exercise;Energy conservation;DME and/or AE instruction;Therapeutic activities;Patient/family education;Balance training    OT Goals(Current goals can be found in the care plan section) Acute Rehab OT Goals Patient Stated Goal: to go home OT Goal Formulation: With patient Time For Goal Achievement: 12/09/21 Potential to Achieve Goals: Good ADL Goals Pt Will Perform Grooming: with supervision;standing Pt Will Perform Upper Body Dressing: with supervision;standing Pt Will Perform Lower Body Dressing: with supervision;sit to/from stand;sitting/lateral leans Pt Will Transfer to Toilet: with supervision;regular height toilet;ambulating Pt Will Perform Tub/Shower Transfer: Shower transfer;Tub transfer;ambulating;shower seat Additional ADL Goal #1: pt will perform 4 step trailmaking task in prep for ADLs  OT Frequency: Min 2X/week    Co-evaluation              AM-PAC OT "6 Clicks" Daily Activity     Outcome Measure Help from another person eating  meals?: A Little Help from another person taking care of personal grooming?: A Little Help from another person toileting, which includes using toliet, bedpan, or urinal?: A  Lot Help from another person bathing (including washing, rinsing, drying)?: A Lot Help from another person to put on and taking off regular upper body clothing?: A Lot Help from another person to put on and taking off regular lower body clothing?: A Lot 6 Click Score: 14   End of Session Nurse Communication: Mobility status  Activity Tolerance: Patient tolerated treatment well Patient left: in chair;with call bell/phone within reach;with chair alarm set;with family/visitor present  OT Visit Diagnosis: Other abnormalities of gait and mobility (R26.89);Muscle weakness (generalized) (M62.81);Other symptoms and signs involving cognitive function                Time: 1749-4496 OT Time Calculation (min): 35 min Charges:  OT General Charges $OT Visit: 1 Visit OT Evaluation $OT Eval Moderate Complexity: 1 Mod OT Treatments $Self Care/Home Management : 8-22 mins  Lynnda Child, OTD, OTR/L Acute Rehab [phone]) [phone] - [place] 11/25/2021, 4:18 PM

## 2021-11-25 NOTE — Procedures (Signed)
Patient Name: Kristen Ramos  MRN: [id number]  Epilepsy Attending: Lora Havens  Referring Physician/Provider: Donnetta Simpers, MD Duration: 11/24/2021 2248 to  11/25/2021 1348   Patient history: 61yo F with seizure in setting of PRES. EEG to evaluate for seizure.   Level of alertness: Awake, asleep   AEDs during EEG study: LEV   Technical aspects: This EEG study was done with scalp electrodes positioned according to the 10-20 International system of electrode placement. Electrical activity was reviewed with band pass filter of 1-70Hz , sensitivity of 7 uV/mm, display speed of 92mm/sec with a 60Hz  notched filter applied as appropriate. EEG data were recorded continuously and digitally stored.  Video monitoring was available and reviewed as appropriate.   Description: The posterior dominant rhythm consists of 8 Hz activity of moderate voltage (25-35 uV) seen predominantly in posterior head regions, symmetric and reactive to eye opening and eye closing. Sleep was characterized by vertex waves, sleep spindles (12 to 14 Hz), maximal frontocentral region.  EEG showed continuous generalized polymorphic 3 to 6 Hz theta-delta slowing. Hyperventilation and photic stimulation were not performed.      ABNORMALITY - Continuous slow, generalized   IMPRESSION: This study is suggestive of mild to moderate diffuse encephalopathy, nonspecific etiology. No seizures or epileptiform discharges were seen throughout the recording.   Melinda Pottinger Barbra Sarks

## 2021-11-25 NOTE — Progress Notes (Signed)
Inpatient Rehab Admissions Coordinator:   Per therapy recommendations, patient was screened for CIR candidacy by Clemens Catholic, MS, CCC-SLP . At this time, Pt. does not appear to demonstrate medical necessity to justify in hospital rehabilitation/CIR.Medicare advantage plan is also unlikely to approve this dx. I  will not pursue a rehab consult for this Pt.   Recommend other rehab venues to be pursued.  Please contact me with any questions.  Clemens Catholic, [place], [place] Admissions Coordinator  [phone] ([place]) [phone] (office)

## 2021-11-25 NOTE — Progress Notes (Signed)
Progress Note   Patient: Kristen Ramos QAS:[id number] DOB: 09-15-1960 DOA: 11/19/2021     5 DOS: the patient was seen and examined on 11/25/2021   Brief hospital course: 61 year old woman discharged 8/18 after presenting with cyclical vomiting, had first-time seizure during her hospitalization confirmed with EEG, MRI brain concerning for PRES (possibly secondary to MTX), discharged on Keppra with plan for repeat brain MRI in 3 months who PRESENTED 9/10 with nausea and vomiting was found to be altered during her stay in the emergency department.  Admitted for acute encephalopathy, intractable nausea and vomiting.  Encephalopathy spontaneously improved but was noted to be hypertensive and several days into the hospitalization she developed generalized tonic-clonic seizures and was transferred to [facility] for continuous EEG.  Assessment and Plan: Generalized tonic-clonic seizures, acute metabolic encephalopathy -- Acute encephalopathy was present on admission but then spontaneously improved.  CT head no acute abnormalities.  Trivial elevation of ammonia on admission.   -- Transferred to [facility], continuous EEG.  Continue Keppra. -- Of note patient had seizures last hospitalization and was discharged on Keppra but this was discontinued by outpatient neurology. -- Clinically continues to improve.  Continue Keppra.     Recent hospitalization for PRES --Repeat MRI Brain without contrast in 3 months.  --Strict blood pressure control.  --follow up with neurology outpatient. Outpatient neurology can consider repeating routine EEG in a few months and then consider tapering off her AEDs.   Cyclic vomiting syndrome: -- Resolved.  Continue antiemetics as needed.   Accelerated blood pressure, no history of essential hypertension -- Had been been hypertensive during this hospitalization, will continue newly started antihypertensive therapy and recommend close outpatient follow-up. --Consider outpatient  work-up for secondary causes   Cigarette smoker --Recommend cessation   GERD --Continue pantoprazole 40 mg daily.   Rheumatoid arthritis --On methotrexate as an outpatient.  During last hospitalization was recommended that this be tapered or discontinued as it was thought to be precipitating agent for PRES -- Close outpatient follow-up with rheumatology.   Chart review  --seen by neurologist 8/24 in follow-up after hospitalization, "Called clinic 10/31/21 to report itching and rash concerning for possible allergic reaction to Keppra (levetiracetam). Nurse directed patient to be evaluated by PCP... Denied any seizure activities since the last seizure event, believed last seizure event was triggered by haldol received during the last hospitalization, reported having multiple SE from taking keppra including whole body rashes, nauseous, disruption of sleep at night due to the itchy rashes, as well as daytime sleepiness.She requests to discontinue keppra... PLAN... Stop Keppra"      Subjective:  Feels better Eating ok  Physical Exam: Vitals:   11/24/21 1956 11/24/21 2356 11/25/21 0407 11/25/21 1200  BP: 129/73 (!) 141/78 (!) 142/89 138/77  Pulse: 74 74 89 77  Resp: 16 20 17 16   Temp: 97.7 F (36.5 C) 98.7 F (37.1 C) 98.7 F (37.1 C) 98.5 F (36.9 C)  TempSrc: Oral Oral Oral Oral  SpO2: 100% 100% 100% 100%  Weight:      Height:       Physical Exam Vitals reviewed.  Constitutional:      General: She is not in acute distress.    Appearance: She is not ill-appearing or toxic-appearing.  Cardiovascular:     Rate and Rhythm: Normal rate and regular rhythm.     Heart sounds: No murmur heard. Pulmonary:     Effort: Pulmonary effort is normal. No respiratory distress.     Breath sounds: No wheezing,  rhonchi or rales.  Neurological:     Mental Status: She is alert and oriented to person, place, and time.  Psychiatric:        Mood and Affect: Mood normal.        Behavior:  Behavior normal.     Data Reviewed:  CBG stable  Family Communication:   Disposition: Status is: Inpatient Remains inpatient appropriate because: on cEEG  Planned Discharge Destination:  TBD    Time spent: 20 minutes  Author: Murray Hodgkins, MD 11/25/2021 1:13 PM  For on call review [email or web address].

## 2021-11-26 DIAGNOSIS — R569 Unspecified convulsions: Secondary | ICD-10-CM | POA: Diagnosis not present

## 2021-11-26 DIAGNOSIS — M069 Rheumatoid arthritis, unspecified: Secondary | ICD-10-CM | POA: Diagnosis not present

## 2021-11-26 DIAGNOSIS — I10 Essential (primary) hypertension: Secondary | ICD-10-CM | POA: Diagnosis not present

## 2021-11-26 DIAGNOSIS — R1115 Cyclical vomiting syndrome unrelated to migraine: Secondary | ICD-10-CM | POA: Diagnosis not present

## 2021-11-26 MED ORDER — ORAL CARE MOUTH RINSE: 15.0000 mL | OROMUCOSAL | Status: DC | PRN

## 2021-11-26 NOTE — Progress Notes (Signed)
Progress Note   Patient: Kristen Ramos JIR:[id number] DOB: Dec 29, 1960 DOA: 11/19/2021     6 DOS: the patient was seen and examined on 11/26/2021   Brief hospital course: 61 year old woman discharged 8/18 after presenting with cyclical vomiting, had first-time seizure during her hospitalization confirmed with EEG, MRI brain concerning for PRES (possibly secondary to MTX), discharged on Keppra with plan for repeat brain MRI in 3 months who PRESENTED 9/10 with nausea and vomiting was found to be altered during her stay in the emergency department.  Admitted for acute encephalopathy, intractable nausea and vomiting.  Encephalopathy spontaneously improved but was noted to be hypertensive and several days into the hospitalization she developed generalized tonic-clonic seizures and was transferred to [facility] for continuous EEG.  Started on antiepileptic with gradual clinical improvement.  Now appears to be at baseline.  Plan for SNF.  Assessment and Plan: Generalized tonic-clonic seizures, acute metabolic encephalopathy -- Acute encephalopathy was present on admission but then spontaneously improved.  CT head no acute abnormalities.  Trivial elevation of ammonia on admission.   -- Transferred to [facility], continuous EEG.  Continue Keppra. -- Of note patient had seizures last hospitalization and was discharged on Keppra but this was discontinued by outpatient neurology. -- Clinically continues to improve.  However patient does not want to be on Keppra, transition to Vimpat, taper off Keppra. --per neurology: Please continue keppra 500mg  bid and vimpat 50mg  bid until discharge and continue vimpat 50mg  bid monotherapy after that. She may f/u with her established outpatient neurologist after discharge. She should remain on AEDs for 3 mos or until MRI changes associated with PRES resolve since she has had seizures on 2 recent admissions in the setting of PRES. Please reiterate at discharge that patient may not  drive x6 mos after last seizure according to [facility] law. Neurology to sign off but please re-engage if additional neurologic concerns arise.  Recent hospitalization for PRES --Repeat MRI Brain without contrast in 3 months.  --Strict blood pressure control.  --follow up with neurology outpatient.     Cyclic vomiting syndrome: -- Resolved.  Continue antiemetics as needed.   Accelerated blood pressure, no history of essential hypertension -- Had been been hypertensive during this hospitalization, will continue newly started antihypertensive therapy and recommend close outpatient follow-up. --Consider outpatient work-up for secondary causes   Cigarette smoker --Recommend cessation   GERD --Continue pantoprazole 40 mg daily.   Rheumatoid arthritis --On methotrexate as an outpatient.  During last hospitalization was recommended that this be tapered or discontinued as it was thought to be precipitating agent for PRES -- Close outpatient follow-up with rheumatology.   Chart review  --seen by neurologist 8/24 in follow-up after hospitalization, "Called clinic 10/31/21 to report itching and rash concerning for possible allergic reaction to Keppra (levetiracetam). Nurse directed patient to be evaluated by PCP... Denied any seizure activities since the last seizure event, believed last seizure event was triggered by haldol received during the last hospitalization, reported having multiple SE from taking keppra including whole body rashes, nauseous, disruption of sleep at night due to the itchy rashes, as well as daytime sleepiness.She requests to discontinue keppra... PLAN... Stop Keppra"     Subjective:  Feels better No seizures  Physical Exam: Vitals:   11/26/21 0017 11/26/21 0348 11/26/21 0735 11/26/21 1529  BP: (!) 138/90 (!) 141/78 104/66 119/77  Pulse: 98 78 87 95  Resp:  17 17 17   Temp: 97.9 F (36.6 C) 97.7 F (36.5 C) 98.3 F (36.8  C) (!) 97.4 F (36.3 C)  TempSrc: Oral  Oral Oral   SpO2: 99% 96% 99% 99%  Weight:      Height:       Physical Exam Vitals reviewed.  Constitutional:      General: She is not in acute distress.    Appearance: She is not ill-appearing or toxic-appearing.  Cardiovascular:     Rate and Rhythm: Normal rate and regular rhythm.     Heart sounds: No murmur heard.    Comments: Telemetry SR Neurological:     Mental Status: She is alert.  Psychiatric:        Mood and Affect: Mood normal.        Behavior: Behavior normal.     Data Reviewed:  CBG stable  Family Communication: none  Disposition: Status is: Inpatient Remains inpatient appropriate because: s/p seizure; needs rehab  Planned Discharge Destination: Skilled nursing facility    Time spent: 20 minutes  Author: Brendia Sacks, MD 11/26/2021 4:57 PM  For on call review [email or web address].

## 2021-11-26 NOTE — Plan of Care (Signed)
Neurology plan of care  Patient awaiting placement. Please continue keppra 500mg  bid and vimpat 50mg  bid until discharge and continue vimpat 50mg  bid monotherapy after that. She may f/u with her established outpatient neurologist after discharge. She should remain on AEDs for 3 mos or until MRI changes associated with PRES resolve since she has had seizures on 2 recent admissions in the setting of PRES. Please reiterate at discharge that patient may not drive x6 mos after last seizure according to [facility] law. Neurology to sign off but please re-engage if additional neurologic concerns arise.  Su Monks, MD [facility] [phone]  If 7pm- 7am, please page neurology on call as listed in [place].

## 2021-11-27 DIAGNOSIS — M069 Rheumatoid arthritis, unspecified: Secondary | ICD-10-CM | POA: Diagnosis not present

## 2021-11-27 DIAGNOSIS — G9341 Metabolic encephalopathy: Secondary | ICD-10-CM | POA: Diagnosis not present

## 2021-11-27 DIAGNOSIS — R569 Unspecified convulsions: Secondary | ICD-10-CM | POA: Diagnosis not present

## 2021-11-27 LAB — GLUCOSE, CAPILLARY: Glucose-Capillary: 113 mg/dL — ABNORMAL HIGH (ref 70–99)

## 2021-11-27 MED ORDER — LACOSAMIDE 50 MG PO TABS: 50.0000 mg | ORAL_TABLET | Freq: Two times a day (BID) | ORAL | 2 refills | Status: DC

## 2021-11-27 MED ORDER — LEVETIRACETAM 500 MG PO TABS
500.0000 mg | ORAL_TABLET | Freq: Two times a day (BID) | ORAL | Status: DC
  Administered 2021-11-27: 500 mg via ORAL
  Filled 2021-11-27: qty 1

## 2021-11-27 NOTE — Progress Notes (Signed)
Mobility Specialist: Progress Note   11/27/21 1405  Mobility  Activity Ambulated with assistance in hallway  Level of Assistance Contact guard assist, steadying assist  Assistive Device None  Distance Ambulated (ft) 380 ft  Activity Response Tolerated well  $Mobility charge 1 Mobility   Received pt in chair having no complaints and agreeable to mobility. Pt was asymptomatic throughout ambulation and returned to room w/o fault. Left in chair w/ call bell in reach and all needs met.  [facility] Alberto Pina Mobility Specialist Mobility Specialist [address]: [phone]

## 2021-11-27 NOTE — Progress Notes (Signed)
Occupational Therapy Treatment Patient Details Name: Kristen Ramos MRN: [id number] DOB: Aug 20, 1960 Today's Date: 11/27/2021   History of present illness 61 years old female with PMH significant for rheumatoid arthritis on methotrexate, history of AVN, cyclic vomiting syndrome, obesity, recent admission for [facility] in which she had seizure and PRES and chronic back pain who presented with vomiting and appeared to have confusion.   OT comments  Pt progressing towards established OT goals. Pt performing LB ADL and shower transfer with min guard A. Pt with mild frustration due to wish to go home. Pt with decreased awarenes decreased problem solving and cognitive deficits at this time. Deferring formal cognitive testing due to decreased frustration tolerance. Would recommend supervision with medication and financial management at this time. Recommending OP OT to optimize safety and independence with executive function skills. Pt reporting significant other available to assist as needed in the home setting as well.    Recommendations for follow up therapy are one component of a multi-disciplinary discharge planning process, led by the attending physician.  Recommendations may be updated based on patient status, additional functional criteria and insurance authorization.    Follow Up Recommendations  Outpatient OT    Assistance Recommended at Discharge Intermittent Supervision/Assistance  Patient can return home with the following  A little help with walking and/or transfers;A little help with bathing/dressing/bathroom;Assistance with cooking/housework;Direct supervision/assist for medications management;Direct supervision/assist for financial management;Assist for transportation;Help with stairs or ramp for entrance   Equipment Recommendations  BSC/3in1    Recommendations for Other Services      Precautions / Restrictions Precautions Precautions: Fall Precaution Comments:  seizure Restrictions Weight Bearing Restrictions: No       Mobility Bed Mobility Overal bed mobility: Modified Independent                  Transfers Overall transfer level: Needs assistance Equipment used: None Transfers: Sit to/from Stand Sit to Stand: Min guard           General transfer comment: slightly impulsive     Balance Overall balance assessment: Needs assistance Sitting-balance support: Feet supported Sitting balance-Leahy Scale: Good Sitting balance - Comments: No UE support while dressing sitting edge of cahir   Standing balance support: During functional activity Standing balance-Leahy Scale: Fair Standing balance comment: Pulling up pants standing in front of chair                           ADL either performed or assessed with clinical judgement   ADL Overall ADL's : Needs assistance/impaired                 Upper Body Dressing : Modified independent;Sitting   Lower Body Dressing: Supervision/safety;Min guard;Sit to/from stand Lower Body Dressing Details (indicate cue type and reason): pt performing LB dressing with supervision-min guard Toilet Transfer: Min guard;Ambulation Toilet Transfer Details (indicate cue type and reason): simulated in room     Tub/ Shower Transfer: Walk-in shower;Min Chief Executive Officer Details (indicate cue type and reason): min guard A for safety Functional mobility during ADLs: Min guard General ADL Comments: Min guard A for safety    Extremity/Trunk Assessment Upper Extremity Assessment Upper Extremity Assessment: LUE deficits/detail RUE Deficits / Details: 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, arthritic deformities RUE Coordination: decreased fine motor LUE Deficits / Details: 1 PIP contracture LUE Coordination: decreased fine motor   Lower Extremity Assessment Lower Extremity Assessment: Defer to PT evaluation  Vision   Vision Assessment?: No apparent visual deficits    Perception     Praxis      Cognition Arousal/Alertness: Awake/alert Behavior During Therapy: Impulsive Overall Cognitive Status: Impaired/Different from baseline Area of Impairment: Attention, Orientation, Following commands, Safety/judgement, Problem solving, Memory, Awareness                 Orientation Level: Disoriented to, Time, Situation Current Attention Level: Focused (easily distracted (TV and other healthcare professionals entering room)) Memory: Decreased short-term memory Following Commands: Follows one step commands with increased time, Follows one step commands consistently Safety/Judgement: Decreased awareness of safety Awareness: Emergent Problem Solving: Difficulty sequencing, Slow processing General Comments: Pt unaware of situation and why she was in the hospital and how long, pt re-oriented and appreciated info, pt with difficulty processing multistep commands with noted short term memory deficits. Pt with recall of walk and RN, but unsure what she was wanting to do on OT arrival (get dressed). Pt with decreased awareness of cognitive deficits, and reporting she feels normal. Pt reporting that PTA, she managed her own medication, and feels conmfortable getting bakc to that. Pt easily frustrated with therapist asking if pt had any concerns with return home. Pt able to recall medications previously taken, as well as 1-2 fall prevention strategies. Would highly recommend oversight for medication management upon return home as pt with decreased awareness of any cognitive difficulties.        Exercises      Shoulder Instructions       General Comments VSS    Pertinent Vitals/ Pain       Pain Assessment Pain Assessment: No/denies pain  Home Living                                          Prior Functioning/Environment              Frequency  Min 2X/week        Progress Toward Goals  OT Goals(current goals can now be found in  the care plan section)  Progress towards OT goals: Progressing toward goals  Acute Rehab OT Goals Patient Stated Goal: to go home OT Goal Formulation: With patient Time For Goal Achievement: 12/09/21 Potential to Achieve Goals: Good ADL Goals Pt Will Perform Grooming: with supervision;standing Pt Will Perform Upper Body Dressing: with supervision;standing Pt Will Perform Lower Body Dressing: with supervision;sit to/from stand;sitting/lateral leans Pt Will Transfer to Toilet: with supervision;regular height toilet;ambulating Pt Will Perform Tub/Shower Transfer: Shower transfer;Tub transfer;ambulating;shower seat Additional ADL Goal #1: pt will perform 4 step trailmaking task in prep for ADLs  Plan Discharge plan needs to be updated    Co-evaluation                 AM-PAC OT "6 Clicks" Daily Activity     Outcome Measure   Help from another person eating meals?: A Little Help from another person taking care of personal grooming?: A Little Help from another person toileting, which includes using toliet, bedpan, or urinal?: A Little Help from another person bathing (including washing, rinsing, drying)?: A Little Help from another person to put on and taking off regular upper body clothing?: A Little Help from another person to put on and taking off regular lower body clothing?: A Little 6 Click Score: 18    End of Session    OT Visit Diagnosis: Other  abnormalities of gait and mobility (R26.89);Muscle weakness (generalized) (M62.81);Other symptoms and signs involving cognitive function   Activity Tolerance Patient tolerated treatment well   Patient Left in chair;with call bell/phone within reach;with chair alarm set   Nurse Communication Mobility status        Time: 1131-1159 OT Time Calculation (min): 28 min  Charges: OT General Charges $OT Visit: 1 Visit OT Treatments $Self Care/Home Management : 23-37 mins  Ladene Artist, OTR/L [facility] Acute  Rehabilitation Office: [phone]   Drue Novel 11/27/2021, 12:41 PM

## 2021-11-27 NOTE — Discharge Summary (Signed)
Physician Discharge Summary   Patient: Kristen Ramos MRN: [id number] DOB: 06/07/1960  Admit date:     11/19/2021  Discharge date: 11/27/21  Discharge Physician: Murray Hodgkins   PCP: Mindi Curling, PA-C   Recommendations at discharge:   Generalized tonic-clonic seizures, acute metabolic encephalopathy --Please continue vimpat 50mg  bid monotherapy. She should remain on AEDs for 3 mos or until MRI changes associated with PRES resolve since she has had seizures on 2 recent admissions in the setting of PRES.  --usual seizure precautions   Recent hospitalization for PRES --Repeat MRI Brain without contrast in 3 months.  --follow up with neurology outpatient.     Elevated blood pressure, no history of essential hypertension -- Had been been hypertensive during this hospitalization, refuses antihypertensive on discharge, BP low normal, will need close outpatient followup   Rheumatoid arthritis --On methotrexate as an outpatient.  During last hospitalization was recommended that this be tapered or discontinued as it was thought to be precipitating agent for PRES -- Close outpatient follow-up with rheumatology.  Discharge Diagnoses: Principal Problem:   Seizure ([place]) Active Problems:   Cyclic vomiting syndrome   Accelerated hypertension   Rheumatoid arthritis (HCC)   Hypokalemia   GERD (gastroesophageal reflux disease)   Tobacco abuse   Hyperammonemia (HCC)   Acute metabolic encephalopathy   Obesity (BMI 30-39.9)   Posterior reversible encephalopathy syndrome  Resolved Problems:   * No resolved hospital problems. *  Hospital Course: 61 year old woman discharged 8/18 after presenting with cyclical vomiting, had first-time seizure during her hospitalization confirmed with EEG, MRI brain concerning for PRES (possibly secondary to MTX), discharged on Keppra with plan for repeat brain MRI in 3 months who PRESENTED 9/10 with nausea and vomiting was found to be altered during her stay  in the emergency department.  Admitted for acute encephalopathy, intractable nausea and vomiting.  Encephalopathy spontaneously improved but was noted to be hypertensive and several days into the hospitalization she developed generalized tonic-clonic seizures and was transferred to [facility] for continuous EEG.  Started on antiepileptic with gradual clinical improvement.  Now appears to be at baseline.  Plan for SNF.  Generalized tonic-clonic seizures, acute metabolic encephalopathy -- Acute encephalopathy was present on admission but then spontaneously improved.  CT head no acute abnormalities.  Trivial elevation of ammonia on admission.   -- Transferred to [facility], continuous EEG.  Continue Keppra. -- Of note patient had seizures last hospitalization and was discharged on Keppra but this was discontinued by outpatient neurology. -- Clinically continues to improve.  However patient does not want to be on Keppra, transition to Vimpat, off Keppra on discharge. --per neurology: Please continue keppra 500mg  bid and vimpat 50mg  bid until discharge and continue vimpat 50mg  bid monotherapy after that. She may f/u with her established outpatient neurologist after discharge. She should remain on AEDs for 3 mos or until MRI changes associated with PRES resolve since she has had seizures on 2 recent admissions in the setting of PRES. Please reiterate at discharge that patient may not drive x6 mos after last seizure according to [facility] law. Neurology to sign off but please re-engage if additional neurologic concerns arise.   Recent hospitalization for PRES --Repeat MRI Brain without contrast in 3 months.  --follow up with neurology outpatient.     Cyclic vomiting syndrome: -- Resolved.  Continue antiemetics as needed.   Elevated blood pressure, no history of essential hypertension -- Had been been hypertensive during this hospitalization, refuses antihypertensive on discharge, BP  low normal, will need close  outpatient followup   Cigarette smoker --Recommend cessation   GERD --Continue pantoprazole 40 mg daily.   Rheumatoid arthritis --On methotrexate as an outpatient.  During last hospitalization was recommended that this be tapered or discontinued as it was thought to be precipitating agent for PRES -- Close outpatient follow-up with rheumatology.   Chart review  --seen by neurologist 8/24 in follow-up after hospitalization, "Called clinic 10/31/21 to report itching and rash concerning for possible allergic reaction to Keppra (levetiracetam). Nurse directed patient to be evaluated by PCP... Denied any seizure activities since the last seizure event, believed last seizure event was triggered by haldol received during the last hospitalization, reported having multiple SE from taking keppra including whole body rashes, nauseous, disruption of sleep at night due to the itchy rashes, as well as daytime sleepiness.She requests to discontinue keppra... PLAN... Stop Keppra"       Pain control - [place] Controlled Substance Reporting System database was reviewed.   Consultants:  Neurology   Procedures performed:  None   Disposition: Home Diet recommendation:  Regular diet DISCHARGE MEDICATION: Allergies as of 11/27/2021       Reactions   Bee Venom Hives, Swelling        Medication List     STOP taking these medications    methotrexate 2.5 MG tablet Commonly known as: RHEUMATREX       TAKE these medications    benzonatate 100 MG capsule Commonly known as: TESSALON Take 1-2 capsules (100-200 mg total) by mouth 3 (three) times daily as needed for cough. What changed: how much to take   folic acid 1 MG tablet Commonly known as: FOLVITE Take 1 mg by mouth daily.   Horizant 600 MG Tbcr Generic drug: Gabapentin Enacarbil Take 600 mg by mouth 2 (two) times daily.   lacosamide 50 MG Tabs tablet Commonly known as: VIMPAT Take 1 tablet (50 mg total) by mouth 2 (two)  times daily.   lidocaine 5 % Commonly known as: Lidoderm Place 1 patch onto the skin daily as needed. Apply patch to area most significant pain once per day.  Remove and discard patch within 12 hours of application. What changed:  reasons to take this additional instructions   meclizine 25 MG tablet Commonly known as: ANTIVERT Take 1 tablet (25 mg total) by mouth 3 (three) times daily as needed for dizziness.   ondansetron 4 MG disintegrating tablet Commonly known as: ZOFRAN-ODT Take 4-8 mg by mouth in the morning, at noon, and at bedtime.   oxyCODONE 15 MG immediate release tablet Commonly known as: ROXICODONE Take 1 tablet (15 mg total) by mouth 4 (four) times daily as needed for pain. What changed: when to take this   pantoprazole 40 MG tablet Commonly known as: PROTONIX Take 1 tablet (40 mg total) by mouth daily.   polyethylene glycol 17 g packet Commonly known as: MIRALAX / GLYCOLAX Take 17 g by mouth daily. What changed:  when to take this reasons to take this   VITAMIN D-3 PO Take 1 capsule by mouth daily.               Durable Medical Equipment  (From admission, onward)           Start     Ordered   11/24/21 1506  For home use only DME Walker rolling  Once       Question Answer Comment  Walker: With 5 Inch Wheels   Patient needs  a walker to treat with the following condition Weakness      11/24/21 1505            Follow-up Information     Hermelinda Medicus, MD. Schedule an appointment as soon as possible for a visit.   Specialty: Internal Medicine Contact information: [id number] [place] [address] [phone]         Asencion Gowda, MD. Schedule an appointment as soon as possible for a visit in 1 week(s).   Specialty: Psychiatry Contact information: [facility] [place] [address] [phone]         Mindi Curling, PA-C. Schedule an appointment as soon as possible for a visit in  1 week(s).   Specialty: Physician Assistant Contact information: [place] [id number] [phone]                Feels much better  Discharge Exam: Filed Weights   11/19/21 1819 11/23/21 1848 11/23/21 2128  Weight: 94.8 kg 83.4 kg 86 kg   Physical Exam Vitals reviewed.  Constitutional:      General: She is not in acute distress.    Appearance: She is not ill-appearing or toxic-appearing.  Cardiovascular:     Rate and Rhythm: Normal rate and regular rhythm.     Heart sounds: No murmur heard. Pulmonary:     Effort: Pulmonary effort is normal. No respiratory distress.     Breath sounds: No wheezing, rhonchi or rales.  Neurological:     Mental Status: She is alert.  Psychiatric:        Mood and Affect: Mood normal.        Behavior: Behavior normal.      Condition at discharge: good  The results of significant diagnostics from this hospitalization (including imaging, microbiology, ancillary and laboratory) are listed below for reference.   Imaging Studies: Overnight EEG with video  Result Date: 11/24/2021 Lora Havens, MD     11/25/2021  8:25 AM Patient Name: Cyndia Sala MRN: [id number] Epilepsy Attending: Lora Havens Referring Physician/Provider: Donnetta Simpers, MD Duration: 11/23/2021 2248 to  11/24/2021 2248 Patient history: 61yo F with seizure in setting of PRES. EEG to evaluate for seizure. Level of alertness: Awake, asleep AEDs during EEG study: LEV Technical aspects: This EEG study was done with scalp electrodes positioned according to the 10-20 International system of electrode placement. Electrical activity was reviewed with band pass filter of 1-70Hz , sensitivity of 7 uV/mm, display speed of 32mm/sec with a 60Hz  notched filter applied as appropriate. EEG data were recorded continuously and digitally stored.  Video monitoring was available and reviewed as appropriate. Description: The posterior dominant rhythm consists  of 8 Hz activity of moderate voltage (25-35 uV) seen predominantly in posterior head regions, symmetric and reactive to eye opening and eye closing. Sleep was characterized by vertex waves, sleep spindles (12 to 14 Hz), maximal frontocentral region.  EEG showed continuous generalized polymorphic 3 to 6 Hz theta-delta slowing. Hyperventilation and photic stimulation were not performed.   ABNORMALITY - Continuous slow, generalized IMPRESSION: This study is suggestive of mild to moderate diffuse encephalopathy, nonspecific etiology. No seizures or epileptiform discharges were seen throughout the recording. Lora Havens   MR BRAIN W WO CONTRAST  Result Date: 11/23/2021 CLINICAL DATA:  TIA. Seizure. Stroke, follow-up. Neuro deficit, acute, stroke suspected. Recently diagnosed PRES. New left-sided weakness. EXAM: MRI HEAD WITHOUT AND WITH CONTRAST TECHNIQUE: Multiplanar, multiecho pulse sequences of the  brain and surrounding structures were obtained without and with intravenous contrast. CONTRAST:  9.27mL GADAVIST GADOBUTROL 1 MMOL/ML IV SOLN COMPARISON:  Head CT 11/20/2021 and MRI 10/26/2021 FINDINGS: Premedication and multiple attempts at repeat imaging, the study is severely motion degraded. Brain: Patchy cortical and subcortical T2/FLAIR hyperintensities in the occipital, parietal, and frontal lobes have progressed from the prior MRI, and there is now some involvement of the posterior temporal lobes. There is no associated enhancement or restricted diffusion. There is no evidence of an acute infarct, intracranial hemorrhage, midline shift, or extra-axial fluid collection. The ventricles are normal in size. Vascular: Major intracranial vascular flow voids are preserved. Skull and upper cervical spine: Unremarkable bone marrow signal. Sinuses/Orbits: Unremarkable orbits. Minimal mucosal thickening in the paranasal sinuses. Trace bilateral mastoid fluid. Other: None. IMPRESSION: 1. Severely motion degraded  examination. 2. Progressive symmetric cortical/subcortical T2 hyperintensity in both cerebral hemispheres which remains most suspicious for posterior reversible encephalopathy syndrome (PRES). Electronically Signed   By: Logan Bores M.D.   On: 11/23/2021 17:41   CT Head Wo Contrast  Result Date: 11/20/2021 CLINICAL DATA:  Mental status change with unknown cause EXAM: CT HEAD WITHOUT CONTRAST TECHNIQUE: Contiguous axial images were obtained from the base of the skull through the vertex without intravenous contrast. RADIATION DOSE REDUCTION: This exam was performed according to the departmental dose-optimization program which includes automated exposure control, adjustment of the mA and/or kV according to patient size and/or use of iterative reconstruction technique. COMPARISON:  Brain MRI 10/26/2021 FINDINGS: Brain: No evidence of acute infarction, hemorrhage, hydrocephalus, extra-axial collection or mass lesion/mass effect. FLAIR hyperintensity on recent brain MRI is not detected by CT. Vascular: No hyperdense vessel or unexpected calcification. Skull: Normal. Negative for fracture or focal lesion. Sinuses/Orbits: No acute finding. IMPRESSION: Negative head CT. Electronically Signed   By: Jorje Guild M.D.   On: 11/20/2021 05:14   DG Chest 2 View  Result Date: 11/18/2021 CLINICAL DATA:  Persistent cough EXAM: CHEST - 2 VIEW COMPARISON:  06/16/2019 FINDINGS: Cardiac and mediastinal contours are within normal limits. No focal pulmonary opacity. No pleural effusion or pneumothorax. No acute osseous abnormality. Status post ACDF IMPRESSION: No acute cardiopulmonary process. Electronically Signed   By: Merilyn Baba M.D.   On: 11/18/2021 01:09    Microbiology: Results for orders placed or performed during the hospital encounter of 11/17/21  Resp Panel by RT-PCR (Flu A&B, Covid) Anterior Nasal Swab     Status: None   Collection Time: 11/18/21 12:56 AM   Specimen: Anterior Nasal Swab  Result Value Ref  Range Status   SARS Coronavirus 2 by RT PCR NEGATIVE NEGATIVE Final    Comment: (NOTE) SARS-CoV-2 target nucleic acids are NOT DETECTED.  The SARS-CoV-2 RNA is generally detectable in upper respiratory specimens during the acute phase of infection. The lowest concentration of SARS-CoV-2 viral copies this assay can detect is 138 copies/mL. A negative result does not preclude SARS-Cov-2 infection and should not be used as the sole basis for treatment or other patient management decisions. A negative result may occur with  improper specimen collection/handling, submission of specimen other than nasopharyngeal swab, presence of viral mutation(s) within the areas targeted by this assay, and inadequate number of viral copies(<138 copies/mL). A negative result must be combined with clinical observations, patient history, and epidemiological information. The expected result is Negative.  Fact Sheet for Patients:  EntrepreneurPulse.com.au  Fact Sheet for Healthcare Providers:  IncredibleEmployment.be  This test is no t yet approved or cleared by the  [place] Architectural technologist and  has been authorized for detection and/or diagnosis of SARS-CoV-2 by FDA under an Print production planner (EUA). This EUA will remain  in effect (meaning this test can be used) for the duration of the COVID-19 declaration under Section 564(b)(1) of the Act, 21 U.S.C.section 360bbb-3(b)(1), unless the authorization is terminated  or revoked sooner.       Influenza A by PCR NEGATIVE NEGATIVE Final   Influenza B by PCR NEGATIVE NEGATIVE Final    Comment: (NOTE) The Xpert Xpress SARS-CoV-2/FLU/RSV plus assay is intended as an aid in the diagnosis of influenza from Nasopharyngeal swab specimens and should not be used as a sole basis for treatment. Nasal washings and aspirates are unacceptable for Xpert Xpress SARS-CoV-2/FLU/RSV testing.  Fact Sheet for  Patients: EntrepreneurPulse.com.au  Fact Sheet for Healthcare Providers: IncredibleEmployment.be  This test is not yet approved or cleared by the [place] FDA and has been authorized for detection and/or diagnosis of SARS-CoV-2 by FDA under an Emergency Use Authorization (EUA). This EUA will remain in effect (meaning this test can be used) for the duration of the COVID-19 declaration under Section 564(b)(1) of the Act, 21 U.S.C. section 360bbb-3(b)(1), unless the authorization is terminated or revoked.  Performed at [facility], [place] [address], [place], [place] [address]     Labs: CBC: Recent Labs  Lab 11/21/21 0454 11/22/21 0447  WBC 10.2 8.6  HGB 16.4* 14.4  HCT 51.9* 43.5  MCV 99.8 95.0  PLT 394 748   Basic Metabolic Panel: Recent Labs  Lab 11/21/21 0454 11/22/21 0447  NA 143 137  K 3.9 4.0  CL 113* 109  CO2 19* 19*  GLUCOSE 105* 97  BUN 10 9  CREATININE 0.70 0.60  CALCIUM 9.1 8.9  MG  --  2.0   Liver Function Tests: Recent Labs  Lab 11/21/21 0454 11/22/21 0447  AST 22 24  ALT 18 22  ALKPHOS 94 87  BILITOT 1.2 1.3*  PROT 8.4* 7.7  ALBUMIN 3.7 3.4*   CBG: Recent Labs  Lab 11/24/21 1734 11/24/21 2340 11/25/21 0623 11/25/21 1224 11/27/21 1148  GLUCAP 88 99 104* 90 113*    Discharge time spent: less than 30 minutes.  Signed: Murray Hodgkins, MD [facility] 11/27/2021

## 2021-11-27 NOTE — TOC Transition Note (Signed)
Transition of Care [facility]) - CM/SW Discharge Note   Patient Details  Name: Kristen Ramos MRN: [id number] Date of Birth: Jul 17, 1960  [facility] CM/SW Contact:  Pollie Friar, RN Phone Number: 11/27/2021, 2:29 PM   Clinical Narrative:    Pt discharging home with outpatient OT at [facility]. Information on the AVS.  CM spoke to patients daughter and she states the patient will have needed transportation to appointments.  Pt will have assistance and supervision at home per family.  Family providing transport home today.   Final next level of care: OP Rehab Barriers to Discharge: No Barriers Identified   Patient Goals and CMS Choice   CMS Medicare.gov Compare Post Acute Care list provided to:: Patient Represenative (must comment) Choice offered to / list presented to : Adult Children  Discharge Placement                       Discharge Plan and Services In-house Referral: Clinical Social Work Discharge Planning Services: CM Consult Post Acute Care Choice: Home Health, Durable Medical Equipment                               Social Determinants of Health (SDOH) Interventions     Readmission Risk Interventions     No data to display

## 2021-11-27 NOTE — Care Management Important Message (Signed)
Important Message  Patient Details  Name: Kristen Ramos MRN: [id number] Date of Birth: 1960/11/20   Medicare Important Message Given:  Yes     Hannah Beat 11/27/2021, 2:24 PM

## 2021-11-27 NOTE — Progress Notes (Signed)
Physical Therapy Treatment Patient Details Name: Kristen Ramos MRN: [id number] DOB: 01/25/1961 Today's Date: 11/27/2021   History of Present Illness 61 years old female with PMH significant for rheumatoid arthritis on methotrexate, history of AVN, cyclic vomiting syndrome, obesity, recent admission for [facility] in which she had seizure and PRES and chronic back pain who presented with vomiting and appeared to have confusion.    PT Comments    Pt much improved today from functional stand point. Pt remains mildly confused but was re-oriented to situation as pt didn't remember why she was in the hospital or how long she has been in the hospital. Pt mobilized with close supervision due to impulsivity, amb >200' without AD, and completed stair negotiation. Pt eager to be discharged home now. Pt remains to have mild cognitive impairments including decreased attn span, short term memory deficits, impaired sequencing, and processing. Pt safe to d/c home with 24/7 supervision. Spoke with dtr Kristen Ramos who verified pt would have 24/7 assist provided by multiple family members. Acute PT to cont to follow.    Recommendations for follow up therapy are one component of a multi-disciplinary discharge planning process, led by the attending physician.  Recommendations may be updated based on patient status, additional functional criteria and insurance authorization.  Follow Up Recommendations  No PT follow up Can patient physically be transported by private vehicle: Yes   Assistance Recommended at Discharge Frequent or constant Supervision/Assistance  Patient can return home with the following Assist for transportation;Assistance with cooking/housework   Equipment Recommendations  None recommended by PT    Recommendations for Other Services       Precautions / Restrictions Precautions Precautions: Fall Precaution Comments: seizure Restrictions Weight Bearing Restrictions: No     Mobility  Bed  Mobility Overal bed mobility: Modified Independent       Supine to sit: Modified independent (Device/Increase time)     General bed mobility comments: HOB elevated, used bed rail, no physical assist    Transfers Overall transfer level: Needs assistance Equipment used: None Transfers: Sit to/from Stand Sit to Stand: Min guard           General transfer comment: pt eager to mobilize, verbal cues to slow down due to impulsivity, stood x 5 min to urinate in purwidk and for pericare s/p urinating due to leaking around purwick    Ambulation/Gait Ambulation/Gait assistance: Min guard Gait Distance (Feet): 200 Feet Assistive device: None Gait Pattern/deviations: Step-through pattern Gait velocity: impulsively fast Gait velocity interpretation: >2.62 ft/sec, indicative of community ambulatory   General Gait Details: pt quick to move, min guard for safety due to first time ambulating in 7 days, pt quick to turn, appeared mildly unsteady initially however improved over time as pt hasn't amb in 7 days. pt with difficulty following directional commands "turn left at the intersection".   Stairs Stairs: Yes Stairs assistance: Min guard Stair Management: One rail Right, Step to pattern, Forwards Number of Stairs: 5 (x2) General stair comments: pt alternating between step to pattern and reciprocal pattern, used R hand rail to mimic home set up   Wheelchair Mobility    Modified Rankin (Stroke Patients Only)       Balance Overall balance assessment: Needs assistance Sitting-balance support: Feet supported Sitting balance-Leahy Scale: Good Sitting balance - Comments: on EOB no UE support, was able to don bilat socks at EOB without difficulty   Standing balance support: During functional activity Standing balance-Leahy Scale: Fair Standing balance comment: pt leaned on sink to  perform pericare                            Cognition Arousal/Alertness:  Awake/alert Behavior During Therapy: Impulsive Overall Cognitive Status: Impaired/Different from baseline Area of Impairment: Attention, Orientation, Following commands, Safety/judgement, Problem solving                 Orientation Level: Disoriented to, Time, Situation Current Attention Level: Focused (easily distracted)     Safety/Judgement: Decreased awareness of safety Awareness: Emergent Problem Solving: Difficulty sequencing, Slow processing General Comments: pt initially very irritated because she didn't have her pocketbook, found out it was at home, pt facetimed her sister who was at her home to verify, pt then in a much better mood and willing to cooperate. Pt unaware of situation and why she was in the hospital and how long, pt re-oriented and appreciated info, pt with difficulty processing multistep commands with noted short term memory deficits        Exercises      General Comments General comments (skin integrity, edema, etc.): VSS      Pertinent Vitals/Pain Pain Assessment Pain Assessment: No/denies pain    Home Living                          Prior Function            PT Goals (current goals can now be found in the care plan section) Acute Rehab PT Goals PT Goal Formulation: With patient/family Time For Goal Achievement: 12/06/21 Potential to Achieve Goals: Good Progress towards PT goals: Progressing toward goals    Frequency    Min 3X/week      PT Plan Discharge plan needs to be updated    Co-evaluation              AM-PAC PT "6 Clicks" Mobility   Outcome Measure  Help needed turning from your back to your side while in a flat bed without using bedrails?: None Help needed moving from lying on your back to sitting on the side of a flat bed without using bedrails?: None Help needed moving to and from a bed to a chair (including a wheelchair)?: A Little Help needed standing up from a chair using your arms (e.g.,  wheelchair or bedside chair)?: A Little Help needed to walk in hospital room?: A Little Help needed climbing 3-5 steps with a railing? : A Little 6 Click Score: 20    End of Session Equipment Utilized During Treatment: Gait belt Activity Tolerance: Patient limited by fatigue Patient left: in chair;with call bell/phone within reach;with chair alarm set Nurse Communication: Mobility status PT Visit Diagnosis: Other abnormalities of gait and mobility (R26.89);Muscle weakness (generalized) (M62.81);Other symptoms and signs involving the nervous system (R29.898)     Time: 3354-5625 PT Time Calculation (min) (ACUTE ONLY): 27 min  Charges:  $Gait Training: 8-22 mins $Therapeutic Activity: 8-22 mins                     Kristen Ramos, PT, DPT Acute Rehabilitation Services Secure chat preferred Office #: [phone]    Berline Lopes 11/27/2021, 11:55 AM

## 2021-11-30 ENCOUNTER — Ambulatory Visit: Payer: Medicare (Managed Care) | Attending: Student

## 2021-11-30 ENCOUNTER — Telehealth: Payer: Self-pay | Admitting: *Deleted

## 2021-11-30 DIAGNOSIS — R569 Unspecified convulsions: Secondary | ICD-10-CM

## 2021-11-30 DIAGNOSIS — R42 Dizziness and giddiness: Secondary | ICD-10-CM | POA: Diagnosis not present

## 2021-11-30 NOTE — Telephone Encounter (Signed)
Patient is returning call.  °

## 2021-11-30 NOTE — Telephone Encounter (Signed)
Explained to patient this test and follow up was requested by the hospital neurologist.  Patient was already aware, stating she had gone over to the Neurologist office yesterday.   Informed patient if she has any difficulties with the monitor she can call [employer] at [phone].  Their phone number is in the patient instruction manual and also listed inside the [employer] monitor box.

## 2021-11-30 NOTE — Telephone Encounter (Signed)
Returning call.  Please call Kristen Ramos in monitors at [phone].

## 2021-12-09 NOTE — Progress Notes (Deleted)
Cardiology Office Note   Date:  12/09/2021   ID:  Kristen Ramos, DOB 24-Aug-1960, MRN [id number]  PCP:  Mindi Curling, PA-C  Cardiologist:   None Referring:  ***  No chief complaint on file.     History of Present Illness: Kristen Ramos is a 61 y.o. female who is referred by *** for evaluation of  ***   She has worn a monitor.  ***     Past Medical History:  Diagnosis Date   Allergic rhinitis    Arthritis    Chronic back pain    GERD (gastroesophageal reflux disease)    RA (rheumatoid arthritis) ([place])     Past Surgical History:  Procedure Laterality Date   ABDOMINAL HYSTERECTOMY     BACK SURGERY     BIOPSY  04/19/2018   Procedure: BIOPSY;  Surgeon: Ronnette Juniper, MD;  Location: [facility];  Service: Gastroenterology;;   CESAREAN SECTION     ESOPHAGOGASTRODUODENOSCOPY (EGD) WITH PROPOFOL N/A 04/19/2018   Procedure: ESOPHAGOGASTRODUODENOSCOPY (EGD) WITH PROPOFOL;  Surgeon: Ronnette Juniper, MD;  Location: [facility];  Service: Gastroenterology;  Laterality: N/A;     Current Outpatient Medications  Medication Sig Dispense Refill   benzonatate (TESSALON) 100 MG capsule Take 1-2 capsules (100-200 mg total) by mouth 3 (three) times daily as needed for cough. (Patient taking differently: Take 100 mg by mouth 3 (three) times daily as needed for cough.) 30 capsule 0   Cholecalciferol (VITAMIN D-3 PO) Take 1 capsule by mouth daily.     folic acid (FOLVITE) 1 MG tablet Take 1 mg by mouth daily.     HORIZANT 600 MG TBCR Take 600 mg by mouth 2 (two) times daily.     lacosamide (VIMPAT) 50 MG TABS tablet Take 1 tablet (50 mg total) by mouth 2 (two) times daily. 60 tablet 2   lidocaine (LIDODERM) 5 % Place 1 patch onto the skin daily as needed. Apply patch to area most significant pain once per day.  Remove and discard patch within 12 hours of application. (Patient taking differently: Place 1 patch onto the skin daily as needed (pain).) 15 patch 0   meclizine (ANTIVERT) 25 MG tablet  Take 1 tablet (25 mg total) by mouth 3 (three) times daily as needed for dizziness. 30 tablet 0   ondansetron (ZOFRAN-ODT) 4 MG disintegrating tablet Take 4-8 mg by mouth in the morning, at noon, and at bedtime.     oxyCODONE (ROXICODONE) 15 MG immediate release tablet Take 1 tablet (15 mg total) by mouth 4 (four) times daily as needed for pain. (Patient taking differently: Take 15 mg by mouth in the morning, at noon, in the evening, and at bedtime.) 15 tablet 0   pantoprazole (PROTONIX) 40 MG tablet Take 1 tablet (40 mg total) by mouth daily. (Patient not taking: Reported on 11/20/2021) 30 tablet 0   polyethylene glycol (MIRALAX / GLYCOLAX) 17 g packet Take 17 g by mouth daily. (Patient taking differently: Take 17 g by mouth daily as needed for mild constipation.) 14 each 0   No current facility-administered medications for this visit.    Allergies:   Bee venom    Social History:  The patient  reports that she has been smoking cigarettes. She has never used smokeless tobacco. She reports that she does not drink alcohol and does not use drugs.   Family History:  The patient's ***family history is not on file.    ROS:  Please see the history of present illness.  Otherwise, review of systems are positive for {NONE DEFAULTED:18576}.   All other systems are reviewed and negative.    PHYSICAL EXAM: VS:  There were no vitals taken for this visit. , BMI There is no height or weight on file to calculate BMI. GENERAL:  Well appearing HEENT:  Pupils equal round and reactive, fundi not visualized, oral mucosa unremarkable NECK:  No jugular venous distention, waveform within normal limits, carotid upstroke brisk and symmetric, no bruits, no thyromegaly LYMPHATICS:  No cervical, inguinal adenopathy LUNGS:  Clear to auscultation bilaterally BACK:  No CVA tenderness CHEST:  Unremarkable HEART:  PMI not displaced or sustained,S1 and S2 within normal limits, no S3, no S4, no clicks, no rubs, ***  murmurs ABD:  Flat, positive bowel sounds normal in frequency in pitch, no bruits, no rebound, no guarding, no midline pulsatile mass, no hepatomegaly, no splenomegaly EXT:  2 plus pulses throughout, no edema, no cyanosis no clubbing SKIN:  No rashes no nodules NEURO:  Cranial nerves II through XII grossly intact, motor grossly intact throughout PSYCH:  Cognitively intact, oriented to person place and time    EKG:  EKG {ACTION; IS/IS EHO:[id number]} ordered today. The ekg ordered today demonstrates ***   Recent Labs: 11/22/2021: ALT 22; BUN 9; Creatinine, Ser 0.60; Hemoglobin 14.4; Magnesium 2.0; Platelets 393; Potassium 4.0; Sodium 137    Lipid Panel    Component Value Date/Time   CHOL 259 (H) 07/16/2018 0234   TRIG 70 07/16/2018 0234   HDL 60 07/16/2018 0234   CHOLHDL 4.3 07/16/2018 0234   VLDL 14 07/16/2018 0234   LDLCALC 185 (H) 07/16/2018 0234      Wt Readings from Last 3 Encounters:  11/23/21 189 lb 9.5 oz (86 kg)  10/29/21 208 lb 15.9 oz (94.8 kg)  07/23/21 197 lb 1.5 oz (89.4 kg)      Other studies Reviewed: Additional studies/ records that were reviewed today include: ***. Review of the above records demonstrates:  Please see elsewhere in the note.  ***   ASSESSMENT AND PLAN:  ***   Current medicines are reviewed at length with the patient today.  The patient {ACTIONS; HAS/DOES NOT HAVE:19233} concerns regarding medicines.  The following changes have been made:  {PLAN; NO CHANGE:13088:s}  Labs/ tests ordered today include: *** No orders of the defined types were placed in this encounter.    Disposition:   FU with ***    Signed, Rollene Rotunda, MD  12/09/2021 3:19 PM    [facility]

## 2021-12-12 ENCOUNTER — Ambulatory Visit: Payer: Medicare (Managed Care) | Admitting: Cardiology

## 2021-12-13 ENCOUNTER — Other Ambulatory Visit: Payer: Self-pay | Admitting: Physician Assistant

## 2021-12-13 DIAGNOSIS — Z1231 Encounter for screening mammogram for malignant neoplasm of breast: Secondary | ICD-10-CM

## 2021-12-26 ENCOUNTER — Telehealth: Payer: Self-pay | Admitting: Cardiology

## 2021-12-26 NOTE — Telephone Encounter (Signed)
[employer] called to report a critical ekg report

## 2021-12-26 NOTE — Telephone Encounter (Signed)
Received a call from [facility] with [employer] calling to report patient pressed the alert button this morning at 11:00 am saying she passed out.EKG revealed sinus rhythm rate 97.She will fax to office. Spoke to patient stated she pressed button by accident.She did not pass out.Stated she has one more day to wear monitor.She will mail back tomorrow.She will keep appointment with Dr.Hochrein 10/24 at 2:20 pm.Directions given to office.

## 2021-12-30 NOTE — Progress Notes (Deleted)
Cardiology Office Note   Date:  12/30/2021   ID:  Kristen Ramos, DOB 10/28/1960, MRN [id number]  PCP:  Mindi Curling, PA-C  Cardiologist:   None Referring:  ***  No chief complaint on file.     History of Present Illness: Kristen Ramos is a 61 y.o. female who is referred by *** for evaluation of  syncope   She has worn a monitor.  ***    She did call and report that she had syncope while wearing the monitor.  There were no arrhythmias noted.  ***    Past Medical History:  Diagnosis Date   Allergic rhinitis    Arthritis    Chronic back pain    GERD (gastroesophageal reflux disease)    RA (rheumatoid arthritis) ([place])     Past Surgical History:  Procedure Laterality Date   ABDOMINAL HYSTERECTOMY     BACK SURGERY     BIOPSY  04/19/2018   Procedure: BIOPSY;  Surgeon: Ronnette Juniper, MD;  Location: [facility];  Service: Gastroenterology;;   CESAREAN SECTION     ESOPHAGOGASTRODUODENOSCOPY (EGD) WITH PROPOFOL N/A 04/19/2018   Procedure: ESOPHAGOGASTRODUODENOSCOPY (EGD) WITH PROPOFOL;  Surgeon: Ronnette Juniper, MD;  Location: [facility];  Service: Gastroenterology;  Laterality: N/A;     Current Outpatient Medications  Medication Sig Dispense Refill   benzonatate (TESSALON) 100 MG capsule Take 1-2 capsules (100-200 mg total) by mouth 3 (three) times daily as needed for cough. (Patient taking differently: Take 100 mg by mouth 3 (three) times daily as needed for cough.) 30 capsule 0   Cholecalciferol (VITAMIN D-3 PO) Take 1 capsule by mouth daily.     folic acid (FOLVITE) 1 MG tablet Take 1 mg by mouth daily.     HORIZANT 600 MG TBCR Take 600 mg by mouth 2 (two) times daily.     lacosamide (VIMPAT) 50 MG TABS tablet Take 1 tablet (50 mg total) by mouth 2 (two) times daily. 60 tablet 2   lidocaine (LIDODERM) 5 % Place 1 patch onto the skin daily as needed. Apply patch to area most significant pain once per day.  Remove and discard patch within 12 hours of application. (Patient  taking differently: Place 1 patch onto the skin daily as needed (pain).) 15 patch 0   meclizine (ANTIVERT) 25 MG tablet Take 1 tablet (25 mg total) by mouth 3 (three) times daily as needed for dizziness. 30 tablet 0   ondansetron (ZOFRAN-ODT) 4 MG disintegrating tablet Take 4-8 mg by mouth in the morning, at noon, and at bedtime.     oxyCODONE (ROXICODONE) 15 MG immediate release tablet Take 1 tablet (15 mg total) by mouth 4 (four) times daily as needed for pain. (Patient taking differently: Take 15 mg by mouth in the morning, at noon, in the evening, and at bedtime.) 15 tablet 0   pantoprazole (PROTONIX) 40 MG tablet Take 1 tablet (40 mg total) by mouth daily. (Patient not taking: Reported on 11/20/2021) 30 tablet 0   polyethylene glycol (MIRALAX / GLYCOLAX) 17 g packet Take 17 g by mouth daily. (Patient taking differently: Take 17 g by mouth daily as needed for mild constipation.) 14 each 0   No current facility-administered medications for this visit.    Allergies:   Bee venom    Social History:  The patient  reports that she has been smoking cigarettes. She has never used smokeless tobacco. She reports that she does not drink alcohol and does not use drugs.  Family History:  The patient's ***family history is not on file.    ROS:  Please see the history of present illness.   Otherwise, review of systems are positive for {NONE DEFAULTED:18576}.   All other systems are reviewed and negative.    PHYSICAL EXAM: VS:  There were no vitals taken for this visit. , BMI There is no height or weight on file to calculate BMI. GENERAL:  Well appearing HEENT:  Pupils equal round and reactive, fundi not visualized, oral mucosa unremarkable NECK:  No jugular venous distention, waveform within normal limits, carotid upstroke brisk and symmetric, no bruits, no thyromegaly LYMPHATICS:  No cervical, inguinal adenopathy LUNGS:  Clear to auscultation bilaterally BACK:  No CVA tenderness CHEST:   Unremarkable HEART:  PMI not displaced or sustained,S1 and S2 within normal limits, no S3, no S4, no clicks, no rubs, *** murmurs ABD:  Flat, positive bowel sounds normal in frequency in pitch, no bruits, no rebound, no guarding, no midline pulsatile mass, no hepatomegaly, no splenomegaly EXT:  2 plus pulses throughout, no edema, no cyanosis no clubbing SKIN:  No rashes no nodules NEURO:  Cranial nerves II through XII grossly intact, motor grossly intact throughout PSYCH:  Cognitively intact, oriented to person place and time    EKG:  EKG {ACTION; IS/IS GI:[id number] ordered today. The ekg ordered today demonstrates ***   Recent Labs: 11/22/2021: ALT 22; BUN 9; Creatinine, Ser 0.60; Hemoglobin 14.4; Magnesium 2.0; Platelets 393; Potassium 4.0; Sodium 137    Lipid Panel    Component Value Date/Time   CHOL 259 (H) 07/16/2018 0234   TRIG 70 07/16/2018 0234   HDL 60 07/16/2018 0234   CHOLHDL 4.3 07/16/2018 0234   VLDL 14 07/16/2018 0234   LDLCALC 185 (H) 07/16/2018 0234      Wt Readings from Last 3 Encounters:  11/23/21 189 lb 9.5 oz (86 kg)  10/29/21 208 lb 15.9 oz (94.8 kg)  07/23/21 197 lb 1.5 oz (89.4 kg)      Other studies Reviewed: Additional studies/ records that were reviewed today include: ***. Review of the above records demonstrates:  Please see elsewhere in the note.  ***   ASSESSMENT AND PLAN:  Syncope:  ***   Current medicines are reviewed at length with the patient today.  The patient {ACTIONS; HAS/DOES NOT HAVE:19233} concerns regarding medicines.  The following changes have been made:  {PLAN; NO CHANGE:13088:s}  Labs/ tests ordered today include: *** No orders of the defined types were placed in this encounter.    Disposition:   FU with ***    Signed, Minus Breeding, MD  12/30/2021 8:58 PM    [place]

## 2022-01-01 ENCOUNTER — Telehealth: Payer: Self-pay | Admitting: Cardiology

## 2022-01-01 NOTE — Telephone Encounter (Signed)
Pt wants to know if she should keep her appt tomorrow or r/s because she hasnt sent her heart monitor back yet.

## 2022-01-01 NOTE — Telephone Encounter (Signed)
Returned  call to pt, she states that her monitor has not been picked up and she thinks that she should cancel her appt for tomorrow. Rescheduled 10-24 appt to 11-3 with [facility]. Verbalized understanding.

## 2022-01-02 ENCOUNTER — Ambulatory Visit: Payer: Medicare (Managed Care) | Admitting: Cardiology

## 2022-01-02 DIAGNOSIS — R55 Syncope and collapse: Secondary | ICD-10-CM

## 2022-01-03 ENCOUNTER — Telehealth: Payer: Self-pay | Admitting: Gastroenterology

## 2022-01-03 ENCOUNTER — Ambulatory Visit: Payer: Medicare (Managed Care) | Admitting: Occupational Therapy

## 2022-01-03 NOTE — Telephone Encounter (Signed)
Request received to transfer GI care from outside practice to [facility].  We appreciate the interest in our practice, however at this time due to high demand from patients without established GI providers we cannot accommodate this transfer.      

## 2022-01-03 NOTE — Telephone Encounter (Signed)
Good Morning Dr.Nandigam,  Supervising MD 10/19 AM  We received a referral on this patient to be seen for GERD. She has GI hx at [facility]. She is requesting a transfer as her dr, Dr. Derrill Kay has retired and she would like something closer to her home.   Records are available for review in Epic, Care Everywhere. Please advise on scheduling. Thank you.

## 2022-01-04 DIAGNOSIS — Z1231 Encounter for screening mammogram for malignant neoplasm of breast: Secondary | ICD-10-CM

## 2022-01-08 NOTE — Telephone Encounter (Signed)
Patient has been made aware of decision.  

## 2022-01-11 ENCOUNTER — Ambulatory Visit: Payer: Medicare (Managed Care) | Admitting: Student

## 2022-01-11 DIAGNOSIS — R55 Syncope and collapse: Secondary | ICD-10-CM | POA: Insufficient documentation

## 2022-01-11 NOTE — Progress Notes (Deleted)
Cardiology Office Note   Date:  01/11/2022   ID:  Kristen Ramos, DOB 1960-09-18, MRN [id number]  PCP:  Mindi Curling, PA-C  Cardiologist:   None Referring:  ***  No chief complaint on file.     History of Present Illness: Kristen Ramos is a 61 y.o. female who is referred by *** for evaluation of  syncope   She has worn a monitor.  ***    She did call and report that she had syncope while wearing the monitor.  There were no arrhythmias noted.  ***    Past Medical History:  Diagnosis Date   Allergic rhinitis    Arthritis    Chronic back pain    GERD (gastroesophageal reflux disease)    RA (rheumatoid arthritis) ([place])     Past Surgical History:  Procedure Laterality Date   ABDOMINAL HYSTERECTOMY     BACK SURGERY     BIOPSY  04/19/2018   Procedure: BIOPSY;  Surgeon: Ronnette Juniper, MD;  Location: [facility];  Service: Gastroenterology;;   CESAREAN SECTION     ESOPHAGOGASTRODUODENOSCOPY (EGD) WITH PROPOFOL N/A 04/19/2018   Procedure: ESOPHAGOGASTRODUODENOSCOPY (EGD) WITH PROPOFOL;  Surgeon: Ronnette Juniper, MD;  Location: [facility];  Service: Gastroenterology;  Laterality: N/A;     Current Outpatient Medications  Medication Sig Dispense Refill   benzonatate (TESSALON) 100 MG capsule Take 1-2 capsules (100-200 mg total) by mouth 3 (three) times daily as needed for cough. (Patient taking differently: Take 100 mg by mouth 3 (three) times daily as needed for cough.) 30 capsule 0   Cholecalciferol (VITAMIN D-3 PO) Take 1 capsule by mouth daily.     folic acid (FOLVITE) 1 MG tablet Take 1 mg by mouth daily.     HORIZANT 600 MG TBCR Take 600 mg by mouth 2 (two) times daily.     lacosamide (VIMPAT) 50 MG TABS tablet Take 1 tablet (50 mg total) by mouth 2 (two) times daily. 60 tablet 2   lidocaine (LIDODERM) 5 % Place 1 patch onto the skin daily as needed. Apply patch to area most significant pain once per day.  Remove and discard patch within 12 hours of application. (Patient  taking differently: Place 1 patch onto the skin daily as needed (pain).) 15 patch 0   meclizine (ANTIVERT) 25 MG tablet Take 1 tablet (25 mg total) by mouth 3 (three) times daily as needed for dizziness. 30 tablet 0   ondansetron (ZOFRAN-ODT) 4 MG disintegrating tablet Take 4-8 mg by mouth in the morning, at noon, and at bedtime.     oxyCODONE (ROXICODONE) 15 MG immediate release tablet Take 1 tablet (15 mg total) by mouth 4 (four) times daily as needed for pain. (Patient taking differently: Take 15 mg by mouth in the morning, at noon, in the evening, and at bedtime.) 15 tablet 0   pantoprazole (PROTONIX) 40 MG tablet Take 1 tablet (40 mg total) by mouth daily. (Patient not taking: Reported on 11/20/2021) 30 tablet 0   polyethylene glycol (MIRALAX / GLYCOLAX) 17 g packet Take 17 g by mouth daily. (Patient taking differently: Take 17 g by mouth daily as needed for mild constipation.) 14 each 0   No current facility-administered medications for this visit.    Allergies:   Bee venom    Social History:  The patient  reports that she has been smoking cigarettes. She has never used smokeless tobacco. She reports that she does not drink alcohol and does not use drugs.  Family History:  The patient's ***family history is not on file.    ROS:  Please see the history of present illness.   Otherwise, review of systems are positive for {NONE DEFAULTED:18576}.   All other systems are reviewed and negative.    PHYSICAL EXAM: VS:  There were no vitals taken for this visit. , BMI There is no height or weight on file to calculate BMI. GENERAL:  Well appearing HEENT:  Pupils equal round and reactive, fundi not visualized, oral mucosa unremarkable NECK:  No jugular venous distention, waveform within normal limits, carotid upstroke brisk and symmetric, no bruits, no thyromegaly LYMPHATICS:  No cervical, inguinal adenopathy LUNGS:  Clear to auscultation bilaterally BACK:  No CVA tenderness CHEST:   Unremarkable HEART:  PMI not displaced or sustained,S1 and S2 within normal limits, no S3, no S4, no clicks, no rubs, *** murmurs ABD:  Flat, positive bowel sounds normal in frequency in pitch, no bruits, no rebound, no guarding, no midline pulsatile mass, no hepatomegaly, no splenomegaly EXT:  2 plus pulses throughout, no edema, no cyanosis no clubbing SKIN:  No rashes no nodules NEURO:  Cranial nerves II through XII grossly intact, motor grossly intact throughout PSYCH:  Cognitively intact, oriented to person place and time    EKG:  EKG {ACTION; IS/IS GI:087931 ordered today. The ekg ordered today demonstrates ***   Recent Labs: 11/22/2021: ALT 22; BUN 9; Creatinine, Ser 0.60; Hemoglobin 14.4; Magnesium 2.0; Platelets 393; Potassium 4.0; Sodium 137    Lipid Panel    Component Value Date/Time   CHOL 259 (H) 07/16/2018 0234   TRIG 70 07/16/2018 0234   HDL 60 07/16/2018 0234   CHOLHDL 4.3 07/16/2018 0234   VLDL 14 07/16/2018 0234   LDLCALC 185 (H) 07/16/2018 0234      Wt Readings from Last 3 Encounters:  11/23/21 189 lb 9.5 oz (86 kg)  10/29/21 208 lb 15.9 oz (94.8 kg)  07/23/21 197 lb 1.5 oz (89.4 kg)      Other studies Reviewed: Additional studies/ records that were reviewed today include: ***. Review of the above records demonstrates:  Please see elsewhere in the note.  ***   ASSESSMENT AND PLAN:  Syncope:  ***   Current medicines are reviewed at length with the patient today.  The patient {ACTIONS; HAS/DOES NOT HAVE:19233} concerns regarding medicines.  The following changes have been made:  {PLAN; NO CHANGE:13088:s}  Labs/ tests ordered today include: *** No orders of the defined types were placed in this encounter.    Disposition:   FU with ***    Signed, Minus Breeding, MD  01/11/2022 8:24 PM    [name]

## 2022-01-12 ENCOUNTER — Ambulatory Visit: Payer: Medicare (Managed Care) | Attending: Cardiology | Admitting: Cardiology

## 2022-01-12 DIAGNOSIS — R55 Syncope and collapse: Secondary | ICD-10-CM

## 2022-01-16 ENCOUNTER — Ambulatory Visit: Payer: Medicare (Managed Care) | Admitting: Occupational Therapy

## 2022-01-21 ENCOUNTER — Inpatient Hospital Stay (HOSPITAL_COMMUNITY)
Admission: EM | Admit: 2022-01-21 | Discharge: 2022-01-25 | DRG: 394 | Disposition: A | Discharge status: Home / Self Care | Payer: Medicare (Managed Care) | Attending: Internal Medicine | Admitting: Internal Medicine

## 2022-01-21 ENCOUNTER — Encounter (HOSPITAL_COMMUNITY): Payer: Self-pay | Admitting: Emergency Medicine

## 2022-01-21 ENCOUNTER — Emergency Department (HOSPITAL_COMMUNITY): Payer: Medicare (Managed Care)

## 2022-01-21 ENCOUNTER — Other Ambulatory Visit: Payer: Self-pay

## 2022-01-21 DIAGNOSIS — Z91030 Bee allergy status: Secondary | ICD-10-CM

## 2022-01-21 DIAGNOSIS — R1013 Epigastric pain: Secondary | ICD-10-CM | POA: Diagnosis present

## 2022-01-21 DIAGNOSIS — I6783 Posterior reversible encephalopathy syndrome: Secondary | ICD-10-CM | POA: Diagnosis present

## 2022-01-21 DIAGNOSIS — R8290 Unspecified abnormal findings in urine: Secondary | ICD-10-CM | POA: Diagnosis present

## 2022-01-21 DIAGNOSIS — R112 Nausea with vomiting, unspecified: Principal | ICD-10-CM | POA: Diagnosis present

## 2022-01-21 DIAGNOSIS — M48061 Spinal stenosis, lumbar region without neurogenic claudication: Secondary | ICD-10-CM | POA: Diagnosis present

## 2022-01-21 DIAGNOSIS — R1115 Cyclical vomiting syndrome unrelated to migraine: Secondary | ICD-10-CM | POA: Diagnosis not present

## 2022-01-21 DIAGNOSIS — G8929 Other chronic pain: Secondary | ICD-10-CM | POA: Diagnosis present

## 2022-01-21 DIAGNOSIS — G40909 Epilepsy, unspecified, not intractable, without status epilepticus: Secondary | ICD-10-CM

## 2022-01-21 DIAGNOSIS — E872 Acidosis, unspecified: Secondary | ICD-10-CM | POA: Insufficient documentation

## 2022-01-21 DIAGNOSIS — E876 Hypokalemia: Secondary | ICD-10-CM | POA: Diagnosis present

## 2022-01-21 DIAGNOSIS — Z1152 Encounter for screening for COVID-19: Secondary | ICD-10-CM

## 2022-01-21 DIAGNOSIS — M1990 Unspecified osteoarthritis, unspecified site: Secondary | ICD-10-CM | POA: Diagnosis present

## 2022-01-21 DIAGNOSIS — M069 Rheumatoid arthritis, unspecified: Secondary | ICD-10-CM | POA: Diagnosis present

## 2022-01-21 DIAGNOSIS — K219 Gastro-esophageal reflux disease without esophagitis: Secondary | ICD-10-CM | POA: Diagnosis present

## 2022-01-21 DIAGNOSIS — Z79899 Other long term (current) drug therapy: Secondary | ICD-10-CM

## 2022-01-21 DIAGNOSIS — R030 Elevated blood-pressure reading, without diagnosis of hypertension: Secondary | ICD-10-CM | POA: Diagnosis present

## 2022-01-21 LAB — I-STAT CHEM 8, ED
BUN: 10 mg/dL (ref 6–20)
Calcium, Ion: 1.1 mmol/L — ABNORMAL LOW (ref 1.15–1.40)
Chloride: 111 mmol/L (ref 98–111)
Creatinine, Ser: 0.7 mg/dL (ref 0.44–1.00)
Glucose, Bld: 117 mg/dL — ABNORMAL HIGH (ref 70–99)
HCT: 44 % (ref 36.0–46.0)
Hemoglobin: 15 g/dL (ref 12.0–15.0)
Potassium: 3.8 mmol/L (ref 3.5–5.1)
Sodium: 142 mmol/L (ref 135–145)
TCO2: 19 mmol/L — ABNORMAL LOW (ref 22–32)

## 2022-01-21 LAB — URINALYSIS, ROUTINE W REFLEX MICROSCOPIC
Bilirubin Urine: NEGATIVE
Glucose, UA: NEGATIVE mg/dL
Hgb urine dipstick: NEGATIVE
Ketones, ur: 5 mg/dL — AB
Nitrite: NEGATIVE
Protein, ur: 30 mg/dL — AB
Specific Gravity, Urine: 1.033 — ABNORMAL HIGH (ref 1.005–1.030)
pH: 5 (ref 5.0–8.0)

## 2022-01-21 LAB — COMPREHENSIVE METABOLIC PANEL
ALT: 20 U/L (ref 0–44)
AST: 27 U/L (ref 15–41)
Albumin: 3.4 g/dL — ABNORMAL LOW (ref 3.5–5.0)
Alkaline Phosphatase: 88 U/L (ref 38–126)
Anion gap: 15 (ref 5–15)
BUN: 9 mg/dL (ref 6–20)
CO2: 17 mmol/L — ABNORMAL LOW (ref 22–32)
Calcium: 9.7 mg/dL (ref 8.9–10.3)
Chloride: 108 mmol/L (ref 98–111)
Creatinine, Ser: 0.97 mg/dL (ref 0.44–1.00)
GFR, Estimated: >60 mL/min (ref >60)
Glucose, Bld: 115 mg/dL — ABNORMAL HIGH (ref 70–99)
Potassium: 3.6 mmol/L (ref 3.5–5.1)
Sodium: 140 mmol/L (ref 135–145)
Total Bilirubin: 0.6 mg/dL (ref 0.3–1.2)
Total Protein: 9.2 g/dL — ABNORMAL HIGH (ref 6.5–8.1)

## 2022-01-21 LAB — CBC WITH DIFFERENTIAL/PLATELET
Abs Immature Granulocytes: 0.02 10*3/uL (ref 0.00–0.07)
Basophils Absolute: 0.1 10*3/uL (ref 0.0–0.1)
Basophils Relative: 1 %
Eosinophils Absolute: 0.1 10*3/uL (ref 0.0–0.5)
Eosinophils Relative: 1 %
HCT: 43.6 % (ref 36.0–46.0)
Hemoglobin: 14.6 g/dL (ref 12.0–15.0)
Immature Granulocytes: 0 %
Lymphocytes Relative: 25 %
Lymphs Abs: 1.9 10*3/uL (ref 0.7–4.0)
MCH: 29.7 pg (ref 26.0–34.0)
MCHC: 33.5 g/dL (ref 30.0–36.0)
MCV: 88.6 fL (ref 80.0–100.0)
Monocytes Absolute: 0.4 10*3/uL (ref 0.1–1.0)
Monocytes Relative: 5 %
Neutro Abs: 5.1 10*3/uL (ref 1.7–7.7)
Neutrophils Relative %: 68 %
Platelets: 443 10*3/uL — ABNORMAL HIGH (ref 150–400)
RBC: 4.92 MIL/uL (ref 3.87–5.11)
RDW: 13.3 % (ref 11.5–15.5)
WBC: 7.6 10*3/uL (ref 4.0–10.5)
nRBC: 0 % (ref 0.0–0.2)

## 2022-01-21 LAB — TROPONIN I (HIGH SENSITIVITY): Troponin I (High Sensitivity): <2 ng/L (ref <18)

## 2022-01-21 LAB — AMMONIA: Ammonia: 16 umol/L (ref 9–35)

## 2022-01-21 MED ORDER — SODIUM CHLORIDE 0.9 % IV SOLN
50.0000 mg | Freq: Once | INTRAVENOUS | Status: AC
  Administered 2022-01-21: 50 mg via INTRAVENOUS
  Filled 2022-01-21: qty 5

## 2022-01-21 MED ORDER — ACETAMINOPHEN 10 MG/ML IV SOLN
1000.0000 mg | Freq: Once | INTRAVENOUS | Status: AC
  Administered 2022-01-21: 1000 mg via INTRAVENOUS
  Filled 2022-01-21: qty 100

## 2022-01-21 MED ORDER — SODIUM CHLORIDE 0.9 % IV SOLN: 1.0000 g | Freq: Once | INTRAVENOUS | Status: DC

## 2022-01-21 MED ORDER — ONDANSETRON HCL 4 MG/2ML IJ SOLN
4.0000 mg | Freq: Once | INTRAMUSCULAR | Status: AC
  Administered 2022-01-21: 4 mg via INTRAVENOUS
  Filled 2022-01-21: qty 2

## 2022-01-21 MED ORDER — ONDANSETRON HCL 4 MG/2ML IJ SOLN: 4.0000 mg | Freq: Once | INTRAMUSCULAR | Status: DC

## 2022-01-21 MED ORDER — METOCLOPRAMIDE HCL 5 MG/ML IJ SOLN: 10.0000 mg | Freq: Once | INTRAMUSCULAR | Status: DC

## 2022-01-21 MED ORDER — SODIUM CHLORIDE 0.9 % IV BOLUS
1000.0000 mL | Freq: Once | INTRAVENOUS | Status: AC
  Administered 2022-01-22: 1000 mL via INTRAVENOUS

## 2022-01-21 MED ORDER — DROPERIDOL 2.5 MG/ML IJ SOLN
1.2500 mg | Freq: Once | INTRAMUSCULAR | Status: AC
  Administered 2022-01-21: 1.25 mg via INTRAVENOUS
  Filled 2022-01-21: qty 2

## 2022-01-21 MED ORDER — LACTATED RINGERS IV BOLUS
1000.0000 mL | Freq: Once | INTRAVENOUS | Status: AC
  Administered 2022-01-21: 1000 mL via INTRAVENOUS

## 2022-01-21 MED ORDER — METOCLOPRAMIDE HCL 5 MG/ML IJ SOLN
10.0000 mg | Freq: Once | INTRAMUSCULAR | Status: AC
  Administered 2022-01-21: 10 mg via INTRAMUSCULAR
  Filled 2022-01-21: qty 2

## 2022-01-21 MED ORDER — IOHEXOL 350 MG/ML SOLN
75.0000 mL | Freq: Once | INTRAVENOUS | Status: AC | PRN
  Administered 2022-01-21: 75 mL via INTRAVENOUS

## 2022-01-21 MED ORDER — DIPHENHYDRAMINE HCL 50 MG/ML IJ SOLN
25.0000 mg | Freq: Once | INTRAMUSCULAR | Status: AC
  Administered 2022-01-21: 25 mg via INTRAVENOUS
  Filled 2022-01-21: qty 1

## 2022-01-21 NOTE — ED Triage Notes (Signed)
Patient arrived with family reports emesis this evening with altered LOC , headache for several days .

## 2022-01-21 NOTE — ED Notes (Signed)
Patient transported to CT 

## 2022-01-21 NOTE — ED Provider Triage Note (Signed)
Emergency Medicine Provider Triage Evaluation Note  Kristen Ramos , a 61 y.o. female  was evaluated in triage.  Pt complains of vomiting and AMS.  History is given by the patient's daughter.  She has a past medical history of cyclic vomiting.  She has been admitted in the past with similar symptoms and has had acute onset encephalopathy, hyperammonemia of unknown etiology.  She is followed by Dr.Nandigam.  She had 1 episode of vomiting followed by persistent vomiting and worsening confusion this evening, took Zofran without relief of her symptoms.  She had a seizure last time that she was admitted underwent multiple tests and has a history of press syndrome.  Patient is no longer taking methotrexate for her RA.  Review of Systems  Positive: confusion Negative: fever  Physical Exam  BP 129/77   Pulse 98   Temp (!) 97.2 F (36.2 C) (Oral)   Resp 20   SpO2 95%  Gen:   Awake writhing around in the bed, poor eye contact Resp:  Tachypnea MSK:   Moves extremities without difficulty  Other:  Altered, disoriented and confused  Medical Decision Making  Medically screening exam initiated at 7:53 PM.  Appropriate orders placed.  Keeley Sussman was informed that the remainder of the evaluation will be completed by another provider, this initial triage assessment does not replace that evaluation, and the importance of remaining in the ED until their evaluation is complete.  Work-up initiated   Arthor Captain, PA-C 01/21/22 1959

## 2022-01-21 NOTE — ED Provider Notes (Signed)
[facility] EMERGENCY DEPARTMENT Provider Note   CSN: [id number] Arrival date & time: 01/21/22  1912     History {Add pertinent medical, surgical, social history, OB history to HPI:1} Chief Complaint  Patient presents with   Emesis / Altered LOC     Kristen Ramos is a 61 year old female with history of PRES, seizure, lumbar stenosis, cyclic vomiting syndrome who presents to the ED for abdominal pain, emesis, and AMS. The patient's daughter reports that she started complaining of not feeling well just today.  She stated that her bones hurt, but that is at her baseline with her rheumatoid arthritis.  She was newly complaining of abdominal pain however, as well as decreased appetite and hardly ate or drink anything today.  She was able to keep a little bit of food down with her daughter around dinnertime, but then around 7 PM she started rocking back and forth complaining of abdominal pain/tightness, vomited, and then approximately an hour and a half ago she became more altered than normal.  The daughter states that this happened in the past when her ammonia levels were high.  She has been having daily bowel movements still, last one was loose yesterday.  The history is provided by the patient, a relative and medical records.       Home Medications Prior to Admission medications   Medication Sig Start Date End Date Taking? Authorizing Provider  benzonatate (TESSALON) 100 MG capsule Take 1-2 capsules (100-200 mg total) by mouth 3 (three) times daily as needed for cough. Patient taking differently: Take 100 mg by mouth 3 (three) times daily as needed for cough. 11/18/21   Petrucelli, Samantha R, PA-C  Cholecalciferol (VITAMIN D-3 PO) Take 1 capsule by mouth daily.    [provider]  folic acid (FOLVITE) 1 MG tablet Take 1 mg by mouth daily. 04/01/19   [provider]  HORIZANT 600 MG TBCR Take 600 mg by mouth 2 (two) times daily. 10/30/21   [provider]  lacosamide (VIMPAT) 50 MG TABS tablet Take 1 tablet (50 mg total) by mouth 2 (two) times daily. 11/27/21   Standley Brooking, MD  lidocaine (LIDODERM) 5 % Place 1 patch onto the skin daily as needed. Apply patch to area most significant pain once per day.  Remove and discard patch within 12 hours of application. Patient taking differently: Place 1 patch onto the skin daily as needed (pain). 11/18/21   Petrucelli, Pleas Koch, PA-C  meclizine (ANTIVERT) 25 MG tablet Take 1 tablet (25 mg total) by mouth 3 (three) times daily as needed for dizziness. 10/20/21   Derwood Kaplan, MD  ondansetron (ZOFRAN-ODT) 4 MG disintegrating tablet Take 4-8 mg by mouth in the morning, at noon, and at bedtime. 10/18/21   [provider]  oxyCODONE (ROXICODONE) 15 MG immediate release tablet Take 1 tablet (15 mg total) by mouth 4 (four) times daily as needed for pain. Patient taking differently: Take 15 mg by mouth in the morning, at noon, in the evening, and at bedtime. 10/27/21   Rhetta Mura, MD  pantoprazole (PROTONIX) 40 MG tablet Take 1 tablet (40 mg total) by mouth daily. Patient not taking: Reported on 11/20/2021 06/22/19   Marguerita Merles Latif, DO  polyethylene glycol (MIRALAX / GLYCOLAX) 17 g packet Take 17 g by mouth daily. Patient taking differently: Take 17 g by mouth daily as needed for mild constipation. 06/23/19   Merlene Laughter, DO      Allergies  Bee venom    Review of Systems   Review of Systems   See HPI.   Physical Exam Updated Vital Signs BP 129/77   Pulse 98   Temp (!) 97.2 F (36.2 C) (Oral)   Resp 20   SpO2 95%   Physical Exam Vitals and nursing note reviewed.  Constitutional:      General: She is not in acute distress.    Comments: Appears fatigued.  HENT:     Head: Normocephalic and atraumatic.  Eyes:     Extraocular Movements: Extraocular movements intact.  Cardiovascular:     Rate and Rhythm: Normal rate.     Heart sounds: Normal heart  sounds.  Pulmonary:     Effort: Pulmonary effort is normal.     Breath sounds: Normal breath sounds.  Abdominal:     General: Bowel sounds are normal.     Palpations: Abdomen is soft.     Tenderness: There is abdominal tenderness.     Comments: Diffusely tender  Musculoskeletal:     Right lower leg: No edema.     Left lower leg: No edema.  Skin:    General: Skin is warm and dry.  Neurological:     General: No focal deficit present.     Mental Status: She is alert.     Comments: Slow to respond to questions but oriented to person, year, place, and circumstance.     ED Results / Procedures / Treatments   Labs (all labs ordered are listed, but only abnormal results are displayed) Labs Reviewed  CBC WITH DIFFERENTIAL/PLATELET - Abnormal; Notable for the following components:      Result Value   Platelets 443 (*)    All other components within normal limits  I-STAT CHEM 8, ED - Abnormal; Notable for the following components:   Glucose, Bld 117 (*)    Calcium, Ion 1.10 (*)    TCO2 19 (*)    All other components within normal limits  AMMONIA  COMPREHENSIVE METABOLIC PANEL  URINALYSIS, ROUTINE W REFLEX MICROSCOPIC  CBG MONITORING, ED  TROPONIN I (HIGH SENSITIVITY)    EKG None  Radiology No results found.  Procedures Procedures  {Document cardiac monitor, telemetry assessment procedure when appropriate:1}  Medications Ordered in ED Medications  metoCLOPramide (REGLAN) injection 10 mg (10 mg Intramuscular Given 01/21/22 1959)    ED Course/ Medical Decision Making/ A&P Clinical Course as of 01/21/22 2224  Sun Jan 21, 2022  2222 CTAP No acute or active process within the abdomen or pelvis. Colonic diverticulosis.  UA with ? UTI  [DG]    Clinical Course User Index [DG] Stephanie Coup, MD                           Medical Decision Making Amount and/or Complexity of Data Reviewed Radiology: ordered.  Risk Prescription drug management. Decision regarding  hospitalization.   Kristen Ramos is a 61 year old female with history of PRES, seizure, lumbar stenosis, cyclic vomiting syndrome who presents to the ED for abdominal pain, emesis, and AMS.   On chart review, patient was admitted in September of this year with a similar presentation of cyclical vomiting.  She initially had another admission for cyclic vomiting in August.  During last admission, patient developed seizures and was started on Vimpat.  She currently takes 50 mg daily.  On exam she is alert and oriented x4, appears somewhat fatigued.  No focal neurologic deficits noted.  Abdomen is soft with normoactive bowel sounds, but diffusely tender to palpation.  Differential considered: CVA, UTI, pancreatitis, diverticulitis, obstruction, cyclic vomiting syndrome, among others.   Workup: CBC unremarkable, no leukocytosis to suggest new infection, hemoglobin within normal limits at 14.6.  CMP with glucose 115, creatinine within normal limits at 0.97, LFTs within normal limits, no significant electrolyte derangements.  Bicarbonate low at 17, consistent with nausea and vomiting.  Ammonia within normal limits at 16.  Troponin negative at less than 2.  UA with negative nitrites, trace leukocytes, 6-10 WBCs, few bacteria, borderline but possible UTI especially in setting of abdominal pain and vomiting.  ED course: Work-up overall reassuring from laboratory and imaging standpoint.  UA does show signs of possible UTI.  I discussed overall reassuring work-up with patient and her family at bedside. Patient given Reglan, IV fluids, Zofran, IV acetaminophen as family declines morphine, and continued to have recurrent nausea and emesis despite all of these interventions.  Patient additionally given 1.25 mg of droperidol, still continuing to vomit.  Patient unable to tolerate p.o., feel she warrants admission at this time for intractable nausea and vomiting.  Patient given dose of Rocephin IV for UTI in ED prior  to admission. Admitted to ***.   {Document critical care time when appropriate:1} {Document review of labs and clinical decision tools ie heart score, Chads2Vasc2 etc:1}  {Document your independent review of radiology images, and any outside records:1} {Document your discussion with family members, caretakers, and with consultants:1} {Document social determinants of health affecting pt's care:1} {Document your decision making why or why not admission, treatments were needed:1} Final Clinical Impression(s) / ED Diagnoses Final diagnoses:  None    Rx / DC Orders ED Discharge Orders     None

## 2022-01-22 ENCOUNTER — Encounter (HOSPITAL_COMMUNITY): Payer: Self-pay | Admitting: Internal Medicine

## 2022-01-22 DIAGNOSIS — R8290 Unspecified abnormal findings in urine: Secondary | ICD-10-CM | POA: Diagnosis present

## 2022-01-22 DIAGNOSIS — M48061 Spinal stenosis, lumbar region without neurogenic claudication: Secondary | ICD-10-CM | POA: Diagnosis present

## 2022-01-22 DIAGNOSIS — R112 Nausea with vomiting, unspecified: Secondary | ICD-10-CM | POA: Diagnosis present

## 2022-01-22 DIAGNOSIS — E876 Hypokalemia: Secondary | ICD-10-CM | POA: Diagnosis present

## 2022-01-22 DIAGNOSIS — R030 Elevated blood-pressure reading, without diagnosis of hypertension: Secondary | ICD-10-CM | POA: Diagnosis present

## 2022-01-22 DIAGNOSIS — E872 Acidosis, unspecified: Secondary | ICD-10-CM | POA: Insufficient documentation

## 2022-01-22 DIAGNOSIS — M1990 Unspecified osteoarthritis, unspecified site: Secondary | ICD-10-CM | POA: Diagnosis present

## 2022-01-22 DIAGNOSIS — K219 Gastro-esophageal reflux disease without esophagitis: Secondary | ICD-10-CM | POA: Diagnosis present

## 2022-01-22 DIAGNOSIS — Z91030 Bee allergy status: Secondary | ICD-10-CM | POA: Diagnosis not present

## 2022-01-22 DIAGNOSIS — G40909 Epilepsy, unspecified, not intractable, without status epilepticus: Secondary | ICD-10-CM | POA: Diagnosis present

## 2022-01-22 DIAGNOSIS — R1013 Epigastric pain: Secondary | ICD-10-CM | POA: Diagnosis present

## 2022-01-22 DIAGNOSIS — Z1152 Encounter for screening for COVID-19: Secondary | ICD-10-CM | POA: Diagnosis not present

## 2022-01-22 DIAGNOSIS — G8929 Other chronic pain: Secondary | ICD-10-CM | POA: Diagnosis present

## 2022-01-22 DIAGNOSIS — R1115 Cyclical vomiting syndrome unrelated to migraine: Secondary | ICD-10-CM | POA: Diagnosis present

## 2022-01-22 DIAGNOSIS — M069 Rheumatoid arthritis, unspecified: Secondary | ICD-10-CM | POA: Diagnosis present

## 2022-01-22 DIAGNOSIS — Z79899 Other long term (current) drug therapy: Secondary | ICD-10-CM | POA: Diagnosis not present

## 2022-01-22 DIAGNOSIS — I6783 Posterior reversible encephalopathy syndrome: Secondary | ICD-10-CM | POA: Diagnosis present

## 2022-01-22 LAB — BASIC METABOLIC PANEL WITH GFR
Anion gap: 14 (ref 5–15)
BUN: 6 mg/dL (ref 6–20)
CO2: 19 mmol/L — ABNORMAL LOW (ref 22–32)
Calcium: 9.1 mg/dL (ref 8.9–10.3)
Chloride: 107 mmol/L (ref 98–111)
Creatinine, Ser: 0.66 mg/dL (ref 0.44–1.00)
GFR, Estimated: >60 mL/min
Glucose, Bld: 145 mg/dL — ABNORMAL HIGH (ref 70–99)
Potassium: 3.5 mmol/L (ref 3.5–5.1)
Sodium: 140 mmol/L (ref 135–145)

## 2022-01-22 LAB — TROPONIN I (HIGH SENSITIVITY): Troponin I (High Sensitivity): 3 ng/L (ref <18)

## 2022-01-22 MED ORDER — FOLIC ACID 1 MG PO TABS
1.0000 mg | ORAL_TABLET | Freq: Every day | ORAL | Status: DC
  Administered 2022-01-22 – 2022-01-25 (×4): 1 mg via ORAL
  Filled 2022-01-22 (×4): qty 1

## 2022-01-22 MED ORDER — ENOXAPARIN SODIUM 40 MG/0.4ML IJ SOSY
40.0000 mg | PREFILLED_SYRINGE | INTRAMUSCULAR | Status: DC
  Administered 2022-01-22 – 2022-01-25 (×4): 40 mg via SUBCUTANEOUS
  Filled 2022-01-22 (×5): qty 0.4

## 2022-01-22 MED ORDER — MECLIZINE HCL 25 MG PO TABS
25.0000 mg | ORAL_TABLET | Freq: Three times a day (TID) | ORAL | Status: DC | PRN
  Administered 2022-01-24: 25 mg via ORAL
  Filled 2022-01-22 (×2): qty 1

## 2022-01-22 MED ORDER — ONDANSETRON HCL 4 MG/2ML IJ SOLN
4.0000 mg | Freq: Four times a day (QID) | INTRAMUSCULAR | Status: DC | PRN
  Administered 2022-01-22: 4 mg via INTRAVENOUS
  Filled 2022-01-22: qty 2

## 2022-01-22 MED ORDER — PANTOPRAZOLE SODIUM 40 MG IV SOLR: 40.0000 mg | INTRAVENOUS | Status: DC

## 2022-01-22 MED ORDER — SODIUM CHLORIDE 0.9 % IV SOLN: INTRAVENOUS | Status: AC

## 2022-01-22 MED ORDER — BENZONATATE 100 MG PO CAPS: 100.0000 mg | ORAL_CAPSULE | Freq: Three times a day (TID) | ORAL | Status: DC | PRN

## 2022-01-22 MED ORDER — PANTOPRAZOLE SODIUM 40 MG IV SOLR
40.0000 mg | Freq: Two times a day (BID) | INTRAVENOUS | Status: DC
  Administered 2022-01-22 – 2022-01-25 (×7): 40 mg via INTRAVENOUS
  Filled 2022-01-22 (×7): qty 10

## 2022-01-22 MED ORDER — ONDANSETRON HCL 4 MG/2ML IJ SOLN: 4.0000 mg | Freq: Four times a day (QID) | INTRAMUSCULAR | Status: DC | PRN

## 2022-01-22 MED ORDER — ACETAMINOPHEN 325 MG PO TABS: 650.0000 mg | ORAL_TABLET | Freq: Four times a day (QID) | ORAL | Status: DC | PRN

## 2022-01-22 MED ORDER — ACETAMINOPHEN 650 MG RE SUPP: 650.0000 mg | Freq: Four times a day (QID) | RECTAL | Status: DC | PRN

## 2022-01-22 MED ORDER — OXYCODONE HCL 5 MG PO TABS
15.0000 mg | ORAL_TABLET | Freq: Four times a day (QID) | ORAL | Status: DC | PRN
  Administered 2022-01-23 – 2022-01-25 (×6): 15 mg via ORAL
  Filled 2022-01-22 (×7): qty 3

## 2022-01-22 MED ORDER — POTASSIUM CHLORIDE CRYS ER 20 MEQ PO TBCR
40.0000 meq | EXTENDED_RELEASE_TABLET | Freq: Once | ORAL | Status: AC
  Administered 2022-01-22: 40 meq via ORAL
  Filled 2022-01-22: qty 2

## 2022-01-22 MED ORDER — GABAPENTIN 600 MG PO TABS
600.0000 mg | ORAL_TABLET | Freq: Two times a day (BID) | ORAL | Status: DC
  Administered 2022-01-22 – 2022-01-25 (×7): 600 mg via ORAL
  Filled 2022-01-22 (×7): qty 1

## 2022-01-22 MED ORDER — HYDRALAZINE HCL 20 MG/ML IJ SOLN
5.0000 mg | Freq: Four times a day (QID) | INTRAMUSCULAR | Status: DC | PRN
  Administered 2022-01-22: 5 mg via INTRAVENOUS
  Filled 2022-01-22: qty 1

## 2022-01-22 MED ORDER — SODIUM CHLORIDE 0.9 % IV SOLN: INTRAVENOUS | Status: DC

## 2022-01-22 MED ORDER — METOCLOPRAMIDE HCL 5 MG/ML IJ SOLN
5.0000 mg | Freq: Four times a day (QID) | INTRAMUSCULAR | Status: DC
  Administered 2022-01-22 – 2022-01-25 (×13): 5 mg via INTRAVENOUS
  Filled 2022-01-22 (×13): qty 2

## 2022-01-22 MED ORDER — GABAPENTIN ENACARBIL ER 600 MG PO TBCR: 600.0000 mg | EXTENDED_RELEASE_TABLET | Freq: Two times a day (BID) | ORAL | Status: DC

## 2022-01-22 MED ORDER — SODIUM CHLORIDE 0.9 % IV SOLN
50.0000 mg | Freq: Two times a day (BID) | INTRAVENOUS | Status: DC
  Administered 2022-01-22 – 2022-01-25 (×7): 50 mg via INTRAVENOUS
  Filled 2022-01-22 (×10): qty 5

## 2022-01-22 NOTE — Progress Notes (Signed)
  Transition of Care [facility]) Screening Note   Patient Details  Name: Kristen Ramos Date of Birth: [birth year]   Transition of Care [facility]) CM/SW Contact:    Harriet Masson, RN Phone Number: 01/22/2022, 1:43 PM    Transition of Care Department [facility]) has reviewed patient and no TOC needs have been identified at this time. We will continue to monitor patient advancement through interdisciplinary progression rounds. If new patient transition needs arise, please place a TOC consult.

## 2022-01-22 NOTE — Progress Notes (Signed)
[facility] PROGRESS NOTE   Kristen Ramos HYI:[id number] DOB: March 30, 1960 DOA: 01/21/2022  PCP: Jordan Hawks, PA-C  Brief History/Interval Summary: 61 y.o. 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  CT of the abdomen pelvis was unremarkable.  Patient was given symptomatic treatment with no improvement.  Subsequently she was hospitalized for further management.  Patient stated that she has previously been seen by gastroenterology with [facility] for this problem and they have not been able to find the etiology of her symptoms.    Consultants: None  Procedures: None    Subjective/Interval History: Patient continues to have nausea and vomiting.  Denies any blood in the emesis.  No abdominal pain per se.  Complains of back pain which is chronic for her.    Assessment/Plan:  Intractable nausea and vomiting/history of cyclical vomiting syndrome Abdomen is benign on examination.  CT scan was negative for acute findings. Patient has been started on Reglan on a scheduled basis.  Continue PPI twice a day.  Symptomatic treatment.  IV fluids. Gastric emptying study from 2020 did not suggest gastroparesis.  Upper endoscopy in 2020 suggested gastritis.  She is followed by gastroenterology in [facility].  Based on care everywhere it looks like her symptoms were controlled with the meclizine.  Will initiate meclizine here as well. Continue management as outlined above.  Hopefully she will improve in the next 24 to 48 hours.  Transient altered mentation Seems to be back to baseline.  No evidence for neurological deficits.  Abnormal UA Denies any dysuria.  Several squamous epithelial cells noted.  Likely a contaminated sample.   Will not continue antibiotics.  Rheumatoid arthritis Methotrexate has been stopped previously as it was thought to be causing PRES. Followed by rheumatology in the outpatient setting.  Seizure disorder Continue Vimpat  Elevated blood pressure readings No previous history of hypertension.  Elevated blood pressure could be situational.  Monitor for now.  Normal anion gap metabolic acidosis Possibly due to hypovolemia.  Continue IV fluids.  Chronic low back pain Resume her oxycodone.  Prescriber database reviewed.  Oxycodone was last prescribed on 10/18, 120 tablets.  GERD PPI  DVT Prophylaxis: Lovenox Code Status: Full code Family Communication: Discussed with patient Disposition Plan: Mobilize.  Hopefully return home when improved  Status is: Observation The patient will require care spanning > 2 midnights and should be moved to inpatient because: Intractable nausea and vomiting      Medications: Scheduled:  enoxaparin (LOVENOX) injection  40 mg Subcutaneous Q24H   folic acid  1 mg Oral Daily   Gabapentin Enacarbil  600 mg Oral BID   metoCLOPramide (REGLAN) injection  5 mg Intravenous Q6H   pantoprazole (PROTONIX) IV  40 mg Intravenous Q12H   Continuous:  sodium chloride 100 mL/hr at 01/22/22 0855   cefTRIAXone (ROCEPHIN)  IV     lacosamide (VIMPAT) IV     NOM:[id number] **OR** acetaminophen, benzonatate, hydrALAZINE, ondansetron (ZOFRAN) IV, oxyCODONE  Antibiotics: Anti-infectives (From admission, onward)    Start     Dose/Rate Route Frequency Ordered Stop   01/21/22 2345  cefTRIAXone (ROCEPHIN) 1 g in sodium chloride 0.9 % 100 mL IVPB        1 g 200 mL/hr over 30 Minutes Intravenous  Once 01/21/22 2330  Objective:  Vital Signs  Vitals:   01/22/22 0425 01/22/22 0430 01/22/22 0604 01/22/22 0604  BP: (!) 154/72  (!) 137/53   Pulse: 89 94 94   Resp: (!) 30 (!) 28 (!) 21   Temp:   (!) 97.5 F (36.4 C)   TempSrc:   Oral   SpO2: 100% 100% 100%    Weight:    72.6 kg  Height:    5\' 6"  (1.676 m)    Intake/Output Summary (Last 24 hours) at 01/22/2022 0906 Last data filed at 01/22/2022 0219 Gross per 24 hour  Intake 2000 ml  Output --  Net 2000 ml   Filed Weights   01/22/22 0604  Weight: 72.6 kg    General appearance: Awake alert.  In no distress Resp: Clear to auscultation bilaterally.  Normal effort Cardio: S1-S2 is normal regular.  No S3-S4.  No rubs murmurs or bruit GI: Abdomen is soft.  Nontender nondistended.  Bowel sounds are present normal.  No masses organomegaly Extremities: No edema.  Full range of motion of lower extremities. Neurologic: Alert and oriented x3.  No focal neurological deficits.    Lab Results:  Data Reviewed: I have personally reviewed following labs and reports of the imaging studies  CBC: Recent Labs  Lab 01/21/22 1955 01/21/22 2019  WBC 7.6  --   NEUTROABS 5.1  --   HGB 14.6 15.0  HCT 43.6 44.0  MCV 88.6  --   PLT 443*  --     Basic Metabolic Panel: Recent Labs  Lab 01/21/22 1955 01/21/22 2019 01/22/22 0415  NA 140 142 140  K 3.6 3.8 3.5  CL 108 111 107  CO2 17*  --  19*  GLUCOSE 115* 117* 145*  BUN 9 10 6   CREATININE 0.97 0.70 0.66  CALCIUM 9.7  --  9.1    GFR: Estimated Creatinine Clearance: 77.2 mL/min (by C-G formula based on SCr of 0.66 mg/dL).  Liver Function Tests: Recent Labs  Lab 01/21/22 1955  AST 27  ALT 20  ALKPHOS 88  BILITOT 0.6  PROT 9.2*  ALBUMIN 3.4*    Recent Labs  Lab 01/21/22 1955  AMMONIA 16     Radiology Studies: CT ABDOMEN PELVIS W CONTRAST  Result Date: 01/21/2022 CLINICAL DATA:  Abdominal pain. EXAM: CT ABDOMEN AND PELVIS WITH CONTRAST TECHNIQUE: Multidetector CT imaging of the abdomen and pelvis was performed using the standard protocol following bolus administration of intravenous contrast. RADIATION DOSE REDUCTION: This exam was performed according to the departmental dose-optimization program which includes automated  exposure control, adjustment of the mA and/or kV according to patient size and/or use of iterative reconstruction technique. CONTRAST:  62mL OMNIPAQUE IOHEXOL 350 MG/ML SOLN COMPARISON:  October 23, 2021 and February 12, 2018 FINDINGS: Lower chest: No acute abnormality. Hepatobiliary: No focal liver abnormality is seen. No gallstones, gallbladder wall thickening, or biliary dilatation. Pancreas: Unremarkable. No pancreatic ductal dilatation or surrounding inflammatory changes. Spleen: Normal in size without focal abnormality. Adrenals/Urinary Tract: A stable 16 mm x 10 mm isodense (approximately 94.31 Hounsfield units) left adrenal mass is seen. The right adrenal gland is unremarkable. Kidneys are normal, without renal calculi, focal lesion, or hydronephrosis. The urinary bladder is poorly distended and subsequently limited in evaluation. Stomach/Bowel: Stomach is within normal limits. Appendix appears normal. No evidence of bowel wall thickening, distention, or inflammatory changes. Noninflamed diverticula are seen within the ascending colon. Vascular/Lymphatic: Aortic atherosclerosis. No enlarged abdominal or pelvic lymph nodes. Reproductive: Status post hysterectomy. No adnexal  masses. Other: No abdominal wall hernia or abnormality. No abdominopelvic ascites. Musculoskeletal: Postoperative changes are seen within the lower lumbar spine. IMPRESSION: 1. No acute or active process within the abdomen or pelvis. 2. Stable left adrenal adenoma. No follow-up imaging is recommended. This recommendation follows ACR consensus guidelines: Management of Incidental Adrenal Masses: A White Paper of the ACR Incidental Findings Committee. J Am Coll Radiol 2017;14:1038-1044. 3. Colonic diverticulosis. 4. Postoperative changes within the lower lumbar spine. 5. Aortic atherosclerosis. Aortic Atherosclerosis (ICD10-I70.0). Electronically Signed   By: Aram Candela M.D.   On: 01/21/2022 22:04   CT Head Wo Contrast  Result Date:  01/21/2022 CLINICAL DATA:  Altered mental status. EXAM: CT HEAD WITHOUT CONTRAST TECHNIQUE: Contiguous axial images were obtained from the base of the skull through the vertex without intravenous contrast. RADIATION DOSE REDUCTION: This exam was performed according to the departmental dose-optimization program which includes automated exposure control, adjustment of the mA and/or kV according to patient size and/or use of iterative reconstruction technique. COMPARISON:  Head CT dated 11/20/2021. FINDINGS: Brain: The ventricles and sulci are appropriate size for the patient's age. The gray-white matter discrimination is preserved. There is no acute intracranial hemorrhage. No mass effect or midline shift. No extra-axial fluid collection. Vascular: No hyperdense vessel or unexpected calcification. Skull: Normal. Negative for fracture or focal lesion. Sinuses/Orbits: No acute finding. There is dysconjugate gaze which may represent strabismus. Other: None IMPRESSION: No acute intracranial pathology. Electronically Signed   By: Elgie Collard M.D.   On: 01/21/2022 21:10       LOS: 0 days   Kristen Ramos  [facility] Pager on [email or web address]  01/22/2022, 9:06 AM

## 2022-01-22 NOTE — H&P (Signed)
History and Physical    Kristen Ramos WNI:[id number] DOB: [birth year] DOA: 01/21/2022  PCP: Jordan Hawks, PA-C  Patient coming from: Home  Chief Complaint: Nausea and vomiting  HPI: Kristen Ramos is a 61 y.o. 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  In the ED, blood pressure elevated with systolic up to 160s.  Afebrile.  Labs showing no leukocytosis, bicarb 17, anion gap 15, glucose 115, normal LFTs, ammonia normal, high-sensitivity troponin negative.  UA showing negative nitrite, trace leukocytes, and microscopy showing 6-10 WBCs and few bacteria.  CT head negative for acute finding.  CT abdomen pelvis negative for acute finding and showing stable left adrenal adenoma. Patient was given Benadryl, droperidol, Reglan, Zofran, Tylenol, ceftriaxone, Vimpat, and 2 L fluid boluses.  [facility] called to admit for intractable nausea and vomiting.  Patient reports 3-day history of intractable nausea and vomiting.  She has not been able to tolerate any p.o. intake.  Reporting periumbilical/epigastric abdominal pain. States she has previously been seen by gastroenterology with [facility] for this problem and they have not been able to find the etiology of her symptoms.  She denies marijuana or any other drug use.  Last bowel movement was today.  She has not been able to drink fluids and endorsing reduced urine output with mild burning.  Denies fevers, shortness breath, or chest pain.  Review of Systems:  Review of Systems  All other systems reviewed and are negative.   Past Medical History:  Diagnosis Date   Allergic rhinitis    Arthritis    Chronic back pain    GERD (gastroesophageal reflux disease)    RA (rheumatoid arthritis) (HCC)      Past Surgical History:  Procedure Laterality Date   ABDOMINAL HYSTERECTOMY     BACK SURGERY     BIOPSY  04/19/2018   Procedure: BIOPSY;  Surgeon: Kerin Salen, MD;  Location: [facility];  Service: Gastroenterology;;   CESAREAN SECTION     ESOPHAGOGASTRODUODENOSCOPY (EGD) WITH PROPOFOL N/A 04/19/2018   Procedure: ESOPHAGOGASTRODUODENOSCOPY (EGD) WITH PROPOFOL;  Surgeon: Kerin Salen, MD;  Location: [facility];  Service: Gastroenterology;  Laterality: N/A;     reports that she has been smoking cigarettes. She has never used smokeless tobacco. She reports that she does not drink alcohol and does not use drugs.  Allergies  Allergen Reactions   Bee Venom Hives and Swelling    History reviewed. No pertinent family history.  Prior to Admission medications   Medication Sig Start Date End Date Taking? Authorizing Provider  benzonatate (TESSALON) 100 MG capsule Take 1-2 capsules (100-200 mg total) by mouth 3 (three) times daily as needed for cough. Patient taking differently: Take 100 mg by mouth 3 (three) times daily as needed for cough. 11/18/21  Yes Petrucelli, Samantha R, PA-C  folic acid (FOLVITE) 1 MG tablet Take 1 mg by mouth daily. 04/01/19  Yes [provider]  HORIZANT 600 MG TBCR Take 600 mg by mouth 2 (two) times daily. 10/30/21  Yes [provider]  lacosamide (VIMPAT) 50 MG TABS tablet Take 1 tablet (50 mg total) by mouth 2 (two) times daily. 11/27/21  Yes Standley Brooking, MD  ondansetron (ZOFRAN-ODT) 4 MG disintegrating tablet Take 4-8 mg by mouth in the morning, at noon, and at bedtime. 10/18/21  Yes [provider]  oxyCODONE (ROXICODONE) 15 MG immediate release tablet Take 1 tablet (15 mg total) by mouth 4 (four) times daily as needed for pain. Patient taking differently: Take 15 mg by mouth in the morning, at noon, in the evening, and at bedtime. 10/27/21  Yes Rhetta Mura, MD  pantoprazole (PROTONIX) 40 MG tablet Take 1 tablet (40 mg total) by  mouth daily. 06/22/19  Yes Sheikh, Omair Latif, DO  polyethylene glycol (MIRALAX / GLYCOLAX) 17 g packet Take 17 g by mouth daily. Patient taking differently: Take 17 g by mouth daily as needed for mild constipation. 06/23/19  Yes Sheikh, Omair Latif, DO  Cholecalciferol (VITAMIN D-3 PO) Take 1 capsule by mouth daily. Patient not taking: Reported on 01/21/2022    [provider]  meclizine (ANTIVERT) 25 MG tablet Take 1 tablet (25 mg total) by mouth 3 (three) times daily as needed for dizziness. 10/20/21   Derwood Kaplan, MD    Physical Exam: Vitals:   01/21/22 1944 01/21/22 2045 01/21/22 2100  BP: 129/77 (!) 161/98 (!) 155/97  Pulse: 98 86 78  Resp: 20 20 20   Temp: (!) 97.2 F (36.2 C)    TempSrc: Oral    SpO2: 95% 100% 100%    Physical Exam Vitals reviewed.  Constitutional:      General: She is not in acute distress. HENT:     Head: Normocephalic and atraumatic.  Eyes:     Extraocular Movements: Extraocular movements intact.  Cardiovascular:     Rate and Rhythm: Normal rate and regular rhythm.     Pulses: Normal pulses.  Pulmonary:     Effort: Pulmonary effort is normal. No respiratory distress.     Breath sounds: Normal breath sounds. No wheezing or rales.  Abdominal:     General: Bowel sounds are normal. There is no distension.     Palpations: Abdomen is soft.     Tenderness: There is no abdominal tenderness. There is no guarding or rebound.  Musculoskeletal:     Cervical back: Normal range of motion.     Right lower leg: No edema.     Left lower leg: No edema.  Skin:    General: Skin is warm and dry.  Neurological:     General: No focal deficit present.     Mental Status: She is alert and oriented to person, place, and time.     Labs on Admission: I have personally reviewed following labs and imaging studies  CBC: Recent Labs  Lab 01/21/22 1955 01/21/22 2019  WBC 7.6  --   NEUTROABS 5.1  --   HGB 14.6 15.0  HCT 43.6 44.0  MCV 88.6  --   PLT  443*  --    Basic Metabolic Panel: Recent Labs  Lab 01/21/22 1955 01/21/22 2019  NA 140 142  K 3.6 3.8  CL 108 111  CO2 17*  --   GLUCOSE 115* 117*  BUN 9 10  CREATININE 0.97 0.70  CALCIUM 9.7  --    GFR: CrCl cannot be calculated (Unknown ideal weight.). Liver Function Tests: Recent Labs  Lab 01/21/22 1955  AST 27  ALT 20  ALKPHOS 88  BILITOT 0.6  PROT 9.2*  ALBUMIN 3.4*   No results for input(s): "LIPASE", "AMYLASE" in the last 168 hours. Recent Labs  Lab 01/21/22 1955  AMMONIA 16   Coagulation Profile: No results for input(s): "INR", "PROTIME" in the last 168 hours. Cardiac Enzymes: No results for input(s): "CKTOTAL", "CKMB", "CKMBINDEX", "TROPONINI" in the  last 168 hours. BNP (last 3 results) No results for input(s): "PROBNP" in the last 8760 hours. HbA1C: No results for input(s): "HGBA1C" in the last 72 hours. CBG: No results for input(s): "GLUCAP" in the last 168 hours. Lipid Profile: No results for input(s): "CHOL", "HDL", "LDLCALC", "TRIG", "CHOLHDL", "LDLDIRECT" in the last 72 hours. Thyroid Function Tests: No results for input(s): "TSH", "T4TOTAL", "FREET4", "T3FREE", "THYROIDAB" in the last 72 hours. Anemia Panel: No results for input(s): "VITAMINB12", "FOLATE", "FERRITIN", "TIBC", "IRON", "RETICCTPCT" in the last 72 hours. Urine analysis:    Component Value Date/Time   COLORURINE YELLOW 01/21/2022 2115   APPEARANCEUR HAZY (A) 01/21/2022 2115   LABSPEC 1.033 (H) 01/21/2022 2115   PHURINE 5.0 01/21/2022 2115   GLUCOSEU NEGATIVE 01/21/2022 2115   HGBUR NEGATIVE 01/21/2022 2115   BILIRUBINUR NEGATIVE 01/21/2022 2115   KETONESUR 5 (A) 01/21/2022 2115   PROTEINUR 30 (A) 01/21/2022 2115   NITRITE NEGATIVE 01/21/2022 2115   LEUKOCYTESUR TRACE (A) 01/21/2022 2115    Radiological Exams on Admission: CT ABDOMEN PELVIS W CONTRAST  Result Date: 01/21/2022 CLINICAL DATA:  Abdominal pain. EXAM: CT ABDOMEN AND PELVIS WITH CONTRAST TECHNIQUE:  Multidetector CT imaging of the abdomen and pelvis was performed using the standard protocol following bolus administration of intravenous contrast. RADIATION DOSE REDUCTION: This exam was performed according to the departmental dose-optimization program which includes automated exposure control, adjustment of the mA and/or kV according to patient size and/or use of iterative reconstruction technique. CONTRAST:  66mL OMNIPAQUE IOHEXOL 350 MG/ML SOLN COMPARISON:  October 23, 2021 and February 12, 2018 FINDINGS: Lower chest: No acute abnormality. Hepatobiliary: No focal liver abnormality is seen. No gallstones, gallbladder wall thickening, or biliary dilatation. Pancreas: Unremarkable. No pancreatic ductal dilatation or surrounding inflammatory changes. Spleen: Normal in size without focal abnormality. Adrenals/Urinary Tract: A stable 16 mm x 10 mm isodense (approximately 94.31 Hounsfield units) left adrenal mass is seen. The right adrenal gland is unremarkable. Kidneys are normal, without renal calculi, focal lesion, or hydronephrosis. The urinary bladder is poorly distended and subsequently limited in evaluation. Stomach/Bowel: Stomach is within normal limits. Appendix appears normal. No evidence of bowel wall thickening, distention, or inflammatory changes. Noninflamed diverticula are seen within the ascending colon. Vascular/Lymphatic: Aortic atherosclerosis. No enlarged abdominal or pelvic lymph nodes. Reproductive: Status post hysterectomy. No adnexal masses. Other: No abdominal wall hernia or abnormality. No abdominopelvic ascites. Musculoskeletal: Postoperative changes are seen within the lower lumbar spine. IMPRESSION: 1. No acute or active process within the abdomen or pelvis. 2. Stable left adrenal adenoma. No follow-up imaging is recommended. This recommendation follows ACR consensus guidelines: Management of Incidental Adrenal Masses: A White Paper of the ACR Incidental Findings Committee. J Am Coll Radiol  2017;14:1038-1044. 3. Colonic diverticulosis. 4. Postoperative changes within the lower lumbar spine. 5. Aortic atherosclerosis. Aortic Atherosclerosis (ICD10-I70.0). Electronically Signed   By: Aram Candela M.D.   On: 01/21/2022 22:04   CT Head Wo Contrast  Result Date: 01/21/2022 CLINICAL DATA:  Altered mental status. EXAM: CT HEAD WITHOUT CONTRAST TECHNIQUE: Contiguous axial images were obtained from the base of the skull through the vertex without intravenous contrast. RADIATION DOSE REDUCTION: This exam was performed according to the departmental dose-optimization program which includes automated exposure control, adjustment of the mA and/or kV according to patient size and/or use of iterative reconstruction technique. COMPARISON:  Head CT dated 11/20/2021. FINDINGS: Brain: The ventricles and sulci are appropriate size for the patient's age. The gray-white matter discrimination is preserved. There is no acute intracranial  hemorrhage. No mass effect or midline shift. No extra-axial fluid collection. Vascular: No hyperdense vessel or unexpected calcification. Skull: Normal. Negative for fracture or focal lesion. Sinuses/Orbits: No acute finding. There is dysconjugate gaze which may represent strabismus. Other: None IMPRESSION: No acute intracranial pathology. Electronically Signed   By: Elgie Collard M.D.   On: 01/21/2022 21:10    EKG: Independently reviewed.  Sinus rhythm.  Assessment and Plan  Intractable nausea and vomiting History of cyclic vomiting syndrome LFTs normal.  Abdominal exam benign.  CT abdomen pelvis negative for acute finding. -Continue IV fluid hydration -Scheduled Reglan 5 mg every 6 hours -IV Protonix 40 mg daily -Antiemetic as needed  ?AMS Patient is currently AAO x4 and answering questions appropriately.  No focal neurodeficit on exam.  CT head negative for acute finding.  Ammonia level normal. -Continue to monitor  ?UTI Patient is endorsing decreased urine  output/mild dysuria in the setting of not being able to drink fluids due to intractable nausea and vomiting for the past 3 days.  UA showing negative nitrite, trace leukocytes, and microscopy showing 6-10 WBCs and few bacteria.  No fever, leukocytosis, or signs of sepsis. -Patient received a dose of ceftriaxone in the ED. -Add on urine culture  Rheumatoid arthritis Methotrexate previously stopped as it was thought to be causing PRES. -Outpatient rheumatology follow-up  GERD -Continue PPI  Seizure disorder -Continue Vimpat  Elevated blood pressure No documented history of hypertension.  Blood pressure continues to be elevated with systolic currently in the 170s. -IV hydralazine as needed  Metabolic acidosis Bicarb 17, anion gap 15.  No hyperglycemia/diabetes and renal function stable. -Continue IV fluid hydration -Monitor BMP, replace bicarb if not improving  DVT prophylaxis: Lovenox Code Status: Full Code (discussed with the patient) Family Communication: No family at bedside. Level of care: Med-Surg Admission status: It is my clinical opinion that referral for OBSERVATION is reasonable and necessary in this patient based on the above information provided. The aforementioned taken together are felt to place the patient at high risk for further clinical deterioration. However, it is anticipated that the patient may be medically stable for discharge from the hospital within 24 to 48 hours.   John Giovanni MD [facility]  If 7PM-7AM, please contact night-coverage [email or web address]  01/22/2022, 12:11 AM

## 2022-01-23 ENCOUNTER — Ambulatory Visit: Payer: Medicare (Managed Care) | Admitting: Occupational Therapy

## 2022-01-23 DIAGNOSIS — R112 Nausea with vomiting, unspecified: Secondary | ICD-10-CM | POA: Diagnosis not present

## 2022-01-23 LAB — CBC
HCT: 41.8 % (ref 36.0–46.0)
Hemoglobin: 14.1 g/dL (ref 12.0–15.0)
MCH: 29.8 pg (ref 26.0–34.0)
MCHC: 33.7 g/dL (ref 30.0–36.0)
MCV: 88.4 fL (ref 80.0–100.0)
Platelets: 379 10*3/uL (ref 150–400)
RBC: 4.73 MIL/uL (ref 3.87–5.11)
RDW: 13.6 % (ref 11.5–15.5)
WBC: 8.9 10*3/uL (ref 4.0–10.5)
nRBC: 0 % (ref 0.0–0.2)

## 2022-01-23 LAB — BASIC METABOLIC PANEL WITH GFR
Anion gap: 8 (ref 5–15)
BUN: 6 mg/dL (ref 6–20)
CO2: 21 mmol/L — ABNORMAL LOW (ref 22–32)
Calcium: 9.1 mg/dL (ref 8.9–10.3)
Chloride: 109 mmol/L (ref 98–111)
Creatinine, Ser: 0.79 mg/dL (ref 0.44–1.00)
GFR, Estimated: >60 mL/min (ref >60)
Glucose, Bld: 105 mg/dL — ABNORMAL HIGH (ref 70–99)
Potassium: 3.6 mmol/L (ref 3.5–5.1)
Sodium: 138 mmol/L (ref 135–145)

## 2022-01-23 LAB — MAGNESIUM: Magnesium: 1.9 mg/dL (ref 1.7–2.4)

## 2022-01-23 MED ORDER — POTASSIUM CHLORIDE 10 MEQ/100ML IV SOLN
10.0000 meq | INTRAVENOUS | Status: AC
  Administered 2022-01-23 (×2): 10 meq via INTRAVENOUS
  Filled 2022-01-23 (×2): qty 100

## 2022-01-23 MED ORDER — SODIUM CHLORIDE 0.9 % IV SOLN: INTRAVENOUS | Status: DC

## 2022-01-23 MED ORDER — SODIUM CHLORIDE 0.9 % IV SOLN
12.5000 mg | Freq: Once | INTRAVENOUS | Status: AC
  Administered 2022-01-23: 12.5 mg via INTRAVENOUS
  Filled 2022-01-23: qty 0.5

## 2022-01-23 MED ORDER — SODIUM CHLORIDE 0.9 % IV SOLN
12.5000 mg | Freq: Four times a day (QID) | INTRAVENOUS | Status: DC | PRN
  Administered 2022-01-24 – 2022-01-25 (×2): 12.5 mg via INTRAVENOUS
  Filled 2022-01-23 (×2): qty 0.5

## 2022-01-23 NOTE — Progress Notes (Signed)
[facility] PROGRESS NOTE   Kristen Ramos OVF:[id number] DOB: Sep 30, 1960 DOA: 01/21/2022  PCP: Jordan Hawks, PA-C  Brief History/Interval Summary: 61 y.o. 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  CT of the abdomen pelvis was unremarkable.  Patient was given symptomatic treatment with no improvement.  Subsequently she was hospitalized for further management.  Patient stated that she has previously been seen by gastroenterology with [facility] for this problem and they have not been able to find the etiology of her symptoms.    Consultants: None  Procedures: None    Subjective/Interval History: Patient continues to have nausea.  Has vomited about 5 times in the last 24 hours.  Also has mid abdominal pain.      Assessment/Plan:  Intractable nausea and vomiting/history of cyclical vomiting syndrome Abdomen is benign on examination.  CT scan was negative for acute findings. Patient has been started on Reglan on a scheduled basis.  Continue PPI twice a day.  Symptomatic treatment.  IV fluids. Gastric emptying study from 2020 did not suggest gastroparesis.  Upper endoscopy in 2020 suggested gastritis.  She is followed by gastroenterology in [facility].  Based on care everywhere it looks like her symptoms were controlled with the meclizine.  Meclizine was initiated yesterday. Patient has not improved much.  Continue symptomatic treatment for now.   We will switch the ondansetron to Phenergan.  Transient altered mentation Seems to be back to baseline.  No evidence for neurological deficits.  Abnormal UA Denies any dysuria.  Several squamous epithelial cells noted.  Likely a contaminated sample.  Will not continue  antibiotics.  Rheumatoid arthritis Methotrexate has been stopped previously as it was thought to be causing PRES. Followed by rheumatology in the outpatient setting.  Seizure disorder Continue Vimpat.  Currently being dosed intravenously.  Elevated blood pressure readings No previous history of hypertension.  Elevated blood pressure could be situational.  Use hydralazine as needed.  Continue to monitor.  Normal anion gap metabolic acidosis Possibly due to hypovolemia.  Continue IV fluids.  Improved.  Chronic low back pain Prescriber database reviewed.  Oxycodone was last prescribed on 10/18, 120 tablets.  GERD PPI  DVT Prophylaxis: Lovenox Code Status: Full code Family Communication: Discussed with patient Disposition Plan: Mobilize.  Hopefully return home when improved  Status is: Observation The patient will require care spanning > 2 midnights and should be moved to inpatient because: Intractable nausea and vomiting      Medications: Scheduled:  enoxaparin (LOVENOX) injection  40 mg Subcutaneous Q24H   folic acid  1 mg Oral Daily   gabapentin  600 mg Oral BID   metoCLOPramide (REGLAN) injection  5 mg Intravenous Q6H   pantoprazole (PROTONIX) IV  40 mg Intravenous Q12H   Continuous:  sodium chloride     lacosamide (VIMPAT) IV 50 mg (01/22/22 2137)   ACZ:[id number] **OR** acetaminophen, benzonatate, hydrALAZINE, meclizine, ondansetron (ZOFRAN) IV, oxyCODONE  Antibiotics: Anti-infectives (From admission, onward)    Start     Dose/Rate Route Frequency Ordered Stop   01/21/22 2345  cefTRIAXone (ROCEPHIN) 1 g in sodium chloride 0.9 % 100 mL IVPB  Status:  Discontinued        1 g 200 mL/hr over 30 Minutes Intravenous  Once 01/21/22 2330 01/23/22 0935  Objective:  Vital Signs  Vitals:   01/22/22 1900 01/23/22 0037 01/23/22 0517 01/23/22 0830  BP: (!) 141/75 111/83 125/70 (!) 161/71  Pulse: (!) 110 (!) 102 84 73  Resp: 16  16 18   Temp: 99.1 F (37.3  C) 98.1 F (36.7 C) 98.6 F (37 C) 98.5 F (36.9 C)  TempSrc: Oral Oral Oral Oral  SpO2: 98% 98% 100%   Weight:      Height:        Intake/Output Summary (Last 24 hours) at 01/23/2022 0935 Last data filed at 01/22/2022 2142 Gross per 24 hour  Intake 844.98 ml  Output --  Net 844.98 ml    Filed Weights   01/22/22 0604  Weight: 72.6 kg    General appearance: Awake alert.  In no distress Resp: Clear to auscultation bilaterally.  Normal effort Cardio: S1-S2 is normal regular.  No S3-S4.  No rubs murmurs or bruit GI: Abdomen is soft.  Mildly tender in the mid abdomen without any rebound rigidity or guarding.  No masses organomegaly.  Bowel sounds present. Extremities: No edema.  Full range of motion of lower extremities. Neurologic: Alert and oriented x3.  No focal neurological deficits.      Lab Results:  Data Reviewed: I have personally reviewed following labs and reports of the imaging studies  CBC: Recent Labs  Lab 01/21/22 1955 01/21/22 2019 01/23/22 0259  WBC 7.6  --  8.9  NEUTROABS 5.1  --   --   HGB 14.6 15.0 14.1  HCT 43.6 44.0 41.8  MCV 88.6  --  88.4  PLT 443*  --  379     Basic Metabolic Panel: Recent Labs  Lab 01/21/22 1955 01/21/22 2019 01/22/22 0415 01/23/22 0259  NA 140 142 140 138  K 3.6 3.8 3.5 3.6  CL 108 111 107 109  CO2 17*  --  19* 21*  GLUCOSE 115* 117* 145* 105*  BUN 9 10 6 6   CREATININE 0.97 0.70 0.66 0.79  CALCIUM 9.7  --  9.1 9.1  MG  --   --   --  1.9     GFR: Estimated Creatinine Clearance: 77.2 mL/min (by C-G formula based on SCr of 0.79 mg/dL).  Liver Function Tests: Recent Labs  Lab 01/21/22 1955  AST 27  ALT 20  ALKPHOS 88  BILITOT 0.6  PROT 9.2*  ALBUMIN 3.4*     Recent Labs  Lab 01/21/22 1955  AMMONIA 16      Radiology Studies: CT ABDOMEN PELVIS W CONTRAST  Result Date: 01/21/2022 CLINICAL DATA:  Abdominal pain. EXAM: CT ABDOMEN AND PELVIS WITH CONTRAST TECHNIQUE: Multidetector CT imaging  of the abdomen and pelvis was performed using the standard protocol following bolus administration of intravenous contrast. RADIATION DOSE REDUCTION: This exam was performed according to the departmental dose-optimization program which includes automated exposure control, adjustment of the mA and/or kV according to patient size and/or use of iterative reconstruction technique. CONTRAST:  26mL OMNIPAQUE IOHEXOL 350 MG/ML SOLN COMPARISON:  October 23, 2021 and February 12, 2018 FINDINGS: Lower chest: No acute abnormality. Hepatobiliary: No focal liver abnormality is seen. No gallstones, gallbladder wall thickening, or biliary dilatation. Pancreas: Unremarkable. No pancreatic ductal dilatation or surrounding inflammatory changes. Spleen: Normal in size without focal abnormality. Adrenals/Urinary Tract: A stable 16 mm x 10 mm isodense (approximately 94.31 Hounsfield units) left adrenal mass is seen. The right adrenal gland is unremarkable. Kidneys are normal, without renal calculi, focal lesion, or hydronephrosis. The urinary bladder is  poorly distended and subsequently limited in evaluation. Stomach/Bowel: Stomach is within normal limits. Appendix appears normal. No evidence of bowel wall thickening, distention, or inflammatory changes. Noninflamed diverticula are seen within the ascending colon. Vascular/Lymphatic: Aortic atherosclerosis. No enlarged abdominal or pelvic lymph nodes. Reproductive: Status post hysterectomy. No adnexal masses. Other: No abdominal wall hernia or abnormality. No abdominopelvic ascites. Musculoskeletal: Postoperative changes are seen within the lower lumbar spine. IMPRESSION: 1. No acute or active process within the abdomen or pelvis. 2. Stable left adrenal adenoma. No follow-up imaging is recommended. This recommendation follows ACR consensus guidelines: Management of Incidental Adrenal Masses: A White Paper of the ACR Incidental Findings Committee. J Am Coll Radiol 2017;14:1038-1044. 3.  Colonic diverticulosis. 4. Postoperative changes within the lower lumbar spine. 5. Aortic atherosclerosis. Aortic Atherosclerosis (ICD10-I70.0). Electronically Signed   By: Aram Candela M.D.   On: 01/21/2022 22:04   CT Head Wo Contrast  Result Date: 01/21/2022 CLINICAL DATA:  Altered mental status. EXAM: CT HEAD WITHOUT CONTRAST TECHNIQUE: Contiguous axial images were obtained from the base of the skull through the vertex without intravenous contrast. RADIATION DOSE REDUCTION: This exam was performed according to the departmental dose-optimization program which includes automated exposure control, adjustment of the mA and/or kV according to patient size and/or use of iterative reconstruction technique. COMPARISON:  Head CT dated 11/20/2021. FINDINGS: Brain: The ventricles and sulci are appropriate size for the patient's age. The gray-white matter discrimination is preserved. There is no acute intracranial hemorrhage. No mass effect or midline shift. No extra-axial fluid collection. Vascular: No hyperdense vessel or unexpected calcification. Skull: Normal. Negative for fracture or focal lesion. Sinuses/Orbits: No acute finding. There is dysconjugate gaze which may represent strabismus. Other: None IMPRESSION: No acute intracranial pathology. Electronically Signed   By: Elgie Collard M.D.   On: 01/21/2022 21:10       LOS: 1 day   Kristen Ramos  [facility] Pager on [email or web address]  01/23/2022, 9:35 AM

## 2022-01-24 DIAGNOSIS — R112 Nausea with vomiting, unspecified: Secondary | ICD-10-CM | POA: Diagnosis not present

## 2022-01-24 MED ORDER — SODIUM CHLORIDE 0.9 % IV SOLN: INTRAVENOUS | Status: AC

## 2022-01-24 NOTE — Progress Notes (Signed)
[facility] PROGRESS NOTE   Kristen Ramos [id number] DOB: June 19, 1960 DOA: 01/21/2022  PCP: Mindi Curling, PA-C  Brief History/Interval Summary: 61 y.o. 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  CT of the abdomen pelvis was unremarkable.  Patient was given symptomatic treatment with no improvement.  Subsequently she was hospitalized for further management.  Patient stated that she has previously been seen by gastroenterology with [facility] for this problem and they have not been able to find the etiology of her symptoms.    Consultants: None  Procedures: None    Subjective/Interval History: Patient mentioned that she is feeling better.  Had only 1 episode of vomiting in the last 24 hours.  Abdominal pain is improving.     Assessment/Plan:  Intractable nausea and vomiting/history of cyclical vomiting syndrome CT scan was negative for acute findings. Patient has been started on Reglan on a scheduled basis.  Continue PPI twice a day.   Gastric emptying study from 2020 did not suggest gastroparesis.  Upper endoscopy in 2020 suggested gastritis.  She is followed by gastroenterology in [facility].  Based on care everywhere it looks like her symptoms were controlled with the meclizine.   Patient remains on meclizine Yesterday the ondansetron was switched over to Phenergan.  Seems to be helping as her symptoms are better.  Continue with clear liquids for today.  May advance to full liquids later today if she is not having episodes of vomiting this morning.  Patient has been asked to mobilize herself.  Sit up in the chair and ambulate in the hallway.    Transient altered mentation Seems to be back to baseline.  No  evidence for neurological deficits.  Abnormal UA Denies any dysuria.  Several squamous epithelial cells noted.  Likely a contaminated sample.  Antibiotics were not continued.  Rheumatoid arthritis Methotrexate has been stopped previously as it was thought to be causing PRES. Followed by rheumatology in the outpatient setting.  Seizure disorder Continue Vimpat.  Currently being dosed intravenously.  Elevated blood pressure readings No previous history of hypertension.  Elevated blood pressure could be situational.  Blood pressure appears to have improved.  Continue to monitor.  Normal anion gap metabolic acidosis Possibly due to hypovolemia.  Had improved.  Recheck labs tomorrow.  Chronic low back pain Prescriber database reviewed.  Oxycodone was last prescribed on 10/18, 120 tablets.  GERD PPI  DVT Prophylaxis: Lovenox Code Status: Full code Family Communication: Discussed with patient Disposition Plan: Mobilize.  Hopefully return home when improved  Status is: Inpatient Remains inpatient appropriate because: Intractable nausea and vomiting      Medications: Scheduled:  enoxaparin (LOVENOX) injection  40 mg Subcutaneous [id number]   folic acid  1 mg Oral Daily   gabapentin  600 mg Oral BID   metoCLOPramide (REGLAN) injection  5 mg Intravenous Q6H   pantoprazole (PROTONIX) IV  40 mg Intravenous Q12H   Continuous:  sodium chloride 50 mL/hr at 01/23/22 2137   lacosamide (VIMPAT) IV 50 mg (01/23/22 2138)   promethazine (PHENERGAN) injection (IM or IVPB)     [id number] **OR** acetaminophen, benzonatate, hydrALAZINE, meclizine, oxyCODONE, promethazine (PHENERGAN) injection (IM or IVPB)  Antibiotics: Anti-infectives (From admission, onward)    Start     Dose/Rate Route Frequency Ordered Stop   01/21/22 2345  cefTRIAXone (ROCEPHIN) 1 g in sodium chloride 0.9 % 100 mL IVPB  Status:  Discontinued        1 g 200 mL/hr over 30 Minutes Intravenous  Once 01/21/22 2330  01/23/22 0935       Objective:  Vital Signs  Vitals:   01/23/22 2058 01/24/22 0033 01/24/22 0501 01/24/22 0727  BP: 117/76 (!) 102/54 109/63 (!) 97/54  Pulse: (!) 101 69 69 73  Resp: 18 18 16 16   Temp: 98.2 F (36.8 C) 98.3 F (36.8 C) 98.2 F (36.8 C) 98.1 F (36.7 C)  TempSrc: Oral Oral Oral Oral  SpO2: 92% 92% 96% 91%  Weight:      Height:        Intake/Output Summary (Last 24 hours) at 01/24/2022 0855 Last data filed at 01/23/2022 1802 Gross per 24 hour  Intake 440 ml  Output --  Net 440 ml    Filed Weights   01/22/22 0604  Weight: 72.6 kg    General appearance: Awake alert.  In no distress Resp: Clear to auscultation bilaterally.  Normal effort Cardio: S1-S2 is normal regular.  No S3-S4.  No rubs murmurs or bruit GI: Abdomen is soft.  Less tender today compared to yesterday.  No rebound rigidity or guarding.  No masses organomegaly. Extremities: No edema.  Full range of motion of lower extremities. Neurologic: Alert and oriented x3.  No focal neurological deficits.    Lab Results:  Data Reviewed: I have personally reviewed following labs and reports of the imaging studies  CBC: Recent Labs  Lab 01/21/22 1955 01/21/22 2019 01/23/22 0259  WBC 7.6  --  8.9  NEUTROABS 5.1  --   --   HGB 14.6 15.0 14.1  HCT 43.6 44.0 41.8  MCV 88.6  --  88.4  PLT 443*  --  379     Basic Metabolic Panel: Recent Labs  Lab 01/21/22 1955 01/21/22 2019 01/22/22 0415 01/23/22 0259  NA 140 142 140 138  K 3.6 3.8 3.5 3.6  CL 108 111 107 109  CO2 17*  --  19* 21*  GLUCOSE 115* 117* 145* 105*  BUN 9 10 6 6   CREATININE 0.97 0.70 0.66 0.79  CALCIUM 9.7  --  9.1 9.1  MG  --   --   --  1.9     GFR: Estimated Creatinine Clearance: 77.2 mL/min (by C-G formula based on SCr of 0.79 mg/dL).  Liver Function Tests: Recent Labs  Lab 01/21/22 1955  AST 27  ALT 20  ALKPHOS 88  BILITOT 0.6  PROT 9.2*  ALBUMIN 3.4*     Recent Labs  Lab 01/21/22 1955   AMMONIA 16      Radiology Studies: No results found.     LOS: 2 days   Kristen Ramos 13/12/23  [facility] Pager on [email or web address]  01/24/2022, 8:55 AM

## 2022-01-25 DIAGNOSIS — E872 Acidosis, unspecified: Secondary | ICD-10-CM

## 2022-01-25 DIAGNOSIS — R112 Nausea with vomiting, unspecified: Secondary | ICD-10-CM | POA: Diagnosis not present

## 2022-01-25 DIAGNOSIS — R1115 Cyclical vomiting syndrome unrelated to migraine: Secondary | ICD-10-CM

## 2022-01-25 LAB — BASIC METABOLIC PANEL
Anion gap: 9 (ref 5–15)
BUN: 7 mg/dL (ref 6–20)
CO2: 23 mmol/L (ref 22–32)
Calcium: 8.7 mg/dL — ABNORMAL LOW (ref 8.9–10.3)
Chloride: 108 mmol/L (ref 98–111)
Creatinine, Ser: 0.66 mg/dL (ref 0.44–1.00)
GFR, Estimated: >60 mL/min (ref >60)
Glucose, Bld: 96 mg/dL (ref 70–99)
Potassium: 3.4 mmol/L — ABNORMAL LOW (ref 3.5–5.1)
Sodium: 140 mmol/L (ref 135–145)

## 2022-01-25 LAB — CBC
HCT: 33.3 % — ABNORMAL LOW (ref 36.0–46.0)
Hemoglobin: 11.2 g/dL — ABNORMAL LOW (ref 12.0–15.0)
MCH: 30.2 pg (ref 26.0–34.0)
MCHC: 33.6 g/dL (ref 30.0–36.0)
MCV: 89.8 fL (ref 80.0–100.0)
Platelets: 264 10*3/uL (ref 150–400)
RBC: 3.71 MIL/uL — ABNORMAL LOW (ref 3.87–5.11)
RDW: 13.2 % (ref 11.5–15.5)
WBC: 5.4 10*3/uL (ref 4.0–10.5)
nRBC: 0 % (ref 0.0–0.2)

## 2022-01-25 MED ORDER — PANTOPRAZOLE SODIUM 40 MG PO TBEC: 40.0000 mg | DELAYED_RELEASE_TABLET | Freq: Every day | ORAL | 2 refills | Status: AC

## 2022-01-25 MED ORDER — PROMETHAZINE HCL 25 MG PO TABS: 25.0000 mg | ORAL_TABLET | Freq: Four times a day (QID) | ORAL | 1 refills | Status: DC | PRN

## 2022-01-25 MED ORDER — METOCLOPRAMIDE HCL 5 MG PO TABS: 5.0000 mg | ORAL_TABLET | Freq: Three times a day (TID) | ORAL | 1 refills | Status: DC

## 2022-01-25 MED ORDER — BENZONATATE 100 MG PO CAPS: 100.0000 mg | ORAL_CAPSULE | Freq: Three times a day (TID) | ORAL | 0 refills | Status: DC | PRN

## 2022-01-25 NOTE — Progress Notes (Signed)
Pt discharged home in stable condition 

## 2022-01-25 NOTE — Discharge Summary (Signed)
Physician Discharge Summary   Patient: Kristen Ramos MRN: [id number] DOB: December 13, 1960  Admit date:     01/21/2022  Discharge date: 01/25/22  Discharge Physician: Thad Ranger, MD    PCP: Jordan Hawks, PA-C   Recommendations at discharge:   Continue Reglan 5 mg p.o. 3 times daily before meals Continue Phenergan as needed for refractory nausea and vomiting, states helping better than the Zofran.  Discharge Diagnoses:    Intractable nausea and vomiting   Cyclic vomiting syndrome   Rheumatoid arthritis (HCC)   GERD (gastroesophageal reflux disease)   Seizure disorder (HCC)   Metabolic acidosis    Hospital Course:  61 y.o. 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  CT of the abdomen pelvis was unremarkable.  Patient was given symptomatic treatment with no improvement.  Subsequently she was hospitalized for further management.  Patient stated that she has previously been seen by gastroenterology with [facility] for this problem and they have not been able to find the etiology of her symptoms.     Assessment and Plan:    Intractable nausea and vomiting/history of cyclical vomiting syndrome - CT scan was negative for acute findings. -On IV Reglan scheduled and PPI,  -Prior GI work-up includes gastric emptying study from 2020 did not suggest gastroparesis.  Upper endoscopy in 2020 suggested gastritis.  She is followed by gastroenterology in [facility].  Based on care everywhere it looks like her symptoms were controlled with the meclizine.   Patient remains on meclizine -Zofran was switched over to Phenergan and patient reported that it worked well. -Tolerating diet, ambulating, wants to go home    Transient altered  mentation -Back to baseline, no focal neurological deficits   Abnormal UA - Denies any dysuria.  Several squamous epithelial cells noted.  Likely a contaminated sample.  Antibiotics were not continued.   Rheumatoid arthritis Methotrexate has been stopped previously as it was thought to be causing PRES. -Continue to follow rheumatology outpatient  Seizure disorder Continue Vimpat   Elevated blood pressure readings No previous history of hypertension.  BP now stable  Hypokalemia -Replaced   Normal anion gap metabolic acidosis Possibly due to hypovolemia, resolved   Chronic low back pain Prescriber database reviewed.  Oxycodone was last prescribed on 10/18, 120 tablets, continue.   GERD Continue Protonix       Pain control - [place] Controlled Substance Reporting System database was reviewed. and patient was instructed, not to drive, operate heavy machinery, perform activities at heights, swimming or participation in water activities or provide baby-sitting services while on Pain, Sleep and Anxiety Medications; until their outpatient Physician has advised to do so again. Also recommended to not to take more than prescribed Pain, Sleep and Anxiety Medications.  Consultants none Procedures performed: None Disposition: Home Diet recommendation:  Discharge Diet Orders (From admission, onward)     Start     Ordered   01/25/22 0000  Diet - low sodium heart healthy        01/25/22 4098           Soft diet    DISCHARGE MEDICATION: Allergies as of 01/25/2022       Reactions   Bee Venom Hives, Swelling        Medication List     STOP taking these  medications    ondansetron 4 MG disintegrating tablet Commonly known as: ZOFRAN-ODT       TAKE these medications    benzonatate 100 MG capsule Commonly known as: TESSALON Take 1 capsule (100 mg total) by mouth 3 (three) times daily as needed for cough.   folic acid 1 MG tablet Commonly known as:  FOLVITE Take 1 mg by mouth daily.   Horizant 600 MG Tbcr Generic drug: Gabapentin Enacarbil Take 600 mg by mouth 2 (two) times daily.   lacosamide 50 MG Tabs tablet Commonly known as: VIMPAT Take 1 tablet (50 mg total) by mouth 2 (two) times daily.   meclizine 25 MG tablet Commonly known as: ANTIVERT Take 1 tablet (25 mg total) by mouth 3 (three) times daily as needed for dizziness.   metoCLOPramide 5 MG tablet Commonly known as: Reglan Take 1 tablet (5 mg total) by mouth 3 (three) times daily before meals.   oxyCODONE 15 MG immediate release tablet Commonly known as: ROXICODONE Take 1 tablet (15 mg total) by mouth 4 (four) times daily as needed for pain. What changed: when to take this   pantoprazole 40 MG tablet Commonly known as: PROTONIX Take 1 tablet (40 mg total) by mouth daily.   polyethylene glycol 17 g packet Commonly known as: MIRALAX / GLYCOLAX Take 17 g by mouth daily. What changed:  when to take this reasons to take this   promethazine 25 MG tablet Commonly known as: PHENERGAN Take 1 tablet (25 mg total) by mouth every 6 (six) hours as needed for refractory nausea / vomiting.   VITAMIN D-3 PO Take 1 capsule by mouth daily.        Follow-up Information     Jordan Hawks, PA-C. Schedule an appointment as soon as possible for a visit in 2 week(s).   Specialty: Physician Assistant Why: for hospital follow-up Contact information: [address] [place] [address] [phone]                Discharge Exam: Ceasar Mons Weights   01/22/22 0604  Weight: 72.6 kg   S: States that she is feeling much better today, close to her baseline, wants to go home   Vitals:   01/24/22 1616 01/24/22 2120 01/25/22 0621 01/25/22 0757  BP: (!) 99/54 135/81 92/60 (!) 105/54  Pulse: 80 70 78 75  Resp: 18   16  Temp: 98.1 F (36.7 C) 98.1 F (36.7 C) 98 F (36.7 C) 97.7 F (36.5 C)  TempSrc: Oral Oral  Oral  SpO2: 99% 99% 96% 99%   Weight:      Height:        Physical Exam General: Alert and oriented x 3, NAD Cardiovascular: S1 S2 clear, RRR.  Respiratory: CTAB, no wheezing, rales or rhonchi Gastrointestinal: Soft, nontender, nondistended, NBS Ext: no pedal edema bilaterally Neuro: no new deficits Psych: Normal affect and demeanor, alert and oriented x3    Condition at discharge: good  The results of significant diagnostics from this hospitalization (including imaging, microbiology, ancillary and laboratory) are listed below for reference.   Imaging Studies: CT ABDOMEN PELVIS W CONTRAST  Result Date: 01/21/2022 CLINICAL DATA:  Abdominal pain. EXAM: CT ABDOMEN AND PELVIS WITH CONTRAST TECHNIQUE: Multidetector CT imaging of the abdomen and pelvis was performed using the standard protocol following bolus administration of intravenous contrast. RADIATION DOSE REDUCTION: This exam was performed according to the departmental dose-optimization program which includes automated exposure control, adjustment of the mA and/or kV  according to patient size and/or use of iterative reconstruction technique. CONTRAST:  73mL OMNIPAQUE IOHEXOL 350 MG/ML SOLN COMPARISON:  October 23, 2021 and February 12, 2018 FINDINGS: Lower chest: No acute abnormality. Hepatobiliary: No focal liver abnormality is seen. No gallstones, gallbladder wall thickening, or biliary dilatation. Pancreas: Unremarkable. No pancreatic ductal dilatation or surrounding inflammatory changes. Spleen: Normal in size without focal abnormality. Adrenals/Urinary Tract: A stable 16 mm x 10 mm isodense (approximately 94.31 Hounsfield units) left adrenal mass is seen. The right adrenal gland is unremarkable. Kidneys are normal, without renal calculi, focal lesion, or hydronephrosis. The urinary bladder is poorly distended and subsequently limited in evaluation. Stomach/Bowel: Stomach is within normal limits. Appendix appears normal. No evidence of bowel wall thickening,  distention, or inflammatory changes. Noninflamed diverticula are seen within the ascending colon. Vascular/Lymphatic: Aortic atherosclerosis. No enlarged abdominal or pelvic lymph nodes. Reproductive: Status post hysterectomy. No adnexal masses. Other: No abdominal wall hernia or abnormality. No abdominopelvic ascites. Musculoskeletal: Postoperative changes are seen within the lower lumbar spine. IMPRESSION: 1. No acute or active process within the abdomen or pelvis. 2. Stable left adrenal adenoma. No follow-up imaging is recommended. This recommendation follows ACR consensus guidelines: Management of Incidental Adrenal Masses: A White Paper of the ACR Incidental Findings Committee. J Am Coll Radiol 2017;14:1038-1044. 3. Colonic diverticulosis. 4. Postoperative changes within the lower lumbar spine. 5. Aortic atherosclerosis. Aortic Atherosclerosis (ICD10-I70.0). Electronically Signed   By: Aram Candela M.D.   On: 01/21/2022 22:04   CT Head Wo Contrast  Result Date: 01/21/2022 CLINICAL DATA:  Altered mental status. EXAM: CT HEAD WITHOUT CONTRAST TECHNIQUE: Contiguous axial images were obtained from the base of the skull through the vertex without intravenous contrast. RADIATION DOSE REDUCTION: This exam was performed according to the departmental dose-optimization program which includes automated exposure control, adjustment of the mA and/or kV according to patient size and/or use of iterative reconstruction technique. COMPARISON:  Head CT dated 11/20/2021. FINDINGS: Brain: The ventricles and sulci are appropriate size for the patient's age. The gray-white matter discrimination is preserved. There is no acute intracranial hemorrhage. No mass effect or midline shift. No extra-axial fluid collection. Vascular: No hyperdense vessel or unexpected calcification. Skull: Normal. Negative for fracture or focal lesion. Sinuses/Orbits: No acute finding. There is dysconjugate gaze which may represent strabismus.  Other: None IMPRESSION: No acute intracranial pathology. Electronically Signed   By: Elgie Collard M.D.   On: 01/21/2022 21:10   CARDIAC EVENT MONITOR  Result Date: 01/03/2022 Normal sinus rhythm Rare ectopy Symptoms of passing out, dizziness, tired, fatigued all occurred with normal sinus rhythm No sustained arrhythmia   Microbiology: Results for orders placed or performed during the hospital encounter of 11/17/21  Resp Panel by RT-PCR (Flu A&B, Covid) Anterior Nasal Swab     Status: None   Collection Time: 11/18/21 12:56 AM   Specimen: Anterior Nasal Swab  Result Value Ref Range Status   SARS Coronavirus 2 by RT PCR NEGATIVE NEGATIVE Final    Comment: (NOTE) SARS-CoV-2 target nucleic acids are NOT DETECTED.  The SARS-CoV-2 RNA is generally detectable in upper respiratory specimens during the acute phase of infection. The lowest concentration of SARS-CoV-2 viral copies this assay can detect is 138 copies/mL. A negative result does not preclude SARS-Cov-2 infection and should not be used as the sole basis for treatment or other patient management decisions. A negative result may occur with  improper specimen collection/handling, submission of specimen other than nasopharyngeal swab, presence of viral mutation(s) within the areas  targeted by this assay, and inadequate number of viral copies(<138 copies/mL). A negative result must be combined with clinical observations, patient history, and epidemiological information. The expected result is Negative.  Fact Sheet for Patients:  BloggerCourse.com  Fact Sheet for Healthcare Providers:  SeriousBroker.it  This test is no t yet approved or cleared by the [place] FDA and  has been authorized for detection and/or diagnosis of SARS-CoV-2 by FDA under an Emergency Use Authorization (EUA). This EUA will remain  in effect (meaning this test can be used) for the duration of  the COVID-19 declaration under Section 564(b)(1) of the Act, 21 U.S.C.section 360bbb-3(b)(1), unless the authorization is terminated  or revoked sooner.       Influenza A by PCR NEGATIVE NEGATIVE Final   Influenza B by PCR NEGATIVE NEGATIVE Final    Comment: (NOTE) The Xpert Xpress SARS-CoV-2/FLU/RSV plus assay is intended as an aid in the diagnosis of influenza from Nasopharyngeal swab specimens and should not be used as a sole basis for treatment. Nasal washings and aspirates are unacceptable for Xpert Xpress SARS-CoV-2/FLU/RSV testing.  Fact Sheet for Patients: BloggerCourse.com  Fact Sheet for Healthcare Providers: SeriousBroker.it  This test is not yet approved or cleared by the [place] FDA and has been authorized for detection and/or diagnosis of SARS-CoV-2 by FDA under an Emergency Use Authorization (EUA). This EUA will remain in effect (meaning this test can be used) for the duration of the COVID-19 declaration under Section 564(b)(1) of the Act, 21 U.S.C. section 360bbb-3(b)(1), unless the authorization is terminated or revoked.  Performed at [facility], [address], [place], [place] [address]     Labs: CBC: Recent Labs  Lab 01/21/22 1955 01/21/22 2019 01/23/22 0259 01/25/22 0258  WBC 7.6  --  8.9 5.4  NEUTROABS 5.1  --   --   --   HGB 14.6 15.0 14.1 11.2*  HCT 43.6 44.0 41.8 33.3*  MCV 88.6  --  88.4 89.8  PLT 443*  --  379 264   Basic Metabolic Panel: Recent Labs  Lab 01/21/22 1955 01/21/22 2019 01/22/22 0415 01/23/22 0259 01/25/22 0258  NA 140 142 140 138 140  K 3.6 3.8 3.5 3.6 3.4*  CL 108 111 107 109 108  CO2 17*  --  19* 21* 23  GLUCOSE 115* 117* 145* 105* 96  BUN 9 10 6 6 7   CREATININE 0.97 0.70 0.66 0.79 0.66  CALCIUM 9.7  --  9.1 9.1 8.7*  MG  --   --   --  1.9  --    Liver Function Tests: Recent Labs  Lab 01/21/22 1955  AST 27  ALT 20  ALKPHOS 88   BILITOT 0.6  PROT 9.2*  ALBUMIN 3.4*   CBG: No results for input(s): "GLUCAP" in the last 168 hours.  Discharge time spent: greater than 30 minutes.  Signed: 13/12/23, MD [facility] 01/25/2022

## 2022-01-25 NOTE — Care Management Important Message (Signed)
Important Message  Patient Details  Name: Kristen Ramos MRN: [id number] Date of Birth: 10/25/1960   Medicare Important Message Given:  Yes     Sherilyn Banker 01/25/2022, 10:51 AM

## 2022-02-13 ENCOUNTER — Ambulatory Visit: Payer: Medicare (Managed Care)

## 2022-11-03 IMAGING — DX DG ABD PORTABLE 1V
1 series · 2 of 2 positions shown · non-contrast
Comparison: Abdominal radiograph 06/16/2019.

CLINICAL DATA: 60-year-old female with history of nausea and
vomiting.

EXAM:
PORTABLE ABDOMEN - 1 VIEW

[Series 1: abdomen · 0.14mm/px · 2 of 2 slices shown]
[im 1/2]
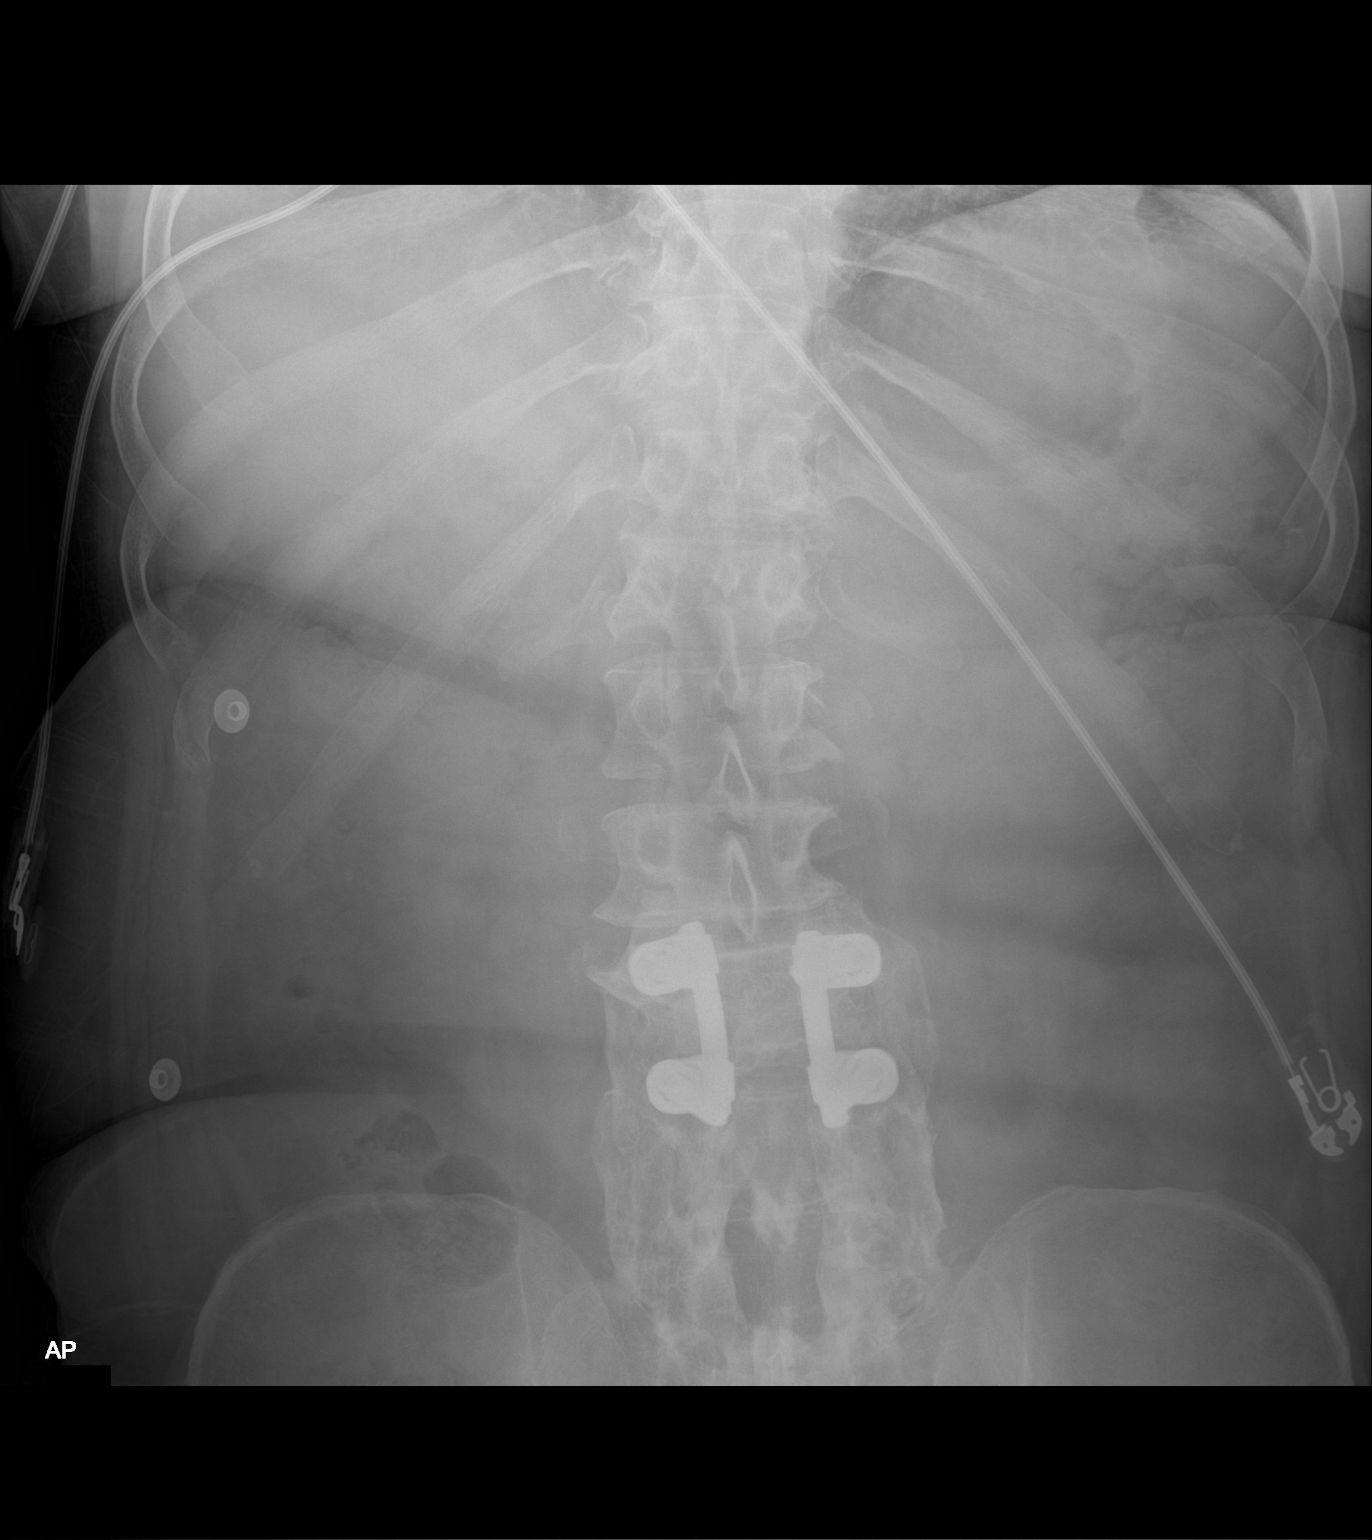
[im 2/2]
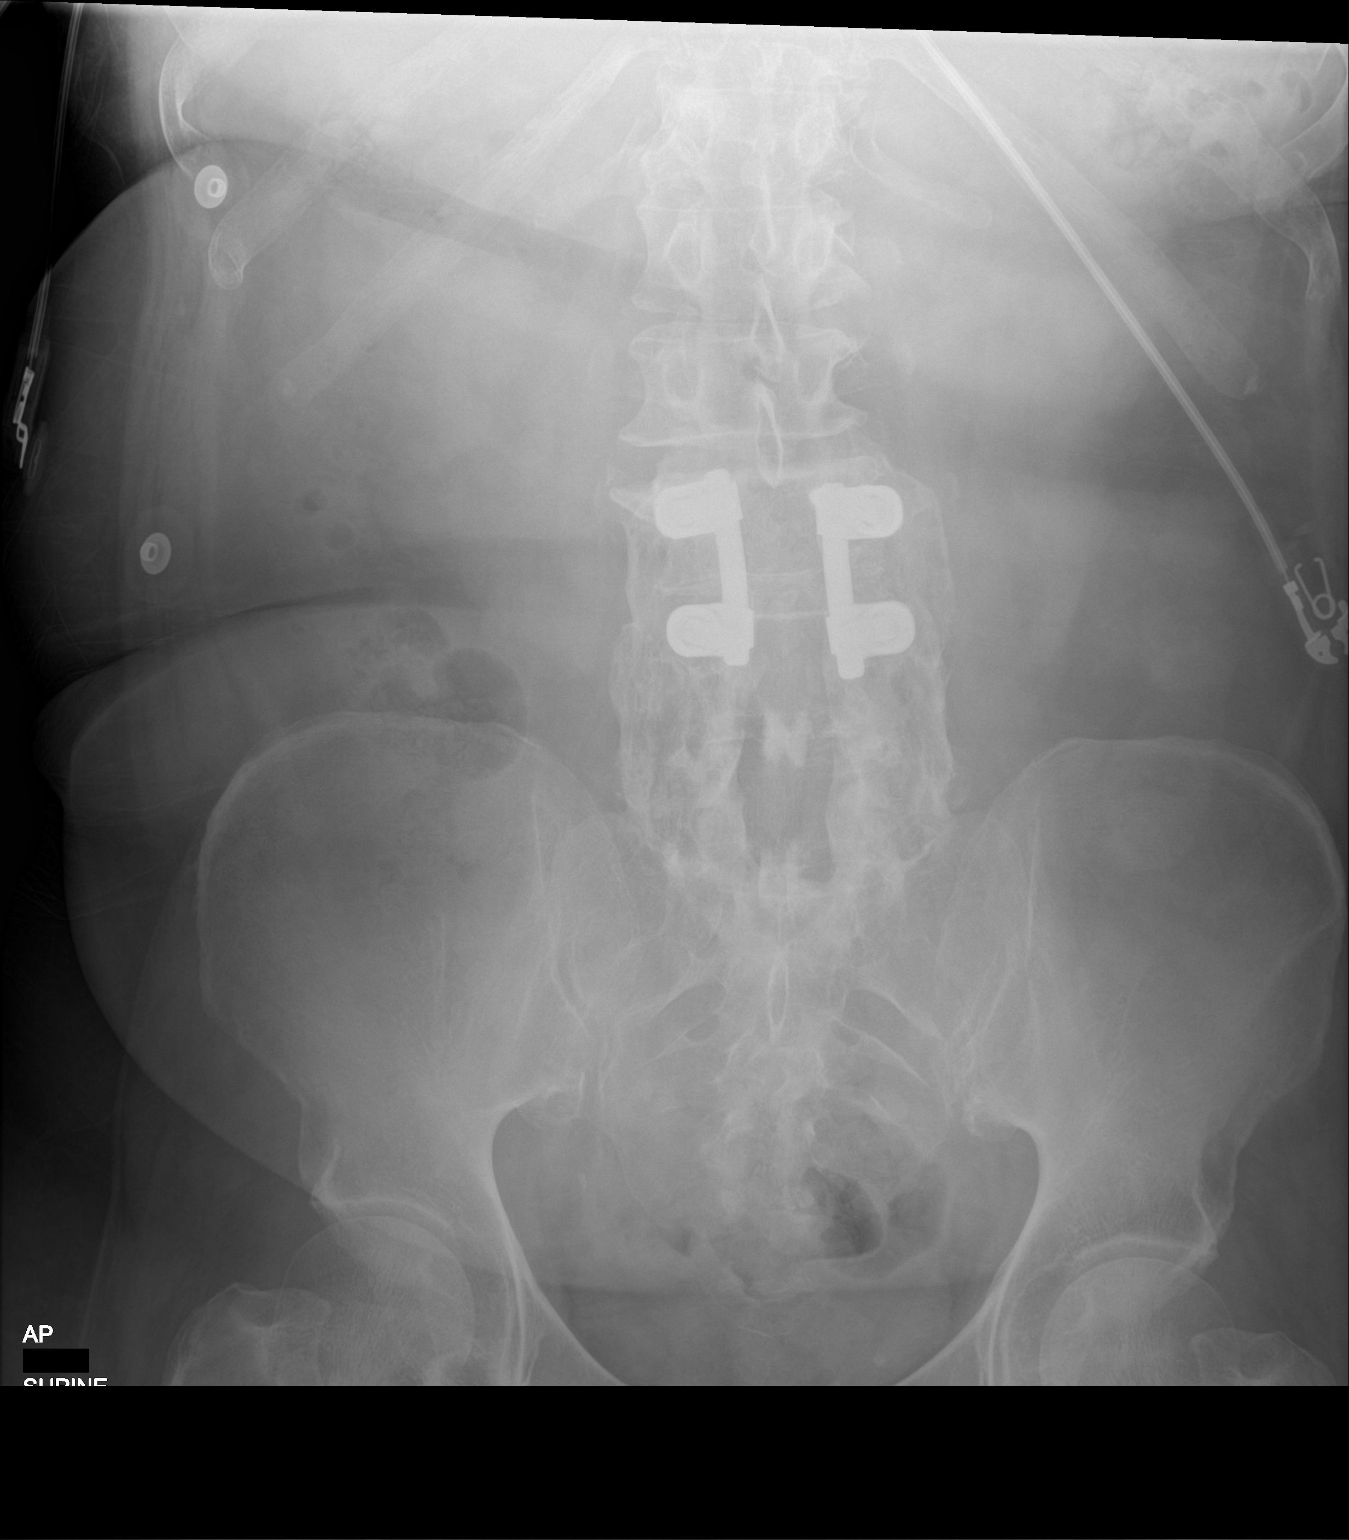

[2 of 2 positions shown; findings below may reference images not displayed]

FINDINGS: Gas and stool are seen scattered throughout the colon extending to
the level of the distal rectum. No pathologic distension of small
bowel is noted. No gross evidence of pneumoperitoneum. Orthopedic
fixation hardware in the lumbar spine incidentally noted.
IMPRESSION: 1. Nonobstructive bowel gas pattern.
2. No pneumoperitoneum.

## 2023-01-04 LAB — COLOGUARD: COLOGUARD: POSITIVE — AB

## 2023-01-04 LAB — EXTERNAL GENERIC LAB PROCEDURE: COLOGUARD: POSITIVE — AB

## 2023-05-22 DIAGNOSIS — Z1231 Encounter for screening mammogram for malignant neoplasm of breast: Secondary | ICD-10-CM

## 2023-12-09 ENCOUNTER — Other Ambulatory Visit: Payer: Self-pay

## 2023-12-09 ENCOUNTER — Observation Stay (HOSPITAL_COMMUNITY)

## 2023-12-09 ENCOUNTER — Inpatient Hospital Stay (HOSPITAL_COMMUNITY)
Admission: EM | Admit: 2023-12-09 | Discharge: 2023-12-11 | DRG: 394 | Disposition: A | Discharge status: Home / Self Care

## 2023-12-09 DIAGNOSIS — G40909 Epilepsy, unspecified, not intractable, without status epilepticus: Secondary | ICD-10-CM | POA: Diagnosis present

## 2023-12-09 DIAGNOSIS — E785 Hyperlipidemia, unspecified: Secondary | ICD-10-CM | POA: Diagnosis present

## 2023-12-09 DIAGNOSIS — E872 Acidosis, unspecified: Secondary | ICD-10-CM | POA: Diagnosis present

## 2023-12-09 DIAGNOSIS — E876 Hypokalemia: Secondary | ICD-10-CM | POA: Diagnosis present

## 2023-12-09 DIAGNOSIS — K219 Gastro-esophageal reflux disease without esophagitis: Secondary | ICD-10-CM | POA: Diagnosis present

## 2023-12-09 DIAGNOSIS — Z90710 Acquired absence of both cervix and uterus: Secondary | ICD-10-CM

## 2023-12-09 DIAGNOSIS — Z91030 Bee allergy status: Secondary | ICD-10-CM

## 2023-12-09 DIAGNOSIS — R1115 Cyclical vomiting syndrome unrelated to migraine: Secondary | ICD-10-CM | POA: Diagnosis not present

## 2023-12-09 DIAGNOSIS — F17210 Nicotine dependence, cigarettes, uncomplicated: Secondary | ICD-10-CM | POA: Diagnosis present

## 2023-12-09 DIAGNOSIS — Z79899 Other long term (current) drug therapy: Secondary | ICD-10-CM

## 2023-12-09 DIAGNOSIS — R112 Nausea with vomiting, unspecified: Secondary | ICD-10-CM | POA: Diagnosis not present

## 2023-12-09 DIAGNOSIS — M069 Rheumatoid arthritis, unspecified: Secondary | ICD-10-CM | POA: Diagnosis present

## 2023-12-09 DIAGNOSIS — M549 Dorsalgia, unspecified: Secondary | ICD-10-CM | POA: Diagnosis present

## 2023-12-09 DIAGNOSIS — G8929 Other chronic pain: Secondary | ICD-10-CM | POA: Diagnosis present

## 2023-12-09 DIAGNOSIS — Z789 Other specified health status: Secondary | ICD-10-CM

## 2023-12-09 DIAGNOSIS — I10 Essential (primary) hypertension: Secondary | ICD-10-CM | POA: Diagnosis present

## 2023-12-09 LAB — COMPREHENSIVE METABOLIC PANEL WITH GFR
ALT: 15 U/L (ref 0–44)
AST: 20 U/L (ref 15–41)
Albumin: 4.4 g/dL (ref 3.5–5.0)
Alkaline Phosphatase: 88 U/L (ref 38–126)
Anion gap: 14 (ref 5–15)
BUN: 8 mg/dL (ref 8–23)
CO2: 15 mmol/L — ABNORMAL LOW (ref 22–32)
Calcium: 9.5 mg/dL (ref 8.9–10.3)
Chloride: 107 mmol/L (ref 98–111)
Creatinine, Ser: 0.74 mg/dL (ref 0.44–1.00)
GFR, Estimated: >60 mL/min (ref >60)
Glucose, Bld: 136 mg/dL — ABNORMAL HIGH (ref 70–99)
Potassium: 3.5 mmol/L (ref 3.5–5.1)
Sodium: 136 mmol/L (ref 135–145)
Total Bilirubin: 0.7 mg/dL (ref 0.0–1.2)
Total Protein: 8.3 g/dL — ABNORMAL HIGH (ref 6.5–8.1)

## 2023-12-09 LAB — CBC WITH DIFFERENTIAL/PLATELET
Abs Immature Granulocytes: 0.04 K/uL (ref 0.00–0.07)
Basophils Absolute: 0.1 K/uL (ref 0.0–0.1)
Basophils Relative: 1 %
Eosinophils Absolute: 0 K/uL (ref 0.0–0.5)
Eosinophils Relative: 0 %
HCT: 40.4 % (ref 36.0–46.0)
Hemoglobin: 13.6 g/dL (ref 12.0–15.0)
Immature Granulocytes: 1 %
Lymphocytes Relative: 10 %
Lymphs Abs: 0.9 K/uL (ref 0.7–4.0)
MCH: 31 pg (ref 26.0–34.0)
MCHC: 33.7 g/dL (ref 30.0–36.0)
MCV: 92 fL (ref 80.0–100.0)
Monocytes Absolute: 0.2 K/uL (ref 0.1–1.0)
Monocytes Relative: 3 %
Neutro Abs: 7.5 K/uL (ref 1.7–7.7)
Neutrophils Relative %: 85 %
Platelets: 262 K/uL (ref 150–400)
RBC: 4.39 MIL/uL (ref 3.87–5.11)
RDW: 14.2 % (ref 11.5–15.5)
WBC: 8.6 K/uL (ref 4.0–10.5)
nRBC: 0 % (ref 0.0–0.2)

## 2023-12-09 LAB — URINALYSIS, ROUTINE W REFLEX MICROSCOPIC
Bilirubin Urine: NEGATIVE
Glucose, UA: NEGATIVE mg/dL
Hgb urine dipstick: NEGATIVE
Ketones, ur: 20 mg/dL — AB
Leukocytes,Ua: NEGATIVE
Nitrite: NEGATIVE
Protein, ur: 30 mg/dL — AB
Specific Gravity, Urine: 1.029 (ref 1.005–1.030)
pH: 5 (ref 5.0–8.0)

## 2023-12-09 LAB — I-STAT CG4 LACTIC ACID, ED
Lactic Acid, Venous: 2 mmol/L (ref 0.5–1.9)
Lactic Acid, Venous: 1.7 mmol/L (ref 0.5–1.9)

## 2023-12-09 LAB — SALICYLATE LEVEL: Salicylate Lvl: <7 mg/dL — ABNORMAL LOW (ref 7.0–30.0)

## 2023-12-09 LAB — LIPASE, BLOOD: Lipase: 18 U/L (ref 11–51)

## 2023-12-09 LAB — MAGNESIUM: Magnesium: 1.7 mg/dL (ref 1.7–2.4)

## 2023-12-09 LAB — ETHANOL: Alcohol, Ethyl (B): <15 mg/dL (ref <15)

## 2023-12-09 MED ORDER — ENOXAPARIN SODIUM 40 MG/0.4ML IJ SOSY
40.0000 mg | PREFILLED_SYRINGE | INTRAMUSCULAR | Status: DC
Start: 2023-12-09 — End: 2023-12-11
  Administered 2023-12-09 – 2023-12-10 (×2): 40 mg via SUBCUTANEOUS
  Filled 2023-12-09 (×2): qty 0.4

## 2023-12-09 MED ORDER — METOCLOPRAMIDE HCL 5 MG/ML IJ SOLN
5.0000 mg | Freq: Four times a day (QID) | INTRAMUSCULAR | Status: DC
  Administered 2023-12-09 – 2023-12-10 (×3): 5 mg via INTRAVENOUS
  Filled 2023-12-09 (×3): qty 2

## 2023-12-09 MED ORDER — LACOSAMIDE 50 MG PO TABS
50.0000 mg | ORAL_TABLET | Freq: Two times a day (BID) | ORAL | Status: DC
Start: 2023-12-09 — End: 2023-12-09

## 2023-12-09 MED ORDER — DEXTROSE IN LACTATED RINGERS 5 % IV SOLN: INTRAVENOUS | Status: DC

## 2023-12-09 MED ORDER — SODIUM CHLORIDE 0.9 % IV SOLN
25.0000 mg | Freq: Four times a day (QID) | INTRAVENOUS | Status: DC | PRN
  Administered 2023-12-09: 25 mg via INTRAVENOUS
  Filled 2023-12-09: qty 1

## 2023-12-09 MED ORDER — PANTOPRAZOLE SODIUM 40 MG PO TBEC
40.0000 mg | DELAYED_RELEASE_TABLET | Freq: Every day | ORAL | Status: DC
Start: 2023-12-09 — End: 2023-12-11
  Administered 2023-12-10 – 2023-12-11 (×2): 40 mg via ORAL
  Filled 2023-12-09 (×2): qty 1

## 2023-12-09 MED ORDER — LACTATED RINGERS IV BOLUS
1000.0000 mL | Freq: Once | INTRAVENOUS | Status: AC
  Administered 2023-12-09: 1000 mL via INTRAVENOUS

## 2023-12-09 MED ORDER — HYDROMORPHONE HCL 1 MG/ML IJ SOLN
0.5000 mg | Freq: Once | INTRAMUSCULAR | Status: AC
  Administered 2023-12-09: 0.5 mg via INTRAVENOUS
  Filled 2023-12-09: qty 1

## 2023-12-09 MED ORDER — GABAPENTIN 100 MG PO CAPS
600.0000 mg | ORAL_CAPSULE | Freq: Two times a day (BID) | ORAL | Status: DC
  Administered 2023-12-09 – 2023-12-11 (×4): 600 mg via ORAL
  Filled 2023-12-09 (×6): qty 6

## 2023-12-09 MED ORDER — MECLIZINE HCL 12.5 MG PO TABS: 25.0000 mg | ORAL_TABLET | Freq: Three times a day (TID) | ORAL | Status: DC | PRN

## 2023-12-09 MED ORDER — DROPERIDOL 2.5 MG/ML IJ SOLN
1.2500 mg | Freq: Once | INTRAMUSCULAR | Status: AC
  Administered 2023-12-09: 1.25 mg via INTRAVENOUS
  Filled 2023-12-09: qty 2

## 2023-12-09 MED ORDER — DIPHENHYDRAMINE HCL 50 MG/ML IJ SOLN
25.0000 mg | Freq: Once | INTRAMUSCULAR | Status: AC
  Administered 2023-12-09: 25 mg via INTRAVENOUS
  Filled 2023-12-09: qty 1

## 2023-12-09 MED ORDER — PROMETHAZINE HCL 25 MG PO TABS: 25.0000 mg | ORAL_TABLET | Freq: Four times a day (QID) | ORAL | Status: DC | PRN

## 2023-12-09 MED ORDER — OXYCODONE HCL 5 MG PO TABS
15.0000 mg | ORAL_TABLET | Freq: Four times a day (QID) | ORAL | Status: DC | PRN
  Administered 2023-12-10 – 2023-12-11 (×2): 15 mg via ORAL
  Filled 2023-12-09 (×2): qty 3

## 2023-12-09 MED ORDER — DIPHENHYDRAMINE HCL 50 MG/ML IJ SOLN
25.0000 mg | Freq: Four times a day (QID) | INTRAMUSCULAR | Status: DC
  Administered 2023-12-09 – 2023-12-10 (×3): 25 mg via INTRAVENOUS
  Filled 2023-12-09 (×3): qty 1

## 2023-12-09 MED ORDER — KCL-LACTATED RINGERS-D5W 20 MEQ/L IV SOLN
INTRAVENOUS | Status: AC
  Filled 2023-12-09 (×2): qty 1000

## 2023-12-09 MED ORDER — FOLIC ACID 1 MG PO TABS
1.0000 mg | ORAL_TABLET | Freq: Every day | ORAL | Status: DC
  Administered 2023-12-10 – 2023-12-11 (×2): 1 mg via ORAL
  Filled 2023-12-09 (×2): qty 1

## 2023-12-09 NOTE — ED Provider Notes (Signed)
 [facility] EMERGENCY DEPARTMENT AT [facility] Provider Note   CSN: [id number] Arrival date & time: 12/09/23  1015     Patient presents with: Nausea, Emesis, and Abdominal Pain   Kristen Ramos is a 63 y.o. female.   63 yo F with a chief complaints of nausea and vomiting.  Patient has a history of cyclical vomiting syndrome though it has been sometime since she has required a visit to the hospital.  She has a going on about 48 hours.  Diffuse abdominal discomfort.  No fevers.  No known sick contacts.  Denies cough or congestion.     Emesis Associated symptoms: abdominal pain   Abdominal Pain Associated symptoms: vomiting        Prior to Admission medications   Medication Sig Start Date End Date Taking? Authorizing Provider  benzonatate  (TESSALON ) 100 MG capsule Take 1 capsule (100 mg total) by mouth 3 (three) times daily as needed for cough. 01/25/22   Rai, Nydia POUR, MD  Cholecalciferol (VITAMIN D-3 PO) Take 1 capsule by mouth daily. Patient not taking: Reported on 01/21/2022    [provider]  folic acid  (FOLVITE ) 1 MG tablet Take 1 mg by mouth daily. 04/01/19   [provider]  HORIZANT  600 MG TBCR Take 600 mg by mouth 2 (two) times daily. 10/30/21   [provider]  lacosamide  (VIMPAT ) 50 MG TABS tablet Take 1 tablet (50 mg total) by mouth 2 (two) times daily. 11/27/21   Jadine Toribio SQUIBB, MD  meclizine  (ANTIVERT ) 25 MG tablet Take 1 tablet (25 mg total) by mouth 3 (three) times daily as needed for dizziness. 10/20/21   Charlyn Sora, MD  metoCLOPramide  (REGLAN ) 5 MG tablet Take 1 tablet (5 mg total) by mouth 3 (three) times daily before meals. 01/25/22 03/26/22  Rai, Nydia POUR, MD  oxyCODONE  (ROXICODONE ) 15 MG immediate release tablet Take 1 tablet (15 mg total) by mouth 4 (four) times daily as needed for pain. Patient taking differently: Take 15 mg by mouth in the morning, at noon, in the evening, and at bedtime. 10/27/21   Samtani,  Jai-Gurmukh, MD  pantoprazole  (PROTONIX ) 40 MG tablet Take 1 tablet (40 mg total) by mouth daily. 01/25/22   Rai, Nydia POUR, MD  polyethylene glycol (MIRALAX  / GLYCOLAX ) 17 g packet Take 17 g by mouth daily. Patient taking differently: Take 17 g by mouth daily as needed for mild constipation. 06/23/19   Sherrill Cable Latif, DO  promethazine  (PHENERGAN ) 25 MG tablet Take 1 tablet (25 mg total) by mouth every 6 (six) hours as needed for refractory nausea / vomiting. 01/25/22   Rai, Nydia POUR, MD    Allergies: Bee venom    Review of Systems  Gastrointestinal:  Positive for abdominal pain and vomiting.    Updated Vital Signs BP (!) 167/75   Pulse 78   Temp 97.9 F (36.6 C) (Oral)   Resp 17   Ht 5' 6 (1.676 m)   Wt 77.1 kg   SpO2 100%   BMI 27.44 kg/m   Physical Exam Vitals and nursing note reviewed.  Constitutional:      General: She is not in acute distress.    Appearance: She is well-developed. She is not diaphoretic.  HENT:     Head: Normocephalic and atraumatic.  Eyes:     Pupils: Pupils are equal, round, and reactive to light.  Cardiovascular:     Rate and Rhythm: Normal rate and regular rhythm.     Heart  sounds: No murmur heard.    No friction rub. No gallop.  Pulmonary:     Effort: Pulmonary effort is normal.     Breath sounds: No wheezing or rales.  Abdominal:     General: There is no distension.     Palpations: Abdomen is soft.     Tenderness: There is abdominal tenderness.     Comments: Patient is actively moving all around on the stretcher.  No obvious focal abdominal discomfort.  Musculoskeletal:        General: No tenderness.     Cervical back: Normal range of motion and neck supple.  Skin:    General: Skin is warm and dry.  Neurological:     Mental Status: She is alert and oriented to person, place, and time.  Psychiatric:        Behavior: Behavior normal.     (all labs ordered are listed, but only abnormal results are displayed) Labs Reviewed   COMPREHENSIVE METABOLIC PANEL WITH GFR - Abnormal; Notable for the following components:      Result Value   CO2 15 (*)    Glucose, Bld 136 (*)    Total Protein 8.3 (*)    All other components within normal limits  URINALYSIS, ROUTINE W REFLEX MICROSCOPIC - Abnormal; Notable for the following components:   APPearance CLOUDY (*)    Ketones, ur 20 (*)    Protein, ur 30 (*)    Bacteria, UA FEW (*)    All other components within normal limits  CBC WITH DIFFERENTIAL/PLATELET  LIPASE, BLOOD  MAGNESIUM  ETHANOL  SALICYLATE LEVEL  I-STAT CG4 LACTIC ACID, ED    EKG: EKG Interpretation Date/Time:  Monday December 09 2023 10:26:57 EDT Ventricular Rate:  71 PR Interval:  131 QRS Duration:  94 QT Interval:  438 QTC Calculation: 476 R Axis:   47  Text Interpretation: Sinus rhythm Left atrial enlargement Baseline wander in lead(s) II aVR aVF Baseline wander TECHNICALLY DIFFICULT Otherwise no significant change Confirmed by Emil Share [id number]) on 12/09/2023 10:36:17 AM  Radiology: No results found.   .Critical Care  Performed by: Emil Share, DO Authorized by: Emil Share, DO   Critical care provider statement:    Critical care time (minutes):  35   Critical care time was exclusive of:  Separately billable procedures and treating other patients   Critical care was time spent personally by me on the following activities:  Development of treatment plan with patient or surrogate, discussions with consultants, evaluation of patient's response to treatment, examination of patient, ordering and review of laboratory studies, ordering and review of radiographic studies, ordering and performing treatments and interventions, pulse oximetry, re-evaluation of patient's condition and review of old charts   Care discussed with: admitting provider      Medications Ordered in the ED  lactated ringers  bolus 1,000 mL (0 mLs Intravenous Stopped 12/09/23 1218)  droperidol  (INAPSINE ) 2.5 MG/ML injection  1.25 mg (1.25 mg Intravenous Given 12/09/23 1039)  diphenhydrAMINE  (BENADRYL ) injection 25 mg (25 mg Intravenous Given 12/09/23 1039)  HYDROmorphone (DILAUDID) injection 0.5 mg (0.5 mg Intravenous Given 12/09/23 1039)  lactated ringers  bolus 1,000 mL (1,000 mLs Intravenous New Bag/Given 12/09/23 1218)  lactated ringers  bolus 1,000 mL (1,000 mLs Intravenous New Bag/Given 12/09/23 1527)  droperidol  (INAPSINE ) 2.5 MG/ML injection 1.25 mg (1.25 mg Intravenous Given 12/09/23 1527)  diphenhydrAMINE  (BENADRYL ) injection 25 mg (25 mg Intravenous Given 12/09/23 1527)  Medical Decision Making Amount and/or Complexity of Data Reviewed Labs: ordered.  Risk Prescription drug management.   63 yo F with a chief complaints of diffuse abdominal discomfort.  This has been going on for about 2 days now.  Has a history of cyclic vomiting syndrome and this feels the same to her.  Will treat symptoms.  Blood work.  IV fluids.  Reassess.  Patient with significant metabolic acidosis without anion gap.  Some ketones in the urine  Patient feeling better after treatment here though with persistent nausea despite multiple rounds of antiemetics.  Will discuss with medicine.   The patients results and plan were reviewed and discussed.   Any x-rays performed were independently reviewed by myself.   Differential diagnosis were considered with the presenting HPI.  Medications  lactated ringers  bolus 1,000 mL (0 mLs Intravenous Stopped 12/09/23 1218)  droperidol  (INAPSINE ) 2.5 MG/ML injection 1.25 mg (1.25 mg Intravenous Given 12/09/23 1039)  diphenhydrAMINE  (BENADRYL ) injection 25 mg (25 mg Intravenous Given 12/09/23 1039)  HYDROmorphone (DILAUDID) injection 0.5 mg (0.5 mg Intravenous Given 12/09/23 1039)  lactated ringers  bolus 1,000 mL (1,000 mLs Intravenous New Bag/Given 12/09/23 1218)  lactated ringers  bolus 1,000 mL (1,000 mLs Intravenous New Bag/Given 12/09/23 1527)  droperidol   (INAPSINE ) 2.5 MG/ML injection 1.25 mg (1.25 mg Intravenous Given 12/09/23 1527)  diphenhydrAMINE  (BENADRYL ) injection 25 mg (25 mg Intravenous Given 12/09/23 1527)    Vitals:   12/09/23 1029 12/09/23 1230 12/09/23 1300 12/09/23 1430  BP:  (!) 190/90 (!) 190/95 (!) 167/75  Pulse:  79 79 78  Resp:  12 19 17   Temp: 98.1 F (36.7 C)  98 F (36.7 C) 97.9 F (36.6 C)  TempSrc: Oral  Oral Oral  SpO2:  100% 100% 100%  Weight:      Height:        Final diagnoses:  Cyclic vomiting syndrome    Admission/ observation were discussed with the admitting physician, patient and/or family and they are comfortable with the plan.        Final diagnoses:  Cyclic vomiting syndrome    ED Discharge Orders     None          Emil Share, DO 12/09/23 1601

## 2023-12-09 NOTE — ED Notes (Signed)
 Unable to obtain labs

## 2023-12-09 NOTE — Hospital Course (Signed)
 Kristen Ramos is a 63 y.o. female with history of cyclic vomiting syndrome, GERD, RA, HLD, chronic back pain, vertigo, and hx of seizures who was admitted for intractable nausea/vomiting.  Cyclical Vomiting Syndrome Presented with nausea, vomiting, and abdominal pain in setting of longstanding history of cyclical vomiting syndrome likely due to flare. Started on IV Metoclopramide , Meclizine  and Phenergan .  Symptomatically improved and diet was advanced and she was stable with minimal symptoms at the time of discharge.  Seizure disorder Per pharmacy, patient's Vimpat  50 mg tablets have not been filled since 2023. She remained seizure-free during this admission. Recommended outpatient follow-up with PCP/Neurology.  Other chronic conditions were medically managed with home medications and formulary alternatives as necessary (rheumatoid arthritis, GERD).  Follow-up recommendations Follow-up for outpatient management of seizure disorder medications

## 2023-12-09 NOTE — Assessment & Plan Note (Addendum)
 Stable. In the setting of possible viral illness. - Admit to FMTS, attending Dr. Donzetta - Vital signs per floor - CLD, advance as tolerated - For N/V        - Scheduled IV Reglan  5 mg Q6H        - Scheduled IV Benadryl  25 mg Q6H to be taken with reglan  to reduce EPS        - Second line ( Refractory ) home Phenergan  25 mg Q6H PRN  - Fluids D5LR w/ 20 meq KCL until tolerated PO - Pending KUB - AM CBC/BMP  - AM EKG given reglan  use - Pending Salicylate, Ethanol level, and UDS - Trending Lactic acid - PT/OT to treat - VTE prophylaxis Lovenox  - Fall precautions - Delirium precautions

## 2023-12-09 NOTE — Assessment & Plan Note (Addendum)
 Rheumatoid arthritis : continue home Gabapentin  Enacarbil 600 mg (Horizant ) GERD  : Continue Protonix  40 mg PO  Seizure disorder : Continue lacosamide  (VIMPAT ) 50 MG TABS tablet BID

## 2023-12-09 NOTE — ED Notes (Signed)
 [facility] bringing pt from home Pt has been having vomiting since yesterday with abdominal pain (generalized and back pain (generalized) - Has a vomiting disorder and says this is similar to previous episodes   EMS interventions: 4mg  of Zofran  22G LAC 250 mL NS  EMS Vitals  BP: 123/87 CBG: 151 O2: 100 on RA HR: 70 NSR

## 2023-12-09 NOTE — ED Notes (Signed)
 Encouraged patient to urinate. Patient declined at this time. Will start fluids to assist.

## 2023-12-09 NOTE — Plan of Care (Signed)
 Called daughter, Karoline Breed, per her request to give an update.  Daughter wanted to know why her mother had presented to the emergency room.  Explained to daughter that patient is here with nausea and vomiting, currently being treated with IV Reglan  and Benadryl  with Phenergan  as needed.  Daughter plans to follow-up tomorrow (informed of FM TS rounding timeline), and asked to be notified if anything significant were to change in her mother's care.

## 2023-12-09 NOTE — ED Notes (Signed)
 Patient ambulated within her room to the bathroom for a total of 38ft~ with a steady gait with a stand by assist. The patient was still unable to produce a urine sample at this time.

## 2023-12-09 NOTE — ED Notes (Signed)
 Daughter Karoline Breed [phone] would like an update asap

## 2023-12-09 NOTE — Plan of Care (Signed)
 FMTS Brief Progress Note  S: Went to evaluate patient at bedside with Dr. Karin for night rounds.  Patient still nauseous, no other concerns.  O: BP (!) 166/86 (BP Location: Right Arm)   Pulse 83   Temp 99 F (37.2 C) (Oral)   Resp 17   Ht 5' 6 (1.676 m)   Wt 77.1 kg   SpO2 100%   BMI 27.44 kg/m    General: NAD, vomiting intermittently Cardio: RRR, no MRG. Respiratory: CTAB, normal wob on RA GI: Abdomen is soft, not distended.  Diffusely tender. Skin: Warm and dry  A/P: Cyclical vomiting syndrome -She received her Benadryl /metoclopramide  -Pending as needed promethazine , will see how she does with this -Continue plan as outlined in H&P from day team on 12/09/2023 - Orders reviewed. Labs for AM ordered, which was adjusted as needed.  - If condition changes, plan includes page family medicine teaching service.   Howell Lunger, DO 12/09/2023, 9:08 PM PGY-3, [facility] Night Resident  Please page [phone] with questions.

## 2023-12-09 NOTE — H&P (Cosign Needed Addendum)
 Hospital Admission History and Physical Service Pager: [phone]  Patient name: Kristen Ramos Medical record number: [id number] Date of Birth: [birth year] Age: 63 y.o. Gender: female  Primary Care Provider: Vaughn Lauraine NOVAK, PA-C  Consultants: None Code Status: Full Code which was confirmed with family if patient unable to confirm   Preferred Emergency Contact:  Cruz,Shawnette (Daughter) [phone] [facility])   [place], [place] ( Son ) [phone]  Chief Complaint: Nausea, Emesis, and Abdominal Pain   Differential and Medical Decision Making:  Kristen Ramos is a 63 y.o. female presenting with Nausea, Emesis, and Abdominal Pain.  Differential for this patient's presentation of this includes  Cyclical Vomiting Syndrome : most likely due to history of Cyclical Vomiting Syndrome. She was hospitalized at [facility] for this problem and they have not been able to find the etiology of her symptoms in 2023. There she was started on Reglan  on a scheduled basis.  Continue PPI twice a day.  Symptomatic treatment.  IV fluids.  Upper endoscopy in 2020 suggested gastritis.  She is followed by gastroenterology in [facility].  Based on care everywhere it looks like her symptoms were controlled with the meclizine .  SBO : less likely patient states last BM was yesterday 12/08/23 but liquidly she denies history of abdominal surgery. However we will order KUB to rule out possible SBO Cannibal induced hyperemesis : Patient refused current use of Cannibal. UDS pending  Gastroparesis : Less likely per note her Gastric emptying study from 2020  at [facility] not suggest gastroparesis.  Viral gastritis : Less likely patient denies diarrhea and state this feel similar to her previous Intractable nausea and vomiting episode Assessment & Plan Cyclical vomiting syndrome Stable. In the setting of possible viral illness. - Admit to FMTS, attending Dr. Donzetta - Vital signs per floor - CLD, advance as  tolerated - For N/V        - Scheduled IV Reglan  5 mg Q6H        - Scheduled IV Benadryl  25 mg Q6H to be taken with reglan  to reduce EPS        - Second line ( Refractory ) home Phenergan  25 mg Q6H PRN  - Fluids D5LR w/ 20 meq KCL until tolerated PO - Pending KUB - AM CBC/BMP  - AM EKG given reglan  use - Pending Salicylate, Ethanol level, and UDS - Trending Lactic acid - PT/OT to treat - VTE prophylaxis Lovenox  - Fall precautions - Delirium precautions  Chronic health problem Rheumatoid arthritis : continue home Gabapentin  Enacarbil 600 mg (Horizant ) GERD  : Continue Protonix  40 mg PO  Seizure disorder : Continue lacosamide  (VIMPAT ) 50 MG TABS tablet BID  FEN/GI: CLD, advance as tolerated VTE Prophylaxis: Lovenox    Disposition: Med-surg unit  History of Present Illness:  Kristen Ramos is a 63 y.o. female presenting nausea and vomiting   Patient with history of cyclic vomiting syndrome, GERD, RA, HLD, chronic back pain, vertigo, and hx of seizures. Per patient she has having having continuous N/V episode in the last 2 days. She also c/o abdominal pain but denies fever, SOB, chest pain, palpitation, or diarrhea. She denies sick contacts.  She states this feels like her typical episodes of cyclical vomiting syndrome. It started out of the blue. No history of abdominal surgery. She has been having liquid stools, most recently yesterday. She states that reglan  helps the most with her symptoms in the past. No palpitations endorsed today. She does have some abdominal pain. No  PO in the last 2 days.  She has not had a flare of her cyclic vomiting for a year. No new foods recently. No new medications.  In the ED, patient was hemodynamically stable Benadryl  25 mg IV x 2 , Droperidol  1.25 mg IV x2 with 3L of LR was given. WBC WNL. Lactic acid 2  Pertinent Past Medical History: Rheumatoid arthritis   GERD   Accelerated hypertension  Seizure disorder :  Cyclic vomiting syndrome Remainder  reviewed in history tab.   Pertinent Past Surgical History: - Abdominal Hysterectomy - Back surgery Remainder reviewed in history tab.   Pertinent Social History: Tobacco use: Yes, has not smoked in 2 days, usually smokes 5 cigarettes a day Alcohol use: None Other Substance use: no marijuana use Lives with granddaughter  Pertinent Family History: Not contributing   Important Outpatient Medications: No current facility-administered medications on file prior to encounter. benzonatate  (TESSALON ) 100 MG capsule TID PRN Cholecalciferol (VITAMIN D-3 PO)Daily  folic acid  (FOLVITE ) 1 MG tablet Daily  HORIZANT  600 MG TBCR BID  lacosamide  (VIMPAT ) 50 MG TABS tablet BID meclizine  (ANTIVERT ) 25 MG tablet TID PRN for dizziness.  metoCLOPramide  (REGLAN ) 5 MG tablet TIDAC oxyCODONE  (ROXICODONE ) 15 MG immediate release tablet Q4H PRN pantoprazole  (PROTONIX ) 40 MG tablet Daily polyethylene glycol (MIRALAX  / GLYCOLAX ) 17 g packet Daily  promethazine  (PHENERGAN ) 25 MG tablet Take 1 tablet (25 mg total)  Q6H PRNfor refractory N/V   Objective: BP (!) 167/75   Pulse 78   Temp 97.9 F (36.6 C) (Oral)   Resp 17   Ht 5' 6 (1.676 m)   Wt 77.1 kg   SpO2 100%   BMI 27.44 kg/m  Exam: Physical Exam Constitutional:      General: She is not in acute distress.    Appearance: She is well-developed.  HENT:     Head: Normocephalic and atraumatic.  Eyes:     Extraocular Movements: Extraocular movements intact.  Cardiovascular:     Rate and Rhythm: Normal rate.     Heart sounds: Normal heart sounds.  Pulmonary:     Effort: Pulmonary effort is normal.     Breath sounds: Normal breath sounds.  Abdominal:     General: Abdomen is flat. Bowel sounds are normal. There is no distension.     Palpations: Abdomen is soft.     Tenderness: There is generalized abdominal tenderness.  Skin:    General: Skin is warm and dry.     Capillary Refill: Capillary refill takes less than 2 seconds.  Neurological:      General: No focal deficit present.     Mental Status: She is alert and oriented to person, place, and time.     Labs:  CBC BMET  Recent Labs  Lab 12/09/23 1038  WBC 8.6  HGB 13.6  HCT 40.4  PLT 262   Recent Labs  Lab 12/09/23 1038  NA 136  K 3.5  CL 107  CO2 15*  BUN 8  CREATININE 0.74  GLUCOSE 136*  CALCIUM  9.5     Urinalysis    Component Value Date/Time   COLORURINE YELLOW 12/09/2023 1446   APPEARANCEUR CLOUDY (A) 12/09/2023 1446   LABSPEC 1.029 12/09/2023 1446   PHURINE 5.0 12/09/2023 1446   GLUCOSEU NEGATIVE 12/09/2023 1446   HGBUR NEGATIVE 12/09/2023 1446   BILIRUBINUR NEGATIVE 12/09/2023 1446   KETONESUR 20 (A) 12/09/2023 1446   PROTEINUR 30 (A) 12/09/2023 1446   NITRITE NEGATIVE 12/09/2023 1446   LEUKOCYTESUR NEGATIVE 12/09/2023 1446  Lipase and LFTs normal  Imaging Studies Performed: None  Kristen Houston NOVAK, DO 12/09/2023, 3:48 PM PGY-1, [facility]  FPTS Intern pager: [phone], text pages welcome Secure chat group [facility] Teaching Service    I agree with the assessment and plan as documented above.  Stuart Redo, MD PGY-3, Outpatient [facility]

## 2023-12-09 NOTE — ED Notes (Signed)
 Pt urinated . Sample sent.

## 2023-12-09 NOTE — ED Notes (Signed)
 Notified EDP Emil, DO, regarding patient blood pressure.

## 2023-12-10 DIAGNOSIS — G40909 Epilepsy, unspecified, not intractable, without status epilepticus: Secondary | ICD-10-CM | POA: Diagnosis present

## 2023-12-10 DIAGNOSIS — F17210 Nicotine dependence, cigarettes, uncomplicated: Secondary | ICD-10-CM | POA: Diagnosis present

## 2023-12-10 DIAGNOSIS — R112 Nausea with vomiting, unspecified: Secondary | ICD-10-CM | POA: Diagnosis present

## 2023-12-10 DIAGNOSIS — M549 Dorsalgia, unspecified: Secondary | ICD-10-CM | POA: Diagnosis present

## 2023-12-10 DIAGNOSIS — E876 Hypokalemia: Secondary | ICD-10-CM | POA: Diagnosis present

## 2023-12-10 DIAGNOSIS — E785 Hyperlipidemia, unspecified: Secondary | ICD-10-CM | POA: Diagnosis present

## 2023-12-10 DIAGNOSIS — Z79899 Other long term (current) drug therapy: Secondary | ICD-10-CM | POA: Diagnosis not present

## 2023-12-10 DIAGNOSIS — G8929 Other chronic pain: Secondary | ICD-10-CM | POA: Diagnosis present

## 2023-12-10 DIAGNOSIS — R1115 Cyclical vomiting syndrome unrelated to migraine: Principal | ICD-10-CM | POA: Diagnosis present

## 2023-12-10 DIAGNOSIS — M069 Rheumatoid arthritis, unspecified: Secondary | ICD-10-CM | POA: Diagnosis present

## 2023-12-10 DIAGNOSIS — I10 Essential (primary) hypertension: Secondary | ICD-10-CM | POA: Diagnosis present

## 2023-12-10 DIAGNOSIS — Z91030 Bee allergy status: Secondary | ICD-10-CM | POA: Diagnosis not present

## 2023-12-10 DIAGNOSIS — Z90710 Acquired absence of both cervix and uterus: Secondary | ICD-10-CM | POA: Diagnosis not present

## 2023-12-10 DIAGNOSIS — K219 Gastro-esophageal reflux disease without esophagitis: Secondary | ICD-10-CM | POA: Diagnosis present

## 2023-12-10 DIAGNOSIS — E872 Acidosis, unspecified: Secondary | ICD-10-CM | POA: Diagnosis present

## 2023-12-10 LAB — CBC
HCT: 39.9 % (ref 36.0–46.0)
Hemoglobin: 14.1 g/dL (ref 12.0–15.0)
MCH: 31.1 pg (ref 26.0–34.0)
MCHC: 35.3 g/dL (ref 30.0–36.0)
MCV: 87.9 fL (ref 80.0–100.0)
Platelets: 279 K/uL (ref 150–400)
RBC: 4.54 MIL/uL (ref 3.87–5.11)
RDW: 14.5 % (ref 11.5–15.5)
WBC: 8.2 K/uL (ref 4.0–10.5)
nRBC: 0 % (ref 0.0–0.2)

## 2023-12-10 LAB — BASIC METABOLIC PANEL WITH GFR
Anion gap: 12 (ref 5–15)
Anion gap: 13 (ref 5–15)
BUN: <5 mg/dL — ABNORMAL LOW (ref 8–23)
BUN: <5 mg/dL — ABNORMAL LOW (ref 8–23)
CO2: 22 mmol/L (ref 22–32)
CO2: 21 mmol/L — ABNORMAL LOW (ref 22–32)
Calcium: 9.1 mg/dL (ref 8.9–10.3)
Calcium: 9 mg/dL (ref 8.9–10.3)
Chloride: 96 mmol/L — ABNORMAL LOW (ref 98–111)
Chloride: 96 mmol/L — ABNORMAL LOW (ref 98–111)
Creatinine, Ser: 0.7 mg/dL (ref 0.44–1.00)
Creatinine, Ser: 0.69 mg/dL (ref 0.44–1.00)
GFR, Estimated: >60 mL/min (ref >60)
GFR, Estimated: >60 mL/min (ref >60)
Glucose, Bld: 130 mg/dL — ABNORMAL HIGH (ref 70–99)
Glucose, Bld: 124 mg/dL — ABNORMAL HIGH (ref 70–99)
Potassium: 2.9 mmol/L — ABNORMAL LOW (ref 3.5–5.1)
Potassium: 4.2 mmol/L (ref 3.5–5.1)
Sodium: 130 mmol/L — ABNORMAL LOW (ref 135–145)
Sodium: 130 mmol/L — ABNORMAL LOW (ref 135–145)

## 2023-12-10 LAB — HIV ANTIBODY (ROUTINE TESTING W REFLEX): HIV Screen 4th Generation wRfx: NONREACTIVE

## 2023-12-10 MED ORDER — POTASSIUM CHLORIDE 10 MEQ/100ML IV SOLN
10.0000 meq | INTRAVENOUS | Status: AC
  Administered 2023-12-10 (×2): 10 meq via INTRAVENOUS
  Filled 2023-12-10 (×2): qty 100

## 2023-12-10 MED ORDER — HYDROMORPHONE HCL 1 MG/ML IJ SOLN
0.5000 mg | Freq: Once | INTRAMUSCULAR | Status: AC
  Administered 2023-12-10: 0.5 mg via INTRAVENOUS
  Filled 2023-12-10: qty 1

## 2023-12-10 MED ORDER — METOCLOPRAMIDE HCL 5 MG/ML IJ SOLN
10.0000 mg | Freq: Four times a day (QID) | INTRAMUSCULAR | Status: DC
  Administered 2023-12-10 – 2023-12-11 (×4): 10 mg via INTRAVENOUS
  Filled 2023-12-10 (×4): qty 2

## 2023-12-10 MED ORDER — POTASSIUM CHLORIDE 10 MEQ/100ML IV SOLN
10.0000 meq | INTRAVENOUS | Status: AC
  Administered 2023-12-10 (×4): 10 meq via INTRAVENOUS
  Filled 2023-12-10 (×4): qty 100

## 2023-12-10 MED ORDER — ONDANSETRON HCL 4 MG/2ML IJ SOLN
4.0000 mg | Freq: Once | INTRAMUSCULAR | Status: AC
  Administered 2023-12-10: 4 mg via INTRAVENOUS
  Filled 2023-12-10: qty 2

## 2023-12-10 MED ORDER — MECLIZINE HCL 12.5 MG PO TABS
25.0000 mg | ORAL_TABLET | Freq: Four times a day (QID) | ORAL | Status: DC
  Administered 2023-12-10 – 2023-12-11 (×4): 25 mg via ORAL
  Filled 2023-12-10 (×4): qty 2

## 2023-12-10 NOTE — Progress Notes (Signed)
     Daily Progress Note Intern Pager: [phone]  Patient name: Kristen Ramos Medical record number: [id number] Date of birth: [birth year] Age: 63 y.o. Gender: female  Primary Care Provider: Vaughn Lauraine NOVAK, PA-C Consultants: None Code Status: FULL  Pt Overview and Major Events to Date:  9/29 - Admitted  Patient is a 63 year old female presenting with nausea, vomiting, and abdominal pain most likely a recurrence of her historical diagnosis of cyclic vomiting syndrome. Pertinent PMH/PSH includes cyclic vomiting syndrome, GERD, RA, HLD, chronic back pain, vertigo, and hx of seizures. Assessment & Plan Cyclical vomiting syndrome Patient continues to endorse N/V, inability to tolerate much oral intake. - For N/V:        - Scheduled IV Reglan  5 mg Q6H        - Second line (Refractory) home Phenergan  25 mg Q6H PRN         - Scheduled p.o. Meclizine  25 mg q6h         - Discontinued scheduled benadryl  - Fluids D5LR w/ 20 meq KCL until tolerated PO - AM BMP  - PT/OT to treat - Fall and delirium precautions Hypokalemia Likely secondary to emesis. K 2.9 today. Repleted this A.M. - Fluids and supplementation as above Chronic health problem Rheumatoid arthritis: continue home Gabapentin  Enacarbil 600 mg (Horizant ) GERD: Continue Protonix  40 mg PO  Seizure disorder: pharm said locosamide not being prescribed and dc'd  FEN/GI: Clear liquid diet, advance as tolerated PPx: Lovenox  Dispo:Home pending clinical improvement . Barriers include continued nausea/vomiting.   Subjective:  Patient seen at bedside. Reports she does not feel well. Continues to feel nauseous and having generalized abdominal pain. Vomited this morning around 5:30. Denies hematemesis, diarrhea, constipation. Denies fever, cough, sore throat. Reports Reglan  has been helpful in the past, but feels it has not been working so far this admission. Says she did not have much success in the past with Zofran  or other  anti-emetics.  Objective: Temp:  [97.9 F (36.6 C)-99 F (37.2 C)] 98 F (36.7 C) (09/30 0538) Pulse Rate:  [68-99] 99 (09/30 0538) Resp:  [12-22] 17 (09/29 1430) BP: (161-190)/(75-95) 182/95 (09/30 0538) SpO2:  [98 %-100 %] 98 % (09/30 0538) Weight:  [77.1 kg] 77.1 kg (09/29 1023)  Physical Exam: General: lying in bed, uncomfortable-appearing Cardiovascular: regular rate and rhythm; normal S1/S2 Respiratory: clear to auscultation bilaterally; normal WOB on RA Abdomen: generalized tenderness to palpation; soft, non-distended; normal bowel sounds Extremities: warm and well-perfused   Laboratory: Most recent CBC Lab Results  Component Value Date   WBC 8.2 12/10/2023   HGB 14.1 12/10/2023   HCT 39.9 12/10/2023   MCV 87.9 12/10/2023   PLT 279 12/10/2023   Most recent BMP    Latest Ref Rng & Units 12/10/2023    5:00 AM  BMP  Glucose 70 - 99 mg/dL 869   BUN 8 - 23 mg/dL <5   Creatinine 9.55 - 1.00 mg/dL 9.29   Sodium 864 - 854 mmol/L 130   Potassium 3.5 - 5.1 mmol/L 2.9   Chloride 98 - 111 mmol/L 96   CO2 22 - 32 mmol/L 22   Calcium  8.9 - 10.3 mg/dL 9.1     Imaging/Diagnostic Tests: No new imaging  EKG: NSR; QTc 491  Mannie Ashley SAILOR, MD 12/10/2023, 7:08 AM  PGY-1, [facility] Family [facility] FPTS Intern pager: [phone], text pages welcome Secure chat group [facility] Teaching Service

## 2023-12-10 NOTE — Plan of Care (Signed)

## 2023-12-10 NOTE — Evaluation (Signed)
 Physical Therapy Evaluation Patient Details Name: Kristen Ramos MRN: [id number] DOB: 05-10-1960 Today's Date: 12/10/2023  History of Present Illness  Pt is a 63 y/o female who presented to ED 12/09/23 with upper abdominal pain, nausea and vomiting related to her cyclical vomiting syndrome. PMH: chronic pain, RA, seizure, vertigo, back pain  Clinical Impression  Pt admitted secondary to problem above with deficits below. PTA patient lives with grandaughter and is independent to modified independent with occasional use of cane due to pain. She lives in two-story home with her bedroom and shower upstairs. Pt currently with limited ambulation distance due to nausea with dry heaves. Required CGA holding IV pole and agreed she would be safer with use of RW at this time. (RN made aware).  Anticipate patient will benefit from PT to address problems listed below. Will continue to follow acutely to maximize functional mobility, independence, and safety.  Anticipate no followup PT or DME needs.          If plan is discharge home, recommend the following: Help with stairs or ramp for entrance   Can travel by private vehicle        Equipment Recommendations None recommended by PT  Recommendations for Other Services       Functional Status Assessment Patient has had a recent decline in their functional status and demonstrates the ability to make significant improvements in function in a reasonable and predictable amount of time.     Precautions / Restrictions Precautions Precautions: Fall Recall of Precautions/Restrictions: Intact      Mobility  Bed Mobility Overal bed mobility: Independent             General bed mobility comments: up/down from sidelying x 3 (returning to side due to nausea, dry heaves)    Transfers Overall transfer level: Modified independent Equipment used: None (but quickly reaches for IV pole once up)                    Ambulation/Gait Ambulation/Gait  assistance: Contact guard assist Gait Distance (Feet): 12 Feet (toileted,  12) Assistive device: IV Pole Gait Pattern/deviations: Step-through pattern, Decreased stride length, Trunk flexed   Gait velocity interpretation: 1.31 - 2.62 ft/sec, indicative of limited community ambulator   General Gait Details: pt reaching for UE support with free hand (other hand on IV pole); agreed she would be safer using RW with bil UE support and agreed to call for assist with IV pole  Stairs            Wheelchair Mobility     Tilt Bed    Modified Rankin (Stroke Patients Only)       Balance Overall balance assessment: Mild deficits observed, not formally tested                                           Pertinent Vitals/Pain Pain Assessment Pain Assessment: Faces Faces Pain Scale: Hurts a little bit Pain Location: back Pain Descriptors / Indicators: Aching Pain Intervention(s): Limited activity within patient's tolerance, Monitored during session, Repositioned    Home Living Family/patient expects to be discharged to:: Private residence Living Arrangements: Other relatives Available Help at Discharge: Family;Available PRN/intermittently Type of Home: House Home Access: Level entry     Alternate Level Stairs-Number of Steps: 14 Home Layout: Two level;Bed/bath upstairs Home Equipment: Rollator (4 wheels);Cane - single point  Prior Function Prior Level of Function : Independent/Modified Independent             Mobility Comments: occasional use of cane (vs rollator) depending on her pain       Extremity/Trunk Assessment   Upper Extremity Assessment Upper Extremity Assessment: Overall WFL for tasks assessed (arthritic changes noted)    Lower Extremity Assessment Lower Extremity Assessment: Overall WFL for tasks assessed    Cervical / Trunk Assessment Cervical / Trunk Assessment: Kyphotic  Communication   Communication Communication: No  apparent difficulties    Cognition Arousal: Alert Behavior During Therapy: Flat affect   PT - Cognitive impairments: No apparent impairments                         Following commands: Intact       Cueing Cueing Techniques: Verbal cues     General Comments      Exercises     Assessment/Plan    PT Assessment Patient needs continued PT services  PT Problem List Decreased activity tolerance;Decreased balance;Decreased mobility       PT Treatment Interventions Gait training;Stair training;Functional mobility training;Therapeutic activities;Patient/family education    PT Goals (Current goals can be found in the Care Plan section)  Acute Rehab PT Goals Patient Stated Goal: be able to get upstairs to her bedroom/shower PT Goal Formulation: With patient Time For Goal Achievement: 12/23/23 Potential to Achieve Goals: Good    Frequency Min 2X/week     Co-evaluation               AM-PAC PT 6 Clicks Mobility  Outcome Measure Help needed turning from your back to your side while in a flat bed without using bedrails?: None Help needed moving from lying on your back to sitting on the side of a flat bed without using bedrails?: None Help needed moving to and from a bed to a chair (including a wheelchair)?: A Little Help needed standing up from a chair using your arms (e.g., wheelchair or bedside chair)?: None Help needed to walk in hospital room?: A Little Help needed climbing 3-5 steps with a railing? : A Little 6 Click Score: 21    End of Session   Activity Tolerance: Treatment limited secondary to medical complications (Comment) (nausea, dry heaves) Patient left: in bed;with call bell/phone within reach Nurse Communication: Mobility status PT Visit Diagnosis: Unsteadiness on feet (R26.81)    Time: 1051-1105 PT Time Calculation (min) (ACUTE ONLY): 14 min   Charges:   PT Evaluation $PT Eval Low Complexity: 1 Low   PT General Charges $$ ACUTE PT  VISIT: 1 Visit          Macario RAMAN, PT Acute Rehabilitation Services  Office [phone]   Macario SHAUNNA Soja 12/10/2023, 11:23 AM

## 2023-12-10 NOTE — Progress Notes (Signed)
 Transition of Care [facility]) - Inpatient Brief Assessment   Patient Details  Name: Kristen Ramos MRN: [id number] Date of Birth: August 18, 1960  [facility] CM/SW Contact:    Rosaline JONELLE Joe, RN Phone Number: 12/10/2023, 4:31 PM   Clinical Narrative: Patient admitted from home with abdominal pain, nausea and vomiting.  No IP Care management needs at this time.   Transition of Care Asessment: Insurance and Status: (P) Insurance coverage has been reviewed Patient has primary care physician: (P) Yes Home environment has been reviewed: (P) from home with family Prior level of function:: (P) self Prior/Current Home Services: (P) No current home services Social Drivers of Health Review: (P) SDOH reviewed no interventions necessary Readmission risk has been reviewed: (P) Yes Transition of care needs: (P) no transition of care needs at this time

## 2023-12-10 NOTE — Assessment & Plan Note (Addendum)
 Patient continues to endorse N/V, inability to tolerate much oral intake. - For N/V:        - Scheduled IV Reglan  5 mg Q6H        - Second line (Refractory) home Phenergan  25 mg Q6H PRN         - Scheduled p.o. Meclizine  25 mg q6h         - Discontinued scheduled benadryl  - Fluids D5LR w/ 20 meq KCL until tolerated PO - AM BMP  - PT/OT to treat - Fall and delirium precautions

## 2023-12-10 NOTE — Care Management Obs Status (Signed)
 MEDICARE OBSERVATION STATUS NOTIFICATION   Patient Details  Name: Kristen Ramos MRN: [id number] Date of Birth: 28-Mar-1960   Medicare Observation Status Notification Given:  Yes Verbally reviewed observation notice with [name] telephonically at [phone]. Will delivery a copy to the patient room      [name] 12/10/2023, 3:13 PM

## 2023-12-10 NOTE — Assessment & Plan Note (Signed)
 Rheumatoid arthritis: continue home Gabapentin  Enacarbil 600 mg (Horizant ) GERD: Continue Protonix  40 mg PO  Seizure disorder: pharm said locosamide not being prescribed and dc'd

## 2023-12-10 NOTE — Assessment & Plan Note (Addendum)
 Likely secondary to emesis. K 2.9 today. Repleted this A.M. - Fluids and supplementation as above

## 2023-12-11 ENCOUNTER — Other Ambulatory Visit (HOSPITAL_COMMUNITY): Payer: Self-pay

## 2023-12-11 DIAGNOSIS — R1115 Cyclical vomiting syndrome unrelated to migraine: Secondary | ICD-10-CM

## 2023-12-11 LAB — BASIC METABOLIC PANEL WITH GFR
Anion gap: 10 (ref 5–15)
BUN: 5 mg/dL — ABNORMAL LOW (ref 8–23)
CO2: 22 mmol/L (ref 22–32)
Calcium: 9.1 mg/dL (ref 8.9–10.3)
Chloride: 104 mmol/L (ref 98–111)
Creatinine, Ser: 0.65 mg/dL (ref 0.44–1.00)
GFR, Estimated: >60 mL/min (ref >60)
Glucose, Bld: 99 mg/dL (ref 70–99)
Potassium: 3.5 mmol/L (ref 3.5–5.1)
Sodium: 136 mmol/L (ref 135–145)

## 2023-12-11 MED ORDER — FOLIC ACID 1 MG PO TABS
1.0000 mg | ORAL_TABLET | Freq: Every day | ORAL | 0 refills | Status: AC
  Filled 2023-12-11: qty 30, 30d supply, fill #0

## 2023-12-11 MED ORDER — PROMETHAZINE HCL 12.5 MG PO TABS
12.5000 mg | ORAL_TABLET | Freq: Four times a day (QID) | ORAL | 0 refills | Status: AC | PRN
  Filled 2023-12-11: qty 90, 23d supply, fill #0

## 2023-12-11 MED ORDER — METOCLOPRAMIDE HCL 5 MG PO TABS
5.0000 mg | ORAL_TABLET | Freq: Three times a day (TID) | ORAL | 0 refills | Status: AC
  Filled 2023-12-11: qty 90, 30d supply, fill #0

## 2023-12-11 MED ORDER — MECLIZINE HCL 25 MG PO TABS
25.0000 mg | ORAL_TABLET | Freq: Three times a day (TID) | ORAL | 0 refills | Status: AC | PRN
  Filled 2023-12-11: qty 30, 10d supply, fill #0

## 2023-12-11 NOTE — Discharge Summary (Signed)
 Family Medicine Teaching [facility] Discharge Summary  Patient name: Kristen Ramos Medical record number: [id number] Date of birth: 1960/08/08 Age: 63 y.o. Gender: female Date of Admission: 12/09/2023  Date of Discharge: 12/11/2023 Admitting Physician: [place] KATHEE Samuels, DO  Primary Care Provider: Vaughn Lauraine KATHEE, PA-C Consultants: None  Indication for Hospitalization: Intractable N/V  Discharge Diagnoses/Problem List:  Principal Problem for Admission: Intractable N/V Other Problems addressed during stay:  Principal Problem:   Cyclical vomiting syndrome Active Problems:   Chronic health problem  Brief Hospital Course:  Kristen Ramos is a 63 y.o. female with history of cyclic vomiting syndrome, GERD, RA, HLD, chronic back pain, vertigo, and hx of seizures who was admitted for intractable nausea/vomiting.  Cyclical Vomiting Syndrome Presented with nausea, vomiting, and abdominal pain in setting of longstanding history of cyclical vomiting syndrome likely due to flare. Started on IV Metoclopramide , Meclizine  and Phenergan .  Symptomatically improved and diet was advanced and she was stable with minimal symptoms at the time of discharge.  Seizure disorder Per pharmacy, patient's Vimpat  50 mg tablets have not been filled since 2023. She remained seizure-free during this admission. Recommended outpatient follow-up with PCP/Neurology.  Other chronic conditions were medically managed with home medications and formulary alternatives as necessary (rheumatoid arthritis, GERD).  Follow-up recommendations Follow-up for outpatient management of seizure disorder medications  Results/Tests Pending at Time of Discharge:  Unresulted Labs (From admission, onward)     Start     Ordered   12/09/23 1702  Rapid urine drug screen (hospital performed)  Add-on,   AD        12/09/23 1703           Disposition: Home  Discharge Condition: Stable  Discharge Exam:  Vitals:   12/11/23 0612  12/11/23 0843  BP: 133/85 126/72  Pulse: 79 70  Resp:    Temp:  (!) 97.4 F (36.3 C)  SpO2: 99% 97%   General: Sitting up in bed, NAD CV: RRR without murmur RESP: Normal work of breathing on room air. CTAB Abdomen: Soft, nondistended.  Mild tenderness to palpation over epigastric region.  No rebound tenderness or guarding. EXT: No peripheral edema  Significant Procedures: None  Significant Labs and Imaging:  Recent Labs  Lab 12/10/23 0500  WBC 8.2  HGB 14.1  HCT 39.9  PLT 279   Recent Labs  Lab 12/10/23 0500 12/10/23 1110 12/11/23 0122  NA 130* 130* 136  K 2.9* 4.2 3.5  CL 96* 96* 104  CO2 22 21* 22  GLUCOSE 130* 124* 99  BUN <5* <5* 5*  CREATININE 0.70 0.69 0.65  CALCIUM  9.1 9.0 9.1   Pertinent Imaging: DG Abd 1 View Result Date: 12/09/2023 IMPRESSION: Nonobstructive bowel gas pattern.     Discharge Medications:  Allergies as of 12/11/2023       Reactions   Bee Venom Hives, Swelling        Medication List     STOP taking these medications    Horizant  600 MG Tbcr Generic drug: Gabapentin  Enacarbil   lacosamide  50 MG Tabs tablet Commonly known as: VIMPAT        TAKE these medications    albuterol  108 (90 Base) MCG/ACT inhaler Commonly known as: VENTOLIN  HFA Inhale 2 puffs into the lungs every 6 (six) hours as needed for shortness of breath.   Aspirin Low Dose 81 MG tablet Generic drug: aspirin EC Take 81 mg by mouth 2 (two) times daily.   cetirizine  10 MG tablet Commonly known as: ZYRTEC   Take 10 mg by mouth daily.   cyanocobalamin 1000 MCG/ML injection Commonly known as: VITAMIN B12 Inject 1,000 mcg into the muscle every 30 (thirty) days.   ferrous gluconate 324 MG tablet Commonly known as: FERGON Take 324 mg by mouth daily.   folic acid  1 MG tablet Commonly known as: FOLVITE  Take 1 tablet (1 mg total) by mouth daily.   gabapentin  600 MG tablet Commonly known as: NEURONTIN  Take 600 mg by mouth 2 (two) times daily.    meclizine  25 MG tablet Commonly known as: ANTIVERT  Take 1 tablet (25 mg total) by mouth 3 (three) times daily as needed for dizziness.   metoCLOPramide  5 MG tablet Commonly known as: Reglan  Take 1 tablet (5 mg total) by mouth 3 (three) times daily before meals.   oxyCODONE  15 MG immediate release tablet Commonly known as: ROXICODONE  Take 1 tablet (15 mg total) by mouth 4 (four) times daily as needed for pain. What changed: when to take this   pantoprazole  40 MG tablet Commonly known as: PROTONIX  Take 1 tablet (40 mg total) by mouth daily.   polyethylene glycol 17 g packet Commonly known as: MIRALAX  / GLYCOLAX  Take 17 g by mouth daily. What changed:  when to take this reasons to take this   promethazine  12.5 MG tablet Commonly known as: PHENERGAN  Take 1 tablet (12.5 mg total) by mouth every 6 (six) hours as needed for nausea or vomiting.        Discharge Instructions: Please refer to Patient Instructions section of EMR for full details.  Patient was counseled important signs and symptoms that should prompt return to medical care, changes in medications, dietary instructions, activity restrictions, and follow up appointments.   Follow-Up Appointments: See PCP within 1 week and GI as scheduled   Theophilus Pagan, MD 12/11/2023, 11:34 AM PGY-3, [facility] Family Medicine

## 2023-12-11 NOTE — Assessment & Plan Note (Signed)
 Likely secondary to emesis. K 3.5 today. . - continue to monitor

## 2023-12-11 NOTE — Assessment & Plan Note (Signed)
 Rheumatoid arthritis: continue home Gabapentin  Enacarbil 600 mg (Horizant ) GERD: Continue Protonix  40 mg PO  Seizure disorder: pharm said locosamide not being prescribed and dc'd

## 2023-12-11 NOTE — Progress Notes (Signed)
 Mobility Specialist: Progress Note   12/11/23 1000  Mobility  Activity Ambulated with assistance  Level of Assistance Standby assist, set-up cues, supervision of patient - no hands on  Assistive Device Front wheel walker  Distance Ambulated (ft) 400 ft  Activity Response Tolerated well  Mobility Referral Yes  Mobility visit 1 Mobility  Mobility Specialist Start Time (ACUTE ONLY) 0945  Mobility Specialist Stop Time (ACUTE ONLY) 0956  Mobility Specialist Time Calculation (min) (ACUTE ONLY) 11 min    Pt received in bed, agreeable to mobility session. SV throughout. C/o low back pain. Returned to room without fault. Left in bed with all needs met, call bell in reach.  Ileana Lute Mobility Specialist Please contact via SecureChat or Rehab office at [phone]

## 2023-12-11 NOTE — Discharge Instructions (Addendum)
 Dear [name],   Thank you for letting us  participate in your care! In this section, you will find a brief hospital admission summary of why you were admitted to the hospital, what happened during your admission, your diagnosis/diagnoses, and recommended follow up.   You were admitted for a flare of your cyclic vomiting syndrome symptoms.  You were managed with IV medications to help with your nausea and vomiting with improvement at the time of discharge.  Please continue to take your nausea medications as you usually do at home and follow-up with your PCP and GI doctor to discuss adjustments in the future.   POST-HOSPITAL & CARE INSTRUCTIONS We recommend following up with your PCP within 1 week from being discharged from the hospital. Please let PCP/Specialists know of any changes in medications that were made which you will be able to see in the medications section of this packet. Please also follow up with your GI doctor as scheduled  DOCTOR'S APPOINTMENTS & FOLLOW UP No future appointments.   Thank you for choosing [facility]! Take care and be well!  Family Medicine Teaching Service Inpatient Team [place]  [facility]  [address] [place], [place] [address] [phone]

## 2023-12-11 NOTE — Progress Notes (Signed)
 Daily Progress Note Intern Pager: [phone]  Patient name: Kristen Ramos Medical record number: [id number] Date of birth: 02-09-1961 Age: 63 y.o. Gender: female  Primary Care Provider: Vaughn Lauraine NOVAK, PA-C Consultants: None Code Status: FULL  Pt Overview and Major Events to Date:  9/29 - Admitted  Assessment and Plan:  Patient is a 63 year old female presenting with nausea, vomiting, and abdominal pain most likely a recurrence of her historical diagnosis of cyclic vomiting syndrome. Pertinent PMH/PSH includes cyclic vomiting syndrome, GERD, RA, HLD, chronic back pain, vertigo, and hx of seizures.  Assessment & Plan Cyclical vomiting syndrome Patient denies N/V, and is able to tolerate oral intake. - For N/V:        - Scheduled IV Reglan  5 mg Q6H        - Second line (Refractory) home Phenergan  25 mg Q6H PRN         - Scheduled p.o. Meclizine  25 mg q6h         - Discontinued scheduled benadryl  - Fluids Potassium chloride  in 100mL IVPB until  - AM BMP  - PT/OT to treat - Fall and delirium precautions Hypokalemia (Resolved: 12/11/2023) Likely secondary to emesis. K 3.5 today. . - continue to monitor Chronic health problem Rheumatoid arthritis: continue home Gabapentin  Enacarbil 600 mg (Horizant ) GERD: Continue Protonix  40 mg PO  Seizure disorder: pharm said locosamide not being prescribed and dc'd     FEN/GI: Clear liquid diet, advance as tolerated PPx: Lovenox  Dispo:Home pending clinical improvement . Barriers include continued N/V.   Subjective:   No acute events overnight. Vital Signs Stable. Medication compliant. Pt reports feeling better today and would like to go home on her home regimen.  Objective: Temp:  [98 F (36.7 C)-98.7 F (37.1 C)] 98.4 F (36.9 C) (09/30 2351) Pulse Rate:  [69-113] 79 (10/01 0612) Resp:  [16-18] 18 (09/30 1559) BP: (87-159)/(63-94) 133/85 (10/01 0612) SpO2:  [95 %-100 %] 99 % (10/01 0612) Physical Exam: Physical  Exam Vitals and nursing note reviewed. Exam conducted with a chaperone present.  Constitutional:      General: She is not in acute distress. Cardiovascular:     Rate and Rhythm: Normal rate and regular rhythm.     Heart sounds: No murmur heard. Pulmonary:     Effort: Pulmonary effort is normal. No respiratory distress.  Abdominal:     Palpations: Abdomen is soft.     Tenderness: There is no abdominal tenderness.  Musculoskeletal:     Right lower leg: No edema.     Left lower leg: No edema.  Skin:    General: Skin is warm and dry.  Neurological:     Mental Status: She is alert.     Laboratory: Most recent CBC Lab Results  Component Value Date   WBC 8.2 12/10/2023   HGB 14.1 12/10/2023   HCT 39.9 12/10/2023   MCV 87.9 12/10/2023   PLT 279 12/10/2023   Most recent BMP    Latest Ref Rng & Units 12/11/2023    1:22 AM  BMP  Glucose 70 - 99 mg/dL 99   BUN 8 - 23 mg/dL 5   Creatinine 9.55 - 8.99 mg/dL 9.34   Sodium 864 - 854 mmol/L 136   Potassium 3.5 - 5.1 mmol/L 3.5   Chloride 98 - 111 mmol/L 104   CO2 22 - 32 mmol/L 22   Calcium  8.9 - 10.3 mg/dL 9.1     Other pertinent labs:   Imaging/Diagnostic Tests: Radiologist  Impression: No results found.  My interpretation: n/a Kristen Ramos 12/11/2023, 7:37 AM  PGY-1, [facility] Family Medicine FPTS Intern pager: [phone], text pages welcome Secure chat group [facility] Teaching Service

## 2023-12-11 NOTE — Assessment & Plan Note (Addendum)
 Patient denies N/V, and is able to tolerate oral intake. - For N/V:        - Scheduled IV Reglan  5 mg Q6H        - Second line (Refractory) home Phenergan  25 mg Q6H PRN         - Scheduled p.o. Meclizine  25 mg q6h         - Discontinued scheduled benadryl  - Fluids Potassium chloride  in IVPB until  - AM BMP  - PT/OT to treat - Fall and delirium precautions

## 2024-01-21 NOTE — Progress Notes (Signed)
 Tobacco Cessation Follow Up Visit  Subjective:  History of Tobacco Cessation  Kristen Ramos is a 63 y.o. female referred by Dr. Leretha for discussion regarding tobacco cessation.  She has PMH of  depression, anxiety, cyclic vomiting syndrome, GERD, RA, HLD, chronic back pain, vertigo, and hx of seizures.  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 She smokes outdoors/other's cars. She is a disable naval architect. She would like to quit for her health.    Tobacco Use History She currently uses the following product(s): Cigarettes: 3/day (due to stress).               - Brand: Newport Lights 100s She started using tobacco at age 41.     Previous Tobacco Quit Attempts: She has tried the following therapies:  -Quit cold turkey from age [age]yo.    Current Status  Kristen Ramos presents to the tobacco cessation clinic for a follow up appointment (3/8). Reports to be smoking 3/cigs a week; previously was smoking 7-8 cigs/day. She has been putting cigs out and relighting them after a few puffs. Notes she has her friend around but not much as he smokes up to 2ppd. Continues on NRT-patches and feels they are really helping. But she does not find the lozenges as  helpful and only used them for first 2 weeks. Lives with her granddaughter (66 yo). Continues to smoking in the mornings only with her coffee   Readiness to Quit: Importance: on a scale of 1-10, with 1 being not important at all and 10 being absolutely essential, the patient rates the importance of tobacco cessation as a:  10. Readiness: on a scale of 1-10, with 1 being never ready to quit and 10 being ready to quit today, the patient rates his/her readiness to quit as a: 10. Patient reported the following reasons to quit smoking:  Health, family.    Triggers and Potential Barriers to Quitting: Her triggers for tobacco use include: First AM with coffee, after meals, in  the car (other's), stressed/anxious, sad/depressed, bored Barriers to quitting include: habit   Environmental Support Who else lives with you that smokes or uses tobacco? No one; friend visits, smokes 2ppd How many of your family and friends (not living with you) smoke or use tobacco? A few  Is smoking or tobacco use allowed inside your home or vehicle? No Do you smoke during your workday? Disability; was a truck driver for 25 years. -Currently single. 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  7 grand children. 7 sisters/brothers. Mom is 34 yo.    Mental Health History Do you currently or have you ever been diagnosed with any of the following?   YES/NO Current Meds/Comments  Depression  Yes,    Baby sister (age 45) passed in 2018 due to excess alcohol and opiate overdose.  Anxiety  Yes, Prozac (gained a lot of weight)      Schizophrenia  No      Bipolar Disorder  No      Eating disorder (e.g. 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 ADHD, Autism Spectrum Disorder)  No      Other:         Have you ever felt so bad that you thought about hurting yourself? If  yes, how long ago? No Are you currently being treated by a mental health provider? If yes, name of the provider and next follow up?  Yes     Alcohol and Drug Usage Denies use, quit 2012 Have you ever received treatment for alcohol or drug use or abuse?  No What is you drink of choice and how often do you drink? Denies use Use of drugs (e.g. marijuana or cocaine), other than those prescribed: No     Past Medical History   YES/NO Current Meds/Comments  Heart Disease  No      High Blood Pressue  No    Diabetes  No      Kidney/Liver Disease  No      High Cholesterol Yes, atorvastatin       Stroke  No      Cancer Type:  No      Lung Disease (asthma,  COPD)  No      Peptic Ulcer Disease  Yes, Protonix     Other:  Chronic back pain: gaba, oxy Cyclic vomiting d/o Reglan  Vertigo: meclizine  RA: remicade      Are you currently pregnant, planning a pregnancy, or breast- feeding?   No  Any allergies to medications: if yes, please list.  Allergies as of 01/21/2024 - Reviewed 01/13/2024  Allergen Reaction Noted  . Venom-honey bee Hives and Swelling 12/13/2017    Current Medications:  Current Medications[1]   Review of Systems Positive for:  anxiety Negative for:  aggressive behavior, anxiety, increased appetite, decreased appetite, tremor, difficulty concentrating, irritability, depressed mood   The following portions of the patient's history were reviewed and updated as appropriate: allergies, current medications, past family history, past medical history, past social history, past surgical history and problem list.    Objective:    Breath carbon monoxide level today: not assessed.      Assessment:    Tobacco Use/Cessation: She is assessed to be in action stage with respect to tobacco cessation.  Carbon monoxide level today not assessed.     Plan:   Quit Date: TBD   Plan -Advised patient to stop the use of tobacco products and offered support.   -Provided education regarding health risks of tobacco use. -Provided education regarding health benefits of tobacco cessation. -Discussed current tobacco use pattern. -Patient not willing to set a quit date today, but will do the following: -Continues on drug therapy with NRT- nicotine  patch 14mg /d (4-week sample). Discussed all the relevant SEs, toxicities, and schedule. Patient in agreement.   -Will keep coffee consumption to a minimum 3 days a week. Will drink more tea in its place or use dark chocolate for caffeine intake.   Advise: After discussing the negative affects of tobacco use with Kristen Ramos today I advised her to stop smoking and I let her know that it was the single most  important thing that she could do to protect her health. We talked about some of the health risks including, 1) negative impact on cancer treatment efficacy and overall survival. 2) the harmful effects of second hand smoke on the family. 3) the increased risk of stroke, diabetes and coronary heart disease.  Goals: In preparation for her quit date, I recommended she reduce the number of cigarettes per day by at least half, with the goal of not smoking at all while on NRT. If she uses NRT and continues to smoke, she may get sick from nicotine  overdose.  Get Rid of Smoking Reminders: I recommended she wash all  clothing and clean the car. Get rid of matches, ashtrays, and any cigarette butts that may be outside the home. Put quit-kit items in the places where cigarettes, ashtrays, matches, and lighters were kept. I reminded her that for many people alcohol is a trigger that makes them want to smoke. Drinking alcohol may make it more difficult to quit.    Follow-up: RTC on 12/09 at 2pm.   Thank you for allowing me to participate in the care of this patient.  Shafaq Rashid, PA-C  I spent 45 minutes for this encounter discussing the benefits of tobacco cessation and counseling.          [1]  Current Outpatient Medications:  .  cetirizine  (ZyrTEC ) 10 mg tablet, Take 10 mg by mouth., Disp: , Rfl:  .  cholecalciferol (VITAMIN D3) 2,000 unit cap capsule, Take 1 each (2,000 Units total) by mouth daily., Disp: 90 each, Rfl: 1 .  FLUoxetine (PROzac) 40 mg capsule, Take 1 capsule (40 mg total) by mouth daily., Disp: 90 capsule, Rfl: 2 .  fluticasone  propionate (FLONASE ) 50 mcg/spray nasal spray, Administer 1 spray into each nostril daily., Disp: , Rfl:  .  gabapentin  (NEURONTIN ) 600 mg tablet, TAKE 1 TABLET(600 MG) BY MOUTH TWICE DAILY, Disp: 60 tablet, Rfl: 1 .  meclizine  (ANTIVERT ) 25 mg tablet, Take 25 mg by mouth., Disp: , Rfl:  .  metoclopramide  (REGLAN ) 5 mg tablet, Take 5 mg by mouth in the  morning and 5 mg at noon and 5 mg in the evening. Take before meals., Disp: , Rfl:  .  nicotine  (NICODERM CQ ) 14 mg/24 hr patch, Place 1 patch on the skin daily., Disp: , Rfl:  .  nicotine  polacrilex (NICORETTE) 2 mg disintegrating lozenge, Dissolve 4 mg in the mouth every 8 (eight) hours., Disp: , Rfl:  .  oxyCODONE  (ROXICODONE ) 15 mg immediate release tablet, Take 15 mg by mouth every 6 (six) hours., Disp: , Rfl:  .  pantoprazole  (PROTONIX ) 40 mg EC tablet, Take 40 mg by mouth 2 (two) times a day., Disp: , Rfl:  .  sulfaSALAzine (AZULFIDINE EN-TABS) 500 mg tablet, Take 1 tablet (500 mg total) by mouth 2 (two) times a day., Disp: 60 tablet, Rfl: 5
